# Patient Record
Sex: Male | Born: 1940 | Race: White | Hispanic: No | Marital: Married | Smoking: Former smoker
Health system: Southern US, Community
[De-identification: ages and names within clinical notes are randomized; demographics above are authoritative.]

## PROBLEM LIST (undated history)

## (undated) DIAGNOSIS — M545 Low back pain, unspecified: Secondary | ICD-10-CM

## (undated) DIAGNOSIS — M4150 Other secondary scoliosis, site unspecified: Secondary | ICD-10-CM

## (undated) DIAGNOSIS — F329 Major depressive disorder, single episode, unspecified: Secondary | ICD-10-CM

## (undated) DIAGNOSIS — T4145XA Adverse effect of unspecified anesthetic, initial encounter: Secondary | ICD-10-CM

## (undated) DIAGNOSIS — B370 Candidal stomatitis: Secondary | ICD-10-CM

## (undated) DIAGNOSIS — I10 Essential (primary) hypertension: Secondary | ICD-10-CM

## (undated) DIAGNOSIS — Z973 Presence of spectacles and contact lenses: Secondary | ICD-10-CM

## (undated) DIAGNOSIS — G8929 Other chronic pain: Secondary | ICD-10-CM

## (undated) DIAGNOSIS — Z889 Allergy status to unspecified drugs, medicaments and biological substances status: Secondary | ICD-10-CM

## (undated) DIAGNOSIS — T84030A 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, initial encounter: Secondary | ICD-10-CM

## (undated) DIAGNOSIS — R7303 Prediabetes: Secondary | ICD-10-CM

## (undated) DIAGNOSIS — C61 Malignant neoplasm of prostate: Secondary | ICD-10-CM

## (undated) DIAGNOSIS — M4180 Other forms of scoliosis, site unspecified: Secondary | ICD-10-CM

## (undated) DIAGNOSIS — M0520 Rheumatoid vasculitis with rheumatoid arthritis of unspecified site: Secondary | ICD-10-CM

## (undated) DIAGNOSIS — K219 Gastro-esophageal reflux disease without esophagitis: Secondary | ICD-10-CM

## (undated) DIAGNOSIS — E7800 Pure hypercholesterolemia, unspecified: Secondary | ICD-10-CM

## (undated) DIAGNOSIS — F32A Depression, unspecified: Secondary | ICD-10-CM

## (undated) DIAGNOSIS — Z9289 Personal history of other medical treatment: Secondary | ICD-10-CM

## (undated) DIAGNOSIS — J189 Pneumonia, unspecified organism: Secondary | ICD-10-CM

## (undated) DIAGNOSIS — Z9989 Dependence on other enabling machines and devices: Secondary | ICD-10-CM

## (undated) DIAGNOSIS — R0600 Dyspnea, unspecified: Secondary | ICD-10-CM

## (undated) DIAGNOSIS — G4733 Obstructive sleep apnea (adult) (pediatric): Secondary | ICD-10-CM

## (undated) DIAGNOSIS — Z87442 Personal history of urinary calculi: Secondary | ICD-10-CM

## (undated) DIAGNOSIS — J449 Chronic obstructive pulmonary disease, unspecified: Secondary | ICD-10-CM

## (undated) HISTORY — PX: PROSTATE BIOPSY: SHX241

## (undated) HISTORY — PX: COLONOSCOPY W/ POLYPECTOMY: SHX1380

## (undated) HISTORY — PX: BACK SURGERY: SHX140

## (undated) HISTORY — DX: Essential (primary) hypertension: I10

## (undated) HISTORY — DX: Allergy status to unspecified drugs, medicaments and biological substances: Z88.9

## (undated) HISTORY — PX: TONSILLECTOMY: SUR1361

## (undated) HISTORY — DX: Pure hypercholesterolemia, unspecified: E78.00

## (undated) HISTORY — PX: FRACTURE SURGERY: SHX138

## (undated) HISTORY — DX: Candidal stomatitis: B37.0

## (undated) HISTORY — PX: JOINT REPLACEMENT: SHX530

## (undated) HISTORY — PX: HERNIA REPAIR: SHX51

## (undated) HISTORY — DX: 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, initial encounter: T84.030A

---
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
## 1984-09-27 HISTORY — PX: INGUINAL HERNIA REPAIR: SUR1180

## 1998-06-17 ENCOUNTER — Encounter: Payer: Self-pay | Admitting: *Deleted

## 1998-06-17 ENCOUNTER — Ambulatory Visit (HOSPITAL_COMMUNITY): Admission: RE | Admit: 1998-06-17 | Discharge: 1998-06-17 | Payer: Self-pay | Admitting: *Deleted

## 1999-10-01 ENCOUNTER — Encounter (HOSPITAL_COMMUNITY): Admission: RE | Admit: 1999-10-01 | Discharge: 1999-12-30 | Payer: Self-pay

## 2000-01-11 ENCOUNTER — Other Ambulatory Visit: Admission: RE | Admit: 2000-01-11 | Discharge: 2000-01-11 | Payer: Self-pay | Admitting: *Deleted

## 2000-03-08 ENCOUNTER — Ambulatory Visit (HOSPITAL_COMMUNITY): Admission: RE | Admit: 2000-03-08 | Discharge: 2000-03-08 | Payer: Self-pay | Admitting: *Deleted

## 2000-03-08 ENCOUNTER — Encounter (INDEPENDENT_AMBULATORY_CARE_PROVIDER_SITE_OTHER): Payer: Self-pay | Admitting: *Deleted

## 2000-03-23 ENCOUNTER — Encounter: Admission: RE | Admit: 2000-03-23 | Discharge: 2000-03-23 | Payer: Self-pay | Admitting: *Deleted

## 2000-03-23 ENCOUNTER — Encounter: Payer: Self-pay | Admitting: *Deleted

## 2000-04-25 ENCOUNTER — Encounter: Payer: Self-pay | Admitting: Internal Medicine

## 2000-04-25 ENCOUNTER — Ambulatory Visit (HOSPITAL_BASED_OUTPATIENT_CLINIC_OR_DEPARTMENT_OTHER): Admission: RE | Admit: 2000-04-25 | Discharge: 2000-04-25 | Payer: Self-pay

## 2001-09-29 ENCOUNTER — Encounter: Admission: RE | Admit: 2001-09-29 | Discharge: 2001-09-29 | Payer: Self-pay | Admitting: Internal Medicine

## 2001-09-29 ENCOUNTER — Encounter: Payer: Self-pay | Admitting: Internal Medicine

## 2002-09-05 ENCOUNTER — Ambulatory Visit (HOSPITAL_COMMUNITY): Admission: RE | Admit: 2002-09-05 | Discharge: 2002-09-05 | Payer: Self-pay | Admitting: Internal Medicine

## 2002-09-05 ENCOUNTER — Encounter: Payer: Self-pay | Admitting: Internal Medicine

## 2002-09-07 ENCOUNTER — Encounter: Admission: RE | Admit: 2002-09-07 | Discharge: 2002-09-07 | Payer: Self-pay | Admitting: Internal Medicine

## 2002-09-07 ENCOUNTER — Encounter: Payer: Self-pay | Admitting: Internal Medicine

## 2003-01-25 ENCOUNTER — Encounter: Admission: RE | Admit: 2003-01-25 | Discharge: 2003-01-25 | Payer: Self-pay | Admitting: Internal Medicine

## 2003-01-25 ENCOUNTER — Encounter: Payer: Self-pay | Admitting: Internal Medicine

## 2003-04-03 ENCOUNTER — Encounter (INDEPENDENT_AMBULATORY_CARE_PROVIDER_SITE_OTHER): Payer: Self-pay | Admitting: Specialist

## 2003-04-03 ENCOUNTER — Ambulatory Visit (HOSPITAL_COMMUNITY): Admission: RE | Admit: 2003-04-03 | Discharge: 2003-04-03 | Payer: Self-pay | Admitting: Gastroenterology

## 2003-09-30 ENCOUNTER — Encounter: Admission: RE | Admit: 2003-09-30 | Discharge: 2003-09-30 | Payer: Self-pay | Admitting: Internal Medicine

## 2004-08-10 ENCOUNTER — Encounter: Admission: RE | Admit: 2004-08-10 | Discharge: 2004-08-10 | Payer: Self-pay | Admitting: Internal Medicine

## 2004-10-16 ENCOUNTER — Encounter: Admission: RE | Admit: 2004-10-16 | Discharge: 2004-10-16 | Payer: Self-pay | Admitting: Internal Medicine

## 2005-01-04 ENCOUNTER — Encounter: Admission: RE | Admit: 2005-01-04 | Discharge: 2005-01-04 | Payer: Self-pay | Admitting: Internal Medicine

## 2005-07-16 ENCOUNTER — Encounter: Admission: RE | Admit: 2005-07-16 | Discharge: 2005-07-16 | Payer: Self-pay | Admitting: Internal Medicine

## 2005-11-21 ENCOUNTER — Encounter: Admission: RE | Admit: 2005-11-21 | Discharge: 2005-11-21 | Payer: Self-pay | Admitting: Internal Medicine

## 2006-01-28 ENCOUNTER — Encounter: Admission: RE | Admit: 2006-01-28 | Discharge: 2006-01-28 | Payer: Self-pay | Admitting: Neurosurgery

## 2006-02-08 ENCOUNTER — Ambulatory Visit (HOSPITAL_COMMUNITY): Admission: RE | Admit: 2006-02-08 | Discharge: 2006-02-09 | Payer: Self-pay | Admitting: Neurosurgery

## 2008-03-14 DIAGNOSIS — J189 Pneumonia, unspecified organism: Secondary | ICD-10-CM | POA: Insufficient documentation

## 2008-03-14 DIAGNOSIS — G4733 Obstructive sleep apnea (adult) (pediatric): Secondary | ICD-10-CM | POA: Insufficient documentation

## 2008-03-14 DIAGNOSIS — G2589 Other specified extrapyramidal and movement disorders: Secondary | ICD-10-CM

## 2008-03-14 DIAGNOSIS — M069 Rheumatoid arthritis, unspecified: Secondary | ICD-10-CM | POA: Insufficient documentation

## 2008-03-14 DIAGNOSIS — Z9989 Dependence on other enabling machines and devices: Secondary | ICD-10-CM

## 2008-03-14 DIAGNOSIS — J449 Chronic obstructive pulmonary disease, unspecified: Secondary | ICD-10-CM

## 2008-03-15 ENCOUNTER — Ambulatory Visit: Payer: Self-pay | Admitting: Internal Medicine

## 2008-03-15 DIAGNOSIS — E785 Hyperlipidemia, unspecified: Secondary | ICD-10-CM | POA: Insufficient documentation

## 2008-03-23 DIAGNOSIS — J309 Allergic rhinitis, unspecified: Secondary | ICD-10-CM

## 2008-04-08 ENCOUNTER — Encounter: Payer: Self-pay | Admitting: Internal Medicine

## 2008-07-17 ENCOUNTER — Encounter: Admission: RE | Admit: 2008-07-17 | Discharge: 2008-07-17 | Payer: Self-pay | Admitting: Internal Medicine

## 2008-09-27 HISTORY — PX: POSTERIOR LAMINECTOMY / DECOMPRESSION LUMBAR SPINE: SUR740

## 2009-07-31 ENCOUNTER — Encounter: Admission: RE | Admit: 2009-07-31 | Discharge: 2009-07-31 | Payer: Self-pay | Admitting: Internal Medicine

## 2011-02-12 NOTE — Op Note (Signed)
   [name], [name]                        ACCOUNT NO.:  [id number]   MEDICAL RECORD NO.:  [id number]                   PATIENT TYPE:  AMB   LOCATION:  ENDO                                 FACILITY:  [facility]   PHYSICIAN:  Earle Gell, M.D.                DATE OF BIRTH:  25-May-1941   DATE OF PROCEDURE:  04/03/2003  DATE OF DISCHARGE:                                 OPERATIVE REPORT   PROCEDURE:  Colonoscopy.   PROCEDURE INDICATION:  [name] is a 70 year old male, born  [birth year].  [name] is undergoing a diagnostic colonoscopy to  evaluate guaiac positive stool.   ENDOSCOPIST:  Garlan Fair, M.D.   PREMEDICATION:  1. Versed 5 mg.  2. Demerol 50 mg.   DESCRIPTION OF PROCEDURE:  After obtaining informed consent, [name] was  placed in the left lateral decubitus position.  I administered intravenous  Demerol and intravenous Versed to achieve conscious sedation for the  procedure.  The patient's blood pressure, oxygen saturation, and cardiac  rhythm were monitored throughout the procedure and documented in the medical  record.   Anal inspection was normal.  Digital rectal examination revealed a  nonnodular prostate.  The Olympus adult colonoscope was introduced into the  rectum and advanced to the cecum.  A normal-appearing ileocecal valve was  intubated and the distal ileum inspected.  Colonic preparation for the exam  today was excellent.   RECTUM:  Normal.  SIGMOID COLON AND DESCENDING COLON:  Left colonic diverticulosis without  diverticulitis or diverticular stricture formation.  SPLENIC FLEXURE:  Normal.  TRANSVERSE COLON:  Normal.  HEPATIC FLEXURE:  A 0.5 mm sessile polyp was removed with the cold biopsy  forceps.  ASCENDING COLON:  Normal.  CECUM AND ILEOCECAL VALVE:  Normal.  DISTAL ILEUM:  Normal.   ASSESSMENT:  1. Left colonic diverticulosis.  2. A 0.5 mm sessile polyp was removed from the hepatic flexure.   RECOMMENDATIONS:  Repeat colonoscopy in five years if hepatic flexure polyp  returns neoplastic pathologically.                                               Earle Gell, M.D.    MJ/MEDQ  D:  04/03/2003  T:  04/03/2003  Job:  [id number]   cc:   Doree Albee, M.D.  [place] [address], [address]  [place]  [place] [address]  Fax: [phone]   Michael Litter, M.D.  [address] [place]  [place] [address]  Fax: Drummond Terance Hart, M.D.  [place]. [address] [place]  [place] [address]  Fax: [phone]

## 2011-02-12 NOTE — Procedures (Signed)
[facility]  Patient:    [name], [name]                     MRN: [id number] Proc. Date: 03/08/00 Adm. Date:  [id number] Disc. Date: [id number] Attending:  Ardeth Perfect CC:         Dennie Bible. Drue Flirt, M.D.                           Procedure Report  PROCEDURE PERFORMED:  Video colonoscopy.  ENDOSCOPIST:  Knox Saliva, M.D.  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  PREPARATION:  He is n.p.o. since midnight having taken Phospho-Soda prep and a clear liquid diet yesterday.  The mucosa is clean.  DEPTH OF INSERTION:  Only midtransverse.  PREPROCEDURE SEDATION:   The patient received 125 mg Demerol and 10 mg of Versed intravenously.  In addition he was on 2L of nasal cannula O2.  DESCRIPTION OF PROCEDURE:  The Olympus video colonoscope was inserted via the rectum and advanced fairly easily to the midtransverse colon.  Beyond this point the patient could not tolerate further passage despite the administration of the final 25 mg of Demerol listed above.  He has had a prior splenectomy and has a midline abdominal scar and there probably are adhesions limiting the insertion at this point.  Extra-abdominal pressure and rotating the patient on his back were attempted to no avail.  The insertion was terminated and the scope was withdrawn.  On withdrawal, the mucosa was carefully evaluated and from mid to transverse to sigmoid appeared completely normal with the exception of mild diverticulosis.  In the rectum was a 0.6 cm polyp that was snared and recovered for histologic evaluation.  No other abnormalities were seen.  The patient tolerated the procedure well.  Pulse, bp and oximetry testing were stable throughout.  He was observed in recovery for 45 minutes and discharged home alert with a benign abdomen.ks were identified  IMPRESSION: 1. Rectal polyp. 2. Mild sigmoid  diverticulosis. 3. Incomplete colonoscopy probably secondary to adhesions from prior surgery.  PLAN:  Patient will require a full air contrast barium enema which I will schedule through my office.  RECOMMENDATIONS: DD:  03/08/00 TD:  03/10/00 Job: [id number]

## 2011-02-12 NOTE — Op Note (Signed)
[name], [name]              ACCOUNT NO.:  [id number]   MEDICAL RECORD NO.:  [id number]          PATIENT TYPE:  AMB   LOCATION:  SDS                          FACILITY:  [facility]   PHYSICIAN:  Faythe Ghee, M.D. 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   PROCEDURES:  1.  Right L2-3 and left L4-5 interlaminar laminotomy for excision of      herniated disk with operating microscope.  2.  Microdissection, L2-3 disk on the right with L3 nerve root on the right      and L4-5 disk on the left with L5 root on the left.   SURGEON:  Faythe Ghee, M.D.   ASSISTANT:  Cooper Render. Pool, M.D.   PROCEDURE IN DETAIL:  After being placed in the prone position, the  patient's back was prepped and draped in the usual sterile fashion.  A  localizing x-ray was taken prior to incision to identify the appropriate  level.  A midline incision was made below the spinous processes of L2-3 and  L4-5.  Using Bovie cutting current, the incision was carried out into the  spinous processes.  Subperiosteal dissection was then carried out on the  right side at L2-3 until the spinous processes and laminae had been exposed.  Subperiosteal dissection was then carried out at L4-5 on the left side to  expose the lamina.  Self-retaining retractor was then placed at both levels  and an x-ray was taken to confirm approach to the appropriate level.  Starting at L4-5 on the left, a high-speed drill was used to remove the  inferior one-third of the L4 lamina, the medial one-third of the facet joint  and the superior one-third of the L5 lamina.  Residual bone and ligamentum  flavum were removed in a piecemeal fashion.  A similar laminotomy was then  performed at L2-3 on the right by removing  the inferior one-third of the L2  lamina, the medial one-third of the L2 lamina, the medial one-third of the  facet joint and the superior one-half of the L3 lamina.  Once again residual  bone and ligamentum flavum were removed in a piecemeal fashion.  The  microscope was then draped, brought into the field and used for the  remainder of the case.  Starting at L2-3, microdissection technique was used  to identify the L3 nerve root and the L2-3 disk.  Inferior to the disk along  the pedicle, there was a large amount of inferior herniated disk material,  which was removed in a piecemeal fashion.  This gave excellent decompression  to the L3 nerve root.  The disk at L2-3 was then incised and thoroughly  cleaned out with pituitary rongeurs and curettes.  At this time inspection  was carried out in all directions for any evidence of residual compression  and none could be identified.  Large  amounts of irrigation were carried out.  Attention was then turned to L4-5 on the left.  Once again microdissection  technique was used to identify the L5 nerve root and L4-5 disk, which was  found to be herniated beneath the nerve root.  Once again the disk space was  incised and thoroughly cleaned out with pituitary rongeurs and curettes.  A  small amount of residual disk material was found inferior to the disk space,  and this was removed and gave excellent decompression to the L5 nerve root.  At this time inspection was carried out at this level for any evidence of  residual compression, and none could be identified.  Both levels were  irrigated one more.  Any bleeding  was controlled with bipolar coagulation and Gelfoam.  The wounds were then  closed with multiple layers of Vicryl on the muscle, fascia, subcutaneous  and subcuticular tissues, and staples were placed on the skin.  Sterile  dressings were then applied and the patient was extubated and taken to the  recovery room in stable  condition.           ______________________________  Faythe Ghee, M.D.     ROK/MEDQ  D:  02/08/2006  T:  02/08/2006  Job:  [id number]

## 2015-10-29 HISTORY — PX: TOTAL HIP ARTHROPLASTY: SHX124

## 2016-04-27 HISTORY — PX: HIP ARTHROPLASTY: SHX981

## 2016-05-28 HISTORY — PX: UMBILICAL HERNIA REPAIR: SHX196

## 2016-10-29 ENCOUNTER — Other Ambulatory Visit: Payer: Self-pay | Admitting: Neurosurgery

## 2016-10-29 DIAGNOSIS — M48062 Spinal stenosis, lumbar region with neurogenic claudication: Secondary | ICD-10-CM

## 2016-11-04 ENCOUNTER — Ambulatory Visit
Admission: RE | Admit: 2016-11-04 | Discharge: 2016-11-04 | Disposition: A | Discharge status: Home / Self Care | Payer: Medicare Other | Source: Ambulatory Visit | Attending: Neurosurgery | Admitting: Neurosurgery

## 2016-11-04 DIAGNOSIS — M48062 Spinal stenosis, lumbar region with neurogenic claudication: Secondary | ICD-10-CM

## 2016-11-04 MED ORDER — ONDANSETRON HCL 4 MG/2ML IJ SOLN: 4.0000 mg | Freq: Four times a day (QID) | INTRAMUSCULAR | Status: DC | PRN

## 2016-11-04 MED ORDER — IOPAMIDOL (ISOVUE-M 200) INJECTION 41%
15.0000 mL | Freq: Once | INTRAMUSCULAR | Status: AC
  Administered 2016-11-04: 15 mL via INTRATHECAL

## 2016-11-04 MED ORDER — DIAZEPAM 5 MG PO TABS
5.0000 mg | ORAL_TABLET | Freq: Once | ORAL | Status: AC
  Administered 2016-11-04: 5 mg via ORAL

## 2016-11-04 NOTE — Progress Notes (Signed)
Patient states he has been off Duloxetine, Tramadol and Trazodone for at least the past two days.  jkl

## 2016-11-04 NOTE — Discharge Instructions (Signed)
Myelogram Discharge Instructions  1. Go home and rest quietly for the next 24 hours.  It is important to lie flat for the next 24 hours.  Get up only to go to the restroom.  You may lie in the bed or on a couch on your back, your stomach, your left side or your right side.  You may have one pillow under your head.  You may have pillows between your knees while you are on your side or under your knees while you are on your back.  2. DO NOT drive today.  Recline the seat as far back as it will go, while still wearing your seat belt, on the way home.  3. You may get up to go to the bathroom as needed.  You may sit up for 10 minutes to eat.  You may resume your normal diet and medications unless otherwise indicated.  Drink plenty of extra fluids today and tomorrow.  4. The incidence of a spinal headache with nausea and/or vomiting is about 5% (one in 20 patients).  If you develop a headache, lie flat and drink plenty of fluids until the headache goes away.  Caffeinated beverages may be helpful.  If you develop severe nausea and vomiting or a headache that does not go away with flat bed rest, call [phone].  5. You may resume normal activities after your 24 hours of bed rest is over; however, do not exert yourself strongly or do any heavy lifting tomorrow.  6. Call your physician for a follow-up appointment.    You may resume Duloxetine, Tramadol and Trazodone on Friday, February, 9, 2018 after 9:30a.m.

## 2016-11-17 ENCOUNTER — Encounter: Payer: Self-pay | Admitting: Pulmonary Disease

## 2016-11-17 ENCOUNTER — Ambulatory Visit (INDEPENDENT_AMBULATORY_CARE_PROVIDER_SITE_OTHER): Payer: Medicare Other | Admitting: Pulmonary Disease

## 2016-11-17 DIAGNOSIS — Z9081 Acquired absence of spleen: Secondary | ICD-10-CM | POA: Insufficient documentation

## 2016-11-17 DIAGNOSIS — F17210 Nicotine dependence, cigarettes, uncomplicated: Secondary | ICD-10-CM | POA: Diagnosis not present

## 2016-11-17 DIAGNOSIS — J42 Unspecified chronic bronchitis: Secondary | ICD-10-CM | POA: Diagnosis not present

## 2016-11-17 NOTE — Assessment & Plan Note (Signed)
He has moderate airflow obstruction as seen on October 2015 or function testing performed in [place].  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  He is at increased risk for recurrent bacterial infections considering the fact that he had a splenectomy.   Plan: Continue Breo Continue Spiriva Immunizations are up-to-date Albuterol as needed

## 2016-11-17 NOTE — Assessment & Plan Note (Signed)
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 He tells me that he has had both pneumonia vaccines.

## 2016-11-17 NOTE — Patient Instructions (Signed)
Keep taking your Breo and Spiriva as you are doing  Keep using CPAP every night We will plan on seeing you back in 6 months or sooner if needed We will refer you to the lung cancer screening program

## 2016-11-17 NOTE — Addendum Note (Signed)
Addended by: Len Blalock on: 11/17/2016 04:32 PM   Modules accepted: Orders

## 2016-11-17 NOTE — Progress Notes (Signed)
Subjective:    [name], male    DOB: [birth year], 76 y.o.   MRN: [id number]  Synopsis: Referred for COPD in 2018. He has a history of tobacco repeat abuse and smoked 2 packs of cigarettes daily for 52 years, quit in October 2016. 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 He also has rheumatoid arthritis.  HPI Chief Complaint  Patient presents with  . Pulmonary Consult    dyspnea, from [place], [place] and needs to establish care, dx'd with COPD from previous pulmonologist, dx'd with sleep apnea, uses CPAP, doesnt have a DME here,     [name] is here to establish care with a pulmonary doctors.  He feels like overall his care has been OK.  He tells me that he is currently taking antibiotics for pneumonia.  He was diagnosed with this in [facility] after he saw his PCP who ordered a CXR.  He is on day 6 of 10 of antibiotics.  He is also taking prednisone as well.  He feels tired, but much better than he was 3 days ago.   He quit smoking in 2016 after smoking 2 ppd for 52 years.    No hospitalizations for COPD in the past, though he has had pneumonia several times.  He has had both forms of the pneumonia vaccine, he believes.    [place] live in an assisted living facility.  He has had a sinus infection since moving here and was treated with an antibiotic. He has persistent sinus congestion.  He uses CPAP nightly, it helps him rest well and he doesn't feel fatigued.  He has used it for 10-12 years, never needed the pressure adjusted.   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  He takes spiriva and breo.     Past Medical History:  Diagnosis Date  . Cancer of prostate ([place])   . H/O seasonal allergies   . High cholesterol   . Hypertension   . Sleep apnea      Family History  Problem Relation Age of Onset  . Cancer Mother   . Heart disease Father      Social History   Social History  . Marital status: Married    Spouse  name: N/A  . Number of children: N/A  . Years of education: N/A   Occupational History  . Not on file.   Social History Main Topics  . Smoking status: Former Smoker    Types: Cigarettes    Quit date: 09/27/2014  . Smokeless tobacco: Never Used  . Alcohol use Not on file  . Drug use: Unknown  . Sexual activity: Not on file   Other Topics Concern  . Not on file   Social History Narrative  . No narrative on file     Allergies  Allergen Reactions  . Sulfa Antibiotics      Outpatient Medications Prior to Visit  Medication Sig Dispense Refill  . DULoxetine (CYMBALTA) 60 MG capsule Take 60 mg by mouth daily.    . fluticasone furoate-vilanterol (BREO ELLIPTA) 100-25 MCG/INH AEPB Inhale 1 puff into the lungs daily.    . folic acid (FOLVITE) 1 MG tablet Take 1 mg by mouth daily.    Marland Kitchen gabapentin (NEURONTIN) 300 MG capsule Take 600 mg by mouth 3 (three) times daily.    . hydroxychloroquine (PLAQUENIL) 200 MG tablet Take 200 mg by mouth daily.    Marland Kitchen  ibuprofen (ADVIL,MOTRIN) 400 MG tablet Take 400 mg by mouth 3 (three) times daily.    . InFLIXimab (REMICADE IV) Inject into the vein.    Marland Kitchen lisinopril (PRINIVIL,ZESTRIL) 10 MG tablet Take 10 mg by mouth daily.    . methotrexate 2.5 MG tablet Take 20 mg by mouth once a week.    . metoprolol tartrate (LOPRESSOR) 25 MG tablet Take 25 mg by mouth 2 (two) times daily.    . prednisoLONE 5 MG TABS tablet Take by mouth daily.    . simvastatin (ZOCOR) 40 MG tablet Take 40 mg by mouth daily at 6 PM.    . tamsulosin (FLOMAX) 0.4 MG CAPS capsule Take 0.4 mg by mouth daily.    . Tiotropium Bromide Monohydrate (SPIRIVA RESPIMAT) 2.5 MCG/ACT AERS Inhale 2 puffs into the lungs daily.    . traMADol (ULTRAM) 50 MG tablet Take 50 mg by mouth every 6 (six) hours as needed.    . traZODone (DESYREL) 50 MG tablet Take 50 mg by mouth at bedtime.     No facility-administered medications prior to visit.       Review of Systems  Constitutional: Positive for  fatigue. Negative for chills and fever.  HENT: Negative for nosebleeds, postnasal drip, rhinorrhea, sinus pain and sinus pressure.   Respiratory: Positive for cough and shortness of breath. Negative for wheezing.   Cardiovascular: Positive for leg swelling. Negative for chest pain and palpitations.  Gastrointestinal: Negative for abdominal pain, constipation and diarrhea.  Endocrine: Negative for polydipsia and polyphagia.  Genitourinary: Negative for frequency and urgency.  Musculoskeletal: Positive for arthralgias and back pain. Negative for joint swelling and myalgias.  Neurological: Negative for seizures, speech difficulty, light-headedness and headaches.       Objective:   Physical Exam Vitals:   11/17/16 1548  BP: 122/72  Pulse: 87  SpO2: 92%  Weight: 241 lb (109.3 kg)  Height: _0  (1.727 m)   RA  Gen: obese, chronically ill appearing, no acute distress HENT: NCAT, OP clear, neck supple without masses Eyes: PERRL, EOMi Lymph: no cervical lymphadenopathy PULM: Crackles R base, normal effort CV: RRR, no mgr, no JVD GI: BS+, soft, nontender, no hsm Derm: no rash or skin breakdown MSK: normal bulk and tone Neuro: A&Ox4, CN II-XII intact, strength 5/5 in all 4 extremities Psyche: normal mood and affect   [facility] [place] reviewed: He was listed as having rheumatoid arthritis and COPD and sleep apnea. Apparently he quit smoking in 2017 and then used electronic cigarettes afterwards  Pulmonary function testing: October 2015 from [facility] ratio 58% FEV1 1.54 L 57% predicted, FVC 2.65 L 70% predicted, total lung capacity 157% predicted residual volume 263% predicted DLCO 61% predicted     Assessment & Plan:  Cigarette smoker He smoked 2 packs of cigarettes daily for 52 years and quit in 2016. He is at increased risk for lung cancer. I encouraged him to stay way from cigarettes. Avoid vaping. Will refer to lung  cancer screening program.  COPD (chronic obstructive pulmonary disease) ([place]) He has moderate airflow obstruction as seen on October 2015 or function testing performed in [place].  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  He is at increased risk for recurrent bacterial infections considering the fact that he had a splenectomy.   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 He tells me that he has had both pneumonia vaccines.    Current Outpatient Prescriptions:  .  DULoxetine (CYMBALTA) 60 MG capsule, Take 60 mg by mouth daily., Disp: , Rfl:  .  fluticasone furoate-vilanterol (BREO ELLIPTA) 100-25 MCG/INH AEPB, Inhale 1 puff into the lungs daily., Disp: , Rfl:  .  folic acid (FOLVITE) 1 MG tablet, Take 1 mg by mouth daily., Disp: , Rfl:  .  gabapentin (NEURONTIN) 300 MG capsule, Take 600 mg by mouth 3 (three) times daily., Disp: , Rfl:  .  hydroxychloroquine (PLAQUENIL) 200 MG tablet, Take 200 mg by mouth daily., Disp: , Rfl:  .  ibuprofen (ADVIL,MOTRIN) 400 MG tablet, Take 400 mg by mouth 3 (three) times daily., Disp: , Rfl:  .  InFLIXimab (REMICADE IV), Inject into the vein., Disp: , Rfl:  .  lisinopril (PRINIVIL,ZESTRIL) 10 MG tablet, Take 10 mg by mouth daily., Disp: , Rfl:  .  methotrexate 2.5 MG tablet, Take 20 mg by mouth once a week., Disp: , Rfl:  .  metoprolol tartrate (LOPRESSOR) 25 MG tablet, Take 25 mg by mouth 2 (two) times daily., Disp: , Rfl:  .  prednisoLONE 5 MG TABS tablet, Take by mouth daily., Disp: , Rfl:  .  simvastatin (ZOCOR) 40 MG tablet, Take 40 mg by mouth daily at 6 PM., Disp: , Rfl:  .  tamsulosin (FLOMAX) 0.4 MG CAPS capsule, Take 0.4 mg by mouth daily., Disp: , Rfl:  .  Tiotropium Bromide Monohydrate (SPIRIVA RESPIMAT) 2.5 MCG/ACT AERS, Inhale 2 puffs into the lungs daily., Disp: , Rfl:  .   traMADol (ULTRAM) 50 MG tablet, Take 50 mg by mouth every 6 (six) hours as needed., Disp: , Rfl:  .  traZODone (DESYREL) 50 MG tablet, Take 50 mg by mouth at bedtime., Disp: , Rfl:

## 2016-11-17 NOTE — Assessment & Plan Note (Signed)
He smoked 2 packs of cigarettes daily for 52 years and quit in 2016. He is at increased risk for lung cancer. I encouraged him to stay way from cigarettes. Avoid vaping. Will refer to lung cancer screening program.

## 2016-11-19 ENCOUNTER — Telehealth: Payer: Self-pay | Admitting: Acute Care

## 2016-11-23 NOTE — Telephone Encounter (Signed)
Spoke with pt and scheduled for [facility] 12/13/16 at 3:00 CT ordered Nothing further needed

## 2016-12-13 ENCOUNTER — Ambulatory Visit (INDEPENDENT_AMBULATORY_CARE_PROVIDER_SITE_OTHER): Payer: Medicare Other | Admitting: Acute Care

## 2016-12-13 ENCOUNTER — Encounter: Payer: Self-pay | Admitting: Acute Care

## 2016-12-13 ENCOUNTER — Other Ambulatory Visit: Payer: Self-pay | Admitting: Acute Care

## 2016-12-13 DIAGNOSIS — Z87891 Personal history of nicotine dependence: Secondary | ICD-10-CM | POA: Diagnosis not present

## 2016-12-13 NOTE — Progress Notes (Signed)
Shared Decision Making Visit Lung Cancer Screening Program [phone])   Eligibility:  Age 76 y.o.  Pack Years Smoking History Calculation 81 pack year smoking history (# packs/per year x # years smoked)  Recent History of coughing up blood  no  Unexplained weight loss? no ( >Than 15 pounds within the last 6 months )  Prior History Lung / other cancer no (Diagnosis within the last 5 years already requiring surveillance chest CT Scans).  Smoking Status Former Smoker  Former Smokers: Years since quit: 2 years  Quit Date: 2016  Visit Components:  Discussion included one or more decision making aids. yes  Discussion included risk/benefits of screening. yes  Discussion included potential follow up diagnostic testing for abnormal scans. yes  Discussion included meaning and risk of over diagnosis. yes  Discussion included meaning and risk of False Positives. yes  Discussion included meaning of total radiation exposure. yes  Counseling Included:  Importance of adherence to annual lung cancer LDCT screening. yes  Impact of comorbidities on ability to participate in the program. yes  Ability and willingness to under diagnostic treatment. yes  Smoking Cessation Counseling:  Current Smokers:   Discussed importance of smoking cessation. no  Information about tobacco cessation classes and interventions provided to patient. yes  Patient provided with "ticket" for LDCT Scan. yes  Symptomatic Patient. no  Counseling  Diagnosis Code: Tobacco Use Z72.0  Asymptomatic Patient yes  Counseling (Intermediate counseling: > three minutes counseling) G9562  Former Smokers:   Discussed the importance of maintaining cigarette abstinence. yes  Diagnosis Code: Personal History of Nicotine Dependence. Z30.865  Information about tobacco cessation classes and interventions provided to patient. Yes  Patient provided with "ticket" for LDCT Scan. yes  Written Order for Lung Cancer  Screening with LDCT placed in Epic. Yes (CT Chest Lung Cancer Screening Low Dose W/O CM) [id number] Z12.2-Screening of respiratory organs Z87.891-Personal history of nicotine dependence    I spent 25 minutes of face to face time with [name] discussing the risks and benefits of lung cancer screening. We viewed a power point together that explained in detail the above noted topics. We took the time to pause the power point at intervals to allow for questions to be asked and answered to ensure understanding. We discussed that he had taken the single most powerful action possible to decrease his risk of developing lung cancer when he quit smoking. I counseled him to remain smoke free, and to contact me if he ever had the desire to smoke again so that I can provide resources and tools to help support the effort to remain smoke free. We discussed the time and location of the scan, and that either Doroteo Glassman RN or I will call with the results within  24-48 hours of receiving them. He was offered   my card and contact information in the event he needs to speak with me, in addition to a copy of the power point we reviewed as a resource. He verbalized understanding of all of the above and had no further questions upon leaving the office.   We discussed that there has been a high incidence of CAD noted on these scans. I explained that in this non-gated exam degree or severity cannot be determined.He is currently on statin therapy per his PCP , and states that this is not a new diagnosis for him. He verbalized understanding of th above.  We discussed continued smoking cessation for 2-3 minutes of this appointment  [name]  Michael Confer, NP 12/13/2016

## 2016-12-16 ENCOUNTER — Ambulatory Visit (INDEPENDENT_AMBULATORY_CARE_PROVIDER_SITE_OTHER): Payer: Medicare Other

## 2016-12-16 DIAGNOSIS — Z122 Encounter for screening for malignant neoplasm of respiratory organs: Secondary | ICD-10-CM | POA: Diagnosis not present

## 2016-12-16 DIAGNOSIS — I700 Atherosclerosis of aorta: Secondary | ICD-10-CM

## 2016-12-16 DIAGNOSIS — Z87891 Personal history of nicotine dependence: Secondary | ICD-10-CM | POA: Diagnosis not present

## 2016-12-22 ENCOUNTER — Other Ambulatory Visit: Payer: Self-pay | Admitting: Acute Care

## 2016-12-22 ENCOUNTER — Other Ambulatory Visit: Payer: Self-pay | Admitting: Neurosurgery

## 2016-12-22 DIAGNOSIS — Z87891 Personal history of nicotine dependence: Secondary | ICD-10-CM

## 2017-01-17 ENCOUNTER — Telehealth: Payer: Self-pay | Admitting: Pulmonary Disease

## 2017-01-17 NOTE — Telephone Encounter (Signed)
lmom tcb x1

## 2017-01-18 MED ORDER — TIOTROPIUM BROMIDE MONOHYDRATE 2.5 MCG/ACT IN AERS: 2.0000 | INHALATION_SPRAY | Freq: Every day | RESPIRATORY_TRACT | 3 refills | Status: DC

## 2017-01-18 MED ORDER — FLUTICASONE FUROATE-VILANTEROL 100-25 MCG/INH IN AEPB: 1.0000 | INHALATION_SPRAY | Freq: Every day | RESPIRATORY_TRACT | 3 refills | Status: DC

## 2017-01-18 NOTE — Telephone Encounter (Signed)
Rx sent to preferred pharmacy for 90 day supply. Pt aware and voiced his understanding. Nothing further needed.

## 2017-01-18 NOTE — Telephone Encounter (Signed)
Patient returned call [phone].  States the message left he could not understand as it was "broken up". He will leave at 9:20 and be back later on. May try leaving message again if cannot reach him.

## 2017-01-25 DIAGNOSIS — T8859XA Other complications of anesthesia, initial encounter: Secondary | ICD-10-CM

## 2017-01-25 HISTORY — DX: Other complications of anesthesia, initial encounter: T88.59XA

## 2017-02-08 NOTE — Pre-Procedure Instructions (Signed)
[name]  02/08/2017      [facility] - [place], [place] - [address] [place] [address] Phone: [phone] Fax: [phone]    Your procedure is scheduled on Thurs. May 24  Report to [facility] Admitting at 5:30 A.M.  Call this number if you have problems the morning of surgery:  [phone]   Remember:  Do not eat food or drink liquids after midnight on Wed. May 23   Take these medicines the morning of surgery with A SIP OF WATER : albuterol inhaler if needed-bring to hospital,cymbalta, finasteride (proscar), flonase nasal spray, breo-bring to hospital, gabapentin (neurontin), hydroxychloroquine (plaquenil), omeprazole (prilosec), prednisolone, sodium chloride nasal spray, tamsulosin (flomax), spiriva-bring to hospital, tramadol if needed            1 week prior to surgery stop:aspirin, advil, aleve, ibuprofen, motrin, BC Powders, Goody's, vitamins/herbal medicines.   Do not wear jewelry.  Do not wear lotions, powders, or perfumes, or deoderant.  Do not shave 48 hours prior to surgery.  Men may shave face and neck.  Do not bring valuables to the hospital.  [facility] is not responsible for any belongings or valuables.  Contacts, dentures or bridgework may not be worn into surgery.  Leave your suitcase in the car.  After surgery it may be brought to your room.  For patients admitted to the hospital, discharge time will be determined by your treatment team.  Patients discharged the day of surgery will not be allowed to drive home.    Special instructions:   - Preparing For Surgery  Before surgery, you can play an important role. Because skin is not sterile, your skin needs to be as free of germs as possible. You can reduce the number of germs on your skin by washing with CHG (chlorahexidine gluconate) Soap before surgery.  CHG is an antiseptic cleaner which kills germs and bonds with the skin to continue  killing germs even after washing.  Please do not use if you have an allergy to CHG or antibacterial soaps. If your skin becomes reddened/irritated stop using the CHG.  Do not shave (including legs and underarms) for at least 48 hours prior to first CHG shower. It is OK to shave your face.  Please follow these instructions carefully.   1. Shower the NIGHT BEFORE SURGERY and the MORNING OF SURGERY with CHG.   2. If you chose to wash your hair, wash your hair first as usual with your normal shampoo.  3. After you shampoo, rinse your hair and body thoroughly to remove the shampoo.  4. Use CHG as you would any other liquid soap. You can apply CHG directly to the skin and wash gently with a scrungie or a clean washcloth.   5. Apply the CHG Soap to your body ONLY FROM THE NECK DOWN.  Do not use on open wounds or open sores. Avoid contact with your eyes, ears, mouth and genitals (private parts). Wash genitals (private parts) with your normal soap.  6. Wash thoroughly, paying special attention to the area where your surgery will be performed.  7. Thoroughly rinse your body with warm water from the neck down.  8. DO NOT shower/wash with your normal soap after using and rinsing off the CHG Soap.  9. Pat yourself dry with a CLEAN TOWEL.   10. Wear CLEAN PAJAMAS   11. Place CLEAN SHEETS on your bed the night of your  first shower and DO NOT SLEEP WITH PETS.    Day of Surgery: Do not apply any deodorants/lotions. Please wear clean clothes to the hospital/surgery center.      Please read over the following fact sheets that you were given. Coughing and Deep Breathing, MRSA Information and Surgical Site Infection Prevention

## 2017-02-09 ENCOUNTER — Encounter (HOSPITAL_COMMUNITY): Payer: Self-pay

## 2017-02-09 ENCOUNTER — Encounter (HOSPITAL_COMMUNITY)
Admission: RE | Admit: 2017-02-09 | Discharge: 2017-02-09 | Disposition: A | Discharge status: Home / Self Care | Payer: Medicare Other | Source: Ambulatory Visit | Attending: Neurosurgery | Admitting: Neurosurgery

## 2017-02-09 DIAGNOSIS — Z01818 Encounter for other preprocedural examination: Secondary | ICD-10-CM | POA: Insufficient documentation

## 2017-02-09 DIAGNOSIS — Z01812 Encounter for preprocedural laboratory examination: Secondary | ICD-10-CM | POA: Diagnosis not present

## 2017-02-09 DIAGNOSIS — I444 Left anterior fascicular block: Secondary | ICD-10-CM | POA: Diagnosis not present

## 2017-02-09 DIAGNOSIS — Z0183 Encounter for blood typing: Secondary | ICD-10-CM | POA: Diagnosis not present

## 2017-02-09 DIAGNOSIS — M419 Scoliosis, unspecified: Secondary | ICD-10-CM | POA: Insufficient documentation

## 2017-02-09 HISTORY — DX: Gastro-esophageal reflux disease without esophagitis: K21.9

## 2017-02-09 HISTORY — DX: Chronic obstructive pulmonary disease, unspecified: J44.9

## 2017-02-09 HISTORY — DX: Major depressive disorder, single episode, unspecified: F32.9

## 2017-02-09 HISTORY — DX: Personal history of urinary calculi: Z87.442

## 2017-02-09 HISTORY — DX: Rheumatoid vasculitis with rheumatoid arthritis of unspecified site: M05.20

## 2017-02-09 HISTORY — DX: Dyspnea, unspecified: R06.00

## 2017-02-09 HISTORY — DX: Depression, unspecified: F32.A

## 2017-02-09 LAB — TYPE AND SCREEN
ABO/RH(D): A POS
ANTIBODY SCREEN: NEGATIVE

## 2017-02-09 LAB — BASIC METABOLIC PANEL
Anion gap: 7 (ref 5–15)
BUN: 10 mg/dL (ref 6–20)
CALCIUM: 9.1 mg/dL (ref 8.9–10.3)
CO2: 29 mmol/L (ref 22–32)
CREATININE: 0.96 mg/dL (ref 0.61–1.24)
Chloride: 100 mmol/L — ABNORMAL LOW (ref 101–111)
GFR calc Af Amer: >60 mL/min (ref >60)
Glucose, Bld: 108 mg/dL — ABNORMAL HIGH (ref 65–99)
Potassium: 4.4 mmol/L (ref 3.5–5.1)
SODIUM: 136 mmol/L (ref 135–145)

## 2017-02-09 LAB — SURGICAL PCR SCREEN
MRSA, PCR: NEGATIVE
STAPHYLOCOCCUS AUREUS: NEGATIVE

## 2017-02-09 LAB — CBC
HCT: 40.6 % (ref 39.0–52.0)
Hemoglobin: 13.6 g/dL (ref 13.0–17.0)
MCH: 37 pg — AB (ref 26.0–34.0)
MCHC: 33.5 g/dL (ref 30.0–36.0)
MCV: 110.3 fL — ABNORMAL HIGH (ref 78.0–100.0)
PLATELETS: 275 10*3/uL (ref 150–400)
RBC: 3.68 MIL/uL — AB (ref 4.22–5.81)
RDW: 14.3 % (ref 11.5–15.5)
WBC: 9.7 10*3/uL (ref 4.0–10.5)

## 2017-02-09 LAB — ABO/RH: ABO/RH(D): A POS

## 2017-02-09 NOTE — Progress Notes (Signed)
PCP: Dr. Antony Salmon @ [address] in [place] (pt. Relocated Jan. 2018 from [place], [place] Pulm: Dr. Simonne Maffucci  Last sleep study>10 yrs.  Will request ekg/notes from new [facility]--Dr. Richarda Blade in [place], [place]

## 2017-02-16 NOTE — Anesthesia Preprocedure Evaluation (Addendum)
Anesthesia Evaluation  Patient identified by MRN, date of birth, ID band Patient awake    Reviewed: Allergy & Precautions, H&P , Patient's Chart, lab work & pertinent test results, reviewed documented beta blocker date and time   Airway Mallampati: II  TM Distance: >3 FB Neck ROM: full    Dental no notable dental hx. (+) Teeth Intact, Dental Advisory Given   Pulmonary shortness of breath and with exertion, sleep apnea and Continuous Positive Airway Pressure Ventilation , COPD,  COPD inhaler, former smoker,    Pulmonary exam normal breath sounds clear to auscultation       Cardiovascular hypertension, Pt. on medications and Pt. on home beta blockers  Rhythm:regular Rate:Normal     Neuro/Psych PSYCHIATRIC DISORDERS Depression    GI/Hepatic   Endo/Other  Morbid obesity  Renal/GU      Musculoskeletal  (+) Arthritis , Rheumatoid disorders,    Abdominal   Peds  Hematology   Anesthesia Other Findings   Reproductive/Obstetrics                           Anesthesia Physical Anesthesia Plan  ASA: II  Anesthesia Plan: General   Post-op Pain Management:    Induction: Intravenous  Airway Management Planned: Oral ETT  Additional Equipment:   Intra-op Plan:   Post-operative Plan: Extubation in OR  Informed Consent: I have reviewed the patients History and Physical, chart, labs and discussed the procedure including the risks, benefits and alternatives for the proposed anesthesia with the patient or authorized representative who has indicated his/her understanding and acceptance.   Dental Advisory Given  Plan Discussed with: CRNA and Surgeon  Anesthesia Plan Comments: ( IV x 2; consider a-line for blood draws  Renew T&S if expired )        Anesthesia Quick Evaluation

## 2017-02-17 ENCOUNTER — Inpatient Hospital Stay (HOSPITAL_COMMUNITY): Payer: Medicare Other

## 2017-02-17 ENCOUNTER — Encounter (HOSPITAL_COMMUNITY)
Admission: RE | Disposition: A | Discharge status: Skilled Nursing Facility | Payer: Self-pay | Source: Ambulatory Visit | Attending: Neurosurgery

## 2017-02-17 ENCOUNTER — Inpatient Hospital Stay (HOSPITAL_COMMUNITY)
Admission: RE | Admit: 2017-02-17 | Discharge: 2017-02-22 | DRG: 453 | Disposition: A | Discharge status: Skilled Nursing Facility | Payer: Medicare Other | Source: Ambulatory Visit | Attending: Neurosurgery | Admitting: Neurosurgery

## 2017-02-17 ENCOUNTER — Encounter (HOSPITAL_COMMUNITY): Payer: Self-pay | Admitting: *Deleted

## 2017-02-17 ENCOUNTER — Inpatient Hospital Stay (HOSPITAL_COMMUNITY): Payer: Medicare Other | Admitting: Emergency Medicine

## 2017-02-17 DIAGNOSIS — G4733 Obstructive sleep apnea (adult) (pediatric): Secondary | ICD-10-CM | POA: Diagnosis present

## 2017-02-17 DIAGNOSIS — M48062 Spinal stenosis, lumbar region with neurogenic claudication: Secondary | ICD-10-CM | POA: Diagnosis present

## 2017-02-17 DIAGNOSIS — F17290 Nicotine dependence, other tobacco product, uncomplicated: Secondary | ICD-10-CM | POA: Diagnosis present

## 2017-02-17 DIAGNOSIS — Z9081 Acquired absence of spleen: Secondary | ICD-10-CM

## 2017-02-17 DIAGNOSIS — G960 Cerebrospinal fluid leak: Secondary | ICD-10-CM | POA: Diagnosis not present

## 2017-02-17 DIAGNOSIS — R0902 Hypoxemia: Secondary | ICD-10-CM | POA: Diagnosis present

## 2017-02-17 DIAGNOSIS — Z7952 Long term (current) use of systemic steroids: Secondary | ICD-10-CM

## 2017-02-17 DIAGNOSIS — G4730 Sleep apnea, unspecified: Secondary | ICD-10-CM | POA: Diagnosis present

## 2017-02-17 DIAGNOSIS — M4316 Spondylolisthesis, lumbar region: Secondary | ICD-10-CM | POA: Diagnosis present

## 2017-02-17 DIAGNOSIS — Z882 Allergy status to sulfonamides status: Secondary | ICD-10-CM | POA: Diagnosis not present

## 2017-02-17 DIAGNOSIS — Z79899 Other long term (current) drug therapy: Secondary | ICD-10-CM | POA: Diagnosis not present

## 2017-02-17 DIAGNOSIS — M069 Rheumatoid arthritis, unspecified: Secondary | ICD-10-CM | POA: Diagnosis present

## 2017-02-17 DIAGNOSIS — Z419 Encounter for procedure for purposes other than remedying health state, unspecified: Secondary | ICD-10-CM

## 2017-02-17 DIAGNOSIS — M5116 Intervertebral disc disorders with radiculopathy, lumbar region: Secondary | ICD-10-CM | POA: Diagnosis present

## 2017-02-17 DIAGNOSIS — J9811 Atelectasis: Secondary | ICD-10-CM | POA: Diagnosis not present

## 2017-02-17 DIAGNOSIS — Z96642 Presence of left artificial hip joint: Secondary | ICD-10-CM | POA: Diagnosis present

## 2017-02-17 DIAGNOSIS — Z888 Allergy status to other drugs, medicaments and biological substances status: Secondary | ICD-10-CM

## 2017-02-17 DIAGNOSIS — J449 Chronic obstructive pulmonary disease, unspecified: Secondary | ICD-10-CM | POA: Diagnosis present

## 2017-02-17 DIAGNOSIS — Z8546 Personal history of malignant neoplasm of prostate: Secondary | ICD-10-CM

## 2017-02-17 DIAGNOSIS — J9621 Acute and chronic respiratory failure with hypoxia: Secondary | ICD-10-CM | POA: Diagnosis not present

## 2017-02-17 DIAGNOSIS — Z6837 Body mass index (BMI) 37.0-37.9, adult: Secondary | ICD-10-CM

## 2017-02-17 DIAGNOSIS — M51369 Other intervertebral disc degeneration, lumbar region without mention of lumbar back pain or lower extremity pain: Secondary | ICD-10-CM | POA: Diagnosis present

## 2017-02-17 DIAGNOSIS — I10 Essential (primary) hypertension: Secondary | ICD-10-CM | POA: Diagnosis present

## 2017-02-17 DIAGNOSIS — M5136 Other intervertebral disc degeneration, lumbar region: Secondary | ICD-10-CM | POA: Diagnosis present

## 2017-02-17 DIAGNOSIS — M4690 Unspecified inflammatory spondylopathy, site unspecified: Secondary | ICD-10-CM | POA: Diagnosis present

## 2017-02-17 DIAGNOSIS — K219 Gastro-esophageal reflux disease without esophagitis: Secondary | ICD-10-CM | POA: Diagnosis present

## 2017-02-17 DIAGNOSIS — F329 Major depressive disorder, single episode, unspecified: Secondary | ICD-10-CM | POA: Diagnosis present

## 2017-02-17 DIAGNOSIS — G9741 Accidental puncture or laceration of dura during a procedure: Secondary | ICD-10-CM | POA: Diagnosis not present

## 2017-02-17 DIAGNOSIS — M419 Scoliosis, unspecified: Secondary | ICD-10-CM | POA: Diagnosis present

## 2017-02-17 HISTORY — PX: MAXIMUM ACCESS (MAS)POSTERIOR LUMBAR INTERBODY FUSION (PLIF) 3 LEVEL: SHX6370

## 2017-02-17 SURGERY — POSTERIOR LUMBAR FUSION 3 LEVEL
Anesthesia: General

## 2017-02-17 MED ORDER — PHENYLEPHRINE HCL 10 MG/ML IJ SOLN
INTRAVENOUS | Status: DC | PRN
  Administered 2017-02-17: 20 ug/min via INTRAVENOUS

## 2017-02-17 MED ORDER — SURGIFOAM 100 EX MISC
CUTANEOUS | Status: DC | PRN
  Administered 2017-02-17: 07:00:00 via TOPICAL

## 2017-02-17 MED ORDER — VANCOMYCIN HCL 1000 MG IV SOLR
INTRAVENOUS | Status: DC | PRN
  Administered 2017-02-17: 1000 mg

## 2017-02-17 MED ORDER — ONDANSETRON HCL 4 MG/2ML IJ SOLN
INTRAMUSCULAR | Status: AC
  Filled 2017-02-17: qty 2

## 2017-02-17 MED ORDER — MORPHINE SULFATE (PF) 4 MG/ML IV SOLN
4.0000 mg | INTRAVENOUS | Status: DC | PRN
  Administered 2017-02-17 – 2017-02-20 (×5): 4 mg via INTRAVENOUS
  Filled 2017-02-17 (×6): qty 1

## 2017-02-17 MED ORDER — ONDANSETRON HCL 4 MG/2ML IJ SOLN: 4.0000 mg | Freq: Four times a day (QID) | INTRAMUSCULAR | Status: DC | PRN

## 2017-02-17 MED ORDER — PROPOFOL 10 MG/ML IV BOLUS
INTRAVENOUS | Status: DC | PRN
  Administered 2017-02-17: 180 mg via INTRAVENOUS

## 2017-02-17 MED ORDER — SIMVASTATIN 40 MG PO TABS
40.0000 mg | ORAL_TABLET | Freq: Every day | ORAL | Status: DC
  Administered 2017-02-18 – 2017-02-21 (×4): 40 mg via ORAL
  Filled 2017-02-17 (×4): qty 1

## 2017-02-17 MED ORDER — CEFAZOLIN SODIUM-DEXTROSE 2-4 GM/100ML-% IV SOLN
2.0000 g | INTRAVENOUS | Status: AC
  Administered 2017-02-17 (×2): 2 g via INTRAVENOUS
  Filled 2017-02-17: qty 100

## 2017-02-17 MED ORDER — PHENYLEPHRINE 40 MCG/ML (10ML) SYRINGE FOR IV PUSH (FOR BLOOD PRESSURE SUPPORT)
PREFILLED_SYRINGE | INTRAVENOUS | Status: AC
  Filled 2017-02-17: qty 10

## 2017-02-17 MED ORDER — FLUTICASONE FUROATE-VILANTEROL 100-25 MCG/INH IN AEPB
1.0000 | INHALATION_SPRAY | Freq: Every day | RESPIRATORY_TRACT | Status: DC
  Administered 2017-02-18 – 2017-02-22 (×5): 1 via RESPIRATORY_TRACT
  Filled 2017-02-17: qty 28

## 2017-02-17 MED ORDER — HYDROMORPHONE HCL 1 MG/ML IJ SOLN
INTRAMUSCULAR | Status: AC
  Administered 2017-02-17: 0.5 mg via INTRAVENOUS
  Filled 2017-02-17: qty 0.5

## 2017-02-17 MED ORDER — BISACODYL 10 MG RE SUPP
10.0000 mg | Freq: Every day | RECTAL | Status: DC | PRN
  Administered 2017-02-21: 10 mg via RECTAL
  Filled 2017-02-17: qty 1

## 2017-02-17 MED ORDER — FENTANYL CITRATE (PF) 250 MCG/5ML IJ SOLN
INTRAMUSCULAR | Status: AC
Start: 2017-02-17 — End: 2017-02-17
  Filled 2017-02-17: qty 5

## 2017-02-17 MED ORDER — SODIUM CHLORIDE 0.9% FLUSH: 3.0000 mL | INTRAVENOUS | Status: DC | PRN

## 2017-02-17 MED ORDER — VANCOMYCIN HCL 1000 MG IV SOLR
INTRAVENOUS | Status: AC
  Filled 2017-02-17: qty 1000

## 2017-02-17 MED ORDER — LIDOCAINE-EPINEPHRINE 1 %-1:100000 IJ SOLN
INTRAMUSCULAR | Status: DC | PRN
  Administered 2017-02-17: 10 mL

## 2017-02-17 MED ORDER — DULOXETINE HCL 30 MG PO CPEP
30.0000 mg | ORAL_CAPSULE | Freq: Every day | ORAL | Status: DC
  Administered 2017-02-17 – 2017-02-21 (×5): 30 mg via ORAL
  Filled 2017-02-17 (×5): qty 1

## 2017-02-17 MED ORDER — ROCURONIUM BROMIDE 10 MG/ML (PF) SYRINGE
PREFILLED_SYRINGE | INTRAVENOUS | Status: AC
  Filled 2017-02-17: qty 5

## 2017-02-17 MED ORDER — THROMBIN 5000 UNITS EX SOLR: CUTANEOUS | Status: DC | PRN

## 2017-02-17 MED ORDER — HYDROMORPHONE HCL 1 MG/ML IJ SOLN
0.2500 mg | INTRAMUSCULAR | Status: DC | PRN
  Administered 2017-02-17 (×3): 0.5 mg via INTRAVENOUS

## 2017-02-17 MED ORDER — OXYCODONE HCL 5 MG PO TABS
ORAL_TABLET | ORAL | Status: AC
  Administered 2017-02-17: 10 mg via ORAL
  Filled 2017-02-17: qty 2

## 2017-02-17 MED ORDER — HYDROCORTISONE NA SUCCINATE PF 100 MG IJ SOLR
100.0000 mg | Freq: Four times a day (QID) | INTRAMUSCULAR | Status: AC
Start: 2017-02-17 — End: 2017-02-19
  Administered 2017-02-17 – 2017-02-19 (×5): 100 mg via INTRAVENOUS
  Filled 2017-02-17 (×5): qty 2

## 2017-02-17 MED ORDER — HYDROMORPHONE HCL 1 MG/ML IJ SOLN
INTRAMUSCULAR | Status: AC
  Filled 2017-02-17: qty 0.5

## 2017-02-17 MED ORDER — GELATIN ABSORBABLE MT POWD
OROMUCOSAL | Status: DC | PRN
  Administered 2017-02-17: 10:00:00 via TOPICAL

## 2017-02-17 MED ORDER — LIDOCAINE-EPINEPHRINE 1 %-1:100000 IJ SOLN
INTRAMUSCULAR | Status: AC
  Filled 2017-02-17: qty 1

## 2017-02-17 MED ORDER — FLUTICASONE PROPIONATE 50 MCG/ACT NA SUSP
2.0000 | Freq: Every day | NASAL | Status: DC
  Administered 2017-02-18 – 2017-02-22 (×5): 2 via NASAL
  Filled 2017-02-17: qty 16

## 2017-02-17 MED ORDER — ACETAMINOPHEN 650 MG RE SUPP: 650.0000 mg | RECTAL | Status: DC | PRN

## 2017-02-17 MED ORDER — ALBUMIN HUMAN 5 % IV SOLN
INTRAVENOUS | Status: DC | PRN
  Administered 2017-02-17: 09:00:00 via INTRAVENOUS

## 2017-02-17 MED ORDER — BUPIVACAINE HCL (PF) 0.5 % IJ SOLN
INTRAMUSCULAR | Status: AC
  Filled 2017-02-17: qty 30

## 2017-02-17 MED ORDER — MIDAZOLAM HCL 2 MG/2ML IJ SOLN
INTRAMUSCULAR | Status: AC
  Filled 2017-02-17: qty 2

## 2017-02-17 MED ORDER — SODIUM CHLORIDE 0.9 % IV SOLN
250.0000 mL | INTRAVENOUS | Status: DC
  Administered 2017-02-17: 250 mL via INTRAVENOUS

## 2017-02-17 MED ORDER — MENTHOL 3 MG MT LOZG: 1.0000 | LOZENGE | OROMUCOSAL | Status: DC | PRN

## 2017-02-17 MED ORDER — FENTANYL CITRATE (PF) 250 MCG/5ML IJ SOLN
INTRAMUSCULAR | Status: AC
  Filled 2017-02-17: qty 5

## 2017-02-17 MED ORDER — PROPOFOL 10 MG/ML IV BOLUS
INTRAVENOUS | Status: AC
  Filled 2017-02-17: qty 20

## 2017-02-17 MED ORDER — SODIUM CHLORIDE 0.9% FLUSH
3.0000 mL | Freq: Two times a day (BID) | INTRAVENOUS | Status: DC
  Administered 2017-02-18 – 2017-02-22 (×9): 3 mL via INTRAVENOUS

## 2017-02-17 MED ORDER — LIDOCAINE 2% (20 MG/ML) 5 ML SYRINGE
INTRAMUSCULAR | Status: AC
  Filled 2017-02-17: qty 5

## 2017-02-17 MED ORDER — LACTATED RINGERS IV SOLN
INTRAVENOUS | Status: DC | PRN
  Administered 2017-02-17 (×3): via INTRAVENOUS

## 2017-02-17 MED ORDER — SUGAMMADEX SODIUM 200 MG/2ML IV SOLN
INTRAVENOUS | Status: DC | PRN
  Administered 2017-02-17: 200 mg via INTRAVENOUS

## 2017-02-17 MED ORDER — FENTANYL CITRATE (PF) 100 MCG/2ML IJ SOLN
INTRAMUSCULAR | Status: DC | PRN
  Administered 2017-02-17 (×2): 25 ug via INTRAVENOUS
  Administered 2017-02-17 (×3): 50 ug via INTRAVENOUS
  Administered 2017-02-17: 25 ug via INTRAVENOUS
  Administered 2017-02-17: 150 ug via INTRAVENOUS
  Administered 2017-02-17 (×5): 50 ug via INTRAVENOUS

## 2017-02-17 MED ORDER — THROMBIN 5000 UNITS EX SOLR
CUTANEOUS | Status: AC
  Filled 2017-02-17: qty 5000

## 2017-02-17 MED ORDER — ONDANSETRON HCL 4 MG PO TABS: 4.0000 mg | ORAL_TABLET | Freq: Four times a day (QID) | ORAL | Status: DC | PRN

## 2017-02-17 MED ORDER — GABAPENTIN 300 MG PO CAPS
600.0000 mg | ORAL_CAPSULE | Freq: Three times a day (TID) | ORAL | Status: DC
  Administered 2017-02-17 – 2017-02-22 (×14): 600 mg via ORAL
  Filled 2017-02-17 (×14): qty 2

## 2017-02-17 MED ORDER — ARTIFICIAL TEARS OPHTHALMIC OINT
TOPICAL_OINTMENT | OPHTHALMIC | Status: DC | PRN
  Administered 2017-02-17: 1 via OPHTHALMIC

## 2017-02-17 MED ORDER — SODIUM CHLORIDE 0.9 % IR SOLN
Status: DC | PRN
  Administered 2017-02-17 (×2)

## 2017-02-17 MED ORDER — CHLORHEXIDINE GLUCONATE CLOTH 2 % EX PADS: 6.0000 | MEDICATED_PAD | Freq: Once | CUTANEOUS | Status: DC

## 2017-02-17 MED ORDER — ALBUTEROL SULFATE (2.5 MG/3ML) 0.083% IN NEBU: 2.5000 mg | INHALATION_SOLUTION | Freq: Four times a day (QID) | RESPIRATORY_TRACT | Status: DC | PRN

## 2017-02-17 MED ORDER — TAMSULOSIN HCL 0.4 MG PO CAPS
0.4000 mg | ORAL_CAPSULE | Freq: Every day | ORAL | Status: DC
  Administered 2017-02-18 – 2017-02-22 (×5): 0.4 mg via ORAL
  Filled 2017-02-17 (×5): qty 1

## 2017-02-17 MED ORDER — CYCLOBENZAPRINE HCL 10 MG PO TABS
10.0000 mg | ORAL_TABLET | Freq: Three times a day (TID) | ORAL | Status: DC | PRN
  Administered 2017-02-17 – 2017-02-21 (×7): 10 mg via ORAL
  Filled 2017-02-17 (×6): qty 1

## 2017-02-17 MED ORDER — SUGAMMADEX SODIUM 200 MG/2ML IV SOLN
INTRAVENOUS | Status: AC
  Filled 2017-02-17: qty 2

## 2017-02-17 MED ORDER — FINASTERIDE 5 MG PO TABS
5.0000 mg | ORAL_TABLET | Freq: Every day | ORAL | Status: DC
  Administered 2017-02-18 – 2017-02-22 (×5): 5 mg via ORAL
  Filled 2017-02-17 (×5): qty 1

## 2017-02-17 MED ORDER — 0.9 % SODIUM CHLORIDE (POUR BTL) OPTIME
TOPICAL | Status: DC | PRN
  Administered 2017-02-17: 1000 mL

## 2017-02-17 MED ORDER — TRAZODONE HCL 50 MG PO TABS
50.0000 mg | ORAL_TABLET | Freq: Every day | ORAL | Status: DC
  Administered 2017-02-17 – 2017-02-21 (×5): 50 mg via ORAL
  Filled 2017-02-17 (×5): qty 1

## 2017-02-17 MED ORDER — HEMOSTATIC AGENTS (NO CHARGE) OPTIME
TOPICAL | Status: DC | PRN
Start: 2017-02-17 — End: 2017-02-17
  Administered 2017-02-17: 1 via TOPICAL

## 2017-02-17 MED ORDER — TIOTROPIUM BROMIDE MONOHYDRATE 18 MCG IN CAPS
18.0000 ug | ORAL_CAPSULE | Freq: Every day | RESPIRATORY_TRACT | Status: DC
  Administered 2017-02-20 – 2017-02-22 (×3): 18 ug via RESPIRATORY_TRACT
  Filled 2017-02-17 (×2): qty 5

## 2017-02-17 MED ORDER — METOPROLOL SUCCINATE ER 50 MG PO TB24
50.0000 mg | ORAL_TABLET | Freq: Every evening | ORAL | Status: DC
  Administered 2017-02-18 – 2017-02-21 (×4): 50 mg via ORAL
  Filled 2017-02-17 (×4): qty 1

## 2017-02-17 MED ORDER — DULOXETINE HCL 60 MG PO CPEP: 60.0000 mg | ORAL_CAPSULE | Freq: Every day | ORAL | Status: DC

## 2017-02-17 MED ORDER — ONDANSETRON HCL 4 MG/2ML IJ SOLN
INTRAMUSCULAR | Status: DC | PRN
  Administered 2017-02-17: 4 mg via INTRAVENOUS

## 2017-02-17 MED ORDER — ROCURONIUM BROMIDE 10 MG/ML (PF) SYRINGE
PREFILLED_SYRINGE | INTRAVENOUS | Status: DC | PRN
  Administered 2017-02-17: 60 mg via INTRAVENOUS

## 2017-02-17 MED ORDER — DULOXETINE HCL 60 MG PO CPEP
60.0000 mg | ORAL_CAPSULE | Freq: Every day | ORAL | Status: DC
  Administered 2017-02-18 – 2017-02-22 (×5): 60 mg via ORAL
  Filled 2017-02-17 (×5): qty 1

## 2017-02-17 MED ORDER — SALINE SPRAY 0.65 % NA SOLN: 1.0000 | NASAL | Status: DC | PRN

## 2017-02-17 MED ORDER — PREDNISOLONE 5 MG PO TABS
5.0000 mg | ORAL_TABLET | Freq: Every day | ORAL | Status: DC
  Administered 2017-02-18 – 2017-02-22 (×5): 5 mg via ORAL
  Filled 2017-02-17 (×7): qty 1

## 2017-02-17 MED ORDER — CYCLOBENZAPRINE HCL 10 MG PO TABS
ORAL_TABLET | ORAL | Status: AC
  Administered 2017-02-17: 10 mg via ORAL
  Filled 2017-02-17: qty 1

## 2017-02-17 MED ORDER — DOCUSATE SODIUM 100 MG PO CAPS
100.0000 mg | ORAL_CAPSULE | Freq: Two times a day (BID) | ORAL | Status: DC
  Administered 2017-02-17 – 2017-02-22 (×10): 100 mg via ORAL
  Filled 2017-02-17 (×10): qty 1

## 2017-02-17 MED ORDER — DULOXETINE HCL 30 MG PO CPEP: 30.0000 mg | ORAL_CAPSULE | Freq: Every evening | ORAL | Status: DC

## 2017-02-17 MED ORDER — FOLIC ACID 1 MG PO TABS
1.0000 mg | ORAL_TABLET | Freq: Every day | ORAL | Status: DC
  Administered 2017-02-18 – 2017-02-22 (×5): 1 mg via ORAL
  Filled 2017-02-17 (×5): qty 1

## 2017-02-17 MED ORDER — CEFAZOLIN SODIUM-DEXTROSE 2-4 GM/100ML-% IV SOLN
2.0000 g | Freq: Three times a day (TID) | INTRAVENOUS | Status: AC
  Administered 2017-02-17 – 2017-02-18 (×2): 2 g via INTRAVENOUS
  Filled 2017-02-17 (×2): qty 100

## 2017-02-17 MED ORDER — BUPIVACAINE LIPOSOME 1.3 % IJ SUSP
20.0000 mL | INTRAMUSCULAR | Status: DC
  Filled 2017-02-17: qty 20

## 2017-02-17 MED ORDER — PHENYLEPHRINE 40 MCG/ML (10ML) SYRINGE FOR IV PUSH (FOR BLOOD PRESSURE SUPPORT)
PREFILLED_SYRINGE | INTRAVENOUS | Status: DC | PRN
  Administered 2017-02-17: 80 ug via INTRAVENOUS
  Administered 2017-02-17: 40 ug via INTRAVENOUS
  Administered 2017-02-17 (×2): 80 ug via INTRAVENOUS
  Administered 2017-02-17: 40 ug via INTRAVENOUS
  Administered 2017-02-17: 80 ug via INTRAVENOUS

## 2017-02-17 MED ORDER — PHENOL 1.4 % MT LIQD: 1.0000 | OROMUCOSAL | Status: DC | PRN

## 2017-02-17 MED ORDER — PANTOPRAZOLE SODIUM 40 MG PO TBEC
80.0000 mg | DELAYED_RELEASE_TABLET | Freq: Every day | ORAL | Status: DC
  Administered 2017-02-18: 80 mg via ORAL
  Filled 2017-02-17: qty 2

## 2017-02-17 MED ORDER — CEFAZOLIN SODIUM 1 G IJ SOLR
INTRAMUSCULAR | Status: AC
  Filled 2017-02-17: qty 20

## 2017-02-17 MED ORDER — THROMBIN 20000 UNITS EX SOLR
CUTANEOUS | Status: AC
  Filled 2017-02-17: qty 20000

## 2017-02-17 MED ORDER — LISINOPRIL 20 MG PO TABS
20.0000 mg | ORAL_TABLET | Freq: Every day | ORAL | Status: DC
  Administered 2017-02-18 – 2017-02-22 (×5): 20 mg via ORAL
  Filled 2017-02-17 (×5): qty 1

## 2017-02-17 MED ORDER — OXYCODONE HCL 5 MG PO TABS
5.0000 mg | ORAL_TABLET | ORAL | Status: DC | PRN
  Administered 2017-02-17 – 2017-02-18 (×3): 10 mg via ORAL
  Administered 2017-02-19: 5 mg via ORAL
  Administered 2017-02-19 – 2017-02-22 (×11): 10 mg via ORAL
  Filled 2017-02-17 (×3): qty 2
  Filled 2017-02-17: qty 1
  Filled 2017-02-17 (×10): qty 2

## 2017-02-17 MED ORDER — LIDOCAINE 2% (20 MG/ML) 5 ML SYRINGE
INTRAMUSCULAR | Status: DC | PRN
  Administered 2017-02-17: 100 mg via INTRAVENOUS

## 2017-02-17 MED ORDER — TRAMADOL HCL 50 MG PO TABS
100.0000 mg | ORAL_TABLET | Freq: Every day | ORAL | Status: DC | PRN
  Administered 2017-02-18 – 2017-02-21 (×3): 100 mg via ORAL
  Filled 2017-02-17 (×3): qty 2

## 2017-02-17 MED ORDER — ACETAMINOPHEN 325 MG PO TABS
650.0000 mg | ORAL_TABLET | ORAL | Status: DC | PRN
  Administered 2017-02-18 – 2017-02-22 (×7): 650 mg via ORAL
  Filled 2017-02-17 (×7): qty 2

## 2017-02-17 MED ORDER — BACITRACIN ZINC 500 UNIT/GM EX OINT
TOPICAL_OINTMENT | CUTANEOUS | Status: DC | PRN
  Administered 2017-02-17: 1 via TOPICAL

## 2017-02-17 MED ORDER — MIDAZOLAM HCL 5 MG/5ML IJ SOLN
INTRAMUSCULAR | Status: DC | PRN
  Administered 2017-02-17: 1 mg via INTRAVENOUS

## 2017-02-17 MED ORDER — LACTATED RINGERS IV SOLN
INTRAVENOUS | Status: DC | PRN
  Administered 2017-02-17: 08:00:00 via INTRAVENOUS

## 2017-02-17 MED ORDER — BACITRACIN ZINC 500 UNIT/GM EX OINT
TOPICAL_OINTMENT | CUTANEOUS | Status: AC
  Filled 2017-02-17: qty 28.35

## 2017-02-17 MED ORDER — PREDNISONE 10 MG PO TABS
5.0000 mg | ORAL_TABLET | Freq: Every day | ORAL | Status: DC
  Administered 2017-02-18 – 2017-02-22 (×5): 5 mg via ORAL
  Filled 2017-02-17 (×5): qty 1

## 2017-02-17 MED ORDER — BUPIVACAINE LIPOSOME 1.3 % IJ SUSP
INTRAMUSCULAR | Status: DC | PRN
  Administered 2017-02-17: 20 mL

## 2017-02-17 MED FILL — Sodium Chloride IV Soln 0.9%: INTRAVENOUS | Qty: 1000 | Status: AC

## 2017-02-17 MED FILL — Heparin Sodium (Porcine) Inj 1000 Unit/ML: INTRAMUSCULAR | Qty: 30 | Status: AC

## 2017-02-17 SURGICAL SUPPLY — 73 items
APL SKNCLS STERI-STRIP NONHPOA (GAUZE/BANDAGES/DRESSINGS) ×1
APL SRG 60D 8 XTD TIP BNDBL (TIP) ×1
BAG DECANTER FOR FLEXI CONT (MISCELLANEOUS) ×2 IMPLANT
BENZOIN TINCTURE PRP APPL 2/3 (GAUZE/BANDAGES/DRESSINGS) ×2 IMPLANT
BLADE CLIPPER SURG (BLADE) IMPLANT
BUR MATCHSTICK NEURO 3.0 LAGG (BURR) ×2 IMPLANT
BUR PRECISION FLUTE 6.0 (BURR) ×2 IMPLANT
CANISTER SUCT 3000ML PPV (MISCELLANEOUS) ×2 IMPLANT
CAP REVERE LOCKING (Cap) ×8 IMPLANT
CARTRIDGE OIL MAESTRO DRILL (MISCELLANEOUS) ×1 IMPLANT
CONN CROSSLINK REV 6.35 48-60 (Connector) ×2 IMPLANT
CONNECTOR CRSLNK REV6.35 48-60 (Connector) IMPLANT
CONT SPEC 4OZ CLIKSEAL STRL BL (MISCELLANEOUS) ×2 IMPLANT
CORDS BIPOLAR (ELECTRODE) ×1 IMPLANT
COVER BACK TABLE 60X90IN (DRAPES) ×2 IMPLANT
DEVICE RISE INTERBODY CREO (Neuro Prosthesis/Implant) IMPLANT
DIFFUSER DRILL AIR PNEUMATIC (MISCELLANEOUS) ×2 IMPLANT
DRAPE C-ARM 42X72 X-RAY (DRAPES) ×4 IMPLANT
DRAPE HALF SHEET 40X57 (DRAPES) ×2 IMPLANT
DRAPE LAPAROTOMY 100X72X124 (DRAPES) ×2 IMPLANT
DRAPE POUCH INSTRU U-SHP 10X18 (DRAPES) ×2 IMPLANT
DRAPE SURG 17X23 STRL (DRAPES) ×8 IMPLANT
DURASEAL APPLICATOR TIP (TIP) ×1 IMPLANT
DURASEAL SPINE SEALANT 3ML (MISCELLANEOUS) ×1 IMPLANT
ELECT BLADE 4.0 EZ CLEAN MEGAD (MISCELLANEOUS) ×2
ELECT REM PT RETURN 9FT ADLT (ELECTROSURGICAL) ×2
ELECTRODE BLDE 4.0 EZ CLN MEGD (MISCELLANEOUS) ×1 IMPLANT
ELECTRODE REM PT RTRN 9FT ADLT (ELECTROSURGICAL) ×1 IMPLANT
GAUZE SPONGE 4X4 12PLY STRL (GAUZE/BANDAGES/DRESSINGS) ×2 IMPLANT
GAUZE SPONGE 4X4 16PLY XRAY LF (GAUZE/BANDAGES/DRESSINGS) ×2 IMPLANT
GLOVE BIO SURGEON STRL SZ8 (GLOVE) ×5 IMPLANT
GLOVE BIO SURGEON STRL SZ8.5 (GLOVE) ×4 IMPLANT
GLOVE EXAM NITRILE LRG STRL (GLOVE) IMPLANT
GLOVE EXAM NITRILE XL STR (GLOVE) IMPLANT
GLOVE EXAM NITRILE XS STR PU (GLOVE) IMPLANT
GLOVE INDICATOR 7.5 STRL GRN (GLOVE) ×2 IMPLANT
GLOVE INDICATOR 8.5 STRL (GLOVE) ×1 IMPLANT
GOWN STRL REUS W/ TWL LRG LVL3 (GOWN DISPOSABLE) IMPLANT
GOWN STRL REUS W/ TWL XL LVL3 (GOWN DISPOSABLE) ×2 IMPLANT
GOWN STRL REUS W/TWL 2XL LVL3 (GOWN DISPOSABLE) IMPLANT
GOWN STRL REUS W/TWL LRG LVL3 (GOWN DISPOSABLE)
GOWN STRL REUS W/TWL XL LVL3 (GOWN DISPOSABLE) ×4
HEMOSTAT POWDER KIT SURGIFOAM (HEMOSTASIS) ×1 IMPLANT
KIT BASIN OR (CUSTOM PROCEDURE TRAY) ×2 IMPLANT
KIT ROOM TURNOVER OR (KITS) ×2 IMPLANT
MARKER SKIN DUAL TIP RULER LAB (MISCELLANEOUS) ×1 IMPLANT
NDL HYPO 21X1.5 SAFETY (NEEDLE) IMPLANT
NEEDLE HYPO 21X1.5 SAFETY (NEEDLE) IMPLANT
NEEDLE HYPO 22GX1.5 SAFETY (NEEDLE) ×2 IMPLANT
NS IRRIG 1000ML POUR BTL (IV SOLUTION) ×2 IMPLANT
OIL CARTRIDGE MAESTRO DRILL (MISCELLANEOUS) ×2
PACK LAMINECTOMY NEURO (CUSTOM PROCEDURE TRAY) ×2 IMPLANT
PAD ARMBOARD 7.5X6 YLW CONV (MISCELLANEOUS) ×6 IMPLANT
PATTIES SURGICAL .5 X1 (DISPOSABLE) IMPLANT
PATTIES SURGICAL 1X1 (DISPOSABLE) ×1 IMPLANT
RISE INTERBODY CREO (Neuro Prosthesis/Implant) IMPLANT
ROD REVERE 6.35 CURVED 150MM (Rod) ×2 IMPLANT
SCREW REVERE 6.5X50MM (Screw) ×8 IMPLANT
SPACER RISE 7-13MM 10X22 (Spacer) ×5 IMPLANT
SPONGE LAP 4X18 X RAY DECT (DISPOSABLE) IMPLANT
SPONGE NEURO XRAY DETECT 1X3 (DISPOSABLE) ×1 IMPLANT
SPONGE SURGIFOAM ABS GEL 100 (HEMOSTASIS) ×2 IMPLANT
STRIP BIOACTIVE 20CC 25X100X8 (Miscellaneous) ×1 IMPLANT
STRIP CLOSURE SKIN 1/2X4 (GAUZE/BANDAGES/DRESSINGS) ×2 IMPLANT
SUT PROLENE 6 0 BV (SUTURE) ×1 IMPLANT
SUT VIC AB 1 CT1 18XBRD ANBCTR (SUTURE) ×2 IMPLANT
SUT VIC AB 1 CT1 8-18 (SUTURE) ×4
SUT VIC AB 2-0 CP2 18 (SUTURE) ×4 IMPLANT
TAPE CLOTH SURG 6X10 WHT LF (GAUZE/BANDAGES/DRESSINGS) ×1 IMPLANT
TOWEL GREEN STERILE (TOWEL DISPOSABLE) ×2 IMPLANT
TOWEL GREEN STERILE FF (TOWEL DISPOSABLE) ×2 IMPLANT
TRAY FOLEY W/METER SILVER 16FR (SET/KITS/TRAYS/PACK) ×2 IMPLANT
WATER STERILE IRR 1000ML POUR (IV SOLUTION) ×2 IMPLANT

## 2017-02-17 NOTE — Progress Notes (Signed)
02/17/2017- Respiratory care note- Pt placed on CPAP of 8 for the night.  Pt brought nasal pillows to use from home.  Pt tolerating well. 2 LPM attached to CPAP as pt was on 2lpm cannula at time of set-up.

## 2017-02-17 NOTE — Anesthesia Procedure Notes (Signed)
Procedure Name: Intubation Date/Time: 02/17/2017 7:44 AM Performed by: Garrison Columbus T Pre-anesthesia Checklist: Patient identified, Emergency Drugs available, Suction available and Patient being monitored Patient Re-evaluated:Patient Re-evaluated prior to inductionOxygen Delivery Method: Circle System Utilized Preoxygenation: Pre-oxygenation with 100% oxygen Intubation Type: IV induction Ventilation: Mask ventilation without difficulty and Oral airway inserted - appropriate to patient size Laryngoscope Size: Sabra Heck and 2 Grade View: Grade II Tube type: Oral Tube size: 7.5 mm Number of attempts: 1 Airway Equipment and Method: Stylet and Oral airway Placement Confirmation: ETT inserted through vocal cords under direct vision,  positive ETCO2 and breath sounds checked- equal and bilateral Secured at: 23 cm Tube secured with: Tape Dental Injury: Teeth and Oropharynx as per pre-operative assessment

## 2017-02-17 NOTE — Transfer of Care (Signed)
Immediate Anesthesia Transfer of Care Note  Patient: [name]  Procedure(s) Performed: Procedure(s): POSTERIOR LUMBAR INTERBODY FUSION, INTERBODY PROSTHESIS, POSTERIOR LATERAL ARTHRODESIS, POSTERIOR SEGMENTAL INSTRUMENTATION LUMBAR TWO- LUMBAR THREE, LUMBAR THREE- LUMBAR FOUR, LUMBAR FOUR- LUMBAR FIVE (N/A)  Patient Location: PACU  Anesthesia Type:General  Level of Consciousness: awake and alert   Airway & Oxygen Therapy: Patient Spontanous Breathing and Patient connected to face mask oxygen  Post-op Assessment: Report given to RN, Post -op Vital signs reviewed and stable and Patient moving all extremities X 4  Post vital signs: Reviewed and stable  Last Vitals:  Vitals:   02/17/17 0556 02/17/17 1445  BP: 137/61 (!) (P) 157/79  Pulse: 72 (!) (P) 103  Resp: 20 (P) 17  Temp: 36.2 C (P) 36.6 C    Last Pain:  Vitals:   02/17/17 0624  TempSrc:   PainSc: 2       Patients Stated Pain Goal: 4 (67/59/16 3846)  Complications: No apparent anesthesia complications

## 2017-02-17 NOTE — Op Note (Signed)
Brief history: The patient is a 76 year old white male who has had prior back surgeries by other physicians. He has had chronic back and leg pain. He has failed medical management. He was worked up with a lumbar MRI and lumbar myelo CT which demonstrated the patient had multilevel degenerative changes and stenosis most prominent at L2-3, L3-4 and L4-5. I discussed the various treatment options with the patient including surgery. He has weighed the risks, benefits, and alternatives to surgery and decided to proceed with a L2-3, L3-4 and L4-5 decompression, instrumentation, and fusion.  Preoperative diagnosis: L2-3, L3-4 and L4-5 Degenerative disc disease, spinal stenosis , lumbar scoliosis; lumbago; lumbar radiculopathy  Postoperative diagnosis: The same  Procedure: Bilateral L2-3, L3-4 and L4-5 Laminotomy/foraminotomies to decompress the bilateral L2, L3, L4 and L5 nerve roots(the work required to do this was in addition to the work required to do the posterior lumbar interbody fusion because of the patient's spinal stenosis, facet arthropathy. Etc. requiring a wide decompression of the nerve roots.); L2-3, L3-4 and L4-5  lumbar interbody fusion with local morselized autograft bone and Kinnex graft extender; insertion of interbody prosthesis at L2-3, L3-4 and L4-5 segmental (globus peek expandable interbody prosthesis); posterior instrumentation from L2 to L5 with globus titanium pedicle screws and rods; posterior lateral arthrodesis at L2-3, L3-4 and L4-5 with local morselized autograft bone and Kinnex bone graft extender.  Surgeon: Dr. Earle Gell  Asst.: Dr. Vertell Limber  Anesthesia: Gen. endotracheal  Estimated blood loss: 400 mL  Drains: None  Complications: None  Description of procedure: The patient was brought to the operating room by the anesthesia team. General endotracheal anesthesia was induced. The patient was turned to the prone position on the Wilson frame. The patient's lumbosacral  region was then prepared with Betadine scrub and Betadine solution. Sterile drapes were applied.  I then injected the area to be incised with Marcaine with epinephrine solution. I then used the scalpel to make a linear midline incision over the L2-3, L3-4 and L4-5 interspace, incising through the old surgical scar. I then used electrocautery to perform a bilateral subperiosteal dissection exposing the spinous process and lamina of L2, L3, L4 and L5. We then obtained intraoperative radiograph to confirm our location. We then inserted the Verstrac retractor to provide exposure.  I began the decompression by using the high speed drill to perform laminotomies at L2-3, L3-4 and L4-5 bilaterally. We then used the Kerrison punches to widen the laminotomy and removed the ligamentum flavum at L2-3,  L3-4 and L4-5 bilaterally. We encountered epidural scar tissue at L2-3 on the right and L5-S1 on the left from the previous operations. We used the Kerrison punches to remove the medial facets at L2-3, L3-4 and L4-5 bilaterally. We performed wide foraminotomies about the bilateral L2, L3, L4 and L5 nerve roots completing the decompression.  We now turned our attention to the posterior lumbar interbody fusion. I used a scalpel to incise the intervertebral disc at L2-3, L3-4 and L4-5 bilaterally. I then performed a partial intervertebral discectomy at L2-3, L3-4 and L4-5 bilaterally using the pituitary forceps. We prepared the vertebral endplates at W4-3, X5-4 and L4-5 bilaterally for the fusion by removing the soft tissues with the curettes. We encountered a durotomy at L4-5 from the ventral thecal sac. I placed a single 6-0 Prolene suture. We then used the trial spacers to pick the appropriate sized interbody prosthesis. We prefilled his prosthesis with a combination of local morselized autograft bone that we obtained during the decompression as  well as Kinnex bone graft extender. We inserted the prefilled prosthesis into  the interspace at L2-3 from the right, L4-5 from the left, and L3-4 bilaterally. There was a good snug fit of the prosthesis in the interspace. We then filled and the remainder of the intervertebral disc space with local morselized autograft bone and Kinnex. This completed the posterior lumbar interbody arthrodesis.  We now turned attention to the instrumentation. Under fluoroscopic guidance we cannulated the bilateral L2, L3, L4 and L5 pedicles with the bone probe. We then removed the bone probe. We then tapped the pedicle with a 5.5 millimeter tap. We then removed the tap. We probed inside the tapped pedicle with a ball probe to rule out cortical breaches. We then inserted a 6.5 x 50 millimeter pedicle screw into the L2, L3, L4 and L5 pedicles bilaterally under fluoroscopic guidance. We then palpated along the medial aspect of the pedicles to rule out cortical breaches. There were none. The nerve roots were not injured. We then connected the unilateral pedicle screws with a lordotic rod. We compressed the construct and secured the rod in place with the caps. We then tightened the caps appropriately. We place a cross connector between the rods. This completed the instrumentation from L3-L5.  We now turned our attention to the posterior lateral arthrodesis at L2-3, L3-4 and L4-5 bilaterally. We used the high-speed drill to decorticate the remainder of the facets, pars, transverse process at L2-3, L3-4 and L4-5 bilaterally. We then applied a combination of local morselized autograft bone and Kinnex bone graft extender over these decorticated posterior lateral structures. This completed the posterior lateral arthrodesis.  We then obtained hemostasis using bipolar electrocautery. We irrigated the wound out with bacitracin solution. We inspected the thecal sac and nerve roots and noted they were well decompressed. We placed DuraSeal over the durotomy site. We then removed the retractor. We placed vancomycin powder  in the wound. We reapproximated patient's thoracolumbar fascia with interrupted #1 Vicryl suture. We reapproximated patient's subcutaneous tissue with interrupted 2-0 Vicryl suture. The reapproximated patient's skin with Steri-Strips and benzoin. The wound was then coated with bacitracin ointment. A sterile dressing was applied. The drapes were removed. The patient was subsequently returned to the supine position where they were extubated by the anesthesia team. He was then transported to the post anesthesia care unit in stable condition. All sponge instrument and needle counts were reportedly correct at the end of this case.

## 2017-02-17 NOTE — Progress Notes (Signed)
Patient ID: ALONTAE CHALOUX, male   DOB: [birth year], 76 y.o.   MRN: [id number] Subjective:  The patient is somnolent but arousable. He is in no apparent distress. He denies headache.  Objective: Vital signs in last 24 hours: Temp:  [97.1 F (36.2 C)-97.9 F (36.6 C)] 97.6 F (36.4 C) (05/24 1650) Pulse Rate:  [72-103] 91 (05/24 1650) Resp:  [13-21] 20 (05/24 1650) BP: (116-157)/(61-88) 150/77 (05/24 1650) SpO2:  [91 %-100 %] 91 % (05/24 1650) Weight:  [108.9 kg (240 lb)] 108.9 kg (240 lb) (05/24 0624)  Intake/Output from previous day: No intake/output data recorded. Intake/Output this shift: No intake/output data recorded.  Physical exam the patient is somnolent but arousable. He is moving his lower extremities well.  Lab Results: No results for input(s): WBC, HGB, HCT, PLT in the last 72 hours. BMET No results for input(s): NA, K, CL, CO2, GLUCOSE, BUN, CREATININE, CALCIUM in the last 72 hours.  Studies/Results: Dg Lumbar Spine 2-3 Views  Result Date: 02/17/2017 CLINICAL DATA:  Intraoperative fluoroscopy.  L2-L5 PLIF. EXAM: LUMBAR SPINE - 2-3 VIEW; DG C-ARM 61-120 MIN COMPARISON:  Lumbar spine myelogram 11/04/2016 FINDINGS: Numbering is based on osteophytes. Status post L2-3, L3-4, L4-5 PLIF. Intervertebral cages and bilateral pedicle screws in place. No hardware displacement or osseous fracture noted. IMPRESSION: Fluoroscopy for L2-L5 PLIF.  No acute finding. Electronically Signed   By: Monte Fantasia M.D.   On: 02/17/2017 14:59   Dg Lumbar Spine 1 View  Result Date: 02/17/2017 CLINICAL DATA:  Lumbar fusion EXAM: LUMBAR SPINE - 1 VIEW COMPARISON:  11/04/2016 FINDINGS: Lateral view of the lumbar spine was obtained and reveals surgical retractors in the posterior soft tissues and a surgical instrument at the L3-4 level. The numbering nomenclature is similar to that used on the prior CT examination. Multilevel disc space narrowing is noted. IMPRESSION: Intraoperative localization at  L3-4 Electronically Signed   By: Inez Catalina M.D.   On: 02/17/2017 10:11   Dg C-arm 1-60 Min  Result Date: 02/17/2017 CLINICAL DATA:  Intraoperative fluoroscopy.  L2-L5 PLIF. EXAM: LUMBAR SPINE - 2-3 VIEW; DG C-ARM 61-120 MIN COMPARISON:  Lumbar spine myelogram 11/04/2016 FINDINGS: Numbering is based on osteophytes. Status post L2-3, L3-4, L4-5 PLIF. Intervertebral cages and bilateral pedicle screws in place. No hardware displacement or osseous fracture noted. IMPRESSION: Fluoroscopy for L2-L5 PLIF.  No acute finding. Electronically Signed   By: Monte Fantasia M.D.   On: 02/17/2017 14:59    Assessment/Plan: The patient is doing well. We will keep his head of bed less than 15 tonight and likely mobilize him tomorrow.  LOS: 0 days     Zeena Starkel D 02/17/2017, 7:22 PM

## 2017-02-17 NOTE — Progress Notes (Signed)
Patient is admitted to 21C10. Admission vital sign is stable and family is at the bedside. Patient is AOX4

## 2017-02-17 NOTE — H&P (Signed)
Subjective: The patient is a 76 year old white male who has complained of back and leg pain consistent with neurogenic claudication. He has failed medical management and was worked up with a lumbar x-rays and a lumbar MRI. These demonstrated L2-3, L3-4 and L4-5 spinal stenosis, spinal stenosis, etc. I discussed the various treatment options with the patient. He has decided to proceed with surgery.   Past Medical History:  Diagnosis Date  . Cancer of prostate ([place])   . COPD (chronic obstructive pulmonary disease) ([place])   . Depression   . Dyspnea    on exertion  . GERD (gastroesophageal reflux disease)   . H/O seasonal allergies   . High cholesterol   . History of kidney stones   . Hypertension   . Rheumatoid arteritis   . Sleep apnea     Past Surgical History:  Procedure Laterality Date  . BACK SURGERY  2010  . HERNIA REPAIR     hernia and inguinal 1986  . HIP ARTHROPLASTY Left 04/2016  . PROSTATE BIOPSY    . SPLENECTOMY  1955  . TOTAL HIP ARTHROPLASTY      Allergies  Allergen Reactions  . Sulfa Antibiotics Shortness Of Breath  . Varenicline Other (See Comments)    Strange thoughts    Social History  Substance Use Topics  . Smoking status: Former Smoker    Packs/day: 1.50    Years: 54.00    Types: Cigarettes    Quit date: 09/27/2014  . Smokeless tobacco: Never Used     Comment: vapes  . Alcohol use 1.8 - 2.4 oz/week    1 - 2 Cans of beer, 2 Shots of liquor per week    Family History  Problem Relation Age of Onset  . Cancer Mother   . Heart disease Father    Prior to Admission medications   Medication Sig Start Date End Date Taking? Authorizing Provider  albuterol (PROVENTIL HFA;VENTOLIN HFA) 108 (90 Base) MCG/ACT inhaler Inhale 2 puffs into the lungs every 6 (six) hours as needed for wheezing or shortness of breath.   Yes [provider]  DULoxetine (CYMBALTA) 30 MG capsule Take 30 mg by mouth every evening. Takes 60 mg in the morning and 30 mg in the  evening   Yes [provider]  DULoxetine (CYMBALTA) 60 MG capsule Take 60 mg by mouth daily. Takes 60 mg in the morning and 30 mg in the evening   Yes [provider]  finasteride (PROSCAR) 5 MG tablet Take 5 mg by mouth daily.   Yes [provider]  fluticasone (FLONASE) 50 MCG/ACT nasal spray Place 2 sprays into both nostrils daily.   Yes [provider]  fluticasone furoate-vilanterol (BREO ELLIPTA) 100-25 MCG/INH AEPB Inhale 1 puff into the lungs daily. 01/18/17  Yes Juanito Doom, MD  folic acid (FOLVITE) 1 MG tablet Take 1 mg by mouth daily.   Yes [provider]  gabapentin (NEURONTIN) 300 MG capsule Take 600 mg by mouth 3 (three) times daily.   Yes [provider]  hydroxychloroquine (PLAQUENIL) 200 MG tablet Take 200 mg by mouth 2 (two) times daily.    Yes [provider]  ibuprofen (ADVIL,MOTRIN) 400 MG tablet Take 400-600 mg by mouth every 6 (six) hours as needed for mild pain (depends on pain if takes 400-600 mg).    Yes [provider]  InFLIXimab (REMICADE IV) Inject into the vein every 8 (eight) weeks.    Yes [provider]  lisinopril (PRINIVIL,ZESTRIL)  20 MG tablet Take 20 mg by mouth daily.   Yes [provider]  methotrexate 2.5 MG tablet Take 25 mg by mouth once a week. Saturday   Yes [provider]  metoprolol succinate (TOPROL-XL) 50 MG 24 hr tablet Take 50 mg by mouth every evening. Take with or immediately following a meal.   Yes [provider]  omeprazole (PRILOSEC) 40 MG capsule Take 40 mg by mouth 2 (two) times daily as needed (takes once daily and a 2nd dose if needed for acid reflux).   Yes [provider]  prednisoLONE 5 MG TABS tablet Take 5 mg by mouth daily.    Yes [provider]  predniSONE (DELTASONE) 5 MG tablet Take 5 mg by mouth daily with breakfast.    Yes [provider]  simvastatin (ZOCOR) 40 MG tablet Take 40 mg by  mouth daily at 6 PM.   Yes [provider]  sodium chloride (OCEAN) 0.65 % SOLN nasal spray Place 1 spray into both nostrils as needed for congestion.   Yes [provider]  tamsulosin (FLOMAX) 0.4 MG CAPS capsule Take 0.4 mg by mouth daily.   Yes [provider]  Tiotropium Bromide Monohydrate (SPIRIVA RESPIMAT) 2.5 MCG/ACT AERS Inhale 2 puffs into the lungs daily. 01/18/17  Yes Juanito Doom, MD  traMADol (ULTRAM) 50 MG tablet Take 100 mg by mouth daily as needed for severe pain.    Yes [provider]  traZODone (DESYREL) 50 MG tablet Take 50 mg by mouth at bedtime.   Yes [provider]     Review of Systems  Positive ROS: As above  All other systems have been reviewed and were otherwise negative with the exception of those mentioned in the HPI and as above.  Objective: Vital signs in last 24 hours: Temp:  [97.1 F (36.2 C)] 97.1 F (36.2 C) (05/24 0556) Pulse Rate:  [72] 72 (05/24 0556) Resp:  [20] 20 (05/24 0556) BP: (137)/(61) 137/61 (05/24 0556) SpO2:  [98 %] 98 % (05/24 0556) Weight:  [108.9 kg (240 lb)] 108.9 kg (240 lb) (05/24 0624)  General Appearance: Alert Head: Normocephalic, without obvious abnormality, atraumatic Eyes: PERRL, conjunctiva/corneas clear, EOM's intact,    Ears: Normal  Throat: Normal  Neck: Supple, Back: unremarkable Lungs: Clear to auscultation bilaterally, respirations unlabored Heart: Regular rate and rhythm, no murmur, rub or gallop Abdomen: Soft, non-tender Extremities: Extremities normal, atraumatic, no cyanosis or edema Skin: unremarkable  NEUROLOGIC:   Mental status: alert and oriented,Motor Exam - grossly normal Sensory Exam - grossly normal Reflexes:  Coordination - grossly normal Gait - grossly normal Balance - grossly normal Cranial Nerves: I: smell Not tested  II: visual acuity  OS: Normal  OD: Normal   II: visual fields Full to confrontation  II: pupils Equal, round,  reactive to light  III,VII: ptosis None  III,IV,VI: extraocular muscles  Full ROM  V: mastication Normal  V: facial light touch sensation  Normal  V,VII: corneal reflex  Present  VII: facial muscle function - upper  Normal  VII: facial muscle function - lower Normal  VIII: hearing Not tested  IX: soft palate elevation  Normal  IX,X: gag reflex Present  XI: trapezius strength  5/5  XI: sternocleidomastoid strength 5/5  XI: neck flexion strength  5/5  XII: tongue strength  Normal    Data Review Lab Results  Component Value Date   WBC 9.7 02/09/2017   HGB 13.6 02/09/2017   HCT 40.6  02/09/2017   MCV 110.3 (H) 02/09/2017   PLT 275 02/09/2017   Lab Results  Component Value Date   NA 136 02/09/2017   K 4.4 02/09/2017   CL 100 (L) 02/09/2017   CO2 29 02/09/2017   BUN 10 02/09/2017   CREATININE 0.96 02/09/2017   GLUCOSE 108 (H) 02/09/2017   No results found for: INR, PROTIME  Assessment/Plan: L2-3, 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 We have discussed the various treatment options including surgery. I have described the surgical treatment option of an L2-3, L3-4 and L4-5 decompression, instrumentation, and fusion. I have shown them surgical models. We have discussed the risks, benefits, alternatives, expected postoperative course, and likelihood of achieving our goals with surgery. I have answered all patient's questions. He has decided to proceed with surgery.   Marrell Dicaprio D 02/17/2017 7:24 AM

## 2017-02-18 ENCOUNTER — Inpatient Hospital Stay (HOSPITAL_COMMUNITY): Payer: Medicare Other

## 2017-02-18 DIAGNOSIS — Z419 Encounter for procedure for purposes other than remedying health state, unspecified: Secondary | ICD-10-CM

## 2017-02-18 DIAGNOSIS — R0902 Hypoxemia: Secondary | ICD-10-CM

## 2017-02-18 LAB — BASIC METABOLIC PANEL
ANION GAP: 7 (ref 5–15)
BUN: 9 mg/dL (ref 6–20)
CO2: 29 mmol/L (ref 22–32)
Calcium: 8.6 mg/dL — ABNORMAL LOW (ref 8.9–10.3)
Chloride: 100 mmol/L — ABNORMAL LOW (ref 101–111)
Creatinine, Ser: 0.93 mg/dL (ref 0.61–1.24)
GFR calc Af Amer: >60 mL/min (ref >60)
Glucose, Bld: 175 mg/dL — ABNORMAL HIGH (ref 65–99)
POTASSIUM: 4.1 mmol/L (ref 3.5–5.1)
SODIUM: 136 mmol/L (ref 135–145)

## 2017-02-18 LAB — CBC
HCT: 39 % (ref 39.0–52.0)
Hemoglobin: 12.5 g/dL — ABNORMAL LOW (ref 13.0–17.0)
MCH: 36.3 pg — AB (ref 26.0–34.0)
MCHC: 32.1 g/dL (ref 30.0–36.0)
MCV: 113.4 fL — ABNORMAL HIGH (ref 78.0–100.0)
Platelets: 249 10*3/uL (ref 150–400)
RBC: 3.44 MIL/uL — AB (ref 4.22–5.81)
RDW: 14.9 % (ref 11.5–15.5)
WBC: 13 10*3/uL — AB (ref 4.0–10.5)

## 2017-02-18 MED ORDER — PANTOPRAZOLE SODIUM 40 MG IV SOLR
40.0000 mg | Freq: Two times a day (BID) | INTRAVENOUS | Status: DC
  Administered 2017-02-18 – 2017-02-22 (×9): 40 mg via INTRAVENOUS
  Filled 2017-02-18 (×9): qty 40

## 2017-02-18 MED ORDER — ORAL CARE MOUTH RINSE
15.0000 mL | Freq: Two times a day (BID) | OROMUCOSAL | Status: DC
Start: 2017-02-18 — End: 2017-02-22
  Administered 2017-02-18 – 2017-02-22 (×8): 15 mL via OROMUCOSAL

## 2017-02-18 NOTE — Progress Notes (Addendum)
Notified by NT that O2 sats dropped and stayed between 80 to 82%. Currently on CPAP. Notified respiratory and rapid respond and paged Dr. Vertell Limber. Patient now maintaining O2 sats between 91 & 92% on 10cm water pressure and O2 at 12L. Dr. Vertell Limber called back and new orders placed to transfer to stepdown ICU and BiPAP per RT

## 2017-02-18 NOTE — Consult Note (Signed)
Name: [name] MRN: [id number] DOB: January 24, 1941    ADMISSION DATE:  02/17/2017 CONSULTATION DATE:  02/18/2017  REFERRING MD :  Dr. [name]  CHIEF COMPLAINT:  Hypoxia  HISTORY OF PRESENT ILLNESS:  76 year old male with PMH as below admitted and underwent surgery on 5/24 for bilateral L2-3, L3-4, and L4-5 laminotomy/ foraminotomies after failed outpatient medical management for chronic back and leg pain with DDD, spinal stenosis, lumbar scoliosis, lumbago, and lumbar radiculopathy.    He recently known to our practice, followed by Dr. Lake Bells, as of 10/2016 after moving from [place] in January 2018.  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  He reports he is compliant with his CPAP of 8 with no O2 requirements at night for the last 10-12 years, but has not needed any titrations.  Last sleep study greater than 10 years ago.   Overnight, he developed some hypoxia, documented around 80-82%, and possible increased work of breathing.  He was placed on BiPAP and transferred to SDU   Since, respiratory status has improved and patient now on nasal cannula.  PCCM consulted for hypoxia and to optimize pulmonary status.     PAST MEDICAL HISTORY :   has a past medical history of Cancer of prostate ([place]); COPD (chronic obstructive pulmonary disease) ([place]); Depression; Dyspnea; GERD (gastroesophageal reflux disease); H/O seasonal allergies; High cholesterol; History of kidney stones; Hypertension; Rheumatoid arteritis; and Sleep apnea.  has a past surgical history that includes Splenectomy (1955); Back surgery (2010); Total hip arthroplasty; Hip Arthroplasty (Left, 04/2016); Hernia repair; and Prostate biopsy. Prior to Admission medications   Medication Sig Start Date End Date Taking? Authorizing Provider  albuterol  (PROVENTIL HFA;VENTOLIN HFA) 108 (90 Base) MCG/ACT inhaler Inhale 2 puffs into the lungs every 6 (six) hours as needed for wheezing or shortness of breath.   Yes [provider]  DULoxetine (CYMBALTA) 30 MG capsule Take 30 mg by mouth every evening. Takes 60 mg in the morning and 30 mg in the evening   Yes [provider]  DULoxetine (CYMBALTA) 60 MG capsule Take 60 mg by mouth daily. Takes 60 mg in the morning and 30 mg in the evening   Yes [provider]  finasteride (PROSCAR) 5 MG tablet Take 5 mg by mouth daily.   Yes [provider]  fluticasone (FLONASE) 50 MCG/ACT nasal spray Place 2 sprays into both nostrils daily.   Yes [provider]  fluticasone furoate-vilanterol (BREO ELLIPTA) 100-25 MCG/INH AEPB Inhale 1 puff into the lungs daily. 01/18/17  Yes Juanito Doom, MD  folic acid (FOLVITE) 1 MG tablet Take 1 mg by mouth daily.   Yes [provider]  gabapentin (NEURONTIN) 300 MG capsule Take 600 mg by mouth 3 (three) times daily.   Yes [provider]  hydroxychloroquine (PLAQUENIL) 200 MG tablet Take 200 mg by mouth 2 (two) times daily.    Yes [provider]  ibuprofen (ADVIL,MOTRIN) 400 MG tablet Take 400-600 mg by mouth every 6 (six) hours as needed for mild pain (depends on pain if takes 400-600 mg).    Yes [provider]  InFLIXimab (REMICADE IV) Inject into the vein every 8 (eight) weeks.    Yes [provider]  lisinopril (PRINIVIL,ZESTRIL) 20 MG tablet Take 20 mg by mouth daily.  Yes [provider]  methotrexate 2.5 MG tablet Take 25 mg by mouth once a week. Saturday   Yes [provider]  metoprolol succinate (TOPROL-XL) 50 MG 24 hr tablet Take 50 mg by mouth every evening. Take with or immediately following a meal.   Yes [provider]  omeprazole (PRILOSEC) 40 MG capsule Take 40 mg by mouth 2 (two) times daily as needed (takes once daily and a 2nd dose if  needed for acid reflux).   Yes [provider]  prednisoLONE 5 MG TABS tablet Take 5 mg by mouth daily.    Yes [provider]  predniSONE (DELTASONE) 5 MG tablet Take 5 mg by mouth daily with breakfast.    Yes [provider]  simvastatin (ZOCOR) 40 MG tablet Take 40 mg by mouth daily at 6 PM.   Yes [provider]  sodium chloride (OCEAN) 0.65 % SOLN nasal spray Place 1 spray into both nostrils as needed for congestion.   Yes [provider]  tamsulosin (FLOMAX) 0.4 MG CAPS capsule Take 0.4 mg by mouth daily.   Yes [provider]  Tiotropium Bromide Monohydrate (SPIRIVA RESPIMAT) 2.5 MCG/ACT AERS Inhale 2 puffs into the lungs daily. 01/18/17  Yes Juanito Doom, MD  traMADol (ULTRAM) 50 MG tablet Take 100 mg by mouth daily as needed for severe pain.    Yes [provider]  traZODone (DESYREL) 50 MG tablet Take 50 mg by mouth at bedtime.   Yes [provider]   Allergies  Allergen Reactions  . Sulfa Antibiotics Shortness Of Breath  . Varenicline Other (See Comments)    Strange thoughts    FAMILY HISTORY:  family history includes Cancer in his mother; Heart disease in his father. SOCIAL HISTORY:  reports that he quit smoking about 2 years ago. His smoking use included Cigarettes. He has a 81.00 pack-year smoking history. He has never used smokeless tobacco. He reports that he drinks about 1.8 - 2.4 oz of alcohol per week . He reports that he does not use drugs.  REVIEW OF SYSTEMS:  POSITIVES IN BOLD Constitutional: Negative for fever, chills, weight loss, malaise/fatigue and diaphoresis.  HENT: Negative for hearing loss, ear pain, nosebleeds, congestion, sore throat, neck pain, tinnitus and ear discharge.   Eyes: Negative for blurred vision, double vision, photophobia, pain, discharge and redness.  Respiratory: Negative for cough, hemoptysis, sputum production, shortness of breath, wheezing and stridor.     Cardiovascular: Negative for chest pain, palpitations, orthopnea, claudication, leg swelling and PND.  Gastrointestinal: Negative for heartburn, nausea, vomiting, abdominal pain, diarrhea, constipation, blood in stool and melena.  Genitourinary: Negative for dysuria, urgency, frequency, hematuria and flank pain.  Musculoskeletal: Negative for myalgias, back pain, joint pain and falls.  Skin: Negative for itching and rash.  Neurological: Negative for dizziness, tingling, tremors, sensory change, speech change, focal weakness, seizures, loss of consciousness, weakness and headaches.  Endo/Heme/Allergies: Negative for environmental allergies and polydipsia. Does not bruise/bleed easily.  SUBJECTIVE:  Denies any present dyspnea - above his baseline Denies any chest pain Complains of 7/10 back pain   VITAL SIGNS: Temp:  [97.6 F (36.4 C)-98.8 F (37.1 C)] 98.1 F (36.7 C) (05/25 1201) Pulse Rate:  [82-108] 108 (05/25 1201) Resp:  [13-25] 25 (05/25 1201) BP: (116-157)/(66-88) 138/75 (05/25 1201) SpO2:  [91 %-100 %] 96 % (05/25 1201)  PHYSICAL EXAMINATION: General:  Pleasant, adult male lying mostly supine in bed in NAD HEENT: MM pink/moist, thick neck  PSY:  Calm and appropriate  Neuro: AAOx4, MAE  CV: s1s2 RR IR- ST 105 with PACs, no m/r/g PULM: shallow/non-labored, mildly tachypneic, lungs bilaterally clear, slightly diminished in bases, speaking full sentences, no wheezing, 98% on 3LNC GI: obese, soft, non-tender, bsx4 active  Extremities: warm/dry, no edema  Skin: no rashes or lesions, back not visualized   Recent Labs Lab 02/18/17 0422  NA 136  K 4.1  CL 100*  CO2 29  BUN 9  CREATININE 0.93  GLUCOSE 175*    Recent Labs Lab 02/18/17 0422  HGB 12.5*  HCT 39.0  WBC 13.0*  PLT 249   Dg Lumbar Spine 2-3 Views  Result Date: 02/17/2017 CLINICAL DATA:  Intraoperative fluoroscopy.  L2-L5 PLIF. EXAM: LUMBAR SPINE - 2-3 VIEW; DG C-ARM 61-120 MIN COMPARISON:  Lumbar  spine myelogram 11/04/2016 FINDINGS: Numbering is based on osteophytes. Status post L2-3, L3-4, L4-5 PLIF. Intervertebral cages and bilateral pedicle screws in place. No hardware displacement or osseous fracture noted. IMPRESSION: Fluoroscopy for L2-L5 PLIF.  No acute finding. Electronically Signed   By: Monte Fantasia M.D.   On: 02/17/2017 14:59   Dg Lumbar Spine 1 View  Result Date: 02/17/2017 CLINICAL DATA:  Lumbar fusion EXAM: LUMBAR SPINE - 1 VIEW COMPARISON:  11/04/2016 FINDINGS: Lateral view of the lumbar spine was obtained and reveals surgical retractors in the posterior soft tissues and a surgical instrument at the L3-4 level. The numbering nomenclature is similar to that used on the prior CT examination. Multilevel disc space narrowing is noted. IMPRESSION: Intraoperative localization at L3-4 Electronically Signed   By: Inez Catalina M.D.   On: 02/17/2017 10:11   Dg C-arm 1-60 Min  Result Date: 02/17/2017 CLINICAL DATA:  Intraoperative fluoroscopy.  L2-L5 PLIF. EXAM: LUMBAR SPINE - 2-3 VIEW; DG C-ARM 61-120 MIN COMPARISON:  Lumbar spine myelogram 11/04/2016 FINDINGS: Numbering is based on osteophytes. Status post L2-3, L3-4, L4-5 PLIF. Intervertebral cages and bilateral pedicle screws in place. No hardware displacement or osseous fracture noted. IMPRESSION: Fluoroscopy for L2-L5 PLIF.  No acute finding. Electronically Signed   By: Monte Fantasia M.D.   On: 02/17/2017 14:59   STUDIES:  PFTs- 06/2014 from [facility].  ratio 58% FEV1 1.54 L 57% predicted, FVC 2.65 L 70% predicted, total lung capacity 157% predicted residual volume 263% predicted DLCO 61% predicted.  ASSESSMENT / PLAN:  Acute on chronic hypoxic respiratory failure - in the setting of post-op atelectasis, OSA, acute pain, +/- possible aspiration with narcotic pain use, COPD Probable post-op atelectasis COPD - grade C, - baseline O2 sats in office 92% on room air, does not appear to be in acute exacerbation  Tachycardia- in  the setting of acute pain, ?urinary retention GERD-  Acute Pain Post-op spinal surgery Tobacco abuse  P: - wean O2 for goal SpO2 of 90-92% - CXR now - Aggressive pulmonary hygiene with IS and mobilize as able with PT per NSGY - Minimize pain/sedating meds as able - Use home CPAP while napping and at HS - Continue BDs, Breo, Spirvia  - continue protonix 31m q 12 - caution with betablockers and COPD - Ongoing electronic smoking cessation - Anticipate needing outpatient sleep study at some point, can discuss in office.  Patient, his wife, daughter and bedside RN updated on plan of care.     BKennieth Rad AGACNP-BC [facility] Pgr: [phone] if no answer [phone]/25/2018, 3:29 PM   STAFF NOTE: I, DMerrie Roof MD FACP have personally reviewed patient's available data, including  medical history, events of note, physical examination and test results as part of my evaluation. I have discussed with resident/NP and other care providers such as pharmacist, RN and RRT. In addition, I personally evaluated patient and elicited key findings of: awake, flat in bed, no distress, speaking full sentences, lungs are clear anterior, reduced bases, abdo soft distended,d obese, no sig edema, history obtained by me from pt and wife who was at bedside last night, pt had hypoxia associated with morphine administration/ narcotics, he was on his abdomen proned for procedure same day as well, he suffes wit hosa cpap 8 stable settings although no sleep study in about 10 years, no fevers, no ches tpain, he dies describe a lump in his throat as he has NOT taken his ppi pre op, I think this is likely Atalectasis associated with position and effort reduced by narcs in setting gerd exacerbation [plan as d/w family and pt is , stat pcxr, O2 support, No changes in cpap settings unless his WOB increases, ppi IV to bid, IS aggressive, low threshold lasix in future, and avoidance as able with  narcs, have d/w pt and RN use first of tram, tylenal and then flexaril, then narcsa if fails, will follow up in am    Lavon Paganini. Titus Mould, MD, [place] Pgr: [facility] 02/18/2017 7:04 PM

## 2017-02-18 NOTE — Progress Notes (Signed)
02/18/2017- Respiratory care note- RT called to room due to pt desat in the 80's. CPAP pressure increased to 10 and oxygen increased to 12 LPM.  Sats increased to 91-93%.  RN made aware and MD called for transfer orders to more closely monitor pt and possibly place pt on BIPAP.

## 2017-02-18 NOTE — Progress Notes (Signed)
[name], male   DOB: [birth year], 76 y.o.   MRN: [id number] Subjective:  The patient is alert and pleasant. He is in no apparent distress. His back is appropriately sore. He denies headaches.  Objective: Vital signs in last 24 hours: Temp:  [97.6 F (36.4 C)-98.8 F (37.1 C)] 97.9 F (36.6 C) (05/25 0257) Pulse Rate:  [82-108] 99 (05/25 0337) Resp:  [13-24] 18 (05/25 0337) BP: (116-157)/(66-88) 148/82 (05/25 0257) SpO2:  [91 %-100 %] 100 % (05/25 0337)  Intake/Output from previous day: 05/24 0701 - 05/25 0700 In: 3650 [P.O.:200; I.V.:3200; IV Piggyback:250] Out: 1324 [Urine:3120; Blood:400] Intake/Output this shift: No intake/output data recorded.  Physical exam the patient is alert and oriented. He is moving his lower extremities well. His dressing is clean and dry.  Lab Results:  Recent Labs  02/18/17 0422  WBC 13.0*  HGB 12.5*  HCT 39.0  PLT 249   BMET  Recent Labs  02/18/17 0422  NA 136  K 4.1  CL 100*  CO2 29  GLUCOSE 175*  BUN 9  CREATININE 0.93  CALCIUM 8.6*    Studies/Results: Dg Lumbar Spine 2-3 Views  Result Date: 02/17/2017 CLINICAL DATA:  Intraoperative fluoroscopy.  L2-L5 PLIF. EXAM: LUMBAR SPINE - 2-3 VIEW; DG C-ARM 61-120 MIN COMPARISON:  Lumbar spine myelogram 11/04/2016 FINDINGS: Numbering is based on osteophytes. Status post L2-3, L3-4, L4-5 PLIF. Intervertebral cages and bilateral pedicle screws in place. No hardware displacement or osseous fracture noted. IMPRESSION: Fluoroscopy for L2-L5 PLIF.  No acute finding. Electronically Signed   By: Monte Fantasia M.D.   On: 02/17/2017 14:59   Dg Lumbar Spine 1 View  Result Date: 02/17/2017 CLINICAL DATA:  Lumbar fusion EXAM: LUMBAR SPINE - 1 VIEW COMPARISON:  11/04/2016 FINDINGS: Lateral view of the lumbar spine was obtained and reveals surgical retractors in the posterior soft tissues and a surgical instrument at the L3-4 level. The numbering nomenclature is similar to that used  on the prior CT examination. Multilevel disc space narrowing is noted. IMPRESSION: Intraoperative localization at L3-4 Electronically Signed   By: Inez Catalina M.D.   On: 02/17/2017 10:11   Dg C-arm 1-60 Min  Result Date: 02/17/2017 CLINICAL DATA:  Intraoperative fluoroscopy.  L2-L5 PLIF. EXAM: LUMBAR SPINE - 2-3 VIEW; DG C-ARM 61-120 MIN COMPARISON:  Lumbar spine myelogram 11/04/2016 FINDINGS: Numbering is based on osteophytes. Status post L2-3, L3-4, L4-5 PLIF. Intervertebral cages and bilateral pedicle screws in place. No hardware displacement or osseous fracture noted. IMPRESSION: Fluoroscopy for L2-L5 PLIF.  No acute finding. Electronically Signed   By: Monte Fantasia M.D.   On: 02/17/2017 14:59    Assessment/Plan: Postop day #1: The patient is doing well neurologically. We will elevate his head of bed. Will begin mobilizing him tomorrow because of the CSF leak.  Hypoxia, sleep apnea: I will ask critical care medicine to see the patient to help [place] optimize his lungs.  The patient lives at [facility] and would like to go into the rehabilitation unit there. I will ask care management to arrange this.  The patient will likely be able to go home over the weekend. I gave the patient and his wife discharge instructions and answered all their questions.  LOS: 1 day     Stephanye Finnicum D 02/18/2017, 7:06 AM

## 2017-02-18 NOTE — Significant Event (Signed)
Rapid Response Event Note RN called for O2 sats 80-82% on cpap  Overview: Time Called: 0120 Arrival Time: 0123 Event Type: Respiratory  Initial Focused Assessment: On arrival pt lying supine in bed, breathing even and unlabored, alert and oriented x4, O2 sats 91-92% on10 cm water pressure, 12L O2. Dr. Vertell Limber paged PTA. New orders to place pt on bipap and transfer to SDU  Interventions: Place on Bipap and transfer to SDU    Event Summary: Name of Physician Notified: Dr. Vertell Limber  at [place]    at    Outcome: Transferred ([name])     Gevena Mart, Sela Hua

## 2017-02-18 NOTE — Progress Notes (Signed)
Orthopedic Tech Progress Note Patient Details:  [name] 1941/02/03 [id number]  [name], male   DOB: 1941-08-25, 76 y.o.   MRN: [id number]   [name] 02/18/2017, 9:27 [facility] [employer] for Lumbar corset brace.

## 2017-02-18 NOTE — Progress Notes (Signed)
PT Cancellation Note  Patient Details Name: [name] MRN: [id number] DOB: December 08, 1940   Cancelled Treatment:    Reason Eval/Treat Not Completed: Other (comment).  Orders received.  Pt is on bedrest today due to CSF leak.  Physician wants to wait to mobilize pt until tomorrow 02/19/17.  PT will check back tomorrow.  Thanks,    Barbarann Ehlers. [place], [place], DPT [phone]   02/18/2017, 10:34 AM

## 2017-02-18 NOTE — Progress Notes (Signed)
Gave report to [facility], ICU RN, d/t patient being transferred to 4 East bed 21. Asked the SWAT RN, Jenny Reichmann, to help transfer the patient. Sherry, RT, has not left the patient since his O2 sats dropped. She stated that she was placing the patient on nonrebreather during the transfer to stepdown ICU.

## 2017-02-19 NOTE — Progress Notes (Signed)
Subjective: Patient resting comfortably in bed. Has been on strict bedrest per Dr. [name]. Therefore no physical therapy has been performed. Dr. [name]' note from yesterday indicates patient is to begin to mobilize today. Orders written.  Objective: Vital signs in last 24 hours: Vitals:   02/19/17 0000 02/19/17 0412 02/19/17 0800 02/19/17 0838  BP: (!) 156/71 135/77 127/65   Pulse:  92 70   Resp: (!) 26 (!) 25 (!) 25   Temp:  98.3 F (36.8 C) 97.7 F (36.5 C)   TempSrc:  Oral Oral   SpO2: 90% 96% 91% 90%  Weight:      Height:        Intake/Output from previous day: 05/25 0701 - 05/26 0700 In: -  Out: 2425 [Urine:2425] Intake/Output this shift: No intake/output data recorded.  Physical Exam:  Awake alert, following commands. Moving all extremities well. Dressing clean and dry.  CBC  Recent Labs  02/18/17 0422  WBC 13.0*  HGB 12.5*  HCT 39.0  PLT 249   BMET  Recent Labs  02/18/17 0422  NA 136  K 4.1  CL 100*  CO2 29  GLUCOSE 175*  BUN 9  CREATININE 0.93  CALCIUM 8.6*    Assessment/Plan: Doing well following surgery. PT to begin today. Request for OT ordered today. Bed rest ordered discontinued.   Hosie Spangle, MD 02/19/2017, 9:39 AM

## 2017-02-19 NOTE — Progress Notes (Signed)
PT Cancellation Note  Patient Details Name: [name] MRN: [id number] DOB: 1941/04/28   Cancelled Treatment:    Reason Eval/Treat Not Completed: Medical issues which prohibited therapy (Pt continues to have bedrest orders.)Will check back to see if MD removes bedrest orders later in am.  Thanks.    Denice Paradise 02/19/2017, 8:35 AM [facility] Acute Rehabilitation [phone] (pager)

## 2017-02-19 NOTE — Plan of Care (Signed)
Problem: Activity: Goal: Ability to avoid complications of mobility impairment will improve Outcome: Progressing iscussed the importance of turning and mobility in the bed with some teach back displayed  Comments: Topic of turning of his back and bottom to relieve the pressure when back brace is off in the bed

## 2017-02-19 NOTE — Evaluation (Addendum)
Physical Therapy Evaluation Patient Details Name: [name] MRN: [id number] DOB: 05-Jul-1941 Today's Date: 02/19/2017   History of Present Illness  76 year old male with PMH as below admitted and underwent surgery on 5/24 for bilateral L2-3, L3-4, and L4-5 laminotomy/ foraminotomies after failed outpatient medical management for chronic back and leg pain with DDD, spinal stenosis, lumbar scoliosis, lumbago, and lumbar radiculopathy. He did have some hypoxia on 5/24 in the late evening, documented around 80-82%, and possible increased work of breathing.  He was placed on BiPAP and transferred to SDU   Since, respiratory status has improved and patient now on nasal cannula.  Pt with suspected CSF leak and was on bedrest post op on 5/25. 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  He reports he is compliant with his CPAP of 8 with no O2 requirements at night for the last 10-12 years, but has not needed any titrations.  Last sleep study greater than 10 years ago. He did have some hypoxia on 5/24, documented around 80-82%, and possible increased work of breathing.  He was placed on BiPAP and transferred to SDU   Since, respiratory status has improved and patient now on nasal cannula.    Clinical Impression  Pt admitted with above diagnosis. Pt currently with functional limitations due to the deficits listed below (see PT Problem List). Pt was able to ambulate with RW with min guard assist.  Needs min assist to come to standing.  Pt would benefit from rehab at facility and wife states it is bring arranged. Will follow acutely.  Pt will benefit from skilled PT to increase their independence and safety with mobility to allow discharge to the venue listed below.      Follow Up Recommendations SNF;Supervision/Assistance - 24  hour (wife states rehab at facility)    Equipment Recommendations  None recommended by PT    Recommendations for Other Services       Precautions / Restrictions Precautions Precautions: Fall;Back Precaution Booklet Issued: Yes (comment) Precaution Comments: discussed back precautions at length with pt and wife.  Required Braces or Orthoses: Spinal Brace Spinal Brace: Lumbar corset;Applied in standing position Restrictions Weight Bearing Restrictions: No      Mobility  Bed Mobility Overal bed mobility: Needs Assistance Bed Mobility: Rolling;Sidelying to Sit Rolling: Supervision Sidelying to sit: Min assist       General bed mobility comments: Pt and wife taught log roll with pt doing it correctly. slow but did well.   Transfers Overall transfer level: Needs assistance Equipment used: Rolling walker (2 wheeled) Transfers: Sit to/from Stand Sit to Stand: Min guard         General transfer comment: Pt needed cues for hand placement and to power up. STeadying assist once up.   Ambulation/Gait Ambulation/Gait assistance: Min guard Ambulation Distance (Feet): 85 Feet Assistive device: Rolling walker (2 wheeled) Gait Pattern/deviations: Step-through pattern;Decreased stride length;Antalgic;Trunk flexed   Gait velocity interpretation: Below normal speed for age/gender General Gait Details: Pt ambulated well with RW overall.  Occasional cues to stay close to RW and sequence steps and RW.   Stairs            Wheelchair Mobility    Modified Rankin (Stroke Patients Only)       Balance Overall balance assessment: Needs assistance Sitting-balance support: No  upper extremity supported;Feet supported Sitting balance-Leahy Scale: Good     Standing balance support: Bilateral upper extremity supported;During functional activity Standing balance-Leahy Scale: Fair Standing balance comment: Pt can remove hands from RW standing for brief episodes.                               Pertinent Vitals/Pain Pain Assessment: 0-10 Pain Score: 7  Pain Location: back Pain Descriptors / Indicators: Aching;Grimacing;Guarding;Operative site guarding Pain Intervention(s): Limited activity within patient's tolerance;Monitored during session;Premedicated before session;Repositioned  VSS  Home Living Family/patient expects to be discharged to:: Private residence Living Arrangements: Spouse/significant other Available Help at Discharge: Family;Available 24 hours/day Type of Home: Independent living facility Home Access: Level entry     Home Layout: One level Home Equipment: Cane - single point;Walker - 2 wheels;Grab bars - tub/shower;Shower seat;Adaptive equipment      Prior Function Level of Independence: Independent               Hand Dominance        Extremity/Trunk Assessment   Upper Extremity Assessment Upper Extremity Assessment: Defer to OT evaluation    Lower Extremity Assessment Lower Extremity Assessment: Generalized weakness    Cervical / Trunk Assessment Cervical / Trunk Assessment: Normal  Communication   Communication: No difficulties  Cognition Arousal/Alertness: Awake/alert Behavior During Therapy: WFL for tasks assessed/performed Overall Cognitive Status: Within Functional Limits for tasks assessed                                        General Comments General comments (skin integrity, edema, etc.): Pt and wife educated how to don and doff brace.  Pt MD states to place in standing.     Exercises     Assessment/Plan    PT Assessment Patient needs continued PT services  PT Problem List Decreased activity tolerance;Decreased balance;Decreased mobility;Decreased knowledge of use of DME;Decreased safety awareness;Decreased knowledge of precautions;Pain       PT Treatment Interventions DME instruction;Gait training;Functional mobility training;Therapeutic activities;Therapeutic exercise;Balance  training;Patient/family education    PT Goals (Current goals can be found in the Care Plan section)  Acute Rehab PT Goals Patient Stated Goal: to go home PT Goal Formulation: With patient Time For Goal Achievement: 02/26/17 Potential to Achieve Goals: Good    Frequency Min 6X/week   Barriers to discharge        Co-evaluation               AM-PAC PT "6 Clicks" Daily Activity  Outcome Measure Difficulty turning over in bed (including adjusting bedclothes, sheets and blankets)?: Total Difficulty moving from lying on back to sitting on the side of the bed? : Total Difficulty sitting down on and standing up from a chair with arms (e.g., wheelchair, bedside commode, etc,.)?: A Lot Help needed moving to and from a bed to chair (including a wheelchair)?: A Little Help needed walking in hospital room?: A Little Help needed climbing 3-5 steps with a railing? : A Lot 6 Click Score: 12    End of Session Equipment Utilized During Treatment: Gait belt;Back brace Activity Tolerance: Patient tolerated treatment well Patient left: in chair;with call bell/phone within reach;with family/visitor present Nurse Communication: Mobility status PT Visit Diagnosis: Unsteadiness on feet (R26.81);Muscle weakness (generalized) (M62.81);Pain Pain - part of body:  (back)    Time: 1105-1140 PT Time  Calculation (min) (ACUTE ONLY): 35 min   Charges:   PT Evaluation $PT Eval Moderate Complexity: 1 Procedure PT Treatments $Gait Training: 8-22 mins   PT G Codes:        Ajeenah Heiny,PT Acute Rehabilitation [phone] (pager)   Denice Paradise 02/19/2017, 3:49 PM

## 2017-02-20 NOTE — Progress Notes (Signed)
No issues overnight. Has appropriate back pain. Not noticed any drainage from wound. Sitting in bedside chair this am.  EXAM:  BP 139/76   Pulse 88   Temp 98 F (36.7 C) (Oral)   Resp 18   Ht 5' 7" (1.702 m)   Wt 108.9 kg (240 lb)   SpO2 95%   BMI 37.59 kg/m   Awake, alert, oriented  Speech fluent, appropriate  CN grossly intact  5/5 BUE/BLE  Wound c/d/i, no leak  IMPRESSION:  76 y.o. male s/p L2-5 PLIF, recovering as expected  PLAN: - Will plan on transfer to [facility] unit when bed available. - Cont to mobilize as tolerated

## 2017-02-20 NOTE — Progress Notes (Signed)
Physical Therapy Treatment Patient Details Name: [name] MRN: [id number] DOB: Nov 02, 1940 Today's Date: 02/20/2017    History of Present Illness 76 year old male with PMH as below admitted and underwent surgery on 5/24 for bilateral L2-3, L3-4, and L4-5 laminotomy/ foraminotomies after failed outpatient medical management for chronic back and leg pain with DDD, spinal stenosis, lumbar scoliosis, lumbago, and lumbar radiculopathy.  He recently known to our practice, followed by Dr. Lake Bells, as of 10/2016 after moving from [place] in January 2018.  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  He reports he is compliant with his CPAP of 8 with no O2 requirements at night for the last 10-12 years, but has not needed any titrations.  Last sleep study greater than 10 years ago. Overnight, he developed some hypoxia, documented around 80-82%, and possible increased work of breathing.  He was placed on BiPAP and transferred to SDU   Since, respiratory status has improved and patient now on nasal cannula.      PT Comments    Pt presents with a slight increase in pain since last therapy session. Performed bed mobility and gait training with same amount of assistance, but increased gait distance. Pt continues to require minimal cues for maintaining back precautions with mobility. Pt continues to benefit from SNF at discharge.     Follow Up Recommendations  SNF;Supervision/Assistance - 24 hour     Equipment Recommendations  None recommended by PT    Recommendations for Other Services       Precautions / Restrictions Precautions Precautions: Fall;Back Precaution Booklet Issued: Yes (comment) Precaution Comments: discussed back precautions at length with pt and wife.  Required Braces or Orthoses: Spinal  Brace Spinal Brace: Lumbar corset;Applied in standing position Restrictions Weight Bearing Restrictions: No    Mobility  Bed Mobility Overal bed mobility: Needs Assistance Bed Mobility: Rolling;Sidelying to Sit Rolling: Supervision Sidelying to sit: Min assist       General bed mobility comments: MIn A to bring trunk upright at EOB  Transfers Overall transfer level: Needs assistance Equipment used: Rolling walker (2 wheeled) Transfers: Sit to/from Stand Sit to Stand: Min guard         General transfer comment: Pt requires cues for hand placement to stand and to steady once upright  Ambulation/Gait Ambulation/Gait assistance: Min guard Ambulation Distance (Feet): 75 Feet Assistive device: Rolling walker (2 wheeled) Gait Pattern/deviations: Step-through pattern;Decreased stride length;Antalgic;Trunk flexed     General Gait Details: MIld antalgic gait this session with increased c/o bilateral hip pain. good positioning within RW   Stairs            Wheelchair Mobility    Modified Rankin (Stroke Patients Only)       Balance Overall balance assessment: Needs assistance Sitting-balance support: No upper extremity supported;Feet supported Sitting balance-Leahy Scale: Good     Standing balance support: Bilateral upper extremity supported;During functional activity Standing balance-Leahy Scale: Fair Standing balance comment: Pt can remove hands from RW standing for brief episodes.                             Cognition Arousal/Alertness: Awake/alert Behavior During Therapy: WFL for tasks assessed/performed Overall Cognitive Status: Within Functional Limits for tasks assessed  Exercises      General Comments        Pertinent Vitals/Pain Pain Assessment: 0-10 Pain Score: 5  Pain Location: back Pain Descriptors / Indicators: Aching;Grimacing;Guarding;Operative site guarding Pain  Intervention(s): Monitored during session;Premedicated before session;Repositioned    Home Living                      Prior Function            PT Goals (current goals can now be found in the care plan section) Acute Rehab PT Goals Patient Stated Goal: to go home Progress towards PT goals: Progressing toward goals    Frequency    Min 6X/week      PT Plan Current plan remains appropriate    Co-evaluation              AM-PAC PT "6 Clicks" Daily Activity  Outcome Measure  Difficulty turning over in bed (including adjusting bedclothes, sheets and blankets)?: Total Difficulty moving from lying on back to sitting on the side of the bed? : Total Difficulty sitting down on and standing up from a chair with arms (e.g., wheelchair, bedside commode, etc,.)?: A Lot Help needed moving to and from a bed to chair (including a wheelchair)?: A Little Help needed walking in hospital room?: A Little Help needed climbing 3-5 steps with a railing? : A Lot 6 Click Score: 12    End of Session Equipment Utilized During Treatment: Gait belt;Back brace Activity Tolerance: Patient tolerated treatment well Patient left: in bed;with call bell/phone within reach;with family/visitor present;with SCD's reapplied Nurse Communication: Mobility status PT Visit Diagnosis: Unsteadiness on feet (R26.81);Muscle weakness (generalized) (M62.81);Pain Pain - part of body:  (back)     Time: 6950-7225 PT Time Calculation (min) (ACUTE ONLY): 23 min  Charges:  $Gait Training: 8-22 mins $Therapeutic Activity: 8-22 mins                    G Codes:       Scheryl Marten PT, DPT  [phone]    Jacqulyn Liner Sloan Leiter 02/20/2017, 1:46 PM

## 2017-02-20 NOTE — Progress Notes (Signed)
Paged MD to request Miralax for pt. Will continue to monitor.

## 2017-02-20 NOTE — Progress Notes (Signed)
Name: [name] MRN: [id number] DOB: [birth year]    ADMISSION DATE:  02/17/2017 CONSULTATION DATE:  02/18/2017  REFERRING MD :  Dr. [name]  CHIEF COMPLAINT:  Hypoxia  HISTORY OF PRESENT ILLNESS:  76 year old male with PMH as below admitted and underwent surgery on 5/24 for bilateral L2-3, L3-4, and L4-5 laminotomy/ foraminotomies after failed outpatient medical management for chronic back and leg pain with DDD, spinal stenosis, lumbar scoliosis, lumbago, and lumbar radiculopathy.    He recently known to our practice, followed by Dr. Lake Bells, as of 10/2016 after moving from [place] in January 2018.  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  He reports he is compliant with his CPAP of 8 with no O2 requirements at night for the last 10-12 years, but has not needed any titrations.  Last sleep study greater than 10 years ago.       SUBJECTIVE:  nad  But still feels lump in throat on dpi's and acei and gerd rx    VITAL SIGNS: Temp:  [97.8 F (36.6 C)-98.3 F (36.8 C)] 97.8 F (36.6 C) (05/27 0748) Pulse Rate:  [64-101] 101 (05/27 0748) Resp:  [12-29] 12 (05/27 0748) BP: (118-143)/(61-84) 143/84 (05/27 0748) SpO2:  [88 %-98 %] 93 % (05/27 0854)  PHYSICAL EXAMINATION: General:  Pleasant, obese wm nad at 30 degrees HOB RA  HEENT: MM pink/moist, thick neck  PSY:  Calm and appropriate  Neuro: AAOx4, MAE  CV: s1s2 RR IR- ST 105 with PACs, no m/r/g PULM:  Distant bs/ no wheeze GI: abd obese, soft, non-tender, bsx4 active  Extremities: warm/dry, no edema  Skin: no rashes or lesions, back not visualized   Recent Labs Lab 02/18/17 0422  NA 136  K 4.1  CL 100*  CO2 29  BUN 9  CREATININE 0.93  GLUCOSE 175*    Recent Labs Lab 02/18/17 0422  HGB 12.5*  HCT 39.0  WBC 13.0*  PLT 249     I personally reviewed images and agree with radiology impression as follows:   :    Dg Chest Port 1 View  Result Date: 02/18/2017 CLINICAL DATA:  Hypoxia, shortness of Breath EXAM: PORTABLE CHEST 1 VIEW COMPARISON:  07/31/2009 FINDINGS: Cardiomegaly. Tortuosity of the thoracic aorta. There are bibasilar opacities, likely atelectasis. Mild vascular congestion. No effusions or acute bony abnormality. IMPRESSION: Cardiomegaly, vascular congestion. Bibasilar atelectasis. Electronically Signed   By: Rolm Baptise M.D.   On: 02/18/2017 15:19   STUDIES:  PFTs- 06/2014 from [place].  ratio 58% FEV1 1.54 L 57% predicted, FVC 2.65 L 70% predicted, total lung capacity 157% predicted residual volume 263% predicted DLCO 61% predicted.  ASSESSMENT / PLAN:  Acute on chronic hypoxic respiratory failure - in the setting of post-op atelectasis, OSA, acute pain, +/- possible aspiration with narcotic pain use, COPD Probable post-op atelectasis COPD - GOLD II, - baseline O2 sats in office 92% on room air, does not appear to be in acute exacerbation  Tachycardia- in the setting of acute pain, ?urinary retention GERD-  Acute Pain Post-op spinal surgery Upper airway cough syndrome    - Upper airway cough syndrome (previously labeled PNDS) , is  so named because it's frequently impossible to sort out how much is  CR/sinusitis with freq throat clearing (which can be related to primary GERD)  vs  causing  secondary (" extra esophageal")  GERD from wide swings in gastric pressure that occur with throat clearing, often  promoting self use of mint and menthol lozenges that reduce the lower esophageal sphincter tone and exacerbate the problem further in a cyclical fashion.   These are the same pts (now being labeled as having "irritable larynx syndrome" by some cough centers) who not infrequently have a history of having failed to tolerate ace inhibitors,  dry powder inhalers or biphosphonates or report having  atypical/extraesophageal reflux symptoms that don't respond to standard doses of PPI and are easily confused as having aecopd or asthma flares by even experienced allergists/ pulmonologists (myself included).    P:   -  Continue ggressive pulmonary hygiene with IS and mobilize as able with PT per NSGY - Minimize pain/sedating meds as able - Use home CPAP while napping and at HS - Continue BDs, Breo, Spirvia > would strongly consider changes to [facility] if sensation of throat congestion does not improve off ACDEi  - continue protonix 21m q 12 - caution with betablockers and COPD - Ongoing electronic smoking cessation - Anticipate needing outpatient sleep study at some point, can discuss in office - Trial off ACEi and on ARB >  Fm advised > Follow up per Primary Care planned     [facility]   MChristinia Gully MD Pulmonary and [phone] 5:30 PM or weekends, use [facility]per [phone]

## 2017-02-21 NOTE — Clinical Social Work Note (Signed)
Clinical Social Work Assessment  Patient Details  Name: [name] MRN: [id number] Date of Birth: 1941/06/17  Date of referral:  02/21/17               Reason for consult:  Facility Placement, Discharge Planning                Permission sought to share information with:  Family Supports, Customer service manager Permission granted to share information::  Yes, Verbal Permission Granted  Name::     [place]::  [place]  Relationship::  Spouse   Contact Information:  [phone]   Housing/Transportation Living arrangements for the past 2 months:  [place] of Information:  Patient, Spouse Patient Interpreter Needed:  None Criminal Activity/Legal Involvement Pertinent to Current Situation/Hospitalization:  No - Comment as needed Significant Relationships:  Adult Children, Spouse Lives with:  Spouse Do you feel safe going back to the place where you live?  Yes Need for family participation in patient care:  No (Coment)  Care giving concerns: Patient lives in [place], [place], with spouse. Patient had surgery and is dependent with mobility. PT recommends SNF.   Social Worker assessment / plan: CSW spoke to patient and patient's spouse at bedside. Patient and spouse prefer to return to [facility], though would want to transition to skilled nursing portion of the facility. CSW will follow up with [facility] and confirm skilled nursing availability. CSW will continue to follow and support in discharge planning.  Employment status:  Retired Forensic scientist:  Programmer, applications ([employer]) PT Recommendations:  [place] / Referral to community resources:  [place]  Patient/Family's Response to care: Patient and spouse are amenable to discharge to SNF and prefer [facility]. Patient understanding of CSW role and discharge process.  Patient/Family's Understanding of and  Emotional Response to Diagnosis, Current Treatment, and Prognosis: Patient and spouse with good understanding of patient's medical condition and prognosis for recovery. Patient and spouse hopeful for patient's continued recovery in rehab.   Emotional Assessment Appearance:  Appears stated age Attitude/Demeanor/Rapport:  Other (appropriate) Affect (typically observed):  Pleasant, Hopeful Orientation:  Oriented to Self, Oriented to Place, Oriented to  Time, Oriented to Situation Alcohol / Substance use:  Not Applicable Psych involvement (Current and /or in the community):  No (Comment)  Discharge Needs  Concerns to be addressed:  Discharge Planning Concerns, Care Coordination Readmission within the last 30 days:  No Current discharge risk:  Dependent with Mobility Barriers to Discharge:  Continued Medical Work up   Estanislado Emms, LCSW 02/21/2017, 11:11 AM

## 2017-02-21 NOTE — Progress Notes (Signed)
Physical Therapy Treatment Patient Details Name: [name] MRN: [id number] DOB: Feb 01, 1941 Today's Date: 02/21/2017    History of Present Illness 76 year old male with PMH as below admitted and underwent surgery on 5/24 for bilateral L2-3, L3-4, and L4-5 laminotomy/ foraminotomies after failed outpatient medical management for chronic back and leg pain with DDD, spinal stenosis, lumbar scoliosis, lumbago, and lumbar radiculopathy.  He recently known to our practice, followed by Dr. Lake Bells, as of 10/2016 after moving from [place] in January 2018.  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  He reports he is compliant with his CPAP of 8 with no O2 requirements at night for the last 10-12 years, but has not needed any titrations.  Last sleep study greater than 10 years ago. Overnight, he developed some hypoxia, documented around 80-82%, and possible increased work of breathing.  He was placed on BiPAP and transferred to SDU   Since, respiratory status has improved and patient now on nasal cannula.      PT Comments    Patient required less assist for transfers but limited by pain with ambulation. Pt's HR up to 132 with mobility. Pt able to recall 3/3 precautions beginning of session. Continue to progress as tolerated with anticipated d/c to SNF for further skilled PT services.     Follow Up Recommendations  SNF;Supervision/Assistance - 24 hour     Equipment Recommendations  None recommended by PT    Recommendations for Other Services       Precautions / Restrictions Precautions Precautions: Fall;Back Precaution Comments: pt able to recall 3/3 precautions Required Braces or Orthoses: Spinal Brace Spinal Brace: Lumbar corset;Applied in standing position Restrictions Weight Bearing Restrictions: No     Mobility  Bed Mobility Overal bed mobility: Needs Assistance Bed Mobility: Rolling;Sit to Sidelying Rolling: Supervision       Sit to sidelying: Mod assist General bed mobility comments: cues for sequencing and assist to bring bilat LE into bed  Transfers Overall transfer level: Needs assistance Equipment used: Rolling walker (2 wheeled) Transfers: Sit to/from Stand Sit to Stand: Min guard         General transfer comment: pt with carry over of safe hand placement and good adherence to back precautions  Ambulation/Gait Ambulation/Gait assistance: Min guard Ambulation Distance (Feet): 18 Feet Assistive device: Rolling walker (2 wheeled) Gait Pattern/deviations: Step-through pattern;Decreased stride length;Antalgic Gait velocity: decreased   General Gait Details: pt with antalgic gait due to c/o L LE pain that increased with ambulation; cues for posture   Stairs            Wheelchair Mobility    Modified Rankin (Stroke Patients Only)       Balance Overall balance assessment: Needs assistance Sitting-balance support: No upper extremity supported;Feet supported Sitting balance-Leahy Scale: Good     Standing balance support: Bilateral upper extremity supported;During functional activity Standing balance-Leahy Scale: Fair Standing balance comment: pt is able to static stand without UE support                            Cognition Arousal/Alertness: Awake/alert Behavior During Therapy: WFL for tasks assessed/performed Overall Cognitive Status: Within Functional Limits for tasks assessed  Exercises      General Comments General comments (skin integrity, edema, etc.): HR up to 132 with mobility      Pertinent Vitals/Pain Pain Assessment: 0-10 Pain Score: 8  Pain Location: L anterior thigh and back of ankle Pain Descriptors / Indicators: Aching;Grimacing;Guarding;Sharp Pain  Intervention(s): Limited activity within patient's tolerance;Monitored during session;Premedicated before session;Repositioned    Home Living                      Prior Function            PT Goals (current goals can now be found in the care plan section) Acute Rehab PT Goals Patient Stated Goal: to go home Progress towards PT goals: Progressing toward goals    Frequency    Min 6X/week      PT Plan Current plan remains appropriate    Co-evaluation              AM-PAC PT "6 Clicks" Daily Activity  Outcome Measure  Difficulty turning over in bed (including adjusting bedclothes, sheets and blankets)?: Total Difficulty moving from lying on back to sitting on the side of the bed? : Total Difficulty sitting down on and standing up from a chair with arms (e.g., wheelchair, bedside commode, etc,.)?: A Lot Help needed moving to and from a bed to chair (including a wheelchair)?: A Little Help needed walking in hospital room?: A Little Help needed climbing 3-5 steps with a railing? : A Lot 6 Click Score: 12    End of Session Equipment Utilized During Treatment: Gait belt;Back brace Activity Tolerance: Patient limited by pain Patient left: in bed;with call bell/phone within reach;with family/visitor present;with SCD's reapplied Nurse Communication: Mobility status PT Visit Diagnosis: Unsteadiness on feet (R26.81);Muscle weakness (generalized) (M62.81);Pain Pain - part of body:  (back)     Time: 4462-8638 PT Time Calculation (min) (ACUTE ONLY): 36 min  Charges:  $Gait Training: 8-22 mins $Therapeutic Activity: 8-22 mins                    G Codes:       Earney Navy, PTA Pager: [phone]     Darliss Cheney 02/21/2017, 4:27 PM

## 2017-02-21 NOTE — Progress Notes (Signed)
No issues overnight. C/O back pain limiting his rest during night. Able to get up to use bathroom.  EXAM:  BP (!) 122/92 (BP Location: Left Arm)   Pulse (!) 107   Temp 98.3 F (36.8 C) (Oral)   Resp 20   Ht 5' 7" (1.702 m)   Wt 108.9 kg (240 lb)   SpO2 95%   BMI 37.59 kg/m   Awake, alert, oriented  Speech fluent, appropriate  CN grossly intact  5/5 BUE/BLE   IMPRESSION:  76 y.o. male s/p L2-5 PLIF, progressing well  PLAN: - Likely transfer to rehab unit at [facility] - Dulcolax suppository today - Cont to mobilize

## 2017-02-21 NOTE — Progress Notes (Signed)
Pt placed on CPAP 8 with nasal pillows.  Pt tolerating well at this time.  RT will continue to monitor.

## 2017-02-21 NOTE — Progress Notes (Signed)
CSW completed workup on patient. Patient is from [facility] independent living facility (ILF) with spouse. PT recommends SNF for patient. Patient and spouse indicated they prefer SNF care at [facility]. CSW confirmed with [place] that SNF bed is available and they will accept SNF referral for patient. Per MD note, patient will discharge tomorrow, 02/22/17. CSW will continue to follow and support with discharge plan.  Estanislado Emms, [place]

## 2017-02-21 NOTE — NC FL2 (Signed)
[place] LEVEL OF CARE SCREENING TOOL     IDENTIFICATION  Patient Name: [name] Birthdate: 04/17/1941 Sex: male Admission Date (Current Location): 02/17/2017  [facility] and [place] Number:  Herbalist and Address:  [facility] [place]. [facility], [place] [address], [place], [place] [address]      Provider Number: [id number]  Attending Physician Name and Address:  Newman Pies, MD  Relative Name and Phone Number:  [name], spouse, [phone]    Current Level of Care: Hospital Recommended Level of Care: [place] Prior Approval Number:    Date Approved/Denied:   PASRR Number: [id number]  Discharge Plan: SNF    Current Diagnoses: Patient Active Problem List   Diagnosis Date Noted  . Surgery, elective   . Hypoxia   . Lumbar degenerative disc disease 02/17/2017  . Cigarette smoker 11/17/2016  . 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 ALLERGIC RHINITIS 03/23/2008  . HYPERLIPIDEMIA 03/15/2008  . 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 PNEUMONIA 03/14/2008  . COPD (chronic obstructive pulmonary disease) ([place]) 03/14/2008  . ARTHRITIS, RHEUMATOID 03/14/2008  . OSA on CPAP 03/14/2008    Orientation RESPIRATION BLADDER Height & Weight     Self, Time, Situation, Place  Normal Continent Weight: 240 lb (108.9 kg) Height:  _0  (170.2 cm)  BEHAVIORAL SYMPTOMS/MOOD NEUROLOGICAL BOWEL NUTRITION STATUS      Continent Diet (heart healthy/carb modified; see DC summary)  AMBULATORY STATUS COMMUNICATION OF NEEDS Skin   Limited Assist Verbally Surgical wounds (back)                       Personal Care Assistance Level of Assistance  Bathing, Feeding Bathing Assistance: Limited assistance Feeding assistance: Independent Dressing Assistance: Limited assistance     Functional Limitations Info  Sight, Hearing, Speech Sight Info: Impaired Hearing Info: Adequate Speech Info: Adequate    SPECIAL CARE FACTORS  FREQUENCY  PT (By licensed PT)     PT Frequency: 6x/week              Contractures Contractures Info: Not present    Additional Factors Info  Code Status, Allergies, Psychotropic Code Status Info: Full Allergies Info: sulfa antibiotics, varenicline Psychotropic Info: cymbalta, trazadone         Current Medications (02/21/2017):  This is the current hospital active medication list Current Facility-Administered Medications  Medication Dose Route Frequency Provider Last Rate Last Dose  . 0.9 %  sodium chloride infusion  250 mL Intravenous Continuous Newman Pies, MD 1 mL/hr at 02/17/17 1858 250 mL at 02/17/17 1858  . acetaminophen (TYLENOL) tablet 650 mg  650 mg Oral Q4H PRN Newman Pies, MD   650 mg at 02/21/17 8366   Or  . acetaminophen (TYLENOL) suppository 650 mg  650 mg Rectal Q4H PRN Newman Pies, MD      . albuterol (PROVENTIL) (2.5 MG/3ML) 0.083% nebulizer solution 2.5 mg  2.5 mg Inhalation Q6H PRN Newman Pies, MD      . bisacodyl (DULCOLAX) suppository 10 mg  10 mg Rectal Daily PRN Newman Pies, MD   10 mg at 02/21/17 1046  . cyclobenzaprine (FLEXERIL) tablet 10 mg  10 mg Oral TID PRN Newman Pies, MD   10 mg at 02/20/17 1720  . docusate sodium (COLACE) capsule 100 mg  100 mg Oral BID Newman Pies, MD   100 mg at 02/21/17 0946  . DULoxetine (CYMBALTA) DR capsule 60 mg  60 mg Oral Daily Newman Pies, MD  60 mg at 02/21/17 0946   And  . DULoxetine (CYMBALTA) DR capsule 30 mg  30 mg Oral QHS Newman Pies, MD   30 mg at 02/20/17 2244  . finasteride (PROSCAR) tablet 5 mg  5 mg Oral Daily Newman Pies, MD   5 mg at 02/21/17 0946  . fluticasone (FLONASE) 50 MCG/ACT nasal spray 2 spray  2 spray Each Nare Daily Newman Pies, MD   2 spray at 02/21/17 [phone]  . fluticasone furoate-vilanterol (BREO ELLIPTA) 100-25 MCG/INH 1 puff  1 puff Inhalation Daily Newman Pies, MD   1 puff at 02/21/17 [phone]  . folic acid (FOLVITE) tablet 1 mg  1 mg  Oral Daily Newman Pies, MD   1 mg at 02/21/17 0946  . gabapentin (NEURONTIN) capsule 600 mg  600 mg Oral TID Newman Pies, MD   600 mg at 02/21/17 0946  . lisinopril (PRINIVIL,ZESTRIL) tablet 20 mg  20 mg Oral Daily Newman Pies, MD   20 mg at 02/21/17 0946  . MEDLINE mouth rinse  15 mL Mouth Rinse BID Newman Pies, MD   15 mL at 02/21/17 0948  . menthol-cetylpyridinium (CEPACOL) lozenge 3 mg  1 lozenge Oral PRN Newman Pies, MD       Or  . phenol [facility]) mouth spray 1 spray  1 spray Mouth/Throat PRN Newman Pies, MD      . metoprolol succinate (TOPROL-XL) 24 hr tablet 50 mg  50 mg Oral QPM Newman Pies, MD   50 mg at 02/20/17 1720  . morphine 4 MG/ML injection 4 mg  4 mg Intravenous Q2H PRN Newman Pies, MD   4 mg at 02/20/17 2683  . ondansetron (ZOFRAN) tablet 4 mg  4 mg Oral Q6H PRN Newman Pies, MD       Or  . ondansetron [facility]) injection 4 mg  4 mg Intravenous Q6H PRN Newman Pies, MD      . oxyCODONE (Oxy IR/ROXICODONE) immediate release tablet 5-10 mg  5-10 mg Oral Q3H PRN Newman Pies, MD   10 mg at 02/21/17 4196  . pantoprazole (PROTONIX) injection 40 mg  40 mg Intravenous Q12H Raylene Miyamoto, MD   40 mg at 02/21/17 0945  . prednisoLONE tablet 5 mg  5 mg Oral Daily Newman Pies, MD   5 mg at 02/21/17 0946  . predniSONE (DELTASONE) tablet 5 mg  5 mg Oral Q breakfast Newman Pies, MD   5 mg at 02/21/17 0743  . simvastatin (ZOCOR) tablet 40 mg  40 mg Oral q1800 Newman Pies, MD   40 mg at 02/20/17 1720  . sodium chloride (OCEAN) 0.65 % nasal spray 1 spray  1 spray Each Nare PRN Newman Pies, MD      . sodium chloride flush (NS) 0.9 % injection 3 mL  3 mL Intravenous Q12H Newman Pies, MD   3 mL at 02/21/17 0947  . sodium chloride flush (NS) 0.9 % injection 3 mL  3 mL Intravenous PRN Newman Pies, MD      . tamsulosin [facility]) capsule 0.4 mg  0.4 mg Oral Daily Newman Pies, MD   0.4 mg at 02/21/17 0946  .  tiotropium (SPIRIVA) inhalation capsule 18 mcg  18 mcg Inhalation Daily Newman Pies, MD   18 mcg at 02/21/17 [phone]  . traMADol (ULTRAM) tablet 100 mg  100 mg Oral Daily PRN Newman Pies, MD   100 mg at 02/19/17 0543  . traZODone (DESYREL) tablet 50 mg  50 mg Oral QHS Newman Pies, MD  50 mg at 02/20/17 2244     Discharge Medications: Please see discharge summary for a list of discharge medications.  Relevant Imaging Results:  Relevant Lab Results:   Additional Information SSN: [id number]  Estanislado Emms, LCSW

## 2017-02-21 NOTE — Evaluation (Signed)
Occupational Therapy Evaluation Patient Details Name: [name] MRN: [id number] DOB: [birth year] Today's Date: 02/21/2017    History of Present Illness 76 year old male with PMH as below admitted and underwent surgery on 5/24 for bilateral L2-3, L3-4, and L4-5 laminotomy/ foraminotomies after failed outpatient medical management for chronic back and leg pain with DDD, spinal stenosis, lumbar scoliosis, lumbago, and lumbar radiculopathy.  He recently known to our practice, followed by Dr. Lake Bells, as of 10/2016 after moving from [place] in January 2018.  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  He reports he is compliant with his CPAP of 8 with no O2 requirements at night for the last 10-12 years, but has not needed any titrations.  Last sleep study greater than 10 years ago. Overnight, he developed some hypoxia, documented around 80-82%, and possible increased work of breathing.  He was placed on BiPAP and transferred to SDU   Since, respiratory status has improved and patient now on nasal cannula.     Clinical Impression   Pt admitted with above. He demonstrates the below listed deficits and will benefit from continued OT to maximize safety and independence with BADLs.  Pt requires min a for UB ADLs and max A, overall, for LB ADLs and min guard assist for functional mobility.  He will benefit from a short SNF stay to allow him to maximize safety and independence with ADLs to allow him to transition home at mod I to supervision level.  Will follow.      Follow Up Recommendations  SNF    Equipment Recommendations  None recommended by OT    Recommendations for Other Services       Precautions / Restrictions Precautions Precautions: Fall;Back Precaution Booklet Issued: Yes (comment) Precaution  Comments: Pt able to recall 2/3 back precautions  Required Braces or Orthoses: Spinal Brace Spinal Brace: Lumbar corset;Applied in standing position      Mobility Bed Mobility Overal bed mobility: Needs Assistance Bed Mobility: Rolling;Sidelying to Sit Rolling: Supervision            Transfers Overall transfer level: Needs assistance Equipment used: Rolling walker (2 wheeled) Transfers: Sit to/from [facility] Sit to Stand: Min guard Stand pivot transfers: Min guard       General transfer comment: requires cues for hand placement     Balance Overall balance assessment: Needs assistance Sitting-balance support: No upper extremity supported;Feet supported Sitting balance-Leahy Scale: Good     Standing balance support: No upper extremity supported Standing balance-Leahy Scale: Fair Standing balance comment: static standing with min guard assist                            ADL either performed or assessed with clinical judgement   ADL Overall ADL's : Needs assistance/impaired Eating/Feeding: Independent   Grooming: Wash/dry hands;Wash/dry face;Oral care;Brushing hair;Min guard;Standing   Upper Body Bathing: Minimal assistance;Sitting   Lower Body Bathing: Maximal assistance;Sit to/from stand   Upper Body Dressing : Minimal assistance;Sitting   Lower Body Dressing: Total assistance;Sit to/from stand Lower Body Dressing Details (indicate cue type and reason): pt unable to cross ankles over knees Toilet Transfer: Min guard;Stand-pivot;Ambulation;RW;BSC   Toileting- Clothing Manipulation and Hygiene: Moderate assistance;Sit to/from stand       Functional mobility during  ADLs: Surveyor, minerals     Praxis      Pertinent Vitals/Pain Pain Assessment: 0-10 Pain Score: 5  Pain Location: lt hip and back Pain Descriptors / Indicators: Aching;Grimacing;Guarding;Operative site guarding Pain  Intervention(s): Monitored during session;Repositioned     Hand Dominance     Extremity/Trunk Assessment Upper Extremity Assessment Upper Extremity Assessment: Overall WFL for tasks assessed   Lower Extremity Assessment Lower Extremity Assessment: Defer to PT evaluation   Cervical / Trunk Assessment Cervical / Trunk Assessment: Normal   Communication Communication Communication: No difficulties   Cognition Arousal/Alertness: Awake/alert Behavior During Therapy: WFL for tasks assessed/performed Overall Cognitive Status: Within Functional Limits for tasks assessed                                     General Comments  HR sustained low 140s with activity - RN notified     Exercises     Shoulder Instructions      Home Living Family/patient expects to be discharged to:: Skilled nursing facility Living Arrangements: Spouse/significant other Available Help at Discharge: Family;Available 24 hours/day Type of Home: Independent living facility Home Access: Level entry     Home Layout: One level     Bathroom Shower/Tub: Occupational psychologist: Handicapped height Bathroom Accessibility: Yes   Home Equipment: [place] - single point;Walker - 2 wheels;Grab bars - tub/shower;Shower seat;Adaptive equipment Adaptive Equipment: Reacher;Long-handled shoe horn;Sock aid Additional Comments: Pt lives at [facility]       Prior Functioning/Environment Level of Independence: Independent;Needs assistance    ADL's / Homemaking Assistance Needed: wife occasionally assisted with socks             OT Problem List: Decreased activity tolerance;Impaired balance (sitting and/or standing);Decreased safety awareness;Decreased knowledge of use of DME or AE;Decreased knowledge of precautions;Cardiopulmonary status limiting activity;Obesity;Pain      OT Treatment/Interventions: Self-care/ADL training;DME and/or AE instruction;Therapeutic activities;Patient/family  education;Balance training    OT Goals(Current goals can be found in the care plan section) Acute Rehab OT Goals Patient Stated Goal: to go to rehab and regain independence  OT Goal Formulation: With patient Time For Goal Achievement: 02/28/17 Potential to Achieve Goals: Good ADL Goals Pt Will Perform Grooming: with supervision;standing Pt Will Perform Upper Body Bathing: with set-up;with min guard assist;sitting Pt Will Perform Lower Body Bathing: with supervision;sit to/from stand;with adaptive equipment Pt Will Perform Upper Body Dressing: with supervision;sitting Pt Will Perform Lower Body Dressing: with supervision;with adaptive equipment;sit to/from stand Pt Will Transfer to Toilet: with supervision;ambulating;regular height toilet;bedside commode;grab bars Pt Will Perform Toileting - Clothing Manipulation and hygiene: with supervision;sit to/from stand  OT Frequency: Min 2X/week   Barriers to D/C:            Co-evaluation              AM-PAC PT "6 Clicks" Daily Activity     Outcome Measure Help from another person eating meals?: None Help from another person taking care of personal grooming?: A Little Help from another person toileting, which includes using toliet, bedpan, or urinal?: A Lot Help from another person bathing (including washing, rinsing, drying)?: A Lot Help from another person to put on and taking off regular upper body clothing?: A Little Help from another person to put on and taking off regular lower body clothing?:  A Lot 6 Click Score: 16   End of Session Equipment Utilized During Treatment: Back brace Nurse Communication: Mobility status;Other (comment) (HR)  Activity Tolerance: Patient tolerated treatment well Patient left: in chair;with call bell/phone within reach;with family/visitor present  OT Visit Diagnosis: Pain;Muscle weakness (generalized) (M62.81) Pain - Right/Left: Left Pain - part of body: Leg (back)                Time:  6063-0160 OT Time Calculation (min): 24 min Charges:  OT General Charges $OT Visit: 1 Procedure OT Evaluation $OT Eval Moderate Complexity: 1 Procedure OT Treatments $Self Care/Home Management : 8-22 mins G-Codes:     [facility], OTR/L [phone]   [place], Hannan Tetzlaff M 02/21/2017, 12:02 PM

## 2017-02-22 MED ORDER — DOCUSATE SODIUM 100 MG PO CAPS: 100.0000 mg | ORAL_CAPSULE | Freq: Two times a day (BID) | ORAL | 0 refills | Status: DC

## 2017-02-22 MED ORDER — OXYCODONE HCL 5 MG PO TABS: 5.0000 mg | ORAL_TABLET | ORAL | 0 refills | Status: DC | PRN

## 2017-02-22 MED ORDER — CYCLOBENZAPRINE HCL 10 MG PO TABS: 10.0000 mg | ORAL_TABLET | Freq: Three times a day (TID) | ORAL | 1 refills | Status: DC | PRN

## 2017-02-22 NOTE — Discharge Summary (Signed)
Physician Discharge Summary  [name] MRN: [id number] DOB/AGE: 75-24-1942 75 y.o.  Admit date: 02/17/2017 Discharge date: 02/22/2017  Admission Diagnoses:L2-3, L3-4 and L4-5 degenerative disc disease, scoliosis, stenosis, lumbago, lumbar radiculopathy, neurogenic claudication  Discharge Diagnoses: The same Active Problems:   Lumbar degenerative disc disease   Surgery, elective   Hypoxia   Discharged Condition: good  Hospital Course: I performed an L2-3, L3-4 and L4-5 decompression, instrumentation, and fusion on the patient 02/17/2017.  The patient's postoperative course was remarkable for some hypoxia. He has pre-existing lung disease. The pulmonologist saw him. His hypoxia resolved.  Arrangements were made for him to go to [facility].  On 02/22/2017 the patient requested transfer to rehabilitation. The patient, and his wife, were given written and oral discharge instructions all their questions were answered.  Consults: Physical therapy, occupational therapy, pulmonology Significant Diagnostic Studies: None Treatments: L2-3, L3-4 and L4-5 decompression, instrumentation, and fusion. Discharge Exam: Blood pressure 122/77, pulse 80, temperature 97.5 F (36.4 C), temperature source Oral, resp. rate 16, height _0  (1.702 m), weight 108.9 kg (240 lb), SpO2 94 %. The patient is alert and pleasant. He looks well. His wound is healing well without drainage. His strength is grossly normal in his lower extremities.  Disposition: [facility]  Discharge Instructions    Call MD for:  difficulty breathing, headache or visual disturbances    Complete by:  As directed    Call MD for:  extreme fatigue    Complete by:  As directed    Call MD for:  hives    Complete by:  As directed    Call MD for:  persistant dizziness or light-headedness    Complete by:  As directed    Call MD for:  persistant nausea and vomiting    Complete by:  As  directed    Call MD for:  redness, tenderness, or signs of infection (pain, swelling, redness, odor or green/yellow discharge around incision site)    Complete by:  As directed    Call MD for:  severe uncontrolled pain    Complete by:  As directed    Call MD for:  temperature >100.4    Complete by:  As directed    Diet - low sodium heart healthy    Complete by:  As directed    Discharge instructions    Complete by:  As directed    Call [phone] for a followup appointment. Take a stool softener while you are using pain medications.   Driving Restrictions    Complete by:  As directed    Do not drive for 2 weeks.   Increase activity slowly    Complete by:  As directed    Lifting restrictions    Complete by:  As directed    Do not lift more than 5 pounds. No excessive bending or twisting.   May shower / Bathe    Complete by:  As directed    He may shower after the pain she is removed 3 days after surgery. Leave the incision alone.   No dressing needed    Complete by:  As directed      Allergies as of 02/22/2017      Reactions   Sulfa Antibiotics Shortness Of Breath   Varenicline Other (See Comments)   Strange thoughts      Medication List    STOP taking these medications   ibuprofen 400 MG tablet Commonly known as:  ADVIL,MOTRIN  predniSONE 5 MG tablet Commonly known as:  DELTASONE     TAKE these medications   albuterol 108 (90 Base) MCG/ACT inhaler Commonly known as:  PROVENTIL HFA;VENTOLIN HFA Inhale 2 puffs into the lungs every 6 (six) hours as needed for wheezing or shortness of breath.   cyclobenzaprine 10 MG tablet Commonly known as:  FLEXERIL Take 1 tablet (10 mg total) by mouth 3 (three) times daily as needed for muscle spasms.   docusate sodium 100 MG capsule Commonly known as:  COLACE Take 1 capsule (100 mg total) by mouth 2 (two) times daily.   DULoxetine 30 MG capsule Commonly known as:  CYMBALTA Take 30 mg by mouth every evening. Takes 60 mg in  the morning and 30 mg in the evening   DULoxetine 60 MG capsule Commonly known as:  CYMBALTA Take 60 mg by mouth daily. Takes 60 mg in the morning and 30 mg in the evening   finasteride 5 MG tablet Commonly known as:  PROSCAR Take 5 mg by mouth daily.   fluticasone 50 MCG/ACT nasal spray Commonly known as:  FLONASE Place 2 sprays into both nostrils daily.   fluticasone furoate-vilanterol 100-25 MCG/INH Aepb Commonly known as:  BREO ELLIPTA Inhale 1 puff into the lungs daily.   folic acid 1 MG tablet Commonly known as:  FOLVITE Take 1 mg by mouth daily.   gabapentin 300 MG capsule Commonly known as:  NEURONTIN Take 600 mg by mouth 3 (three) times daily.   hydroxychloroquine 200 MG tablet Commonly known as:  PLAQUENIL Take 200 mg by mouth 2 (two) times daily.   lisinopril 20 MG tablet Commonly known as:  PRINIVIL,ZESTRIL Take 20 mg by mouth daily.   methotrexate 2.5 MG tablet Take 25 mg by mouth once a week. Saturday   metoprolol succinate 50 MG 24 hr tablet Commonly known as:  TOPROL-XL Take 50 mg by mouth every evening. Take with or immediately following a meal.   omeprazole 40 MG capsule Commonly known as:  PRILOSEC Take 40 mg by mouth 2 (two) times daily as needed (takes once daily and a 2nd dose if needed for acid reflux).   oxyCODONE 5 MG immediate release tablet Commonly known as:  Oxy IR/ROXICODONE Take 1-2 tablets (5-10 mg total) by mouth every 4 (four) hours as needed for breakthrough pain.   prednisoLONE 5 MG Tabs tablet Take 5 mg by mouth daily.   REMICADE IV Inject into the vein every 8 (eight) weeks.   simvastatin 40 MG tablet Commonly known as:  ZOCOR Take 40 mg by mouth daily at 6 PM.   sodium chloride 0.65 % Soln nasal spray Commonly known as:  OCEAN Place 1 spray into both nostrils as needed for congestion.   tamsulosin 0.4 MG Caps capsule Commonly known as:  FLOMAX Take 0.4 mg by mouth daily.   Tiotropium Bromide Monohydrate 2.5  MCG/ACT Aers Commonly known as:  SPIRIVA RESPIMAT Inhale 2 puffs into the lungs daily.   traMADol 50 MG tablet Commonly known as:  ULTRAM Take 100 mg by mouth daily as needed for severe pain.   traZODone 50 MG tablet Commonly known as:  DESYREL Take 50 mg by mouth at bedtime.        SignedOphelia Charter 02/22/2017, 7:40 AM

## 2017-02-22 NOTE — Progress Notes (Signed)
Patient will discharge to [facility] Anticipated discharge date: 5/29 Family notified: wife at bedside Transportation by wife  CSW signing off.  Jorge Ny, LCSW Clinical Social Worker [phone]

## 2017-02-22 NOTE — Care Management Important Message (Signed)
Important Message  Patient Details  Name: [name] MRN: [id number] Date of Birth: [birth year]   Medicare Important Message Given:  Yes    Nathen May 02/22/2017, 1:19 PM

## 2017-02-23 NOTE — Care Management Note (Signed)
Case Management Note  Patient Details  Name: [name] MRN: [id number] Date of Birth: 02-19-1941  Subjective/Objective:    DC to [facility], CSW facilitated.               Action/Plan:   Expected Discharge Date:  02/22/17               Expected Discharge Plan:  Skilled Nursing Facility  In-House Referral:  Clinical Social Work  Discharge planning Services  CM Consult  Post Acute Care Choice:    Choice offered to:     DME Arranged:    DME Agency:     HH Arranged:    [place] Agency:     Status of Service:  Completed, signed off  If discussed at [employer] [facility] [employer], dates discussed:    Additional Comments:  Zenon Mayo, RN 02/23/2017, 10:38 PM

## 2017-03-07 NOTE — Anesthesia Postprocedure Evaluation (Signed)
Anesthesia Post Note  Patient: [name]  Procedure(s) Performed: Procedure(s) (LRB): POSTERIOR LUMBAR INTERBODY FUSION, INTERBODY PROSTHESIS, POSTERIOR LATERAL ARTHRODESIS, POSTERIOR SEGMENTAL INSTRUMENTATION LUMBAR TWO- LUMBAR THREE, LUMBAR THREE- LUMBAR FOUR, LUMBAR FOUR- LUMBAR FIVE (N/A)     Patient location during evaluation: PACU Anesthesia Type: General Level of consciousness: awake and alert Pain management: pain level controlled Vital Signs Assessment: post-procedure vital signs reviewed and stable Respiratory status: spontaneous breathing, nonlabored ventilation, respiratory function stable and patient connected to nasal cannula oxygen Cardiovascular status: blood pressure returned to baseline and stable Postop Assessment: no signs of nausea or vomiting Anesthetic complications: no    Last Vitals:  Vitals:   02/22/17 0753 02/22/17 0800  BP: 138/81 132/81  Pulse: 88 87  Resp: (!) 27 20  Temp:      Last Pain:  Vitals:   02/22/17 1052  TempSrc:   PainSc: 4    Pain Goal: Patients Stated Pain Goal: 7 (02/22/17 0515)               [name]

## 2017-04-15 ENCOUNTER — Other Ambulatory Visit: Payer: Self-pay | Admitting: Neurosurgery

## 2017-04-15 ENCOUNTER — Encounter (HOSPITAL_COMMUNITY): Payer: Self-pay | Admitting: *Deleted

## 2017-04-15 NOTE — Progress Notes (Signed)
.  "  [place] denies chest pain or shortness of breath at rest.  Patient has a history of COPD and sleep Apnea.  Patient reports that post anesthesia in May that he had a respiratory problem, but he is not sure what the problem was.  "My wife said, 'you don't want to know,' all I know is that I was in ICU."  I see in notes that oxygen saturations dropped to low 80's at times. Patient will bring CPAP mask.  Patient is an add on case, patient was told that surgery will; be around 6 pm.  "Dr [name] said I can eat that morning, I don't eat in the mornings anyway, I may just drink something."  Patient was not sure what time he said to stop eating or drinking, he is going to ask his wife and if she didn't hear patient will call the office.

## 2017-04-15 NOTE — Progress Notes (Signed)
Anesthesia Chart Review:  Pt is a same day work up.   Pt is a 76 year old male scheduled for repair of lumbar pseudomeningocele on 04/18/2017 with Newman Pies, MD  - PCP is Merilynn Finland, MD - Pulmonologist is Simonne Maffucci, MD  PMH includes:  HTN, hyperlipidemia, OSA, COPD, RA, prostate cancer, GERD. Former smoker (quit 09/27/14 but uses e-cigarettes). S/p PLIF 02/17/17. S/p splenectomy 1955.   Anesthesia history: Post-op on the evening of PLIF 02/17/17, pt was on CPAP and developed hypoxia with O2 stats in the low 80's.  Placed on BiPAP, stay in ICU stepdown. Thought to be due to post-op atelectasis, OSA, acute pain, COPD, possible aspiration with narcotic pain use  Medications include: Albuterol, breo ellipta, Plaquenil, Remicade, lisinopril, methotrexate, metoprolol, Prilosec, prednisone, simvastatin, Spiriva - Pt instructed to bring albuterol DOS by PAT RN  Labs will be obtained DOS  1 view CXR 02/18/17: Cardiomegaly, vascular congestion. Bibasilar atelectasis.  EKG 02/09/17: NSR. LAFB. Nonspecific T wave abnormality  Reviewed case with Dr. Smith Robert.  Pt to bring albuterol DOS.  May need breathing tx prior to surgery.  Dr. Smith Robert recommends "opioid-sparing anesthesia with multimodal therapy"   Pt will need further assessment DOS by assigned anesthesiologist DOS.  If no acute respiratory sx and labs acceptable DOS, I anticipate pt can proceed as scheduled.   Willeen Cass, FNP-BC [facility]/Anesthesiology Phone: [phone] 04/15/2017 4:19 PM

## 2017-04-18 ENCOUNTER — Inpatient Hospital Stay (HOSPITAL_COMMUNITY)
Admission: RE | Admit: 2017-04-18 | Discharge: 2017-04-19 | DRG: 027 | Disposition: A | Discharge status: Home Health | Payer: Medicare Other | Source: Ambulatory Visit | Attending: Neurosurgery | Admitting: Neurosurgery

## 2017-04-18 ENCOUNTER — Inpatient Hospital Stay (HOSPITAL_COMMUNITY): Payer: Medicare Other | Admitting: Emergency Medicine

## 2017-04-18 ENCOUNTER — Encounter (HOSPITAL_COMMUNITY)
Admission: RE | Disposition: A | Discharge status: Home Health | Payer: Self-pay | Source: Ambulatory Visit | Attending: Neurosurgery

## 2017-04-18 ENCOUNTER — Encounter (HOSPITAL_COMMUNITY): Payer: Self-pay | Admitting: Certified Registered Nurse Anesthetist

## 2017-04-18 DIAGNOSIS — Z9081 Acquired absence of spleen: Secondary | ICD-10-CM

## 2017-04-18 DIAGNOSIS — I10 Essential (primary) hypertension: Secondary | ICD-10-CM | POA: Diagnosis present

## 2017-04-18 DIAGNOSIS — G9619 Other disorders of meninges, not elsewhere classified: Secondary | ICD-10-CM | POA: Diagnosis present

## 2017-04-18 DIAGNOSIS — Z8249 Family history of ischemic heart disease and other diseases of the circulatory system: Secondary | ICD-10-CM | POA: Diagnosis not present

## 2017-04-18 DIAGNOSIS — G9782 Other postprocedural complications and disorders of nervous system: Principal | ICD-10-CM | POA: Diagnosis present

## 2017-04-18 DIAGNOSIS — Z96643 Presence of artificial hip joint, bilateral: Secondary | ICD-10-CM | POA: Diagnosis present

## 2017-04-18 DIAGNOSIS — J449 Chronic obstructive pulmonary disease, unspecified: Secondary | ICD-10-CM | POA: Diagnosis present

## 2017-04-18 DIAGNOSIS — G4730 Sleep apnea, unspecified: Secondary | ICD-10-CM | POA: Diagnosis present

## 2017-04-18 DIAGNOSIS — K219 Gastro-esophageal reflux disease without esophagitis: Secondary | ICD-10-CM | POA: Diagnosis present

## 2017-04-18 DIAGNOSIS — Z7951 Long term (current) use of inhaled steroids: Secondary | ICD-10-CM

## 2017-04-18 DIAGNOSIS — Z887 Allergy status to serum and vaccine status: Secondary | ICD-10-CM | POA: Diagnosis not present

## 2017-04-18 DIAGNOSIS — Z882 Allergy status to sulfonamides status: Secondary | ICD-10-CM | POA: Diagnosis not present

## 2017-04-18 DIAGNOSIS — Z8546 Personal history of malignant neoplasm of prostate: Secondary | ICD-10-CM

## 2017-04-18 DIAGNOSIS — Z809 Family history of malignant neoplasm, unspecified: Secondary | ICD-10-CM | POA: Diagnosis not present

## 2017-04-18 DIAGNOSIS — Z87891 Personal history of nicotine dependence: Secondary | ICD-10-CM

## 2017-04-18 DIAGNOSIS — E7800 Pure hypercholesterolemia, unspecified: Secondary | ICD-10-CM | POA: Diagnosis present

## 2017-04-18 DIAGNOSIS — Z7952 Long term (current) use of systemic steroids: Secondary | ICD-10-CM

## 2017-04-18 DIAGNOSIS — Z79899 Other long term (current) drug therapy: Secondary | ICD-10-CM

## 2017-04-18 HISTORY — DX: Pneumonia, unspecified organism: J18.9

## 2017-04-18 HISTORY — DX: Adverse effect of unspecified anesthetic, initial encounter: T41.45XA

## 2017-04-18 HISTORY — DX: Personal history of other medical treatment: Z92.89

## 2017-04-18 HISTORY — PX: LUMBAR WOUND DEBRIDEMENT: SHX1988

## 2017-04-18 LAB — BASIC METABOLIC PANEL
Anion gap: 9 (ref 5–15)
BUN: 14 mg/dL (ref 6–20)
CO2: 25 mmol/L (ref 22–32)
CREATININE: 0.88 mg/dL (ref 0.61–1.24)
Calcium: 9.2 mg/dL (ref 8.9–10.3)
Chloride: 100 mmol/L — ABNORMAL LOW (ref 101–111)
GFR calc Af Amer: >60 mL/min (ref >60)
GLUCOSE: 93 mg/dL (ref 65–99)
Potassium: 4.6 mmol/L (ref 3.5–5.1)
SODIUM: 134 mmol/L — AB (ref 135–145)

## 2017-04-18 LAB — CBC
HCT: 38.8 % — ABNORMAL LOW (ref 39.0–52.0)
Hemoglobin: 12.6 g/dL — ABNORMAL LOW (ref 13.0–17.0)
MCH: 34.2 pg — ABNORMAL HIGH (ref 26.0–34.0)
MCHC: 32.5 g/dL (ref 30.0–36.0)
MCV: 105.4 fL — ABNORMAL HIGH (ref 78.0–100.0)
PLATELETS: 391 10*3/uL (ref 150–400)
RBC: 3.68 MIL/uL — AB (ref 4.22–5.81)
RDW: 14.3 % (ref 11.5–15.5)
WBC: 12.9 10*3/uL — AB (ref 4.0–10.5)

## 2017-04-18 SURGERY — LUMBAR WOUND DEBRIDEMENT
Anesthesia: General | Site: Spine Lumbar

## 2017-04-18 MED ORDER — PREDNISONE 5 MG PO TABS
5.0000 mg | ORAL_TABLET | Freq: Every day | ORAL | Status: DC
  Administered 2017-04-19: 5 mg via ORAL
  Filled 2017-04-18: qty 1

## 2017-04-18 MED ORDER — BACITRACIN 50000 UNITS IM SOLR
INTRAMUSCULAR | Status: DC | PRN
  Administered 2017-04-18: 17:00:00

## 2017-04-18 MED ORDER — ONDANSETRON HCL 4 MG PO TABS: 4.0000 mg | ORAL_TABLET | Freq: Four times a day (QID) | ORAL | Status: DC | PRN

## 2017-04-18 MED ORDER — THROMBIN 5000 UNITS EX SOLR
CUTANEOUS | Status: AC
  Filled 2017-04-18: qty 10000

## 2017-04-18 MED ORDER — ACETAMINOPHEN 325 MG PO TABS: 650.0000 mg | ORAL_TABLET | ORAL | Status: DC | PRN

## 2017-04-18 MED ORDER — PROPOFOL 10 MG/ML IV BOLUS
INTRAVENOUS | Status: AC
  Filled 2017-04-18: qty 20

## 2017-04-18 MED ORDER — MORPHINE SULFATE (PF) 4 MG/ML IV SOLN: 4.0000 mg | INTRAVENOUS | Status: DC | PRN

## 2017-04-18 MED ORDER — HYDROMORPHONE HCL 1 MG/ML IJ SOLN
0.2500 mg | INTRAMUSCULAR | Status: DC | PRN
  Administered 2017-04-18 (×4): 0.5 mg via INTRAVENOUS

## 2017-04-18 MED ORDER — LISINOPRIL 20 MG PO TABS
20.0000 mg | ORAL_TABLET | Freq: Every day | ORAL | Status: DC
  Administered 2017-04-19: 20 mg via ORAL
  Filled 2017-04-18: qty 1

## 2017-04-18 MED ORDER — DEXAMETHASONE SODIUM PHOSPHATE 4 MG/ML IJ SOLN
INTRAMUSCULAR | Status: DC | PRN
  Administered 2017-04-18: 10 mg via INTRAVENOUS

## 2017-04-18 MED ORDER — SODIUM CHLORIDE 0.9% FLUSH
3.0000 mL | Freq: Two times a day (BID) | INTRAVENOUS | Status: DC
  Administered 2017-04-18: 10 mL via INTRAVENOUS
  Administered 2017-04-19: 3 mL via INTRAVENOUS

## 2017-04-18 MED ORDER — DOCUSATE SODIUM 100 MG PO CAPS
100.0000 mg | ORAL_CAPSULE | Freq: Two times a day (BID) | ORAL | Status: DC
  Administered 2017-04-18 – 2017-04-19 (×2): 100 mg via ORAL
  Filled 2017-04-18 (×2): qty 1

## 2017-04-18 MED ORDER — SCOPOLAMINE 1 MG/3DAYS TD PT72: 1.0000 | MEDICATED_PATCH | TRANSDERMAL | Status: DC

## 2017-04-18 MED ORDER — SODIUM CHLORIDE 0.9% FLUSH: 3.0000 mL | INTRAVENOUS | Status: DC | PRN

## 2017-04-18 MED ORDER — LIDOCAINE-EPINEPHRINE 1 %-1:100000 IJ SOLN
INTRAMUSCULAR | Status: AC
  Filled 2017-04-18: qty 1

## 2017-04-18 MED ORDER — TIOTROPIUM BROMIDE MONOHYDRATE 18 MCG IN CAPS
1.0000 | ORAL_CAPSULE | Freq: Every day | RESPIRATORY_TRACT | Status: DC
  Administered 2017-04-19: 18 ug via RESPIRATORY_TRACT
  Filled 2017-04-18: qty 5

## 2017-04-18 MED ORDER — TRAZODONE HCL 50 MG PO TABS
50.0000 mg | ORAL_TABLET | Freq: Every day | ORAL | Status: DC
  Administered 2017-04-18: 50 mg via ORAL
  Filled 2017-04-18: qty 1

## 2017-04-18 MED ORDER — BISACODYL 10 MG RE SUPP: 10.0000 mg | Freq: Every day | RECTAL | Status: DC | PRN

## 2017-04-18 MED ORDER — SALINE SPRAY 0.65 % NA SOLN
1.0000 | NASAL | Status: DC | PRN
  Filled 2017-04-18: qty 44

## 2017-04-18 MED ORDER — DULOXETINE HCL 60 MG PO CPEP: 60.0000 mg | ORAL_CAPSULE | Freq: Every evening | ORAL | Status: DC

## 2017-04-18 MED ORDER — CEFAZOLIN SODIUM-DEXTROSE 2-4 GM/100ML-% IV SOLN
INTRAVENOUS | Status: AC
  Filled 2017-04-18: qty 100

## 2017-04-18 MED ORDER — SODIUM CHLORIDE 0.9 % IV SOLN: 250.0000 mL | INTRAVENOUS | Status: DC

## 2017-04-18 MED ORDER — PHENOL 1.4 % MT LIQD: 1.0000 | OROMUCOSAL | Status: DC | PRN

## 2017-04-18 MED ORDER — GABAPENTIN 300 MG PO CAPS
600.0000 mg | ORAL_CAPSULE | Freq: Three times a day (TID) | ORAL | Status: DC
  Administered 2017-04-18 – 2017-04-19 (×2): 600 mg via ORAL
  Filled 2017-04-18 (×2): qty 2

## 2017-04-18 MED ORDER — FINASTERIDE 5 MG PO TABS: 5.0000 mg | ORAL_TABLET | Freq: Every evening | ORAL | Status: DC

## 2017-04-18 MED ORDER — METOPROLOL SUCCINATE ER 50 MG PO TB24
50.0000 mg | ORAL_TABLET | Freq: Every evening | ORAL | Status: DC
  Filled 2017-04-18 (×2): qty 1

## 2017-04-18 MED ORDER — HYDROMORPHONE HCL 1 MG/ML IJ SOLN
INTRAMUSCULAR | Status: AC
Start: 2017-04-18 — End: 2017-04-18
  Administered 2017-04-18: 0.5 mg via INTRAVENOUS
  Filled 2017-04-18: qty 1

## 2017-04-18 MED ORDER — ONDANSETRON HCL 4 MG/2ML IJ SOLN
INTRAMUSCULAR | Status: DC | PRN
  Administered 2017-04-18: 4 mg via INTRAVENOUS

## 2017-04-18 MED ORDER — SIMVASTATIN 40 MG PO TABS: 40.0000 mg | ORAL_TABLET | Freq: Every day | ORAL | Status: DC

## 2017-04-18 MED ORDER — THROMBIN 5000 UNITS EX SOLR
CUTANEOUS | Status: DC | PRN
  Administered 2017-04-18 (×2): 5000 [IU] via TOPICAL

## 2017-04-18 MED ORDER — FLUTICASONE PROPIONATE 50 MCG/ACT NA SUSP
2.0000 | Freq: Every day | NASAL | Status: DC | PRN
Start: 2017-04-18 — End: 2017-04-19
  Filled 2017-04-18: qty 16

## 2017-04-18 MED ORDER — CYCLOBENZAPRINE HCL 10 MG PO TABS
10.0000 mg | ORAL_TABLET | Freq: Three times a day (TID) | ORAL | Status: DC | PRN
  Administered 2017-04-18 – 2017-04-19 (×2): 10 mg via ORAL
  Filled 2017-04-18 (×2): qty 1

## 2017-04-18 MED ORDER — BACITRACIN ZINC 500 UNIT/GM EX OINT
TOPICAL_OINTMENT | CUTANEOUS | Status: AC
  Filled 2017-04-18: qty 28.35

## 2017-04-18 MED ORDER — ROCURONIUM BROMIDE 100 MG/10ML IV SOLN
INTRAVENOUS | Status: DC | PRN
  Administered 2017-04-18: 60 mg via INTRAVENOUS

## 2017-04-18 MED ORDER — ALBUTEROL SULFATE (2.5 MG/3ML) 0.083% IN NEBU: 2.5000 mg | INHALATION_SOLUTION | Freq: Four times a day (QID) | RESPIRATORY_TRACT | Status: DC | PRN

## 2017-04-18 MED ORDER — FENTANYL CITRATE (PF) 250 MCG/5ML IJ SOLN
INTRAMUSCULAR | Status: AC
  Filled 2017-04-18: qty 5

## 2017-04-18 MED ORDER — BACITRACIN ZINC 500 UNIT/GM EX OINT
TOPICAL_OINTMENT | CUTANEOUS | Status: DC | PRN
  Administered 2017-04-18: 1 via TOPICAL

## 2017-04-18 MED ORDER — CHLORHEXIDINE GLUCONATE CLOTH 2 % EX PADS: 6.0000 | MEDICATED_PAD | Freq: Once | CUTANEOUS | Status: DC

## 2017-04-18 MED ORDER — PROPOFOL 10 MG/ML IV BOLUS
INTRAVENOUS | Status: DC | PRN
  Administered 2017-04-18: 150 mg via INTRAVENOUS

## 2017-04-18 MED ORDER — SUGAMMADEX SODIUM 200 MG/2ML IV SOLN
INTRAVENOUS | Status: DC | PRN
  Administered 2017-04-18: 225 mg via INTRAVENOUS

## 2017-04-18 MED ORDER — POVIDONE-IODINE 10 % OINT PACKET
TOPICAL_OINTMENT | Freq: Once | CUTANEOUS | Status: DC
  Filled 2017-04-18: qty 1

## 2017-04-18 MED ORDER — METOPROLOL TARTRATE 5 MG/5ML IV SOLN
INTRAVENOUS | Status: DC | PRN
  Administered 2017-04-18: 2.5 mg via INTRAVENOUS

## 2017-04-18 MED ORDER — MENTHOL 3 MG MT LOZG: 1.0000 | LOZENGE | OROMUCOSAL | Status: DC | PRN

## 2017-04-18 MED ORDER — CEFAZOLIN SODIUM-DEXTROSE 2-4 GM/100ML-% IV SOLN
2.0000 g | INTRAVENOUS | Status: AC
  Administered 2017-04-18: 2 g via INTRAVENOUS

## 2017-04-18 MED ORDER — TAMSULOSIN HCL 0.4 MG PO CAPS
0.4000 mg | ORAL_CAPSULE | Freq: Every day | ORAL | Status: DC
  Administered 2017-04-19: 0.4 mg via ORAL
  Filled 2017-04-18: qty 1

## 2017-04-18 MED ORDER — OXYCODONE HCL 5 MG PO TABS
5.0000 mg | ORAL_TABLET | ORAL | Status: DC | PRN
  Administered 2017-04-18 – 2017-04-19 (×4): 10 mg via ORAL
  Filled 2017-04-18 (×4): qty 2

## 2017-04-18 MED ORDER — LIDOCAINE HCL (CARDIAC) 20 MG/ML IV SOLN
INTRAVENOUS | Status: DC | PRN
  Administered 2017-04-18: 60 mg via INTRAVENOUS

## 2017-04-18 MED ORDER — FLUTICASONE FUROATE-VILANTEROL 100-25 MCG/INH IN AEPB
1.0000 | INHALATION_SPRAY | Freq: Every day | RESPIRATORY_TRACT | Status: DC
  Administered 2017-04-19: 09:00:00 1 via RESPIRATORY_TRACT
  Filled 2017-04-18: qty 28

## 2017-04-18 MED ORDER — 0.9 % SODIUM CHLORIDE (POUR BTL) OPTIME
TOPICAL | Status: DC | PRN
  Administered 2017-04-18: 1000 mL

## 2017-04-18 MED ORDER — LACTATED RINGERS IV SOLN
INTRAVENOUS | Status: DC
  Administered 2017-04-18 (×2): via INTRAVENOUS

## 2017-04-18 MED ORDER — SENNOSIDES-DOCUSATE SODIUM 8.6-50 MG PO TABS
1.0000 | ORAL_TABLET | Freq: Every evening | ORAL | Status: DC
  Administered 2017-04-18: 1 via ORAL

## 2017-04-18 MED ORDER — HEMOSTATIC AGENTS (NO CHARGE) OPTIME
TOPICAL | Status: DC | PRN
  Administered 2017-04-18 (×2): 1 via TOPICAL

## 2017-04-18 MED ORDER — FENTANYL CITRATE (PF) 100 MCG/2ML IJ SOLN
INTRAMUSCULAR | Status: DC | PRN
  Administered 2017-04-18: 150 ug via INTRAVENOUS
  Administered 2017-04-18: 50 ug via INTRAVENOUS

## 2017-04-18 MED ORDER — VANCOMYCIN HCL 1000 MG IV SOLR
INTRAVENOUS | Status: DC | PRN
  Administered 2017-04-18: 1000 mg via TOPICAL

## 2017-04-18 MED ORDER — DULOXETINE HCL 20 MG PO CPEP
20.0000 mg | ORAL_CAPSULE | Freq: Every day | ORAL | Status: DC | PRN
  Filled 2017-04-18: qty 1

## 2017-04-18 MED ORDER — MUPIROCIN 2 % EX OINT
1.0000 "application " | TOPICAL_OINTMENT | Freq: Once | CUTANEOUS | Status: DC
  Filled 2017-04-18: qty 22

## 2017-04-18 MED ORDER — HYDROMORPHONE HCL 1 MG/ML IJ SOLN
INTRAMUSCULAR | Status: AC
  Administered 2017-04-18: 0.5 mg via INTRAVENOUS
  Filled 2017-04-18: qty 1

## 2017-04-18 MED ORDER — PANTOPRAZOLE SODIUM 40 MG PO TBEC
80.0000 mg | DELAYED_RELEASE_TABLET | Freq: Every day | ORAL | Status: DC
  Administered 2017-04-19: 80 mg via ORAL
  Filled 2017-04-18: qty 2

## 2017-04-18 MED ORDER — ONDANSETRON HCL 4 MG/2ML IJ SOLN: 4.0000 mg | Freq: Four times a day (QID) | INTRAMUSCULAR | Status: DC | PRN

## 2017-04-18 MED ORDER — BUPIVACAINE HCL (PF) 0.5 % IJ SOLN
INTRAMUSCULAR | Status: AC
Start: 2017-04-18 — End: 2017-04-18
  Filled 2017-04-18: qty 30

## 2017-04-18 MED ORDER — TRAMADOL HCL 50 MG PO TABS: 50.0000 mg | ORAL_TABLET | Freq: Two times a day (BID) | ORAL | Status: DC | PRN

## 2017-04-18 MED ORDER — PROMETHAZINE HCL 25 MG/ML IJ SOLN: 6.2500 mg | INTRAMUSCULAR | Status: DC | PRN

## 2017-04-18 MED ORDER — VANCOMYCIN HCL 1000 MG IV SOLR
INTRAVENOUS | Status: AC
  Filled 2017-04-18: qty 1000

## 2017-04-18 MED ORDER — ALBUTEROL SULFATE HFA 108 (90 BASE) MCG/ACT IN AERS
INHALATION_SPRAY | RESPIRATORY_TRACT | Status: DC | PRN
  Administered 2017-04-18 (×2): 2 via RESPIRATORY_TRACT

## 2017-04-18 MED ORDER — HYDROCORTISONE NA SUCCINATE PF 100 MG IJ SOLR
50.0000 mg | Freq: Three times a day (TID) | INTRAMUSCULAR | Status: DC
Start: 2017-04-18 — End: 2017-04-19
  Administered 2017-04-18 – 2017-04-19 (×3): 50 mg via INTRAVENOUS
  Filled 2017-04-18 (×3): qty 2

## 2017-04-18 MED ORDER — FOLIC ACID 1 MG PO TABS
1.0000 mg | ORAL_TABLET | Freq: Every day | ORAL | Status: DC
  Administered 2017-04-19: 1 mg via ORAL
  Filled 2017-04-18: qty 1

## 2017-04-18 MED ORDER — ACETAMINOPHEN 650 MG RE SUPP: 650.0000 mg | RECTAL | Status: DC | PRN

## 2017-04-18 MED ORDER — CEFAZOLIN SODIUM-DEXTROSE 2-4 GM/100ML-% IV SOLN
2.0000 g | Freq: Three times a day (TID) | INTRAVENOUS | Status: AC
  Administered 2017-04-18 – 2017-04-19 (×2): 2 g via INTRAVENOUS
  Filled 2017-04-18 (×2): qty 100

## 2017-04-18 SURGICAL SUPPLY — 46 items
APL SKNCLS STERI-STRIP NONHPOA (GAUZE/BANDAGES/DRESSINGS) ×1
BAG DECANTER FOR FLEXI CONT (MISCELLANEOUS) ×2 IMPLANT
BENZOIN TINCTURE PRP APPL 2/3 (GAUZE/BANDAGES/DRESSINGS) ×2 IMPLANT
BLADE CLIPPER SURG (BLADE) IMPLANT
CANISTER SUCT 3000ML PPV (MISCELLANEOUS) ×2 IMPLANT
CARTRIDGE OIL MAESTRO DRILL (MISCELLANEOUS) ×1 IMPLANT
DIFFUSER DRILL AIR PNEUMATIC (MISCELLANEOUS) ×2 IMPLANT
DRAPE LAPAROTOMY 100X72X124 (DRAPES) ×2 IMPLANT
DRAPE MICROSCOPE LEICA (MISCELLANEOUS) ×1 IMPLANT
DRAPE POUCH INSTRU U-SHP 10X18 (DRAPES) ×2 IMPLANT
DRAPE SURG 17X23 STRL (DRAPES) ×8 IMPLANT
ELECT REM PT RETURN 9FT ADLT (ELECTROSURGICAL) ×2
ELECTRODE REM PT RTRN 9FT ADLT (ELECTROSURGICAL) ×1 IMPLANT
GAUZE SPONGE 4X4 12PLY STRL (GAUZE/BANDAGES/DRESSINGS) ×2 IMPLANT
GAUZE SPONGE 4X4 12PLY STRL LF (GAUZE/BANDAGES/DRESSINGS) ×1 IMPLANT
GAUZE SPONGE 4X4 16PLY XRAY LF (GAUZE/BANDAGES/DRESSINGS) IMPLANT
GLOVE BIO SURGEON STRL SZ8 (GLOVE) ×2 IMPLANT
GLOVE BIO SURGEON STRL SZ8.5 (GLOVE) ×2 IMPLANT
GLOVE EXAM NITRILE LRG STRL (GLOVE) IMPLANT
GLOVE EXAM NITRILE XL STR (GLOVE) IMPLANT
GLOVE EXAM NITRILE XS STR PU (GLOVE) IMPLANT
GOWN STRL REUS W/ TWL LRG LVL3 (GOWN DISPOSABLE) IMPLANT
GOWN STRL REUS W/ TWL XL LVL3 (GOWN DISPOSABLE) IMPLANT
GOWN STRL REUS W/TWL LRG LVL3 (GOWN DISPOSABLE)
GOWN STRL REUS W/TWL XL LVL3 (GOWN DISPOSABLE)
KIT BASIN OR (CUSTOM PROCEDURE TRAY) ×2 IMPLANT
KIT ROOM TURNOVER OR (KITS) ×2 IMPLANT
NEEDLE HYPO 22GX1.5 SAFETY (NEEDLE) IMPLANT
NS IRRIG 1000ML POUR BTL (IV SOLUTION) ×2 IMPLANT
OIL CARTRIDGE MAESTRO DRILL (MISCELLANEOUS) ×2
PACK LAMINECTOMY NEURO (CUSTOM PROCEDURE TRAY) ×2 IMPLANT
PAD ARMBOARD 7.5X6 YLW CONV (MISCELLANEOUS) ×6 IMPLANT
RUBBERBAND STERILE (MISCELLANEOUS) ×2 IMPLANT
SEALANT ADHERUS EXTEND TIP (MISCELLANEOUS) ×1 IMPLANT
STRIP CLOSURE SKIN 1/2X4 (GAUZE/BANDAGES/DRESSINGS) ×2 IMPLANT
SUT ETHILON 2 0 PSLX (SUTURE) ×1 IMPLANT
SUT PROLENE 6 0 CC 1 (SUTURE) ×1 IMPLANT
SUT VIC AB 1 CT1 18XBRD ANBCTR (SUTURE) ×1 IMPLANT
SUT VIC AB 1 CT1 8-18 (SUTURE) ×2
SUT VIC AB 2-0 CP2 18 (SUTURE) ×2 IMPLANT
SWAB COLLECTION DEVICE MRSA (MISCELLANEOUS) IMPLANT
SWAB CULTURE ESWAB REG 1ML (MISCELLANEOUS) IMPLANT
TAPE CLOTH SURG 4X10 WHT LF (GAUZE/BANDAGES/DRESSINGS) ×1 IMPLANT
TOWEL GREEN STERILE (TOWEL DISPOSABLE) ×2 IMPLANT
TOWEL GREEN STERILE FF (TOWEL DISPOSABLE) ×2 IMPLANT
WATER STERILE IRR 1000ML POUR (IV SOLUTION) ×2 IMPLANT

## 2017-04-18 NOTE — Progress Notes (Signed)
Patient transported on BIPAP from PACU to 5C04. Once patient was in his room he was placed on BIPAP Nabria Nevin box with home nasal pillows and settings. Patient tolerating well.

## 2017-04-18 NOTE — Progress Notes (Signed)
Subjective:  The patient is somnolent but easily arousable. He is in no apparent distress.  Objective: Vital signs in last 24 hours: Temp:  [97.5 F (36.4 C)-97.9 F (36.6 C)] 97.5 F (36.4 C) (07/23 1813) Pulse Rate:  [97-101] 97 (07/23 1813) Resp:  [17-20] 17 (07/23 1813) BP: (142-156)/(78-87) 142/87 (07/23 1822) SpO2:  [89 %-97 %] 89 % (07/23 1813) Weight:  [108.9 kg (240 lb)] 108.9 kg (240 lb) (07/23 1544)  Intake/Output from previous day: No intake/output data recorded. Intake/Output this shift: Total I/O In: 800 [I.V.:800] Out: 45 [Blood:45]  Physical exam the patient is, but arousable. He is moving his lower extremities well.  Lab Results:  Recent Labs  04/18/17 1602  WBC 12.9*  HGB 12.6*  HCT 38.8*  PLT 391   BMET  Recent Labs  04/18/17 1602  NA 134*  K 4.6  CL 100*  CO2 25  GLUCOSE 93  BUN 14  CREATININE 0.88  CALCIUM 9.2    Studies/Results: No results found.  Assessment/Plan: The patient is doing well.  LOS: 0 days     Nayan Proch D 04/18/2017, 6:27 PM

## 2017-04-18 NOTE — Anesthesia Procedure Notes (Signed)
Procedure Name: Intubation Date/Time: 04/18/2017 4:37 PM Performed by: Oletta Lamas Pre-anesthesia Checklist: Patient identified, Emergency Drugs available, Suction available and Patient being monitored Patient Re-evaluated:Patient Re-evaluated prior to induction Oxygen Delivery Method: Circle System Utilized Preoxygenation: Pre-oxygenation with 100% oxygen Induction Type: IV induction Ventilation: Mask ventilation without difficulty Laryngoscope Size: Mac and 4 Grade View: Grade II Tube type: Oral Number of attempts: 1 Airway Equipment and Method: Stylet Placement Confirmation: ETT inserted through vocal cords under direct vision,  positive ETCO2 and breath sounds checked- equal and bilateral Secured at: 22 cm Tube secured with: Tape Dental Injury: Teeth and Oropharynx as per pre-operative assessment

## 2017-04-18 NOTE — Op Note (Signed)
Brief history: The patient is a 76 year old white male on whom I performed an L2-3, L3-4 and L4-5 decompression, instrumentation, and fusion about 2 months ago. The patient has had a persistent subcutaneous fluid collection worrisome for a lumbar pseudomeningocele. I discussed the various treatment options with the patient and his wife. He has decided to proceed with an exploration of his lumbar wound and possible repair of pseudomeningocele.  Preop diagnosis: Lumbar pseudomeningocele  Postop diagnosis: The same  Procedure: Exploration of lumbar wound with evacuation of pseudomeningocele  Surgeon: Dr. Earle Gell  Assistant: Dr. Granville Lewis  Anesthesia: Gen. endotracheal  Assessment blood loss: Minimal  Specimens: Wound cultures  Drains: None  Complications: None  Description of procedure: The patient was brought to the operating room by the anesthesia team. General endotracheal anesthesia was induced. The patient was turned to the prone position on the Wilson frame. His lumbosacral region was then prepared with Betadine scrub and Betadine solution. Sterile drapes were applied. I then used a scalpel to incise to the patient's relatively fresh surgical scar. This released straw-colored subcutaneous fluid. I evacuated this with suction. I inserted the cerebellar retractors. I then ligated the sutures in the fascia and expose the old hardware and the thecal sac. There was still tissue glue in the epidural space. I removed this tissue glue was suctioning irrigation. I dissected down to the left L4-5 interspace and inspected the 6-0 Prolene suture repair. I didn't see any CSF leakage. I inspected the rest of the thecal sac and again did not note any active CSF leakage despite the patient being in the prone position, with positive pressure ventilation and Valsalva maneuver. I placed Adheris over the exposed dura. I obtained hemostasis using bipolar cautery. I removed the retractors. I reapproximated  the patient's thoracic lumbar fascia with interrupted #1 Vicryl suture. I placed vancomycin powder in the subcutaneous space. I then reapproximated the subcutaneous tissue with interrupted 2-0 Vicryl suture. I reapproximated the skin was a running 2-0 nylon suture. The wound was then coated with bacitracin ointment. A sterile dressing was applied. The patient was then subsequently returned to the supine position. By report all sponge, instrument, and needle counts were correct at the end this case.

## 2017-04-18 NOTE — Anesthesia Postprocedure Evaluation (Signed)
Anesthesia Post Note  Patient: [name]  Procedure(s) Performed: Procedure(s) (LRB): REPAIR OF LUMBAR PSEUDOMENINGOCELE (N/A)     Patient location during evaluation: PACU Anesthesia Type: General Level of consciousness: sedated Pain management: pain level controlled Vital Signs Assessment: post-procedure vital signs reviewed and stable Respiratory status: spontaneous breathing and respiratory function stable Cardiovascular status: stable Anesthetic complications: no    Last Vitals:  Vitals:   04/18/17 1858 04/18/17 1915  BP: 125/76 127/73  Pulse: 75 83  Resp: 13 17  Temp:      Last Pain:  Vitals:   04/18/17 1915  TempSrc:   PainSc: [place]

## 2017-04-18 NOTE — Progress Notes (Signed)
RT called to set up BiPAP in the PACU. When I arrived, patient was waking up, stated he wears CPAP at home. I placed on 14/7 and 30% an he is tolerating well. He has his nasal pillows at bedside, and he will take these to room with him and we will deliver CPAP unit to room. RT to monitor as needed

## 2017-04-18 NOTE — Progress Notes (Signed)
Dr Linna Caprice at bedside to treat patients ongoing pain of 7/10 despite 53m IV Dilaudid. 849m IV Precedex given to patient by Dr JoLinna CapriceWill continue to monitor.

## 2017-04-18 NOTE — H&P (Signed)
Subjective: The patient is a 76 year old white male on whom I performed a 3 level lumbar fusion about 2 months ago. The patient did well except he has had a persistent subcutaneous fluid collection consistent with a pseudomeningocele. I discussed the various treatment with the patient. He has decided to proceed with surgery for repair of lumbar pseudomeningocele.   Past Medical History:  Diagnosis Date  . Cancer of prostate ([place])   . Complication of anesthesia 01/2017   was in ICU due to breathing complications- patient is not sure  . COPD (chronic obstructive pulmonary disease) ([place])   . Depression   . Dyspnea    on exertion  . GERD (gastroesophageal reflux disease)   . H/O seasonal allergies   . High cholesterol   . History of blood transfusion   . History of kidney stones   . Hypertension   . Pneumonia    2018  . Rheumatoid arteritis   . Sleep apnea     Past Surgical History:  Procedure Laterality Date  . BACK SURGERY  2010  . COLONOSCOPY W/ POLYPECTOMY    . HERNIA REPAIR     hernia and inguinal 1986  . HIP ARTHROPLASTY Left 04/2016  . PROSTATE BIOPSY    . SPLENECTOMY  1955  . TOTAL HIP ARTHROPLASTY Right     Allergies  Allergen Reactions  . Sulfa Antibiotics Shortness Of Breath  . Varenicline Other (See Comments)    Strange thoughts    Social History  Substance Use Topics  . Smoking status: Former Smoker    Packs/day: 1.50    Years: 54.00    Types: Cigarettes    Quit date: 09/27/2014  . Smokeless tobacco: Never Used     Comment: vapes  . Alcohol use 1.8 - 2.4 oz/week    1 - 2 Cans of beer, 2 Shots of liquor per week    Family History  Problem Relation Age of Onset  . Cancer Mother   . Heart disease Father    Prior to Admission medications   Medication Sig Start Date End Date Taking? Authorizing Provider  acetaminophen (TYLENOL) 500 MG tablet Take 1,000 mg by mouth every 6 (six) hours as needed.   Yes [provider]  albuterol (PROVENTIL  HFA;VENTOLIN HFA) 108 (90 Base) MCG/ACT inhaler Inhale 2 puffs into the lungs every 6 (six) hours as needed for wheezing or shortness of breath.   Yes [provider]  DULoxetine (CYMBALTA) 20 MG capsule Take 20 mg by mouth daily as needed. Will take in the mornings if needed   Yes [provider]  DULoxetine (CYMBALTA) 60 MG capsule Take 60 mg by mouth every evening.    Yes [provider]  finasteride (PROSCAR) 5 MG tablet Take 5 mg by mouth every evening.    Yes [provider]  fluticasone (FLONASE) 50 MCG/ACT nasal spray Place 2 sprays into both nostrils daily as needed for allergies.    Yes [provider]  fluticasone furoate-vilanterol (BREO ELLIPTA) 100-25 MCG/INH AEPB Inhale 1 puff into the lungs daily. 01/18/17  Yes Juanito Doom, MD  folic acid (FOLVITE) 1 MG tablet Take 1 mg by mouth daily.   Yes [provider]  gabapentin (NEURONTIN) 300 MG capsule Take 600 mg by mouth 3 (three) times daily.   Yes [provider]  hydroxychloroquine (PLAQUENIL) 200 MG tablet Take 200 mg by mouth 2 (two) times daily.    Yes [provider]  lisinopril (PRINIVIL,ZESTRIL) 20 MG tablet  Take 20 mg by mouth daily.   Yes [provider]  metoprolol succinate (TOPROL-XL) 50 MG 24 hr tablet Take 50 mg by mouth every evening. Take with or immediately following a meal.   Yes [provider]  omeprazole (PRILOSEC) 40 MG capsule Take 40 mg by mouth every evening.    Yes [provider]  predniSONE (DELTASONE) 5 MG tablet Take 5 mg by mouth daily with breakfast.   Yes [provider]  sennosides-docusate sodium (SENOKOT-S) 8.6-50 MG tablet Take 2 tablets by mouth every evening.   Yes [provider]  simvastatin (ZOCOR) 40 MG tablet Take 40 mg by mouth daily at 6 PM.   Yes [provider]  sodium chloride (OCEAN) 0.65 % SOLN nasal spray Place 1 spray into both nostrils as needed for  congestion.   Yes [provider]  tamsulosin (FLOMAX) 0.4 MG CAPS capsule Take 0.4 mg by mouth daily.   Yes [provider]  Tiotropium Bromide Monohydrate (SPIRIVA RESPIMAT) 2.5 MCG/ACT AERS Inhale 2 puffs into the lungs daily. 01/18/17  Yes Juanito Doom, MD  traMADol (ULTRAM) 50 MG tablet Take 50-100 mg by mouth every 12 (twelve) hours as needed for severe pain.    Yes [provider]  traZODone (DESYREL) 50 MG tablet Take 50 mg by mouth at bedtime.   Yes [provider]  InFLIXimab (REMICADE IV) Inject into the vein every 8 (eight) weeks.     [provider]  methotrexate 2.5 MG tablet Take 25 mg by mouth once a week. Saturday    [provider]     Review of Systems  Positive ROS: As above  All other systems have been reviewed and were otherwise negative with the exception of those mentioned in the HPI and as above.  Objective: Vital signs in last 24 hours: Temp:  [97.9 F (36.6 C)] 97.9 F (36.6 C) (07/23 1544) Pulse Rate:  [101] 101 (07/23 1544) Resp:  [20] 20 (07/23 1544) BP: (156)/(78) 156/78 (07/23 1544) SpO2:  [97 %] 97 % (07/23 1544) Weight:  [108.9 kg (240 lb)] 108.9 kg (240 lb) (07/23 1544)  General Appearance: Alert Head: Normocephalic, without obvious abnormality, atraumatic Eyes: PERRL, conjunctiva/corneas clear, EOM's intact,    Ears: Normal  Throat: Normal  Neck: Supple, Back: The patient's lumbar incision is well healed. There is a tense subcutaneous fluid collection Lungs: Clear to auscultation bilaterally, respirations unlabored Heart: Regular rate and rhythm, no murmur, rub or gallop Abdomen: Soft, non-tender Extremities: Extremities normal, atraumatic, no cyanosis or edema Skin: unremarkable  NEUROLOGIC:   Mental status: alert and oriented,Motor Exam - grossly normal Sensory Exam - grossly normal Reflexes:  Coordination - grossly normal Gait - grossly normal Balance - grossly  normal Cranial Nerves: I: smell Not tested  II: visual acuity  OS: Normal  OD: Normal   II: visual fields Full to confrontation  II: pupils Equal, round, reactive to light  III,VII: ptosis None  III,IV,VI: extraocular muscles  Full ROM  V: mastication Normal  V: facial light touch sensation  Normal  V,VII: corneal reflex  Present  VII: facial muscle function - upper  Normal  VII: facial muscle function - lower Normal  VIII: hearing Not tested  IX: soft palate elevation  Normal  IX,X: gag reflex Present  XI: trapezius strength  5/5  XI: sternocleidomastoid strength 5/5  XI: neck flexion strength  5/5  XII: tongue strength  Normal    Data Review Lab Results  Component Value Date   WBC 12.9 (H) 04/18/2017   HGB 12.6 (L) 04/18/2017   HCT 38.8 (L) 04/18/2017   MCV 105.4 (H) 04/18/2017   PLT 391 04/18/2017   Lab Results  Component Value Date   NA 136 02/18/2017   K 4.1 02/18/2017   CL 100 (L) 02/18/2017   CO2 29 02/18/2017   BUN 9 02/18/2017   CREATININE 0.93 02/18/2017   GLUCOSE 175 (H) 02/18/2017   No results found for: INR, PROTIME  Assessment/Plan: Lumbar pseudomeningocele: I have discussed the situation with the patient and his wife. We have discussed the various treatment options including surgery. I have described the surgical treatment option of a exploration of his lumbar wound and repair of lumbar pseudomeningocele with possible placement of lumbar drain. I had described the surgery to them. We have discussed the risks, benefits, alternatives, expected postoperative course, and likelihood of achieving our goals with surgery. I have answered all their questions. The patient has decided to proceed with surgery.   Karissa Meenan D 04/18/2017 4:17 PM

## 2017-04-18 NOTE — Anesthesia Preprocedure Evaluation (Addendum)
Anesthesia Evaluation  Patient identified by MRN, date of birth, ID band Patient awake    Reviewed: Allergy & Precautions, H&P , Patient's Chart, lab work & pertinent test results, reviewed documented beta blocker date and time   Airway Mallampati: II  TM Distance: >3 FB Neck ROM: full    Dental no notable dental hx. (+) Teeth Intact, Dental Advisory Given   Pulmonary shortness of breath and with exertion, sleep apnea and Continuous Positive Airway Pressure Ventilation , COPD,  COPD inhaler, former smoker,    Pulmonary exam normal breath sounds clear to auscultation       Cardiovascular hypertension, Pt. on medications and Pt. on home beta blockers  Rhythm:regular Rate:Normal     Neuro/Psych PSYCHIATRIC DISORDERS Depression    GI/Hepatic Neg liver ROS, GERD  ,  Endo/Other  Morbid obesity  Renal/GU negative Renal ROS     Musculoskeletal  (+) Arthritis , Rheumatoid disorders,    Abdominal   Peds  Hematology negative hematology ROS (+)   Anesthesia Other Findings   Reproductive/Obstetrics                             Anesthesia Physical  Anesthesia Plan  ASA: III  Anesthesia Plan: General   Post-op Pain Management:    Induction: Intravenous  PONV Risk Score and Plan: 3 and Ondansetron, Dexamethasone and Scopolamine patch - Pre-op  Airway Management Planned: Oral ETT  Additional Equipment:   Intra-op Plan:   Post-operative Plan: Extubation in OR  Informed Consent: I have reviewed the patients History and Physical, chart, labs and discussed the procedure including the risks, benefits and alternatives for the proposed anesthesia with the patient or authorized representative who has indicated his/her understanding and acceptance.   Dental Advisory Given  Plan Discussed with: CRNA and Surgeon  Anesthesia Plan Comments: ( )        Anesthesia Quick Evaluation

## 2017-04-18 NOTE — Transfer of Care (Signed)
Immediate Anesthesia Transfer of Care Note  Patient: [name]  Procedure(s) Performed: Procedure(s) with comments: REPAIR OF LUMBAR PSEUDOMENINGOCELE (N/A) - REAIR OF LUMBAR PSEUDOMENINGOCELE  Patient Location: PACU  Anesthesia Type:General   Level of Consciousness: awake, oriented, drowsy and patient cooperative  Airway & Oxygen Therapy: Patient Spontanous Breathing and Patient connected to nasal cannula oxygen  Post-op Assessment: Report given to RN and Post -op Vital signs reviewed and stable  Post vital signs: Reviewed and stable  Last Vitals:  Vitals:   04/18/17 1544 04/18/17 1813  BP: (!) 156/78 (!) 142/87  Pulse: (!) 101 97  Resp: 20 17  Temp: 36.6 C     Last Pain:  Vitals:   04/18/17 1556  TempSrc:   PainSc: 2       Patients Stated Pain Goal: 4 (00/93/81 8299)  Complications: No apparent anesthesia complications   Placed on bi-pap by RT when arrived to PACU per plan discussed pre-operatively with Dr. Tobias Alexander.

## 2017-04-19 ENCOUNTER — Encounter (HOSPITAL_COMMUNITY): Payer: Self-pay | Admitting: Neurosurgery

## 2017-04-19 MED ORDER — CYCLOBENZAPRINE HCL 10 MG PO TABS: 10.0000 mg | ORAL_TABLET | Freq: Three times a day (TID) | ORAL | 0 refills | Status: DC | PRN

## 2017-04-19 MED ORDER — OXYCODONE HCL 5 MG PO TABS: 5.0000 mg | ORAL_TABLET | ORAL | 0 refills | Status: DC | PRN

## 2017-04-19 MED ORDER — CYCLOBENZAPRINE HCL 10 MG PO TABS: 10.0000 mg | ORAL_TABLET | Freq: Three times a day (TID) | ORAL | Status: DC | PRN

## 2017-04-19 MED ORDER — DOCUSATE SODIUM 100 MG PO CAPS: 100.0000 mg | ORAL_CAPSULE | Freq: Two times a day (BID) | ORAL | 0 refills | Status: DC

## 2017-04-19 NOTE — Discharge Summary (Signed)
Physician Discharge Summary  [name] MRN: [id number] DOB/AGE: September 23, 1941 76 y.o.  Admit date: 04/18/2017 Discharge date: 04/19/2017  Admission Diagnoses:Lumbar pseudomeningocele  Discharge Diagnoses: The same Active Problems:   Postprocedural pseudomeningocele   Discharged Condition: good  Hospital Course: I performed an exploration of the patient's lumbar wound with evacuation of a pseudomeningocele on 04/18/2017. At surgery I did not see a CSF leak.  The patient's postoperative course is unremarkable. He is going to be mobilized with physical therapy and may go home later on today. I gave the patient and his wife written and oral discharge instructions. I have answered all their questions. See him back in about 10 days to take his sutures out  Consults: Physical therapy, respiratory therapy Significant Diagnostic Studies: None Treatments: Evacuation of lumbar pseudomeningocele Discharge Exam: Blood pressure 140/66, pulse (!) 106, temperature 97.7 F (36.5 C), temperature source Oral, resp. rate 20, height _0  (1.727 m), weight 110.8 kg (244 lb 4.8 oz), SpO2 92 %. The patient is alert and pleasant. His strength is grossly normal his lower extremities. His dressing is clean and dry. He is not having headaches.  Disposition: Home   Allergies as of 04/19/2017      Reactions   Sulfa Antibiotics Shortness Of Breath   Varenicline Other (See Comments)   Strange thoughts      Medication List    STOP taking these medications   acetaminophen 500 MG tablet Commonly known as:  TYLENOL     TAKE these medications   albuterol 108 (90 Base) MCG/ACT inhaler Commonly known as:  PROVENTIL HFA;VENTOLIN HFA Inhale 2 puffs into the lungs every 6 (six) hours as needed for wheezing or shortness of breath.   cyclobenzaprine 10 MG tablet Commonly known as:  FLEXERIL Take 1 tablet (10 mg total) by mouth 3 (three) times daily as needed for muscle spasms.   docusate  sodium 100 MG capsule Commonly known as:  COLACE Take 1 capsule (100 mg total) by mouth 2 (two) times daily.   DULoxetine 20 MG capsule Commonly known as:  CYMBALTA Take 20 mg by mouth daily as needed. Will take in the mornings if needed   DULoxetine 60 MG capsule Commonly known as:  CYMBALTA Take 60 mg by mouth every evening.   finasteride 5 MG tablet Commonly known as:  PROSCAR Take 5 mg by mouth every evening.   fluticasone 50 MCG/ACT nasal spray Commonly known as:  FLONASE Place 2 sprays into both nostrils daily as needed for allergies.   fluticasone furoate-vilanterol 100-25 MCG/INH Aepb Commonly known as:  BREO ELLIPTA Inhale 1 puff into the lungs daily.   folic acid 1 MG tablet Commonly known as:  FOLVITE Take 1 mg by mouth daily.   gabapentin 300 MG capsule Commonly known as:  NEURONTIN Take 600 mg by mouth 3 (three) times daily.   hydroxychloroquine 200 MG tablet Commonly known as:  PLAQUENIL Take 200 mg by mouth 2 (two) times daily.   lisinopril 20 MG tablet Commonly known as:  PRINIVIL,ZESTRIL Take 20 mg by mouth daily.   methotrexate 2.5 MG tablet Take 25 mg by mouth once a week. Saturday   metoprolol succinate 50 MG 24 hr tablet Commonly known as:  TOPROL-XL Take 50 mg by mouth every evening. Take with or immediately following a meal.   omeprazole 40 MG capsule Commonly known as:  PRILOSEC Take 40 mg by mouth every evening.   oxyCODONE 5 MG immediate release tablet Commonly known as:  Oxy IR/ROXICODONE Take 1-2 tablets (5-10 mg total) by mouth every 3 (three) hours as needed for breakthrough pain.   predniSONE 5 MG tablet Commonly known as:  DELTASONE Take 5 mg by mouth daily with breakfast.   REMICADE IV Inject into the vein every 8 (eight) weeks.   sennosides-docusate sodium 8.6-50 MG tablet Commonly known as:  SENOKOT-S Take 2 tablets by mouth every evening.   simvastatin 40 MG tablet Commonly known as:  ZOCOR Take 40 mg by mouth  daily at 6 PM.   sodium chloride 0.65 % Soln nasal spray Commonly known as:  OCEAN Place 1 spray into both nostrils as needed for congestion.   tamsulosin 0.4 MG Caps capsule Commonly known as:  FLOMAX Take 0.4 mg by mouth daily.   Tiotropium Bromide Monohydrate 2.5 MCG/ACT Aers Commonly known as:  SPIRIVA RESPIMAT Inhale 2 puffs into the lungs daily.   traMADol 50 MG tablet Commonly known as:  ULTRAM Take 50-100 mg by mouth every 12 (twelve) hours as needed for severe pain.   traZODone 50 MG tablet Commonly known as:  DESYREL Take 50 mg by mouth at bedtime.        SignedOphelia Charter 04/19/2017, 9:23 AM

## 2017-04-19 NOTE — Care Management Note (Signed)
Case Management Note  Patient Details  Name: GRAIDEN HENES MRN: [id number] Date of Birth: [birth year]  Subjective/Objective:                    Action/Plan: Pt discharging home with orders for [facility] services. CM met with the patient and he resides at [facility]. He would like to use the therapy services through [facility]. CM called and spoke to [facility] in the therapy department and faxed her the information and orders per request [phone]). Wife to provide transportation home.   Expected Discharge Date:                  Expected Discharge Plan:  [place]  In-House Referral:     Discharge planning Services  CM Consult  Post Acute Care Choice:  Home Health Choice offered to:  Patient  DME Arranged:    DME Agency:     HH Arranged:  PT HH Agency:   (PT through [facility])  Status of Service:  Completed, signed off  If discussed at [place] of Stay Meetings, dates discussed:    Additional Comments:  Pollie Friar, RN 04/19/2017, 12:43 PM

## 2017-04-19 NOTE — Progress Notes (Signed)
PT Cancellation Note  Patient Details Name: AKI ABALOS MRN: [id number] DOB: 06-18-1941   Cancelled Treatment:    Reason Eval/Treat Not Completed: Medical issues which prohibited therapy Pt currently with strict bed rest orders. Will follow up once bedrest orders removed.   Leighton Ruff, PT, DPT  Acute Rehabilitation Services  Pager: [phone]    Rudean Hitt 04/19/2017, 10:48 AM

## 2017-04-19 NOTE — Evaluation (Signed)
Physical Therapy Evaluation Patient Details Name: [name] MRN: [id number] DOB: [birth year] Today's Date: 04/19/2017   History of Present Illness  Pt is a 76 y/o male s/p repair of lumbar pseudomeningocele formed after lumbar fusion surgery 2 months ago. PMH includes HTN, RA, sleep apnea, depression, Prostate cancer, s/p 3 level lumbar fusion, and R THA.   Clinical Impression  Pt s/p surgery above with deficits below. PTA, pt was ambulatory with cane. Upon eval, pt limited by post op pain, weakness, and mildly decreased balance. Pt requiring min guard to supervision for mobility. Demonstrated good technique and maintained precautions throughout session. Reports wife will be available to assist as needed upon d/c, and has all DME. Recommending continuation of PT at independent living. Will continue to follow acutely to maximize functional mobility independence.     Follow Up Recommendations Supervision for mobility/OOB;Outpatient PT (continuation of PT at independent living )    Equipment Recommendations  None recommended by PT    Recommendations for Other Services       Precautions / Restrictions Precautions Precautions: Back Precaution Booklet Issued: No Precaution Comments: Pt able to recall back precautions.  Restrictions Weight Bearing Restrictions: No      Mobility  Bed Mobility Overal bed mobility: Needs Assistance Bed Mobility: Rolling;Sidelying to Sit Rolling: Supervision Sidelying to sit: Supervision       General bed mobility comments: Supervision for safety. Demonstrated good log roll technique without cues.   Transfers Overall transfer level: Needs assistance Equipment used: None Transfers: Sit to/from Stand Sit to Stand: Min guard         General transfer comment: Min guard for safety.   Ambulation/Gait Ambulation/Gait assistance: Min guard;Supervision Ambulation Distance (Feet): 200 Feet Assistive device: Straight cane;1 person hand held  assist Gait Pattern/deviations: Step-through pattern;Decreased stride length Gait velocity: Decreased Gait velocity interpretation: Below normal speed for age/gender General Gait Details: Slow, guarded gait. Attempted gait with HHA, however, required min guard for steadying. Used cane for remainder of gait training, and pt requiring min guard to supervision for safety. Educated about generalized walking program to perform at home.   Stairs            Wheelchair Mobility    Modified Rankin (Stroke Patients Only)       Balance Overall balance assessment: Needs assistance Sitting-balance support: No upper extremity supported;Feet supported Sitting balance-Leahy Scale: Good     Standing balance support: No upper extremity supported;During functional activity;Single extremity supported Standing balance-Leahy Scale: Fair Standing balance comment: Able to maintain static standing without UE support.                              Pertinent Vitals/Pain Pain Assessment: 0-10 Pain Score: 3  Pain Location: back pain  Pain Descriptors / Indicators: Sore;Operative site guarding Pain Intervention(s): Limited activity within patient's tolerance;Monitored during session;Repositioned    Home Living Family/patient expects to be discharged to:: Private residence Living Arrangements: Spouse/significant other Available Help at Discharge: Family;Available 24 hours/day Type of Home: Independent living facility Home Access: Level entry     Home Layout: One level Home Equipment: Cane - single point;Walker - 2 wheels;Grab bars - tub/shower;Shower seat;Adaptive equipment      Prior Function Level of Independence: Independent;Needs assistance   Gait / Transfers Assistance Needed: Uses cane with ambulation   ADL's / Homemaking Assistance Needed: Wife needs to help with compression socks         Hand  Dominance   Dominant Hand: Right    Extremity/Trunk Assessment   Upper  Extremity Assessment Upper Extremity Assessment: Overall WFL for tasks assessed    Lower Extremity Assessment Lower Extremity Assessment: Generalized weakness (seonsory in tact. )    Cervical / Trunk Assessment Cervical / Trunk Assessment: Other exceptions Cervical / Trunk Exceptions: s/p back surgery   Communication   Communication: No difficulties  Cognition Arousal/Alertness: Awake/alert Behavior During Therapy: WFL for tasks assessed/performed Overall Cognitive Status: Within Functional Limits for tasks assessed                                        General Comments General comments (skin integrity, edema, etc.): Pt's wife present throughout session. Answered all questions. Discussed using RW for longer distances, as pt reports he sometimes feels fatigued and dizzy. Asymptomatic throughout session.     Exercises     Assessment/Plan    PT Assessment Patient needs continued PT services  PT Problem List Decreased strength;Decreased balance;Decreased mobility;Pain       PT Treatment Interventions DME instruction;Gait training;Functional mobility training;Therapeutic activities;Therapeutic exercise;Balance training;Neuromuscular re-education;Patient/family education    PT Goals (Current goals can be found in the Care Plan section)  Acute Rehab PT Goals Patient Stated Goal: to go home  PT Goal Formulation: With patient Time For Goal Achievement: 04/26/17 Potential to Achieve Goals: Good    Frequency Min 5X/week   Barriers to discharge        Co-evaluation               AM-PAC PT "6 Clicks" Daily Activity  Outcome Measure Difficulty turning over in bed (including adjusting bedclothes, sheets and blankets)?: A Little Difficulty moving from lying on back to sitting on the side of the bed? : A Little Difficulty sitting down on and standing up from a chair with arms (e.g., wheelchair, bedside commode, etc,.)?: Total Help needed moving to and from  a bed to chair (including a wheelchair)?: A Little Help needed walking in hospital room?: A Little Help needed climbing 3-5 steps with a railing? : A Little 6 Click Score: 16    End of Session Equipment Utilized During Treatment: Gait belt Activity Tolerance: Patient tolerated treatment well Patient left: in chair;with call bell/phone within reach;with family/visitor present Nurse Communication: Mobility status PT Visit Diagnosis: Pain;Other abnormalities of gait and mobility (R26.89) Pain - part of body:  (back )    Time: 3329-5188 PT Time Calculation (min) (ACUTE ONLY): 22 min   Charges:   PT Evaluation $PT Eval Low Complexity: 1 Procedure PT Treatments $Gait Training: 8-22 mins   PT G Codes:        Leighton Ruff, PT, DPT  Acute Rehabilitation Services  Pager: [phone]   Rudean Hitt 04/19/2017, 12:24 PM

## 2017-04-19 NOTE — Progress Notes (Signed)
Patient is discharged from room 5C04 at this time. Alert and in stable condition. IV site d/c'd and instructions read to patient with understanding verbalized. Left unit via wheelchair with wife and all belongings at side.

## 2017-04-20 ENCOUNTER — Telehealth: Payer: Self-pay | Admitting: Pulmonary Disease

## 2017-04-20 DIAGNOSIS — Z9989 Dependence on other enabling machines and devices: Principal | ICD-10-CM

## 2017-04-20 DIAGNOSIS — G4733 Obstructive sleep apnea (adult) (pediatric): Secondary | ICD-10-CM

## 2017-04-20 NOTE — Telephone Encounter (Signed)
Spoke with the pt to verify the msg  Order sent to [facility] for CPAP supplies  Nothing further needed per pt

## 2017-04-23 LAB — AEROBIC/ANAEROBIC CULTURE W GRAM STAIN (SURGICAL/DEEP WOUND)

## 2017-04-23 LAB — AEROBIC/ANAEROBIC CULTURE (SURGICAL/DEEP WOUND)

## 2017-05-02 ENCOUNTER — Encounter (HOSPITAL_COMMUNITY): Payer: Self-pay | Admitting: General Practice

## 2017-05-02 ENCOUNTER — Inpatient Hospital Stay (HOSPITAL_COMMUNITY)
Admission: AD | Admit: 2017-05-02 | Discharge: 2017-05-08 | DRG: 858 | Disposition: A | Discharge status: Home Health | Payer: Medicare Other | Source: Ambulatory Visit | Attending: Neurosurgery | Admitting: Neurosurgery

## 2017-05-02 DIAGNOSIS — G8929 Other chronic pain: Secondary | ICD-10-CM | POA: Diagnosis present

## 2017-05-02 DIAGNOSIS — Z881 Allergy status to other antibiotic agents status: Secondary | ICD-10-CM

## 2017-05-02 DIAGNOSIS — J449 Chronic obstructive pulmonary disease, unspecified: Secondary | ICD-10-CM | POA: Diagnosis present

## 2017-05-02 DIAGNOSIS — Z8546 Personal history of malignant neoplasm of prostate: Secondary | ICD-10-CM | POA: Diagnosis not present

## 2017-05-02 DIAGNOSIS — K5900 Constipation, unspecified: Secondary | ICD-10-CM | POA: Diagnosis present

## 2017-05-02 DIAGNOSIS — Z981 Arthrodesis status: Secondary | ICD-10-CM

## 2017-05-02 DIAGNOSIS — E7800 Pure hypercholesterolemia, unspecified: Secondary | ICD-10-CM | POA: Diagnosis present

## 2017-05-02 DIAGNOSIS — M2578 Osteophyte, vertebrae: Secondary | ICD-10-CM

## 2017-05-02 DIAGNOSIS — J302 Other seasonal allergic rhinitis: Secondary | ICD-10-CM | POA: Diagnosis present

## 2017-05-02 DIAGNOSIS — Z87891 Personal history of nicotine dependence: Secondary | ICD-10-CM

## 2017-05-02 DIAGNOSIS — Z8249 Family history of ischemic heart disease and other diseases of the circulatory system: Secondary | ICD-10-CM | POA: Diagnosis not present

## 2017-05-02 DIAGNOSIS — Z6834 Body mass index (BMI) 34.0-34.9, adult: Secondary | ICD-10-CM

## 2017-05-02 DIAGNOSIS — T148XXA Other injury of unspecified body region, initial encounter: Secondary | ICD-10-CM | POA: Diagnosis not present

## 2017-05-02 DIAGNOSIS — K219 Gastro-esophageal reflux disease without esophagitis: Secondary | ICD-10-CM | POA: Diagnosis present

## 2017-05-02 DIAGNOSIS — G9782 Other postprocedural complications and disorders of nervous system: Secondary | ICD-10-CM

## 2017-05-02 DIAGNOSIS — Z79891 Long term (current) use of opiate analgesic: Secondary | ICD-10-CM

## 2017-05-02 DIAGNOSIS — I10 Essential (primary) hypertension: Secondary | ICD-10-CM | POA: Diagnosis present

## 2017-05-02 DIAGNOSIS — M0579 Rheumatoid arthritis with rheumatoid factor of multiple sites without organ or systems involvement: Secondary | ICD-10-CM | POA: Diagnosis present

## 2017-05-02 DIAGNOSIS — Z96641 Presence of right artificial hip joint: Secondary | ICD-10-CM | POA: Diagnosis present

## 2017-05-02 DIAGNOSIS — B965 Pseudomonas (aeruginosa) (mallei) (pseudomallei) as the cause of diseases classified elsewhere: Secondary | ICD-10-CM | POA: Diagnosis present

## 2017-05-02 DIAGNOSIS — T814XXA Infection following a procedure, initial encounter: Principal | ICD-10-CM | POA: Diagnosis present

## 2017-05-02 DIAGNOSIS — Z888 Allergy status to other drugs, medicaments and biological substances status: Secondary | ICD-10-CM

## 2017-05-02 DIAGNOSIS — G4733 Obstructive sleep apnea (adult) (pediatric): Secondary | ICD-10-CM | POA: Diagnosis present

## 2017-05-02 DIAGNOSIS — Z7983 Long term (current) use of bisphosphonates: Secondary | ICD-10-CM | POA: Diagnosis not present

## 2017-05-02 DIAGNOSIS — Z9081 Acquired absence of spleen: Secondary | ICD-10-CM

## 2017-05-02 DIAGNOSIS — Z7952 Long term (current) use of systemic steroids: Secondary | ICD-10-CM

## 2017-05-02 DIAGNOSIS — M4620 Osteomyelitis of vertebra, site unspecified: Secondary | ICD-10-CM | POA: Diagnosis not present

## 2017-05-02 DIAGNOSIS — Z7951 Long term (current) use of inhaled steroids: Secondary | ICD-10-CM

## 2017-05-02 DIAGNOSIS — G4730 Sleep apnea, unspecified: Secondary | ICD-10-CM | POA: Diagnosis present

## 2017-05-02 DIAGNOSIS — G972 Intracranial hypotension following ventricular shunting: Secondary | ICD-10-CM | POA: Diagnosis not present

## 2017-05-02 DIAGNOSIS — T847XXA Infection and inflammatory reaction due to other internal orthopedic prosthetic devices, implants and grafts, initial encounter: Secondary | ICD-10-CM | POA: Diagnosis not present

## 2017-05-02 DIAGNOSIS — A498 Other bacterial infections of unspecified site: Secondary | ICD-10-CM | POA: Diagnosis not present

## 2017-05-02 DIAGNOSIS — Y838 Other surgical procedures as the cause of abnormal reaction of the patient, or of later complication, without mention of misadventure at the time of the procedure: Secondary | ICD-10-CM | POA: Diagnosis present

## 2017-05-02 DIAGNOSIS — F329 Major depressive disorder, single episode, unspecified: Secondary | ICD-10-CM | POA: Diagnosis present

## 2017-05-02 DIAGNOSIS — Z79899 Other long term (current) drug therapy: Secondary | ICD-10-CM

## 2017-05-02 DIAGNOSIS — L24A9 Irritant contact dermatitis due friction or contact with other specified body fluids: Secondary | ICD-10-CM

## 2017-05-02 DIAGNOSIS — T847XXD Infection and inflammatory reaction due to other internal orthopedic prosthetic devices, implants and grafts, subsequent encounter: Secondary | ICD-10-CM | POA: Diagnosis not present

## 2017-05-02 HISTORY — DX: Malignant neoplasm of prostate: C61

## 2017-05-02 HISTORY — DX: Dependence on other enabling machines and devices: Z99.89

## 2017-05-02 HISTORY — DX: Low back pain: M54.5

## 2017-05-02 HISTORY — DX: Obstructive sleep apnea (adult) (pediatric): G47.33

## 2017-05-02 HISTORY — DX: Low back pain, unspecified: M54.50

## 2017-05-02 HISTORY — DX: Other chronic pain: G89.29

## 2017-05-02 LAB — BASIC METABOLIC PANEL
ANION GAP: 9 (ref 5–15)
BUN: 11 mg/dL (ref 6–20)
CO2: 29 mmol/L (ref 22–32)
Calcium: 9.3 mg/dL (ref 8.9–10.3)
Chloride: 96 mmol/L — ABNORMAL LOW (ref 101–111)
Creatinine, Ser: 0.91 mg/dL (ref 0.61–1.24)
GFR calc Af Amer: >60 mL/min (ref >60)
GLUCOSE: 88 mg/dL (ref 65–99)
POTASSIUM: 3.9 mmol/L (ref 3.5–5.1)
Sodium: 134 mmol/L — ABNORMAL LOW (ref 135–145)

## 2017-05-02 LAB — C-REACTIVE PROTEIN: CRP: 9.1 mg/dL — ABNORMAL HIGH (ref <1.0)

## 2017-05-02 LAB — CBC
HEMATOCRIT: 37.2 % — AB (ref 39.0–52.0)
HEMOGLOBIN: 12.1 g/dL — AB (ref 13.0–17.0)
MCH: 34.1 pg — ABNORMAL HIGH (ref 26.0–34.0)
MCHC: 32.5 g/dL (ref 30.0–36.0)
MCV: 104.8 fL — ABNORMAL HIGH (ref 78.0–100.0)
Platelets: 402 10*3/uL — ABNORMAL HIGH (ref 150–400)
RBC: 3.55 MIL/uL — AB (ref 4.22–5.81)
RDW: 14.6 % (ref 11.5–15.5)
WBC: 14.5 10*3/uL — AB (ref 4.0–10.5)

## 2017-05-02 LAB — SEDIMENTATION RATE: Sed Rate: 113 mm/hr — ABNORMAL HIGH (ref 0–16)

## 2017-05-02 MED ORDER — DULOXETINE HCL 60 MG PO CPEP
60.0000 mg | ORAL_CAPSULE | Freq: Every evening | ORAL | Status: DC
  Administered 2017-05-02: 60 mg via ORAL
  Filled 2017-05-02: qty 1

## 2017-05-02 MED ORDER — OXYCODONE HCL 5 MG PO TABS
5.0000 mg | ORAL_TABLET | ORAL | Status: DC | PRN
  Administered 2017-05-03: 5 mg via ORAL
  Administered 2017-05-03 – 2017-05-05 (×6): 10 mg via ORAL
  Administered 2017-05-05: 5 mg via ORAL
  Filled 2017-05-02 (×5): qty 2
  Filled 2017-05-02: qty 1
  Filled 2017-05-02 (×2): qty 2
  Filled 2017-05-02: qty 1

## 2017-05-02 MED ORDER — METOPROLOL SUCCINATE ER 25 MG PO TB24
50.0000 mg | ORAL_TABLET | Freq: Every evening | ORAL | Status: DC
  Administered 2017-05-02 – 2017-05-07 (×6): 50 mg via ORAL
  Filled 2017-05-02 (×6): qty 2

## 2017-05-02 MED ORDER — SIMVASTATIN 40 MG PO TABS
40.0000 mg | ORAL_TABLET | Freq: Every day | ORAL | Status: DC
  Administered 2017-05-03 – 2017-05-07 (×5): 40 mg via ORAL
  Filled 2017-05-02 (×6): qty 1

## 2017-05-02 MED ORDER — TRAZODONE HCL 50 MG PO TABS
50.0000 mg | ORAL_TABLET | Freq: Every day | ORAL | Status: DC
  Administered 2017-05-02: 50 mg via ORAL
  Filled 2017-05-02: qty 1

## 2017-05-02 MED ORDER — PANTOPRAZOLE SODIUM 40 MG PO TBEC
80.0000 mg | DELAYED_RELEASE_TABLET | Freq: Every day | ORAL | Status: DC
  Administered 2017-05-02 – 2017-05-07 (×6): 80 mg via ORAL
  Filled 2017-05-02 (×6): qty 2

## 2017-05-02 MED ORDER — FINASTERIDE 5 MG PO TABS
5.0000 mg | ORAL_TABLET | Freq: Every evening | ORAL | Status: DC
  Administered 2017-05-02 – 2017-05-07 (×6): 5 mg via ORAL
  Filled 2017-05-02 (×6): qty 1

## 2017-05-02 MED ORDER — PREDNISONE 5 MG PO TABS
5.0000 mg | ORAL_TABLET | Freq: Every day | ORAL | Status: DC
  Administered 2017-05-03 – 2017-05-08 (×5): 5 mg via ORAL
  Filled 2017-05-02 (×5): qty 1

## 2017-05-02 MED ORDER — CYCLOBENZAPRINE HCL 10 MG PO TABS
10.0000 mg | ORAL_TABLET | Freq: Three times a day (TID) | ORAL | Status: DC | PRN
  Administered 2017-05-02 – 2017-05-04 (×2): 10 mg via ORAL
  Filled 2017-05-02 (×2): qty 1

## 2017-05-02 MED ORDER — DOCUSATE SODIUM 100 MG PO CAPS
100.0000 mg | ORAL_CAPSULE | Freq: Two times a day (BID) | ORAL | Status: DC
  Administered 2017-05-02 – 2017-05-04 (×5): 100 mg via ORAL
  Filled 2017-05-02 (×5): qty 1

## 2017-05-02 MED ORDER — LISINOPRIL 20 MG PO TABS
20.0000 mg | ORAL_TABLET | Freq: Every day | ORAL | Status: DC
  Administered 2017-05-02 – 2017-05-08 (×7): 20 mg via ORAL
  Filled 2017-05-02 (×7): qty 1

## 2017-05-02 MED ORDER — CIPROFLOXACIN HCL 500 MG PO TABS
500.0000 mg | ORAL_TABLET | Freq: Two times a day (BID) | ORAL | Status: DC
  Administered 2017-05-02 – 2017-05-03 (×2): 500 mg via ORAL
  Filled 2017-05-02 (×2): qty 1

## 2017-05-02 MED ORDER — TIOTROPIUM BROMIDE MONOHYDRATE 18 MCG IN CAPS
1.0000 | ORAL_CAPSULE | Freq: Every day | RESPIRATORY_TRACT | Status: DC
  Administered 2017-05-03 – 2017-05-08 (×5): 18 ug via RESPIRATORY_TRACT
  Filled 2017-05-02 (×2): qty 5

## 2017-05-02 MED ORDER — ALBUTEROL SULFATE (2.5 MG/3ML) 0.083% IN NEBU
2.5000 mg | INHALATION_SOLUTION | Freq: Four times a day (QID) | RESPIRATORY_TRACT | Status: DC | PRN
  Administered 2017-05-04: 2.5 mg via RESPIRATORY_TRACT
  Filled 2017-05-02: qty 3

## 2017-05-02 MED ORDER — GABAPENTIN 300 MG PO CAPS
600.0000 mg | ORAL_CAPSULE | Freq: Three times a day (TID) | ORAL | Status: DC
  Administered 2017-05-02 – 2017-05-08 (×15): 600 mg via ORAL
  Filled 2017-05-02 (×16): qty 2

## 2017-05-02 MED ORDER — FOLIC ACID 1 MG PO TABS
1.0000 mg | ORAL_TABLET | Freq: Every day | ORAL | Status: DC
  Administered 2017-05-03 – 2017-05-08 (×6): 1 mg via ORAL
  Filled 2017-05-02 (×7): qty 1

## 2017-05-02 MED ORDER — FLUTICASONE PROPIONATE 50 MCG/ACT NA SUSP
2.0000 | Freq: Every day | NASAL | Status: DC | PRN
  Administered 2017-05-05 – 2017-05-08 (×4): 2 via NASAL
  Filled 2017-05-02: qty 16

## 2017-05-02 MED ORDER — SALINE SPRAY 0.65 % NA SOLN: 1.0000 | NASAL | Status: DC | PRN

## 2017-05-02 MED ORDER — FLUTICASONE FUROATE-VILANTEROL 100-25 MCG/INH IN AEPB
1.0000 | INHALATION_SPRAY | Freq: Every day | RESPIRATORY_TRACT | Status: DC
  Administered 2017-05-03 – 2017-05-08 (×5): 1 via RESPIRATORY_TRACT
  Filled 2017-05-02 (×2): qty 28

## 2017-05-02 MED ORDER — SENNOSIDES-DOCUSATE SODIUM 8.6-50 MG PO TABS
2.0000 | ORAL_TABLET | Freq: Every evening | ORAL | Status: DC
  Administered 2017-05-02 – 2017-05-07 (×6): 2 via ORAL
  Filled 2017-05-02 (×6): qty 2

## 2017-05-02 MED ORDER — TAMSULOSIN HCL 0.4 MG PO CAPS
0.4000 mg | ORAL_CAPSULE | Freq: Every day | ORAL | Status: DC
  Administered 2017-05-02 – 2017-05-08 (×7): 0.4 mg via ORAL
  Filled 2017-05-02 (×7): qty 1

## 2017-05-02 MED ORDER — HYDROXYCHLOROQUINE SULFATE 200 MG PO TABS
200.0000 mg | ORAL_TABLET | Freq: Two times a day (BID) | ORAL | Status: DC
  Administered 2017-05-02 – 2017-05-08 (×11): 200 mg via ORAL
  Filled 2017-05-02 (×11): qty 1

## 2017-05-02 MED ORDER — DULOXETINE HCL 20 MG PO CPEP
20.0000 mg | ORAL_CAPSULE | Freq: Every day | ORAL | Status: DC
  Filled 2017-05-02: qty 1

## 2017-05-02 NOTE — H&P (Signed)
Subjective: The patient is a 76 year old white male on whom I performed a lumbar fusion on 02/17/2017.  At surgery the patient had a CSF leak which was repaired with 6 0 Prolene.  The patient had a persistent pseudomeningocele.  I performed a drainage of the pseudomeningocele on 04/18/2017 but could not find any spinal fluid leak.  The patient was due to come in tomorrow to get his sutures out.  He called this morning said he has some drainage from his wound.  I saw him in the office this afternoon and noted that he had dark watery drainage from his wound.  I placed is stable and is stop.  I recommend the patient be admitted for  Observation and further workup of a presumed persistent pseudomeningocele.  I recommend he undergo a lumbar myelogram to see if we can find a leak.   The patient admits to a mild headache.  He has had no fevers.  His back is otherwise doing well.  Past Medical History:  Diagnosis Date  . Chronic lower back pain   . Complication of anesthesia 01/2017   was in ICU due to breathing complications-  . COPD (chronic obstructive pulmonary disease) ([place])   . Depression   . Dyspnea    on exertion  . GERD (gastroesophageal reflux disease)   . H/O seasonal allergies   . High cholesterol   . 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 History of kidney stones   . Hypertension   . OSA on CPAP   . Pneumonia    "I've had it several times; last time was end of Jan 2018" (05/02/2017)  . Prostate cancer ([place])   . Rheumatoid arteritis    "all over" (05/02/2017)    Past Surgical History:  Procedure Laterality Date  . BACK SURGERY    . COLONOSCOPY W/ POLYPECTOMY    . FRACTURE SURGERY    . HERNIA REPAIR    . HIP ARTHROPLASTY Left 04/2016  . INGUINAL HERNIA REPAIR Left 1986  . JOINT REPLACEMENT    . LUMBAR WOUND DEBRIDEMENT N/A 04/18/2017   Procedure: REPAIR OF LUMBAR PSEUDOMENINGOCELE;  Surgeon: Newman Pies, MD;  Location: [facility];  Service: Neurosurgery;   Laterality: N/A;  REAIR OF LUMBAR PSEUDOMENINGOCELE  . MAXIMUM ACCESS (MAS)POSTERIOR LUMBAR INTERBODY FUSION (PLIF) 3 LEVEL  02/17/2017   [name] 02/17/2017  . POSTERIOR LAMINECTOMY / DECOMPRESSION LUMBAR SPINE  2010  . PROSTATE BIOPSY    . PROSTATE BIOPSY  <2013 X 3  . SPLENECTOMY  1955  . TONSILLECTOMY    . TOTAL HIP ARTHROPLASTY Right 10/2015  . UMBILICAL HERNIA REPAIR  05/2016    Allergies  Allergen Reactions  . Sulfa Antibiotics Shortness Of Breath  . Varenicline Other (See Comments)    Strange thoughts    Social History  Substance Use Topics  . Smoking status: Former Smoker    Packs/day: 1.50    Years: 54.00    Types: Cigarettes    Quit date: 09/27/2014  . Smokeless tobacco: Never Used     Comment: 05/02/2017 "quit vaping 09/2016"  . Alcohol use 10.2 oz/week    7 Cans of beer, 10 Shots of liquor per week    Family History  Problem Relation Age of Onset  . Cancer Mother   . Heart disease Father    Prior to Admission medications   Medication Sig Start Date End Date Taking? Authorizing Provider  albuterol (PROVENTIL HFA;VENTOLIN HFA) 108 (90 Base) MCG/ACT inhaler Inhale 2  puffs into the lungs every 6 (six) hours as needed for wheezing or shortness of breath.   Yes [provider]  ciprofloxacin (CIPRO) 500 MG tablet Take 500 mg by mouth 2 (two) times daily. For ten days 04/27/17 05/07/17 Yes [provider]  cyclobenzaprine (FLEXERIL) 10 MG tablet Take 1 tablet (10 mg total) by mouth 3 (three) times daily as needed for muscle spasms. 04/19/17  Yes Newman Pies, MD  docusate sodium (COLACE) 100 MG capsule Take 1 capsule (100 mg total) by mouth 2 (two) times daily. 04/19/17  Yes Newman Pies, MD  DULoxetine (CYMBALTA) 20 MG capsule Take 20 mg by mouth daily as needed. Will take in the mornings if needed   Yes [provider]  DULoxetine (CYMBALTA) 60 MG capsule Take 60 mg by mouth every evening.    Yes [provider]  finasteride (PROSCAR) 5  MG tablet Take 5 mg by mouth every evening.    Yes [provider]  fluticasone (FLONASE) 50 MCG/ACT nasal spray Place 2 sprays into both nostrils daily as needed for allergies.    Yes [provider]  fluticasone furoate-vilanterol (BREO ELLIPTA) 100-25 MCG/INH AEPB Inhale 1 puff into the lungs daily. 01/18/17  Yes Juanito Doom, MD  folic acid (FOLVITE) 1 MG tablet Take 1 mg by mouth daily.   Yes [provider]  gabapentin (NEURONTIN) 300 MG capsule Take 600 mg by mouth 3 (three) times daily.   Yes [provider]  hydroxychloroquine (PLAQUENIL) 200 MG tablet Take 200 mg by mouth 2 (two) times daily.    Yes [provider]  InFLIXimab (REMICADE IV) Inject into the vein every 8 (eight) weeks.    Yes [provider]  lisinopril (PRINIVIL,ZESTRIL) 20 MG tablet Take 20 mg by mouth daily.   Yes [provider]  metoprolol succinate (TOPROL-XL) 50 MG 24 hr tablet Take 50 mg by mouth every evening. Take with or immediately following a meal.   Yes [provider]  omeprazole (PRILOSEC) 40 MG capsule Take 40 mg by mouth every evening.    Yes [provider]  oxyCODONE (OXY IR/ROXICODONE) 5 MG immediate release tablet Take 1-2 tablets (5-10 mg total) by mouth every 3 (three) hours as needed for breakthrough pain. 04/19/17  Yes Newman Pies, MD  predniSONE (DELTASONE) 5 MG tablet Take 5 mg by mouth daily with breakfast.   Yes [provider]  sennosides-docusate sodium (SENOKOT-S) 8.6-50 MG tablet Take 2 tablets by mouth every evening.   Yes [provider]  simvastatin (ZOCOR) 40 MG tablet Take 40 mg by mouth daily at 6 PM.   Yes [provider]  sodium chloride (OCEAN) 0.65 % SOLN nasal spray Place 1 spray into both nostrils as needed for congestion.   Yes [provider]  tamsulosin (FLOMAX) 0.4 MG CAPS capsule Take 0.4 mg by mouth daily.   Yes [provider]  Tiotropium  Bromide Monohydrate (SPIRIVA RESPIMAT) 2.5 MCG/ACT AERS Inhale 2 puffs into the lungs daily. 01/18/17  Yes Juanito Doom, MD  traMADol (ULTRAM) 50 MG tablet Take 50-100 mg by mouth every 12 (twelve) hours as needed for severe pain.    Yes [provider]  traZODone (DESYREL) 50 MG tablet Take 50 mg by mouth at bedtime.   Yes [provider]  methotrexate 2.5 MG tablet Take 25 mg by mouth once a week. Saturday    [provider]     Review of Systems  Positive ROS:  As above  All other systems have been reviewed and were otherwise negative with the exception of those mentioned in the HPI and as above.  Objective: Vital signs in last 24 hours: Temp:  [97.9 F (36.6 C)] 97.9 F (36.6 C) (08/06 1810) Pulse Rate:  [97] 97 (08/06 1810) Resp:  [20] 20 (08/06 1810) BP: (132)/(69) 132/69 (08/06 1810) SpO2:  [98 %] 98 % (08/06 1810) Weight:  [104.3 kg (230 lb)] 104.3 kg (230 lb) (08/06 1810)   physical exam:   General:  An alert and pleasant 76 year old white male in no apparent distress.  HEENT:  Unremarkable  Neck: Supple with a normal range of motion  Thorax:  Symmetric  Abdomen:Obese  Neurologic exam:  The patient is alert and oriented x3.  His strength is grossly normal in his lower extremities.  His gait is unremarkable.    Lumbar wound:  The patient's lumbar wound is sutured.  There is no  Erythema or purulent discharge.  The patient  haddark watery discharge from the middle of the wound  Which stopped after I stapled it.     Data Review Lab Results  Component Value Date   WBC 12.9 (H) 04/18/2017   HGB 12.6 (L) 04/18/2017   HCT 38.8 (L) 04/18/2017   MCV 105.4 (H) 04/18/2017   PLT 391 04/18/2017   Lab Results  Component Value Date   NA 134 (L) 04/18/2017   K 4.6 04/18/2017   CL 100 (L) 04/18/2017   CO2 25 04/18/2017   BUN 14 04/18/2017   CREATININE 0.88 04/18/2017   GLUCOSE 93 04/18/2017   No results found for: INR,  PROTIME  Assessment/Plan:   Persistent lumbar pseudomeningocele, wound drainage: I have discussed the situation with the patient, his wife, and son.  I have recommended that he be admitted for observation and further workup.  I recommended a lumbar myelogram to see if we can identify as CSF leak.  The pending with this shows we may want to place lumbar drain or re-explore his wound.  Another possibility is infection but this is not seem to be the case clinically.  We will check a CRP, sed rate, CBC, etc.   Flavio Lindroth D 05/02/2017 7:12 PM

## 2017-05-02 NOTE — Progress Notes (Signed)
Pt. Placed on  cpap per home use, pt. States his pressure setting is 8 cmh20. Pt. Has his own nasal pillows from home. No issues at this time.

## 2017-05-02 NOTE — Progress Notes (Signed)
Pt admitted to the unit as a direct admit from MD office. Pt A&O x4; MAE x4 with full sensation all over; skin intact with no pressure ulcer or opened wounds except for back incision which had clean, dry gauze dsg intact. Pt BUE has ecchymosis; +1 pitting edema BLE; pt oriented to the unit and room; fall/safety precaution and prevention education completed. Pt voices understanding and denies any questions. IV established to right hand; MD paged and Dr. Annette Stable notified of pt's arrival to the unit. Awaiting on orders for pt. Pt in bed with call light within reach. Will closely monitor. Delia Heady RN

## 2017-05-03 ENCOUNTER — Inpatient Hospital Stay (HOSPITAL_COMMUNITY): Payer: Medicare Other

## 2017-05-03 MED ORDER — VANCOMYCIN HCL IN DEXTROSE 1-5 GM/200ML-% IV SOLN
1000.0000 mg | Freq: Two times a day (BID) | INTRAVENOUS | Status: DC
  Administered 2017-05-03 – 2017-05-06 (×6): 1000 mg via INTRAVENOUS
  Filled 2017-05-03 (×7): qty 200

## 2017-05-03 MED ORDER — DEXTROSE 5 % IV SOLN
2.0000 g | INTRAVENOUS | Status: DC
  Administered 2017-05-03 – 2017-05-04 (×2): 2 g via INTRAVENOUS
  Filled 2017-05-03 (×2): qty 2

## 2017-05-03 MED ORDER — LIDOCAINE-EPINEPHRINE 1 %-1:100000 IJ SOLN
INTRAMUSCULAR | Status: AC
  Filled 2017-05-03: qty 1

## 2017-05-03 NOTE — Progress Notes (Signed)
[name], male   DOB: Feb 07, 1941, 76 y.o.   MRN: [id number] I spoke with the patient.  I have told him  That his sed rate and C reactive protein her quite elevated raising the concern of infection.  At this point I have recommended we start him on antibiotics empirically and await his culture results.  If his cultures were negative we may reconsider his myelogram later this week.  I have answered all his questions.  He is agreeable to this plan.

## 2017-05-03 NOTE — Progress Notes (Signed)
Patient with persistent lumbar pseudomeningocele with drainage.  IR consulted for aspiration of lumbar fluid collection at the request of Dr. [name].  Case reviewed with Dr. Pascal Lux.  Will plan for US guided aspiration without sedation.  Floor RN aware of possible procedure today.   Brynda Greathouse, MMS RDN PA-C 12:13 PM

## 2017-05-03 NOTE — Procedures (Signed)
Pre Procedure Dx: Paraspinal fluid collection Post Procedural Dx: Same  Technically successful US guided aspiration of paraspinal fluid collection yielding 50 cc of brown colored fluid.  EBL: None  No immediate complications.   Ronny Bacon, MD Pager #: [phone]

## 2017-05-03 NOTE — Progress Notes (Signed)
[name], male   DOB: November 06, 1940, 76 y.o.   MRN: [id number] Subjective:  The patient is alert and pleasant. He has no headaches. He is in no apparent distress.  Objective: Vital signs in last 24 hours: Temp:  [97.9 F (36.6 C)-99.7 F (37.6 C)] 98.4 F (36.9 C) (08/07 0508) Pulse Rate:  [64-97] 64 (08/07 0508) Resp:  [20] 20 (08/07 0508) BP: (132-145)/(69-74) 135/69 (08/07 0508) SpO2:  [94 %-98 %] 94 % (08/07 0508) Weight:  [104.3 kg (230 lb)] 104.3 kg (230 lb) (08/06 1810)  Intake/Output from previous day: 08/06 0701 - 08/07 0700 In: 240 [P.O.:240] Out: 1900 [Urine:1900] Intake/Output this shift: No intake/output data recorded.  Physical exam the patient is alert and oriented. His strength is normal in his lower extremities. The dressing I put on in the office yesterday around 5 PM has minimal drainage.  Lab Results:  Recent Labs  05/02/17 2031  WBC 14.5*  HGB 12.1*  HCT 37.2*  PLT 402*   BMET  Recent Labs  05/02/17 2031  NA 134*  K 3.9  CL 96*  CO2 29  GLUCOSE 88  BUN 11  CREATININE 0.91  CALCIUM 9.3    Studies/Results: No results found.  Assessment/Plan: Wound drainage: I suspect he has a spinal fluid leak. We are going to get a lumbar myelogram on him with CSF cultures. If there is a large CSF fistula he will need surgery. If there is a small fistula I'll plan to place a lumbar drain. I have answered all his questions.  LOS: 1 day     Dorine Duffey D 05/03/2017, 7:49 AM

## 2017-05-03 NOTE — Progress Notes (Signed)
Pharmacy Antibiotic Note  [name] is a 76 y.o. male admitted on 05/02/2017 with possible post spinal surgery wound infection, suspect he has a spinal fluid leak. Pharmacy has been consulted for vancomycin and rocephin  Dosing. ESR 113, CRP 9.1, Wbc 14.5, scr 0.9, est. crcl ~ 80 ml/min.   Plan: - Vancomycin 1g IV Q 12 hrs - Rocephin 2g IV Q 24 hrs - Monitor renal function and f/u cultures  Weight: 230 lb (104.3 kg)  Temp (24hrs), Avg:98.7 F (37.1 C), Min:97.9 F (36.6 C), Max:99.7 F (37.6 C)   Recent Labs Lab 05/02/17 2031  WBC 14.5*  CREATININE 0.91    Estimated Creatinine Clearance: 83.4 mL/min (by C-G formula based on SCr of 0.91 mg/dL).    Allergies  Allergen Reactions  . Sulfa Antibiotics Shortness Of Breath  . Varenicline Other (See Comments)    Strange thoughts    Antimicrobials this admission: Vancomycin 8/7 >>  Rocephin 8/7 >>   Dose adjustments this admission:   Microbiology results: 8/6 BCx:  8/7 paraspinal fluid: 8/7 CSF:   Thank you for allowing pharmacy to be a part of this patient's care.  Maryanna Shape, PharmD, BCPS  Clinical Pharmacist  Pager: [phone]   05/03/2017 5:24 PM

## 2017-05-04 LAB — MISC LABCORP TEST (SEND OUT)
LABCORP TEST CODE: 19588
Labcorp test code: 19497

## 2017-05-04 MED ORDER — DEXTROSE 5 % IV SOLN
2.0000 g | Freq: Three times a day (TID) | INTRAVENOUS | Status: DC
  Administered 2017-05-04 – 2017-05-06 (×5): 2 g via INTRAVENOUS
  Filled 2017-05-04 (×6): qty 2

## 2017-05-04 NOTE — Progress Notes (Signed)
MEDICATION RELATED CONSULT NOTE - INITIAL   There is a lumbar wound cx from 7/23 that is growing pan sensitive pseudomonas. Discussed with on call Neurosurgery, Dr. Sherwood Gambler, and will change ceftriaxone to cefepime for pseudomonal coverage but Rx will follow up tomorrow during the day with Dr. [name] as well.  Elenor Quinones, PharmD, BCPS Clinical Pharmacist Pager [phone] 05/04/2017 6:00 PM

## 2017-05-04 NOTE — Progress Notes (Signed)
[name], male   DOB: Feb 24, 1941, 76 y.o.   MRN: [id number] Subjective:  The patient is alert and pleasant. He is in no apparent distress.  Objective: Vital signs in last 24 hours: Temp:  [97.9 F (36.6 C)-99.5 F (37.5 C)] 97.9 F (36.6 C) (08/08 0843) Pulse Rate:  [83-94] 94 (08/08 0843) Resp:  [18-19] 18 (08/08 0843) BP: (126-152)/(70-83) 126/74 (08/08 0843) SpO2:  [91 %-95 %] 91 % (08/08 1157)  Intake/Output from previous day: 08/07 0701 - 08/08 0700 In: 250 [IV Piggyback:250] Out: 650 [Urine:650] Intake/Output this shift: Total I/O In: 360 [P.O.:360] Out: -   Physical exam the patient is alert and pleasant. He has minimal drainage on his dressing. He is movinghis lower extremities well.  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 Please from ultrasound-guided fluid aspiration for diagnostic purposes. EXAM: US ASPIRATION COMPARISON:  Lumbar spine MRI - 04/07/2017 MEDICATIONS: The patient is currently admitted to the hospital and receiving intravenous antibiotics. The antibiotics were administered within an appropriate time frame prior to the initiation of the procedure. ANESTHESIA/SEDATION: None CONTRAST:  None COMPLICATIONS: None immediate. PROCEDURE: Informed written consent was obtained from the patient after a discussion of the risks, benefits and alternatives to treatment. Preprocedural ultrasound scanning demonstrated a serpiginous mixed echogenic fluid collection about the subcutaneous tissues deep to the skin staples within the  midline of the low back with dominant serpiginous component measuring at least 5.2 x 3.5 x 3.4 cm (images 2 and 7). A timeout was performed prior to the initiation of the procedure. The skin overlying the midline of the low back was prepped and draped in the usual sterile fashion. The overlying soft tissues were anesthetized with 1% lidocaine with epinephrine. Under direct ultrasound guidance, a 18 gauge trocar needle was advanced into the paraspinal fluid collection. Title some image was saved for procedural documentation purposes. Next, approximately 50 cc of brown colored fluid was aspirated from the collection. All aspirated fluid was capped and sent to the laboratory for analysis. Superficial hemostasis was achieved with manual compression. A dressing was placed. The patient tolerated the procedure well without immediate postprocedural complication. IMPRESSION: Successful US guided of approximately 50 cc of brown colored fluid from the subcutaneous tissues adjacent to the midline low back skin staples. Samples were sent to the laboratory as requested by the ordering clinical team. Electronically Signed   By: Sandi Mariscal M.D.   On: 05/03/2017 14:51    Assessment/Plan: Wound drainage: I have discussed the situation with the patient and his wife. The patient has a leukocytosis and elevated C reactive protein and sedimentation rate concerning for infection. He is has rheumatoid arthritis which may be confounding the issue. The patient doesn't have clinical signs of low-pressure headaches, i.e. spinal fluid leak. I'm going to cancel the myelogram. I recommended an incision and drainage of his wound and placement of a wound drain. I have explained the procedure, the risks, benefits and alternatives. I answered all their questions. He wants to proceed as  recommended.  LOS: 2 days     Kohler Pellerito D 05/04/2017, 12:11 PM

## 2017-05-05 ENCOUNTER — Inpatient Hospital Stay (HOSPITAL_COMMUNITY): Payer: Medicare Other | Admitting: Certified Registered"

## 2017-05-05 ENCOUNTER — Encounter (HOSPITAL_COMMUNITY): Payer: Self-pay | Admitting: *Deleted

## 2017-05-05 ENCOUNTER — Encounter (HOSPITAL_COMMUNITY)
Admission: AD | Disposition: A | Discharge status: Home Health | Payer: Self-pay | Source: Ambulatory Visit | Attending: Neurosurgery

## 2017-05-05 HISTORY — PX: LUMBAR WOUND DEBRIDEMENT: SHX1988

## 2017-05-05 LAB — SURGICAL PCR SCREEN
MRSA, PCR: NEGATIVE
Staphylococcus aureus: NEGATIVE

## 2017-05-05 SURGERY — LUMBAR WOUND DEBRIDEMENT
Anesthesia: General

## 2017-05-05 MED ORDER — HYDROMORPHONE HCL 1 MG/ML IJ SOLN
INTRAMUSCULAR | Status: AC
  Administered 2017-05-05: 0.5 mg via INTRAVENOUS
  Filled 2017-05-05: qty 1

## 2017-05-05 MED ORDER — ONDANSETRON HCL 4 MG/2ML IJ SOLN
INTRAMUSCULAR | Status: AC
  Filled 2017-05-05: qty 2

## 2017-05-05 MED ORDER — ALBUTEROL SULFATE HFA 108 (90 BASE) MCG/ACT IN AERS
INHALATION_SPRAY | RESPIRATORY_TRACT | Status: DC | PRN
  Administered 2017-05-05: 4 via RESPIRATORY_TRACT

## 2017-05-05 MED ORDER — PHENOL 1.4 % MT LIQD: 1.0000 | OROMUCOSAL | Status: DC | PRN

## 2017-05-05 MED ORDER — PROMETHAZINE HCL 25 MG/ML IJ SOLN: 6.2500 mg | INTRAMUSCULAR | Status: DC | PRN

## 2017-05-05 MED ORDER — SODIUM CHLORIDE 0.9 % IV SOLN
250.0000 mL | INTRAVENOUS | Status: DC
  Administered 2017-05-05: 250 mL via INTRAVENOUS

## 2017-05-05 MED ORDER — DEXAMETHASONE SODIUM PHOSPHATE 10 MG/ML IJ SOLN
INTRAMUSCULAR | Status: DC | PRN
  Administered 2017-05-05: 10 mg via INTRAVENOUS

## 2017-05-05 MED ORDER — BACITRACIN ZINC 500 UNIT/GM EX OINT
TOPICAL_OINTMENT | CUTANEOUS | Status: DC | PRN
Start: 2017-05-05 — End: 2017-05-05
  Administered 2017-05-05: 1 via TOPICAL

## 2017-05-05 MED ORDER — SCOPOLAMINE 1 MG/3DAYS TD PT72
MEDICATED_PATCH | TRANSDERMAL | Status: AC
  Filled 2017-05-05: qty 1

## 2017-05-05 MED ORDER — VANCOMYCIN HCL 1000 MG IV SOLR
INTRAVENOUS | Status: AC
  Filled 2017-05-05: qty 1000

## 2017-05-05 MED ORDER — DEXAMETHASONE SODIUM PHOSPHATE 10 MG/ML IJ SOLN
INTRAMUSCULAR | Status: AC
  Filled 2017-05-05: qty 1

## 2017-05-05 MED ORDER — ACETAMINOPHEN 325 MG PO TABS: 650.0000 mg | ORAL_TABLET | ORAL | Status: DC | PRN

## 2017-05-05 MED ORDER — THROMBIN 5000 UNITS EX SOLR
CUTANEOUS | Status: DC | PRN
  Administered 2017-05-05: 10000 [IU] via TOPICAL

## 2017-05-05 MED ORDER — ONDANSETRON HCL 4 MG/2ML IJ SOLN
INTRAMUSCULAR | Status: DC | PRN
  Administered 2017-05-05: 4 mg via INTRAVENOUS

## 2017-05-05 MED ORDER — SUGAMMADEX SODIUM 200 MG/2ML IV SOLN
INTRAVENOUS | Status: AC
  Filled 2017-05-05: qty 2

## 2017-05-05 MED ORDER — BACITRACIN ZINC 500 UNIT/GM EX OINT
TOPICAL_OINTMENT | CUTANEOUS | Status: AC
  Filled 2017-05-05: qty 28.35

## 2017-05-05 MED ORDER — HEMOSTATIC AGENTS (NO CHARGE) OPTIME
TOPICAL | Status: DC | PRN
  Administered 2017-05-05: 1 via TOPICAL

## 2017-05-05 MED ORDER — PHENYLEPHRINE HCL 10 MG/ML IJ SOLN
INTRAVENOUS | Status: DC | PRN
  Administered 2017-05-05: 50 ug/min via INTRAVENOUS

## 2017-05-05 MED ORDER — CYCLOBENZAPRINE HCL 10 MG PO TABS
10.0000 mg | ORAL_TABLET | Freq: Three times a day (TID) | ORAL | Status: DC | PRN
  Administered 2017-05-05 – 2017-05-08 (×4): 10 mg via ORAL
  Filled 2017-05-05 (×4): qty 1

## 2017-05-05 MED ORDER — CALCIUM CHLORIDE 10 % IV SOLN
INTRAVENOUS | Status: DC | PRN
  Administered 2017-05-05: 200 mg via INTRAVENOUS
  Administered 2017-05-05: 100 mg via INTRAVENOUS
  Administered 2017-05-05: 200 mg via INTRAVENOUS
  Administered 2017-05-05: 100 mg via INTRAVENOUS

## 2017-05-05 MED ORDER — MENTHOL 3 MG MT LOZG: 1.0000 | LOZENGE | OROMUCOSAL | Status: DC | PRN

## 2017-05-05 MED ORDER — MEPERIDINE HCL 25 MG/ML IJ SOLN: 6.2500 mg | INTRAMUSCULAR | Status: DC | PRN

## 2017-05-05 MED ORDER — LIDOCAINE HCL (CARDIAC) 20 MG/ML IV SOLN
INTRAVENOUS | Status: DC | PRN
  Administered 2017-05-05: 100 mg via INTRAVENOUS

## 2017-05-05 MED ORDER — 0.9 % SODIUM CHLORIDE (POUR BTL) OPTIME
TOPICAL | Status: DC | PRN
  Administered 2017-05-05: 1000 mL

## 2017-05-05 MED ORDER — CALCIUM CHLORIDE 10 % IV SOLN
INTRAVENOUS | Status: AC
  Filled 2017-05-05: qty 10

## 2017-05-05 MED ORDER — SODIUM CHLORIDE 0.9% FLUSH
3.0000 mL | Freq: Two times a day (BID) | INTRAVENOUS | Status: DC
  Administered 2017-05-05 – 2017-05-07 (×4): 3 mL via INTRAVENOUS

## 2017-05-05 MED ORDER — SODIUM CHLORIDE 0.9% FLUSH: 3.0000 mL | INTRAVENOUS | Status: DC | PRN

## 2017-05-05 MED ORDER — FENTANYL CITRATE (PF) 100 MCG/2ML IJ SOLN
INTRAMUSCULAR | Status: DC | PRN
  Administered 2017-05-05: 100 ug via INTRAVENOUS
  Administered 2017-05-05: 50 ug via INTRAVENOUS

## 2017-05-05 MED ORDER — ROCURONIUM BROMIDE 100 MG/10ML IV SOLN
INTRAVENOUS | Status: DC | PRN
  Administered 2017-05-05: 40 mg via INTRAVENOUS

## 2017-05-05 MED ORDER — LIDOCAINE 2% (20 MG/ML) 5 ML SYRINGE
INTRAMUSCULAR | Status: AC
  Filled 2017-05-05: qty 5

## 2017-05-05 MED ORDER — OXYCODONE HCL 5 MG PO TABS
5.0000 mg | ORAL_TABLET | ORAL | Status: DC | PRN
  Administered 2017-05-05 – 2017-05-07 (×5): 10 mg via ORAL
  Administered 2017-05-07 – 2017-05-08 (×3): 5 mg via ORAL
  Filled 2017-05-05: qty 1
  Filled 2017-05-05 (×4): qty 2
  Filled 2017-05-05 (×2): qty 1
  Filled 2017-05-05: qty 2

## 2017-05-05 MED ORDER — ALBUMIN HUMAN 5 % IV SOLN
INTRAVENOUS | Status: DC | PRN
  Administered 2017-05-05: 14:00:00 via INTRAVENOUS

## 2017-05-05 MED ORDER — SUGAMMADEX SODIUM 200 MG/2ML IV SOLN
INTRAVENOUS | Status: DC | PRN
  Administered 2017-05-05: 200 mg via INTRAVENOUS

## 2017-05-05 MED ORDER — PHENYLEPHRINE HCL 10 MG/ML IJ SOLN
INTRAMUSCULAR | Status: DC | PRN
  Administered 2017-05-05 (×4): 200 ug via INTRAVENOUS

## 2017-05-05 MED ORDER — LACTATED RINGERS IV SOLN
INTRAVENOUS | Status: DC
  Administered 2017-05-05: 13:00:00 via INTRAVENOUS

## 2017-05-05 MED ORDER — FENTANYL CITRATE (PF) 250 MCG/5ML IJ SOLN
INTRAMUSCULAR | Status: AC
  Filled 2017-05-05: qty 5

## 2017-05-05 MED ORDER — LIDOCAINE-EPINEPHRINE 1 %-1:100000 IJ SOLN
INTRAMUSCULAR | Status: AC
  Filled 2017-05-05: qty 1

## 2017-05-05 MED ORDER — ROCURONIUM BROMIDE 10 MG/ML (PF) SYRINGE
PREFILLED_SYRINGE | INTRAVENOUS | Status: AC
  Filled 2017-05-05: qty 5

## 2017-05-05 MED ORDER — BISACODYL 10 MG RE SUPP
10.0000 mg | Freq: Every day | RECTAL | Status: DC | PRN
  Administered 2017-05-07: 10 mg via RECTAL
  Filled 2017-05-05: qty 1

## 2017-05-05 MED ORDER — HYDROMORPHONE HCL 1 MG/ML IJ SOLN
0.2500 mg | INTRAMUSCULAR | Status: DC | PRN
  Administered 2017-05-05 (×2): 0.5 mg via INTRAVENOUS

## 2017-05-05 MED ORDER — PROPOFOL 10 MG/ML IV BOLUS
INTRAVENOUS | Status: AC
  Filled 2017-05-05: qty 20

## 2017-05-05 MED ORDER — ONDANSETRON HCL 4 MG/2ML IJ SOLN: 4.0000 mg | Freq: Four times a day (QID) | INTRAMUSCULAR | Status: DC | PRN

## 2017-05-05 MED ORDER — PROPOFOL 10 MG/ML IV BOLUS
INTRAVENOUS | Status: DC | PRN
  Administered 2017-05-05: 150 mg via INTRAVENOUS

## 2017-05-05 MED ORDER — METOPROLOL TARTRATE 5 MG/5ML IV SOLN
INTRAVENOUS | Status: DC | PRN
  Administered 2017-05-05: 2 mg via INTRAVENOUS

## 2017-05-05 MED ORDER — THROMBIN 5000 UNITS EX SOLR
CUTANEOUS | Status: AC
  Filled 2017-05-05: qty 10000

## 2017-05-05 MED ORDER — ONDANSETRON HCL 4 MG PO TABS: 4.0000 mg | ORAL_TABLET | Freq: Four times a day (QID) | ORAL | Status: DC | PRN

## 2017-05-05 MED ORDER — ACETAMINOPHEN 650 MG RE SUPP: 650.0000 mg | RECTAL | Status: DC | PRN

## 2017-05-05 MED ORDER — DOCUSATE SODIUM 100 MG PO CAPS
100.0000 mg | ORAL_CAPSULE | Freq: Two times a day (BID) | ORAL | Status: DC
  Administered 2017-05-05 – 2017-05-08 (×6): 100 mg via ORAL
  Filled 2017-05-05 (×6): qty 1

## 2017-05-05 MED ORDER — MORPHINE SULFATE (PF) 4 MG/ML IV SOLN: 4.0000 mg | INTRAVENOUS | Status: DC | PRN

## 2017-05-05 MED ORDER — BUPIVACAINE-EPINEPHRINE (PF) 0.5% -1:200000 IJ SOLN
INTRAMUSCULAR | Status: AC
  Filled 2017-05-05: qty 30

## 2017-05-05 SURGICAL SUPPLY — 41 items
APL SKNCLS STERI-STRIP NONHPOA (GAUZE/BANDAGES/DRESSINGS)
BAG DECANTER FOR FLEXI CONT (MISCELLANEOUS) ×2 IMPLANT
BENZOIN TINCTURE PRP APPL 2/3 (GAUZE/BANDAGES/DRESSINGS) ×1 IMPLANT
BLADE CLIPPER SURG (BLADE) IMPLANT
CANISTER SUCT 3000ML PPV (MISCELLANEOUS) ×2 IMPLANT
CARTRIDGE OIL MAESTRO DRILL (MISCELLANEOUS) ×1 IMPLANT
DIFFUSER DRILL AIR PNEUMATIC (MISCELLANEOUS) ×1 IMPLANT
DRAPE LAPAROTOMY 100X72X124 (DRAPES) ×2 IMPLANT
DRAPE POUCH INSTRU U-SHP 10X18 (DRAPES) ×2 IMPLANT
DRAPE SURG 17X23 STRL (DRAPES) ×8 IMPLANT
ELECT REM PT RETURN 9FT ADLT (ELECTROSURGICAL) ×2
ELECTRODE REM PT RTRN 9FT ADLT (ELECTROSURGICAL) ×1 IMPLANT
EVACUATOR 1/8 PVC DRAIN (DRAIN) ×1 IMPLANT
GAUZE SPONGE 4X4 12PLY STRL (GAUZE/BANDAGES/DRESSINGS) ×1 IMPLANT
GAUZE SPONGE 4X4 16PLY XRAY LF (GAUZE/BANDAGES/DRESSINGS) IMPLANT
GLOVE BIO SURGEON STRL SZ8 (GLOVE) ×2 IMPLANT
GLOVE BIO SURGEON STRL SZ8.5 (GLOVE) ×2 IMPLANT
GLOVE EXAM NITRILE LRG STRL (GLOVE) IMPLANT
GLOVE EXAM NITRILE XL STR (GLOVE) IMPLANT
GLOVE EXAM NITRILE XS STR PU (GLOVE) IMPLANT
GOWN STRL REUS W/ TWL LRG LVL3 (GOWN DISPOSABLE) IMPLANT
GOWN STRL REUS W/ TWL XL LVL3 (GOWN DISPOSABLE) IMPLANT
GOWN STRL REUS W/TWL LRG LVL3 (GOWN DISPOSABLE)
GOWN STRL REUS W/TWL XL LVL3 (GOWN DISPOSABLE)
KIT BASIN OR (CUSTOM PROCEDURE TRAY) ×2 IMPLANT
KIT ROOM TURNOVER OR (KITS) ×2 IMPLANT
NEEDLE HYPO 22GX1.5 SAFETY (NEEDLE) IMPLANT
NS IRRIG 1000ML POUR BTL (IV SOLUTION) ×2 IMPLANT
OIL CARTRIDGE MAESTRO DRILL (MISCELLANEOUS)
PACK LAMINECTOMY NEURO (CUSTOM PROCEDURE TRAY) ×2 IMPLANT
PAD ARMBOARD 7.5X6 YLW CONV (MISCELLANEOUS) ×6 IMPLANT
STRIP CLOSURE SKIN 1/2X4 (GAUZE/BANDAGES/DRESSINGS) ×1 IMPLANT
SUT ETHILON 2 0 PSLX (SUTURE) ×1 IMPLANT
SUT VIC AB 1 CT1 18XBRD ANBCTR (SUTURE) ×1 IMPLANT
SUT VIC AB 1 CT1 8-18 (SUTURE) ×2
SUT VIC AB 2-0 CP2 18 (SUTURE) ×2 IMPLANT
SWAB COLLECTION DEVICE MRSA (MISCELLANEOUS) IMPLANT
SWAB CULTURE ESWAB REG 1ML (MISCELLANEOUS) IMPLANT
TOWEL GREEN STERILE (TOWEL DISPOSABLE) ×2 IMPLANT
TOWEL GREEN STERILE FF (TOWEL DISPOSABLE) ×2 IMPLANT
WATER STERILE IRR 1000ML POUR (IV SOLUTION) ×2 IMPLANT

## 2017-05-05 NOTE — Transfer of Care (Signed)
Immediate Anesthesia Transfer of Care Note  Patient: [name]  Procedure(s) Performed: Procedure(s): I&D Lumbar Wound (N/A)  Patient Location: PACU  Anesthesia Type:General  Level of Consciousness: awake, alert , oriented and patient cooperative  Airway & Oxygen Therapy: Patient Spontanous Breathing and Patient connected to nasal cannula oxygen  Post-op Assessment: Report given to RN, Post -op Vital signs reviewed and stable and Patient moving all extremities X 4  Post vital signs: Reviewed and stable  Last Vitals:  Vitals:   05/05/17 0732 05/05/17 1221  BP: (!) 145/75 (!) 141/69  Pulse: (!) 58 97  Resp: 18 18  Temp: 37.1 C 37.1 C  SpO2: 94% 93%    Last Pain:  Vitals:   05/05/17 1221  TempSrc: Oral  PainSc:       Patients Stated Pain Goal: 0 (57/49/35 5217)  Complications: No apparent anesthesia complications

## 2017-05-05 NOTE — Anesthesia Postprocedure Evaluation (Signed)
Anesthesia Post Note  Patient: [name]  Procedure(s) Performed: Procedure(s) (LRB): I&D Lumbar Wound (N/A)     Patient location during evaluation: PACU Anesthesia Type: General Level of consciousness: sedated and patient cooperative Pain management: pain level controlled Vital Signs Assessment: post-procedure vital signs reviewed and stable Respiratory status: spontaneous breathing Cardiovascular status: stable Anesthetic complications: no    Last Vitals:  Vitals:   05/05/17 1550 05/05/17 1603  BP: 125/83 134/70  Pulse: 95 96  Resp: 20 18  Temp:  36.7 C  SpO2: 95% 95%    Last Pain:  Vitals:   05/05/17 1603  TempSrc:   PainSc: [place]

## 2017-05-05 NOTE — Op Note (Signed)
Brief history: The patient is a 76 year old white male on whom I performed a lumbar instrumentation and fusion over 2 months ago. At surgery he had a spinal fluid leak. He has had some persistent subcutaneous fluid postoperatively. I performed an incision and drainage about 2 weeks ago. His fluid collection has recurred. He has elevated white count and C-reactive protein/sedimentation rate. I recommended an incision and drainage of his wound. The patient has weighed the risks, benefits, and alternatives and decided to proceed with the operation.  Preoperative diagnosis: Wound drainage, wound infection  Postop diagnosis: The same  Procedure: Exploration of lumbar wound, incision and drainage of lumbar wound  Surgeon: Dr. Earle Gell  Assistant: None  Anesthesia: Gen. tracheal  Estimated blood loss: Minimal  Specimens: Culture of the subcutaneous fluid  Complications: None  Drains: One medium Hemovac drain in the epidural space  Description of procedure: The patient was brought to the operating room by the anesthesia team. General endotracheal anesthesia was induced. The patient was turned to the prone position on the Wilson frame. His lumbosacral region was then prepared with Betadine scrub and Betadine solution. I then removed the patient's wound staple and sutures. Sterile drapes were applied. I then used a scalpel to make an incision through the middle of the patient's old scar. An approximately a centimeter deep I encountered thin cloudy purulent appearing fluid. We obtain cultures. I used a cerebellar retractor for exposure. I dissected into the cavity deeper and entered the epidural space. I inspected the dura. I didn't see any spinal fluid leakage. We had anesthesia Valsalva the patient there was still no leakage. I then irrigated the wound out with bacitracin solution. We obtained hemostasis using bipolar electrocautery. I placed vancomycin powder in the wound. I removed the retractor.  I placed a medium Hemovac in the epidural space and tunneled out through a separate stab wound. I then reapproximated the patient's fascia with interrupted #1 Vicryl suture. I reapproximated the subcutaneous tissue with interrupted 2-0 Vicryl suture. I reapproximated the skin with a running 2-0 nylon suture. The wound was then coated with bacitracin ointment. A sterile dressing was applied. The drapes were removed. The patient was returned to the supine position. By report all sponge, instrument, and needle counts were correct at the end of this case

## 2017-05-05 NOTE — Progress Notes (Addendum)
Pt returned from PACU at this time.  VSS. Alert and oriented.  Dressing to back CDI. Denies nausea and tingling. Spouse at bedside.    Pt returned from PACU with skin tear to left forearm covered with tegaderm.

## 2017-05-05 NOTE — Progress Notes (Signed)
Subjective:  Patient is alert and pleasant. His wife is at the bedside.  Objective: Vital signs in last 24 hours: Temp:  [98.2 F (36.8 C)-99.3 F (37.4 C)] 98.8 F (37.1 C) (08/09 1221) Pulse Rate:  [58-108] 97 (08/09 1221) Resp:  [18] 18 (08/09 1221) BP: (124-145)/(58-85) 141/69 (08/09 1221) SpO2:  [92 %-94 %] 93 % (08/09 1221) Weight:  [104.3 kg (230 lb)] 104.3 kg (230 lb) (08/09 1300)  Intake/Output from previous day: 08/08 0701 - 08/09 0700 In: 1100 [P.O.:600; IV Piggyback:500] Out: 4888 [Urine:1825] Intake/Output this shift: No intake/output data recorded.  Physical exam the patient is alert and pleasant. He is moving his lower extremities well.  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 Please from ultrasound-guided fluid aspiration for diagnostic purposes. EXAM: US ASPIRATION COMPARISON:  Lumbar spine MRI - 04/07/2017 MEDICATIONS: The patient is currently admitted to the hospital and receiving intravenous antibiotics. The antibiotics were administered within an appropriate time frame prior to the initiation of the procedure. ANESTHESIA/SEDATION: None CONTRAST:  None COMPLICATIONS: None immediate. PROCEDURE: Informed written consent was obtained from the patient after a discussion of the risks, benefits and alternatives to treatment. Preprocedural ultrasound scanning demonstrated a serpiginous mixed echogenic fluid collection about the subcutaneous tissues deep to the skin staples within the midline of the low back with dominant serpiginous component  measuring at least 5.2 x 3.5 x 3.4 cm (images 2 and 7). A timeout was performed prior to the initiation of the procedure. The skin overlying the midline of the low back was prepped and draped in the usual sterile fashion. The overlying soft tissues were anesthetized with 1% lidocaine with epinephrine. Under direct ultrasound guidance, a 18 gauge trocar needle was advanced into the paraspinal fluid collection. Title some image was saved for procedural documentation purposes. Next, approximately 50 cc of brown colored fluid was aspirated from the collection. All aspirated fluid was capped and sent to the laboratory for analysis. Superficial hemostasis was achieved with manual compression. A dressing was placed. The patient tolerated the procedure well without immediate postprocedural complication. IMPRESSION: Successful US guided of approximately 50 cc of brown colored fluid from the subcutaneous tissues adjacent to the midline low back skin staples. Samples were sent to the laboratory as requested by the ordering clinical team. Electronically Signed   By: Sandi Mariscal M.D.   On: 05/03/2017 14:51    Assessment/Plan: Wound drainage: I discussed the situation with the patient. I think this is likely secondary to an infection. I recommended incision and drainage of his wound with exploration looking for spinal fluid leak. I answered all his questions regarding surgery.  LOS: 3 days     Kamauri Kathol D 05/05/2017, 1:09 PM

## 2017-05-05 NOTE — Anesthesia Procedure Notes (Signed)
Procedure Name: Intubation Date/Time: 05/05/2017 1:41 PM Performed by: Lance Coon Pre-anesthesia Checklist: Patient identified, Suction available, Emergency Drugs available, Timeout performed and Patient being monitored Patient Re-evaluated:Patient Re-evaluated prior to induction Oxygen Delivery Method: Circle system utilized Preoxygenation: Pre-oxygenation with 100% oxygen Induction Type: IV induction Ventilation: Mask ventilation without difficulty Laryngoscope Size: Miller and 3 Grade View: Grade I Tube type: Oral Tube size: 7.5 mm Number of attempts: 1 Airway Equipment and Method: Stylet Placement Confirmation: ETT inserted through vocal cords under direct vision,  positive ETCO2 and breath sounds checked- equal and bilateral Secured at: 22 cm Tube secured with: Tape Dental Injury: Teeth and Oropharynx as per pre-operative assessment

## 2017-05-05 NOTE — Progress Notes (Signed)
Pt going down to OR for procedure; report called off to RN Stacy in short stay. Pt transported off unit via bed with IV intact and transfusing. Spouse at bedside. Delia Heady RN

## 2017-05-05 NOTE — Anesthesia Preprocedure Evaluation (Addendum)
Anesthesia Evaluation  Patient identified by MRN, date of birth, ID band Patient awake    Reviewed: Allergy & Precautions, H&P , NPO status , Patient's Chart, lab work & pertinent test results, reviewed documented beta blocker date and time   Airway Mallampati: II  TM Distance: >3 FB Neck ROM: full    Dental no notable dental hx. (+) Teeth Intact, Dental Advisory Given   Pulmonary shortness of breath and with exertion, sleep apnea and Continuous Positive Airway Pressure Ventilation , pneumonia, COPD,  COPD inhaler, former smoker,    Pulmonary exam normal breath sounds clear to auscultation       Cardiovascular hypertension, Pt. on medications and Pt. on home beta blockers  Rhythm:regular Rate:Normal     Neuro/Psych PSYCHIATRIC DISORDERS Depression    GI/Hepatic Neg liver ROS, GERD  ,  Endo/Other  Morbid obesity  Renal/GU negative Renal ROS     Musculoskeletal  (+) Arthritis , Rheumatoid disorders,    Abdominal   Peds  Hematology negative hematology ROS (+)   Anesthesia Other Findings   Reproductive/Obstetrics                            Anesthesia Physical  Anesthesia Plan  ASA: III  Anesthesia Plan: General   Post-op Pain Management:    Induction: Intravenous  PONV Risk Score and Plan: 3 and Ondansetron, Dexamethasone and Scopolamine patch - Pre-op  Airway Management Planned: Oral ETT  Additional Equipment:   Intra-op Plan:   Post-operative Plan: Extubation in OR  Informed Consent: I have reviewed the patients History and Physical, chart, labs and discussed the procedure including the risks, benefits and alternatives for the proposed anesthesia with the patient or authorized representative who has indicated his/her understanding and acceptance.   Dental Advisory Given  Plan Discussed with: CRNA and Surgeon  Anesthesia Plan Comments: ( )        Anesthesia Quick  Evaluation

## 2017-05-06 ENCOUNTER — Encounter (HOSPITAL_COMMUNITY): Payer: Self-pay | Admitting: Neurosurgery

## 2017-05-06 DIAGNOSIS — T847XXA Infection and inflammatory reaction due to other internal orthopedic prosthetic devices, implants and grafts, initial encounter: Secondary | ICD-10-CM

## 2017-05-06 DIAGNOSIS — M2578 Osteophyte, vertebrae: Secondary | ICD-10-CM

## 2017-05-06 DIAGNOSIS — M0579 Rheumatoid arthritis with rheumatoid factor of multiple sites without organ or systems involvement: Secondary | ICD-10-CM

## 2017-05-06 DIAGNOSIS — T148XXA Other injury of unspecified body region, initial encounter: Secondary | ICD-10-CM

## 2017-05-06 DIAGNOSIS — G972 Intracranial hypotension following ventricular shunting: Secondary | ICD-10-CM

## 2017-05-06 DIAGNOSIS — M4620 Osteomyelitis of vertebra, site unspecified: Secondary | ICD-10-CM

## 2017-05-06 DIAGNOSIS — A498 Other bacterial infections of unspecified site: Secondary | ICD-10-CM

## 2017-05-06 LAB — BASIC METABOLIC PANEL
Anion gap: 7 (ref 5–15)
BUN: 14 mg/dL (ref 6–20)
CHLORIDE: 98 mmol/L — AB (ref 101–111)
CO2: 29 mmol/L (ref 22–32)
CREATININE: 0.9 mg/dL (ref 0.61–1.24)
Calcium: 9.6 mg/dL (ref 8.9–10.3)
GFR calc non Af Amer: >60 mL/min (ref >60)
GLUCOSE: 157 mg/dL — AB (ref 65–99)
Potassium: 4.1 mmol/L (ref 3.5–5.1)
Sodium: 134 mmol/L — ABNORMAL LOW (ref 135–145)

## 2017-05-06 MED ORDER — CEFTAZIDIME 2 G IJ SOLR
2.0000 g | Freq: Three times a day (TID) | INTRAMUSCULAR | Status: DC
  Administered 2017-05-06 – 2017-05-07 (×3): 2 g via INTRAVENOUS
  Filled 2017-05-06 (×4): qty 2

## 2017-05-06 MED ORDER — DEXTROSE 5 % IV SOLN
2.0000 g | INTRAVENOUS | Status: AC
  Administered 2017-05-06: 2 g via INTRAVENOUS
  Filled 2017-05-06: qty 2

## 2017-05-06 NOTE — Care Management Note (Signed)
Case Management Note  Patient Details  Name: [name] MRN: [id number] Date of Birth: [birth year]  Subjective/Objective:                    Action/Plan: Plan is home with PO or IV antibiotics. CM met with the patient and his wife and inquired about [facility] RN services if he goes home with IV antibiotics. They asked to see if the nursing staff at [facility] would be able to provide these services. CM called [facility] and they are not able to provide RN services for IV antibiotics to the independent residents.  The patient was given a choice of [place] agencies and he selected [facility]. Pam with [facility] IV therapy notified.  CM following.  Expected Discharge Date:                  Expected Discharge Plan:  [place]  In-House Referral:     Discharge planning Services  CM Consult  Post Acute Care Choice:  Home Health Choice offered to:     DME Arranged:    DME Agency:     HH Arranged:  RN, IV Antibiotics HH Agency:  [facility]  Status of Service:  In process, will continue to follow  If discussed at Long Length of Stay Meetings, dates discussed:    Additional Comments:  Pollie Friar, RN 05/06/2017, 5:16 PM

## 2017-05-06 NOTE — Progress Notes (Signed)
Spoke with Dr Tommy Medal, hold on for PICC placement today until he is able to speak with surgeon.

## 2017-05-06 NOTE — Consult Note (Signed)
[facility] for Infectious Disease   Reason for Consult: Surgical wound infection  Referring Physician: Dr. [name]  Recommendations:   - Transition to ceftazidime from cefepime to narrow coverage for pseudomonas. There is high suspicion that this is same organism from 7/23 culture, but we will follow cultures and adjust as necessary. - Discontinue vancomycin - Patient should not receive Remicade injections while he is being treated for this infection. He can continue home prednisone, plaquenil and methotrexate. - Patient will need at least 6 weeks of antibiotics to treat this infection. Patient will likely need protracted course with 6-12 months given presence of instrumentation in close proximity to wound.  - Considering oral therapy with ciprofloxacin on discharge given high oral bioavailability and sufficient CNS penetration. Ceftazidime with PICC line placement would be another option.    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 Wound culture taken on 7/23 grew pseudomonas that was pan-sensitive. Patient has leukocytosis and elevated inflammatory markers, however rheumatoid arthritis and corticosteroids may be contributory. Wound culture from 8/7 growing rare pseudomonas aeruginosa, sensitives are pending.   This patient will need to be treated for this deep soft-tissue infection with antibiotics for at least 6 weeks. Patient can deescalate to ceftazidime given sensitives from 7/23 as this is likely the same organism. Choice of antibiotic will need good CNS penetration given the possibility of CSF leak and infection seeding to his CNS. Patient may be able to be discharged with oral ciprofloxacin given its high oral bioavailability and good CNS  penetration. Alternative would be having PICC line placed and continuing ceftazidime. He will likely need long-term therapy of at least 6-12 months given instrumentation in close proximity to the wound.   Given chronic prednisone administration, Remicaid and methotrexate therapy, and asplenia, patient has significant immunosuppression/deficiency. This raises concern for rapid deterioration should infection persist. Consider low threshold to broaden antibiotic coverage should patient's condition acutely worsen.  Thank you for this consult, we will offer further recommendations regarding antibiotic selection and duration of therapy. Please let us know if you have questions.  Antibiotics: Cefepime (8/8 - ) Vancomycin (8/7 - )  Ceftriaxone (8/7-8/8) Ciprofloxacin (8/7-8/7)  Active Problems:   Wound drainage   . docusate sodium  100 mg Oral BID  . finasteride  5 mg Oral QPM  . fluticasone furoate-vilanterol  1 puff Inhalation Daily  . folic acid  1 mg Oral Daily  . gabapentin  600 mg Oral TID  . hydroxychloroquine  200 mg Oral BID  . lisinopril  20 mg Oral Daily  . metoprolol succinate  50 mg Oral QPM  . pantoprazole  80 mg Oral Daily  . predniSONE  5 mg Oral Q breakfast  . senna-docusate  2 tablet Oral QPM  . simvastatin  40 mg Oral q1800  . sodium chloride flush  3 mL Intravenous Q12H  . tamsulosin  0.4 mg Oral Daily  . tiotropium  1 capsule Inhalation Daily     HPI: [name] is a 76 y.o. male with a history of rheumatoid arthritis and COPD, s/p bilateral hip arthroplasty (2017) and lumbar fusion surgery with instrumentation (02/17/17) who presented on with dark watery drainage from surgical wound.   The patient was found with a CSF leak after the initial operation which was repaired at that time.  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 Wound cultures were drawn at that time and grew pseudomonas aeruginosa that was  pan-sensitive, however the patient was discharged the following day without antibiotics. Patient did report being started on ciprofloxacin on 8/1 to treat symptomatic UTI found at urology appointment. He reports having continued fluid leak and edema surround wound site that is watery with perhaps a small amount of blood, for which he presented here on 8/6. Denies surrounding erythema or crusting, purulent discharge. There was concern of underlying infection given WBC of 14.5, CRP 9.1, and ESR 113. Had I&D yesterday and wound and blood cultures were obtained.   This 76 yo male with rheumatoid arthritis and COPD and lumbar fusion surgery presents with dark watery drainage from surgical wound concerning for deep soft-tissue infection and abscess vs psuedomeningocele. Wound culture taken on 7/23 grew pseudomonas that was pan-sensitive. Patient was discharged before cultures resulted and was not started on abx due to low index of suspicion for infection. However he had been started ciprofloxacin on 8/1 for pseudomonal UTI. There was concern for underlying infection given leukocytosis and elevated inflammatory markers.  Culture from drainage on 8/7 grew rare pseudomonas, blood cultures on intake show no growth at 3 days x2. Patient was started on vancomycin and cefepime. I&D yesterday performed, new blood cultures and wound cultures were obtained. Patient complains of mild headache but denies neck pain, chills, malaise,  dyspnea, chest pain, abdominal pain.    Review of Systems:  All other systems reviewed and are negative except as above   Past Medical History:  Diagnosis Date  . Chronic lower back pain   . Complication of anesthesia 01/2017   was in ICU due to breathing complications-  . COPD (chronic obstructive pulmonary disease) ([place])   . Depression   . Dyspnea    on exertion  . GERD (gastroesophageal reflux disease)   . H/O seasonal allergies   . High cholesterol   . 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 History of kidney stones   . Hypertension   . OSA on CPAP   . Pneumonia    "I've had it several times; last time was end of Jan 2018" (05/02/2017)  . Prostate cancer ([place])   . Rheumatoid arteritis    "all over" (05/02/2017)    Social History  Substance Use Topics  . Smoking status: Former Smoker    Packs/day: 1.50    Years: 54.00    Types: Cigarettes    Quit date: 09/27/2014  . Smokeless tobacco: Never Used     Comment: 05/02/2017 "quit vaping 09/2016"  . Alcohol use 10.2 oz/week    7 Cans of beer, 10 Shots of liquor per week    Family History  Problem Relation Age of Onset  . Cancer Mother   . Heart disease Father     Allergies  Allergen Reactions  . Sulfa Antibiotics Shortness Of Breath  . Varenicline Other (See Comments)    Strange thoughts    Physical Exam: Constitutional: lying in bed in no acute distress, conversant, pleasant Vitals:   05/06/17 0516 05/06/17 0941  BP: 109/73 121/62  Pulse: 82 86  Resp: 20 20  Temp: 97.8 F (36.6 C) 97.6 F (36.4 C)  SpO2: 93% 94%   EYES: anicteric, conjunctivae clear ENMT: no oral lesions or exudates, MMM, neck supple, normal ROM, no LAD Cardiovascular: RRR, normal S1 and S2, no murmurs, rubs,  or gallops Respiratory: lungs clear to auscultation bilaterally. Normal work of breathing GI: Soft, non-distended, non-tender to palpation Musculoskeletal: full ROM in all 4 extremities. Normal bulk and tone.  Skin: lumbar drain intact with serosanguinous drainage, incision looks clean, dry, and intact without surround erythema or crusting. No rash, edema, or erythema. No suspicious lesions. Neuro/Psych: Sensation grossly intact. Alert and oriented x3. Appropriate mood and affect.  Lab Results  Component Value Date   WBC 14.5 (H) 05/02/2017   HGB 12.1 (L) 05/02/2017   HCT 37.2 (L) 05/02/2017   MCV 104.8 (H) 05/02/2017   PLT 402 (H) 05/02/2017    Lab Results  Component Value Date   CREATININE 0.90  05/06/2017   BUN 14 05/06/2017   NA 134 (L) 05/06/2017   K 4.1 05/06/2017   CL 98 (L) 05/06/2017   CO2 29 05/06/2017   No results found for: ALT, AST, GGT, ALKPHOS   Microbiology: Recent Results (from the past 240 hour(s))  Culture, blood (Routine X 2) w Reflex to ID Panel     Status: None (Preliminary result)   Collection Time: 05/02/17  8:31 PM  Result Value Ref Range Status   Specimen Description BLOOD LEFT ANTECUBITAL  Final   Special Requests   Final    BOTTLES DRAWN AEROBIC AND ANAEROBIC Blood Culture adequate volume   Culture NO GROWTH 2 DAYS  Final   Report Status PENDING  Incomplete  Culture, blood (Routine X 2) w Reflex to ID Panel     Status: None (Preliminary result)   Collection Time: 05/02/17  8:34 PM  Result Value Ref Range Status   Specimen Description BLOOD RIGHT ANTECUBITAL  Final   Special Requests   Final    BOTTLES DRAWN AEROBIC AND ANAEROBIC Blood Culture adequate volume   Culture NO GROWTH 2 DAYS  Final   Report Status PENDING  Incomplete  Aerobic Culture (superficial specimen)     Status: None (Preliminary result)   Collection Time: 05/03/17  2:37 PM  Result Value Ref Range Status   Specimen Description WOUND  Final   Special Requests LUMBAR BACK DRAINAGE  Final   Gram Stain   Final    MODERATE WBC PRESENT, PREDOMINANTLY PMN NO SQUAMOUS EPITHELIAL CELLS SEEN NO ORGANISMS SEEN    Culture CULTURE REINCUBATED FOR BETTER GROWTH  Final   Report Status PENDING  Incomplete  Surgical pcr screen     Status: None   Collection Time: 05/05/17 12:39 PM  Result Value Ref Range Status   MRSA, PCR NEGATIVE NEGATIVE Final   Staphylococcus aureus NEGATIVE NEGATIVE Final    Comment:        The Xpert SA Assay (FDA approved for NASAL specimens in patients over 39 years of age), is one component of a comprehensive surveillance program.  Test performance has been validated by [facility] for patients greater than or equal to 62 year old. It is not intended to  diagnose infection nor to guide or monitor treatment.   Aerobic/Anaerobic Culture (surgical/deep wound)     Status: None (Preliminary result)   Collection Time: 05/05/17  2:04 PM  Result Value Ref Range Status   Specimen Description WOUND BACK  Final   Special Requests NONE  Final   Gram Stain   Final    RARE WBC PRESENT, PREDOMINANTLY PMN NO ORGANISMS SEEN    Culture PENDING  Incomplete   Report Status PENDING  Incomplete    Will Honi Name, MS4 05/06/2017, 12:07 PM

## 2017-05-06 NOTE — Progress Notes (Signed)
Debroh Sieloff, RN did offer patient a suppository to aid in patient having bowel movement. Patient stated, "not right now." RN did educate patient regarding the necessity of ensuring bowels move post-procedure and with administration of pain medicine.

## 2017-05-06 NOTE — Evaluation (Signed)
Physical Therapy Evaluation Patient Details Name: [name] MRN: [id number] DOB: 1941/06/04 Today's Date: 05/06/2017   History of Present Illness  76 y.o. male admitted on 05/02/17 for I and D of his lumbar wound (infection) with hemovac placement post op.  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    Clinical Impression  Pt did well with mobility today reporting moderate soreness in his low back.  He, at baseline, uses a SPC, but we used RW today for his first time up.  I would like to progress him back to a cane if able prior to d/c.   PT to follow acutely for deficits listed below.       Follow Up Recommendations Outpatient PT;Other (comment) (Resume PT at [facility] )    Equipment Recommendations  None recommended by PT    Recommendations for Other Services   NA    Precautions / Restrictions Precautions Precautions: Back Precaution Comments: Pt able to demonstrate correct log roll technique to get to EOB and has had multiple lumbar surgeries.       Mobility  Bed Mobility Overal bed mobility: Modified Independent Bed Mobility: Rolling;Sidelying to Sit Rolling: Modified independent (Device/Increase time) Sidelying to sit: Modified independent (Device/Increase time)          Transfers Overall transfer level: Needs assistance Equipment used: Rolling walker (2 wheeled) Transfers: Sit to/from Stand Sit to Stand: Supervision         General transfer comment: supervision for safety  Ambulation/Gait Ambulation/Gait assistance: Supervision Ambulation Distance (Feet): 200 Feet Assistive device: Rolling walker (2 wheeled) Gait Pattern/deviations: Step-through pattern;Antalgic Gait velocity: Decreased Gait velocity interpretation: Below normal speed for age/gender General Gait Details: Mildly antalgic gait pattern reporting bil arch pain.  Pt has not been up OOB much over  the past 4 days per pt report.  He was active at [facility] with their thearpists there.  Encouraged pt to walk TID with wife.          Balance Overall balance assessment: Needs assistance Sitting-balance support: Feet supported;No upper extremity supported Sitting balance-Leahy Scale: Good     Standing balance support: Bilateral upper extremity supported;No upper extremity supported;Single extremity supported Standing balance-Leahy Scale: Fair                               Pertinent Vitals/Pain Pain Assessment: 0-10 Pain Score: 4  Pain Location: surgical site pain Pain Descriptors / Indicators: Sore;Operative site guarding Pain Intervention(s): Limited activity within patient's tolerance;Monitored during session;Repositioned    Home Living Family/patient expects to be discharged to:: Private residence Living Arrangements: Spouse/significant other Available Help at Discharge: Family;Available 24 hours/day Type of Home: Independent living facility Home Access: Level entry     Home Layout: One level Home Equipment: Cane - single point;Walker - 2 wheels;Grab bars - tub/shower;Shower seat;Adaptive equipment Additional Comments: Pt lives at [facility]     Prior Function     Gait / Transfers Assistance Needed: Uses cane with ambulation   ADL's / Homemaking Assistance Needed: Wife needs to help with compression socks         Hand Dominance   Dominant Hand: Right    Extremity/Trunk Assessment   Upper Extremity Assessment Upper Extremity Assessment: Overall WFL for tasks assessed    Lower Extremity Assessment Lower Extremity Assessment: Generalized weakness    Cervical / Trunk  Assessment Cervical / Trunk Assessment: Other exceptions Cervical / Trunk Exceptions: s/p multiple back surgeries  Communication   Communication: No difficulties  Cognition Arousal/Alertness: Awake/alert Behavior During Therapy: WFL for tasks assessed/performed Overall  Cognitive Status: Within Functional Limits for tasks assessed                                               Assessment/Plan    PT Assessment Patient needs continued PT services  PT Problem List Decreased strength;Decreased activity tolerance;Decreased mobility;Decreased balance;Decreased knowledge of use of DME;Decreased knowledge of precautions;Pain       PT Treatment Interventions DME instruction;Gait training;Functional mobility training;Therapeutic activities;Therapeutic exercise;Balance training;Neuromuscular re-education;Patient/family education;Modalities    PT Goals (Current goals can be found in the Care Plan section)  Acute Rehab PT Goals Patient Stated Goal: to get back to traveling again PT Goal Formulation: With patient Time For Goal Achievement: 05/13/17 Potential to Achieve Goals: Good    Frequency Min 5X/week           AM-PAC PT "6 Clicks" Daily Activity  Outcome Measure Difficulty turning over in bed (including adjusting bedclothes, sheets and blankets)?: None Difficulty moving from lying on back to sitting on the side of the bed? : None Difficulty sitting down on and standing up from a chair with arms (e.g., wheelchair, bedside commode, etc,.)?: None Help needed moving to and from a bed to chair (including a wheelchair)?: None Help needed walking in hospital room?: None Help needed climbing 3-5 steps with a railing? : A Little 6 Click Score: 23    End of Session Equipment Utilized During Treatment: Gait belt Activity Tolerance: Patient limited by fatigue;Patient limited by pain Patient left: in chair;with call bell/phone within reach;with family/visitor present   PT Visit Diagnosis: Pain;Other abnormalities of gait and mobility (R26.89) Pain - Right/Left:  (lower) Pain - part of body:  (back)    Time: 3254-9826 PT Time Calculation (min) (ACUTE ONLY): 28 min   Charges:        Wells Guiles B. Cliford Sequeira, PT, DPT [phone]   PT  Evaluation $PT Eval Moderate Complexity: 1 Mod PT Treatments $Gait Training: 8-22 mins   05/06/2017, 3:41 PM

## 2017-05-06 NOTE — Progress Notes (Addendum)
Pharmacy Antibiotic Note  [name] is a 76 y.o. male admitted on 05/02/2017 with dark watery drainage from surgical wound. He is s/p bilateral hip arthroplasty (2017) and lumbar fusion surgery with instrumentation (02/17/17).  He was started on IV vancomycin and Rocephin, then Rocephin switched to Cefepime on 05/04/17.  ID consulted and discontinued Vanc and Cefepime today 05/06/17. Pharmacy has been consulted for Ceftazidime dosing for Osteomyelitis. 8 /7 lumbar back drainage Cx: rare pseudomonas  Plan: Ceftazidime 2g IV q8h start now.  Monitor daily clinical status, renal function, cultures.   Height: _0  (172.7 cm) Weight: 230 lb (104.3 kg) IBW/kg (Calculated) : 68.4  Temp (24hrs), Avg:97.9 F (36.6 C), Min:97.6 F (36.4 C), Max:98.4 F (36.9 C)   Recent Labs Lab 05/02/17 2031 05/06/17 0353  WBC 14.5*  --   CREATININE 0.91 0.90    Estimated Creatinine Clearance: 84.3 mL/min (by C-G formula based on SCr of 0.9 mg/dL).    Allergies  Allergen Reactions  . Sulfa Antibiotics Shortness Of Breath  . Varenicline Other (See Comments)    Strange thoughts    Antimicrobials this admission: Ceftazidime 8/10>> Cefepime 8/8 >>8//10 Vancomycin 8/7>>8/10 Rocephin 8/7>>8/8 Cipro PO 8/6>>8/7  Dose adjustments this admission:   Microbiology results: 8/9 wound cx:  8/9 BCx: ngtd <24hr 8/9 MRSA PCR : negative 8/6 Blood x ngtd  8/7 lumbar back drainage Cx: rare pseudomonas 8/7 CSF: not found  Prior admit: 7/23 lumbar wound - rare Pseudomonas, sens Cefepime, Fortaz, Cipro, Imi, Zosyn, Gent < 1   Thank you for allowing pharmacy to be a part of this patient's care. Nicole Cella, RPh Clinical Pharmacist Pager: [phone]-4P [phone]-[phone] [phone] [place] [phone] 05/06/2017 1:34 PM

## 2017-05-06 NOTE — Progress Notes (Signed)
[place] pt for [facility] this hospital admission.  [facility] will provide [facility] services for home IV ABX upon DC to home.  [facility] is prepared for weekend DC but will need script if IVABX are needed.  If patient discharges after hours, please call [phone].   Larry Sierras 05/06/2017, 5:17 PM

## 2017-05-06 NOTE — Progress Notes (Signed)
[name], male   DOB: June 25, 1941, 76 y.o.   MRN: [id number] Subjective:  the patient is alert and pleasant. he looks well. He has no complaints.  Objective: Vital signs in last 24 hours: Temp:  [97.6 F (36.4 C)-98.8 F (37.1 C)] 97.8 F (36.6 C) (08/10 0516) Pulse Rate:  [58-102] 82 (08/10 0516) Resp:  [14-20] 20 (08/10 0516) BP: (109-145)/(68-83) 109/73 (08/10 0516) SpO2:  [93 %-97 %] 93 % (08/10 0516) Weight:  [104.3 kg (230 lb)] 104.3 kg (230 lb) (08/09 1300)  Intake/Output from previous day: 08/09 0701 - 08/10 0700 In: 1483.2 [I.V.:483.2; IV Piggyback:1000] Out: 100 [Drains:75; Blood:25] Intake/Output this shift: No intake/output data recorded.  Physical exam he patient is alert and oriented. He is moving his lower extremities well.His drain has put out 75 mL last shift.  Lab Results: No results for input(s): WBC, HGB, HCT, PLT in the last 72 hours. BMET  Recent Labs  05/06/17 0353  NA 134*  K 4.1  CL 98*  CO2 29  GLUCOSE 157*  BUN 14  CREATININE 0.90  CALCIUM 9.6    Studies/Results: No results found.  Assessment/Plan: Postop day #1:Blood cultures and wound cultures are so far negative. Wound cultures from a previous admission demonstrated rare Pseudomonas. We will plan to place a PICC line once the patient's blood cultures are negative in anticipation of home IV antibiotics. I'll ask ID to see the patient to make recommendations on antibiotics and length of therapy. I have answered all the patient's questions.If we can get this arranged  go home this weekend. I'll see him back in about 10 days for suture removal.  LOS: 4 days     Caroleen Stoermer D 05/06/2017, 7:02 AM

## 2017-05-07 DIAGNOSIS — T847XXD Infection and inflammatory reaction due to other internal orthopedic prosthetic devices, implants and grafts, subsequent encounter: Secondary | ICD-10-CM

## 2017-05-07 LAB — AEROBIC CULTURE W GRAM STAIN (SUPERFICIAL SPECIMEN)

## 2017-05-07 LAB — AEROBIC CULTURE  (SUPERFICIAL SPECIMEN)

## 2017-05-07 MED ORDER — CIPROFLOXACIN HCL 500 MG PO TABS
750.0000 mg | ORAL_TABLET | Freq: Two times a day (BID) | ORAL | Status: DC
  Administered 2017-05-07 – 2017-05-08 (×2): 750 mg via ORAL
  Filled 2017-05-07 (×2): qty 1

## 2017-05-07 MED ORDER — FAMOTIDINE 20 MG PO TABS: 40.0000 mg | ORAL_TABLET | Freq: Every day | ORAL | Status: DC | PRN

## 2017-05-07 NOTE — Progress Notes (Signed)
Subjective:  Constipation though he was in bath room after laxative when I saw him   Antibiotics:  Anti-infectives    Start     Dose/Rate Route Frequency Ordered Stop   05/07/17 2000  ciprofloxacin (CIPRO) tablet 750 mg     750 mg Oral 2 times daily 05/07/17 1613     05/06/17 2200  cefTAZidime (FORTAZ) 2 g in dextrose 5 % 50 mL IVPB  Status:  Discontinued     2 g 100 mL/hr over 30 Minutes Intravenous Every 8 hours 05/06/17 1339 05/07/17 1613   05/06/17 1415  cefTAZidime (FORTAZ) 2 g in dextrose 5 % 50 mL IVPB     2 g 100 mL/hr over 30 Minutes Intravenous STAT 05/06/17 1339 05/06/17 1533   05/04/17 1830  ceFEPIme (MAXIPIME) 2 g in dextrose 5 % 50 mL IVPB  Status:  Discontinued     2 g 100 mL/hr over 30 Minutes Intravenous Every 8 hours 05/04/17 1759 05/06/17 1248   05/03/17 1800  vancomycin (VANCOCIN) IVPB 1000 mg/200 mL premix  Status:  Discontinued     1,000 mg 200 mL/hr over 60 Minutes Intravenous Every 12 hours 05/03/17 1734 05/06/17 1248   05/03/17 1800  cefTRIAXone (ROCEPHIN) 2 g in dextrose 5 % 50 mL IVPB  Status:  Discontinued     2 g 100 mL/hr over 30 Minutes Intravenous Every 24 hours 05/03/17 1734 05/04/17 1759   05/02/17 2200  ciprofloxacin (CIPRO) tablet 500 mg  Status:  Discontinued    Comments:  For ten days     500 mg Oral 2 times daily 05/02/17 1932 05/03/17 1707   05/02/17 2200  hydroxychloroquine (PLAQUENIL) tablet 200 mg     200 mg Oral 2 times daily 05/02/17 1932        Medications: Scheduled Meds: . ciprofloxacin  750 mg Oral BID  . docusate sodium  100 mg Oral BID  . finasteride  5 mg Oral QPM  . fluticasone furoate-vilanterol  1 puff Inhalation Daily  . folic acid  1 mg Oral Daily  . gabapentin  600 mg Oral TID  . hydroxychloroquine  200 mg Oral BID  . lisinopril  20 mg Oral Daily  . metoprolol succinate  50 mg Oral QPM  . predniSONE  5 mg Oral Q breakfast  . senna-docusate  2 tablet Oral QPM  . simvastatin  40 mg Oral q1800  .  sodium chloride flush  3 mL Intravenous Q12H  . tamsulosin  0.4 mg Oral Daily  . tiotropium  1 capsule Inhalation Daily   Continuous Infusions: . sodium chloride 250 mL (05/06/17 0300)  . lactated ringers Stopped (05/05/17 2215)   PRN Meds:.acetaminophen **OR** acetaminophen, albuterol, bisacodyl, cyclobenzaprine, famotidine, fluticasone, menthol-cetylpyridinium **OR** phenol, morphine injection, ondansetron **OR** ondansetron (ZOFRAN) IV, oxyCODONE, sodium chloride, sodium chloride flush    Objective: Weight change:   Intake/Output Summary (Last 24 hours) at 05/07/17 1615 Last data filed at 05/07/17 0800  Gross per 24 hour  Intake              920 ml  Output             1640 ml  Net             -720 ml   Blood pressure 140/82, pulse (!) 106, temperature 98.4 F (36.9 C), temperature source Oral, resp. rate 20, height _0  (1.727 m), weight 230 lb (104.3 kg), SpO2 97 %. Temp:  [97.8 F (  36.6 C)-98.7 F (37.1 C)] 98.4 F (36.9 C) (08/11 1450) Pulse Rate:  [73-106] 106 (08/11 1450) Resp:  [18-20] 20 (08/11 1450) BP: (120-140)/(61-82) 140/82 (08/11 1450) SpO2:  [91 %-97 %] 97 % (08/11 1450)  Physical Exam: General: Alert and awake, oriented x3, in the rest room  CBC: CBC Latest Ref Rng & Units 05/02/2017 04/18/2017 02/18/2017  WBC 4.0 - 10.5 K/uL 14.5(H) 12.9(H) 13.0(H)  Hemoglobin 13.0 - 17.0 g/dL 12.1(L) 12.6(L) 12.5(L)  Hematocrit 39.0 - 52.0 % 37.2(L) 38.8(L) 39.0  Platelets 150 - 400 K/uL 402(H) 391 249     BMET  Recent Labs  05/06/17 0353  NA 134*  K 4.1  CL 98*  CO2 29  GLUCOSE 157*  BUN 14  CREATININE 0.90  CALCIUM 9.6     Liver Panel  No results for input(s): PROT, ALBUMIN, AST, ALT, ALKPHOS, BILITOT, BILIDIR, IBILI in the last 72 hours.     Sedimentation Rate No results for input(s): ESRSEDRATE in the last 72 hours. C-Reactive Protein No results for input(s): CRP in the last 72 hours.  Micro Results: Recent Results (from the past 720  hour(s))  Aerobic/Anaerobic Culture (surgical/deep wound)     Status: None   Collection Time: 04/18/17  5:24 PM  Result Value Ref Range Status   Specimen Description WOUND  Final   Special Requests LUMBAR  Final   Gram Stain   Final    FEW WBC PRESENT,BOTH PMN AND MONONUCLEAR NO ORGANISMS SEEN    Culture   Final    RARE PSEUDOMONAS AERUGINOSA NO ANAEROBES ISOLATED    Report Status 04/23/2017 FINAL  Final   Organism ID, Bacteria PSEUDOMONAS AERUGINOSA  Final      Susceptibility   Pseudomonas aeruginosa - MIC*    CEFTAZIDIME 2 SENSITIVE Sensitive     CIPROFLOXACIN <=0.25 SENSITIVE Sensitive     GENTAMICIN <=1 SENSITIVE Sensitive     IMIPENEM 1 SENSITIVE Sensitive     PIP/TAZO <=4 SENSITIVE Sensitive     CEFEPIME 2 SENSITIVE Sensitive     * RARE PSEUDOMONAS AERUGINOSA  Culture, blood (Routine X 2) w Reflex to ID Panel     Status: None (Preliminary result)   Collection Time: 05/02/17  8:31 PM  Result Value Ref Range Status   Specimen Description BLOOD LEFT ANTECUBITAL  Final   Special Requests   Final    BOTTLES DRAWN AEROBIC AND ANAEROBIC Blood Culture adequate volume   Culture NO GROWTH 4 DAYS  Final   Report Status PENDING  Incomplete  Culture, blood (Routine X 2) w Reflex to ID Panel     Status: None (Preliminary result)   Collection Time: 05/02/17  8:34 PM  Result Value Ref Range Status   Specimen Description BLOOD RIGHT ANTECUBITAL  Final   Special Requests   Final    BOTTLES DRAWN AEROBIC AND ANAEROBIC Blood Culture adequate volume   Culture NO GROWTH 4 DAYS  Final   Report Status PENDING  Incomplete  Aerobic Culture (superficial specimen)     Status: None   Collection Time: 05/03/17  2:37 PM  Result Value Ref Range Status   Specimen Description WOUND  Final   Special Requests LUMBAR BACK DRAINAGE  Final   Gram Stain   Final    MODERATE WBC PRESENT, PREDOMINANTLY PMN NO SQUAMOUS EPITHELIAL CELLS SEEN NO ORGANISMS SEEN    Culture RARE PSEUDOMONAS AERUGINOSA   Final   Report Status 05/07/2017 FINAL  Final   Organism ID, Bacteria PSEUDOMONAS AERUGINOSA  Final  Susceptibility   Pseudomonas aeruginosa - MIC*    CEFTAZIDIME 2 SENSITIVE Sensitive     CIPROFLOXACIN <=0.25 SENSITIVE Sensitive     GENTAMICIN <=1 SENSITIVE Sensitive     IMIPENEM 1 SENSITIVE Sensitive     PIP/TAZO <=4 SENSITIVE Sensitive     CEFEPIME 2 SENSITIVE Sensitive     * RARE PSEUDOMONAS AERUGINOSA  Surgical pcr screen     Status: None   Collection Time: 05/05/17 12:39 PM  Result Value Ref Range Status   MRSA, PCR NEGATIVE NEGATIVE Final   Staphylococcus aureus NEGATIVE NEGATIVE Final    Comment:        The Xpert SA Assay (FDA approved for NASAL specimens in patients over 74 years of age), is one component of a comprehensive surveillance program.  Test performance has been validated by [facility] for patients greater than or equal to 69 year old. It is not intended to diagnose infection nor to guide or monitor treatment.   Aerobic/Anaerobic Culture (surgical/deep wound)     Status: None (Preliminary result)   Collection Time: 05/05/17  2:04 PM  Result Value Ref Range Status   Specimen Description WOUND BACK  Final   Special Requests NONE  Final   Gram Stain   Final    RARE WBC PRESENT, PREDOMINANTLY PMN NO ORGANISMS SEEN    Culture   Final    RARE PSEUDOMONAS AERUGINOSA SUSCEPTIBILITIES TO FOLLOW NO ANAEROBES ISOLATED; CULTURE IN PROGRESS FOR 5 DAYS    Report Status PENDING  Incomplete  Culture, blood (routine x 2)     Status: None (Preliminary result)   Collection Time: 05/05/17  4:47 PM  Result Value Ref Range Status   Specimen Description BLOOD LEFT ANTECUBITAL  Final   Special Requests IN PEDIATRIC BOTTLE Blood Culture adequate volume  Final   Culture NO GROWTH 2 DAYS  Final   Report Status PENDING  Incomplete  Culture, blood (routine x 2)     Status: None (Preliminary result)   Collection Time: 05/05/17  4:57 PM  Result Value Ref Range Status    Specimen Description BLOOD LEFT HAND  Final   Special Requests IN PEDIATRIC BOTTLE Blood Culture adequate volume  Final   Culture NO GROWTH 2 DAYS  Final   Report Status PENDING  Incomplete    Studies/Results: No results found.    Assessment/Plan:  INTERVAL HISTORY: Pseudomonas aeruginosa again isolated and again sensitive to ciprofloxacin.   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 male with recent lumbar surgery with hardware placement complicated by CSF leak and deep infection with pseudomonas.  DUE to the hardware being present he needs at LEAST 6 months of therapy. The ONLY drug we can use for oral part of this is cipro  SINCE CIPRO is EXTREMELY BIO-AVAILABLE we can also use it for the SYSTEMIC antibiotic phase of his treatment and avoid the risks of PICC line such as super infection or DVT.  Yesterday the patient and wife and I were weighing pros and cons of the initial systemic treatment with IV antibiotics such as ceftazidime versus a very bioavailable drug a ciprofloxacin  I think it is very rational to proceed with high-dose oral ciprofloxacin and I'm changing him to 750 mg twice daily.  The main  issue with the ciprofloxacin that I worry about his risk for C. difficile colitis but we will be doing with that risk over the next 6 months regardless  To help reduce risk I'm changing his PPI for an H2 blocker  Other things to monitor would be avoiding drugs that prolong the QT interval.  From my standpoint he will be safe to discharge tonight or tomorrow morning. When I made rounds the sensitivities were not yet back.  L arrange for him to be seen in our clinic in the next 4 weeks.  He certainly should not receive potent  biologic therapy such as Remicade while we are treating this infection.  I will sign off for now please call with further questions.   LOS: 5 days   Alcide Evener 05/07/2017, 4:15 PM

## 2017-05-07 NOTE — Progress Notes (Signed)
Physical Therapy Treatment Patient Details Name: DALON REICHART MRN: [id number] DOB: 1941/06/18 Today's Date: 05/07/2017    History of Present Illness 76 y.o. male admitted on 05/02/17 for I and D of his lumbar wound (infection) with hemovac placement post op.  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      PT Comments    Pt declined ambulation with single point cane this session. He reports feeling more secure with the RW at this time.   Follow Up Recommendations  Outpatient PT;Other (comment) (resume PT at [facility])     Equipment Recommendations  None recommended by PT    Recommendations for Other Services       Precautions / Restrictions Precautions Precautions: Back Precaution Comments: Pt independently recalled 3/3 precautions.    Mobility  Bed Mobility Overal bed mobility: Modified Independent                Transfers   Equipment used: Rolling walker (2 wheeled)   Sit to Stand: Supervision         General transfer comment: supervision for safety  Ambulation/Gait Ambulation/Gait assistance: Supervision Ambulation Distance (Feet): 300 Feet Assistive device: Rolling walker (2 wheeled) Gait Pattern/deviations: Step-through pattern;Decreased stride length Gait velocity: Decreased Gait velocity interpretation: Below normal speed for age/gender General Gait Details: Standing rest break x 2.   Stairs            Wheelchair Mobility    Modified Rankin (Stroke Patients Only)       Balance                                            Cognition Arousal/Alertness: Awake/alert Behavior During Therapy: WFL for tasks assessed/performed Overall Cognitive Status: Within Functional Limits for tasks assessed                                        Exercises      General Comments        Pertinent Vitals/Pain Pain  Assessment: 0-10 Pain Score: 3  Pain Location: surgical site pain Pain Descriptors / Indicators: Sore;Operative site guarding Pain Intervention(s): Monitored during session    Home Living                      Prior Function            PT Goals (current goals can now be found in the care plan section) Acute Rehab PT Goals Patient Stated Goal: to get back to traveling again PT Goal Formulation: With patient Time For Goal Achievement: 05/13/17 Potential to Achieve Goals: Good Progress towards PT goals: Progressing toward goals    Frequency    Min 5X/week      PT Plan Current plan remains appropriate    Co-evaluation              AM-PAC PT "6 Clicks" Daily Activity  Outcome Measure  Difficulty turning over in bed (including adjusting bedclothes, sheets and blankets)?: None Difficulty moving from lying on back to sitting on the side of the bed? : None Difficulty sitting down on and standing up from a chair with arms (e.g., wheelchair, bedside commode, etc,.)?: None Help needed  moving to and from a bed to chair (including a wheelchair)?: None Help needed walking in hospital room?: None Help needed climbing 3-5 steps with a railing? : A Little 6 Click Score: 23    End of Session Equipment Utilized During Treatment: Gait belt Activity Tolerance: Patient tolerated treatment well Patient left: in bed;with call bell/phone within reach;with family/visitor present Nurse Communication: Mobility status PT Visit Diagnosis: Pain;Other abnormalities of gait and mobility (R26.89)     Time: 8099-8338 PT Time Calculation (min) (ACUTE ONLY): 16 min  Charges:  $Gait Training: 8-22 mins                    G Codes:       Lorrin Goodell, PT  Office # [phone] Pager [phone]    Lorriane Shire 05/07/2017, 3:08 PM

## 2017-05-07 NOTE — Progress Notes (Signed)
Patient was already on CPAP when RT came for rounds. Patient tolerating well and RT will continue to monitor as needed.

## 2017-05-07 NOTE — Progress Notes (Signed)
Vitals:   05/07/17 0120 05/07/17 0446 05/07/17 0858 05/07/17 0900  BP: 123/80 120/61    Pulse: 81 73    Resp: 20 20    Temp: 98.1 F (36.7 C) 97.8 F (36.6 C)    TempSrc: Oral Oral    SpO2: 93% 95% 91% 92%  Weight:      Height:        BMET  Recent Labs  05/06/17 0353  NA 134*  K 4.1  CL 98*  CO2 29  GLUCOSE 157*  BUN 14  CREATININE 0.90  CALCIUM 9.6    Patient resting in bed, comfortable. JP drain still in place for days after surgery, only 40 mL of drainage through past 24 hours, only 25 mL through preceding 24 hours. Original dressing from OR still in place.  Wound dressing changed, Jackson-Pratt drain removed. Wound healing nicely. No erythema, swelling, or drainage.  PICC ordered yesterday by Dr. [name], not yet placed.  Seen by infectious disease in consultation yesterday. Final recommendations pending further culture results.  Plan: Patient encouraged to ambulate at least 5 times per day in the hallways, using the rolling walker. Continues on IV antibiotics per recommendations of infectious disease.  Spoke with the patient and his wife regarding our plans for treatment and care.  Hosie Spangle, MD 05/07/2017, 9:15 AM

## 2017-05-08 LAB — CULTURE, BLOOD (ROUTINE X 2)
CULTURE: NO GROWTH
Culture: NO GROWTH
SPECIAL REQUESTS: ADEQUATE
Special Requests: ADEQUATE

## 2017-05-08 MED ORDER — OXYCODONE HCL 5 MG PO TABS: 5.0000 mg | ORAL_TABLET | ORAL | 0 refills | Status: DC | PRN

## 2017-05-08 MED ORDER — FAMOTIDINE 40 MG PO TABS: 40.0000 mg | ORAL_TABLET | Freq: Every day | ORAL | 6 refills | Status: DC

## 2017-05-08 MED ORDER — CIPROFLOXACIN HCL 750 MG PO TABS: 750.0000 mg | ORAL_TABLET | Freq: Two times a day (BID) | ORAL | 6 refills | Status: DC

## 2017-05-08 NOTE — Progress Notes (Signed)
D/c reviewed with patient and spouse. Encouraged to adhere and complete antibiotic order. No further questions at this time

## 2017-05-08 NOTE — Discharge Summary (Signed)
Physician Discharge Summary  [name] MRN: [id number] DOB/AGE: Feb 19, 1941 76 y.o.  Admit date: 05/02/2017 Discharge date: 05/08/2017  Admission Diagnoses: surgical site wound infection  Discharge Diagnoses:surgical site wound infection rheumatoid  Arthritis pseudomonas aeruginosa infection  Active Problems:   Wound drainage   Hardware complicating wound infection ([place])   Vertebral osteophyte   Vertebral osteomyelitis ([place])   Pseudomonas aeruginosa infection   Rheumatoid arthritis involving multiple sites with positive rheumatoid factor ([place])   Discharged Condition: fair  Hospital Course: patient was admitted to undergdrainage and debridement of anonhealing wound area he tolerated surgery well. His found to  He started onCipro 750 mg by mouth twice a day.  Consults: ID  Significant Diagnostic Studies: none  Treatments: surgery: surgical debridet of lumbar wound infection  Discharge Exam: Blood pressure 133/71, pulse 91, temperature 98.7 F (37.1 C), temperature source Oral, resp. rate 20, height _0  (1.727 m), weight 104.3 kg (230 lb), SpO2 94 %. incision is clean and drying ambulatory.  Disposition: discharge home  Discharge Instructions    Call MD for:  redness, tenderness, or signs of infection (pain, swelling, redness, odor or green/yellow discharge around incision site)    Complete by:  As directed    Call MD for:  severe uncontrolled pain    Complete by:  As directed    Call MD for:  temperature >100.4    Complete by:  As directed    Diet - low sodium heart healthy    Complete by:  As directed    Discharge instructions    Complete by:  As directed    Change dressing daily with dry gauze. Patent incision with Betadine.Okay to shower between dressing changes.   Increase activity slowly    Complete by:  As directed      Allergies as of 05/08/2017      Reactions   Sulfa Antibiotics Shortness Of Breath   Varenicline Other (See Comments)    Strange thoughts      Medication List    STOP taking these medications   omeprazole 40 MG capsule Commonly known as:  PRILOSEC   REMICADE IV     TAKE these medications   albuterol 108 (90 Base) MCG/ACT inhaler Commonly known as:  PROVENTIL HFA;VENTOLIN HFA Inhale 2 puffs into the lungs every 6 (six) hours as needed for wheezing or shortness of breath.   ciprofloxacin 750 MG tablet Commonly known as:  CIPRO Take 1 tablet (750 mg total) by mouth 2 (two) times daily. What changed:  medication strength  how much to take  when to take this  additional instructions   cyclobenzaprine 10 MG tablet Commonly known as:  FLEXERIL Take 1 tablet (10 mg total) by mouth 3 (three) times daily as needed for muscle spasms.   docusate sodium 100 MG capsule Commonly known as:  COLACE Take 1 capsule (100 mg total) by mouth 2 (two) times daily.   DULoxetine 20 MG capsule Commonly known as:  CYMBALTA Take 20 mg by mouth daily as needed. Will take in the mornings if needed   DULoxetine 60 MG capsule Commonly known as:  CYMBALTA Take 60 mg by mouth every evening.   famotidine 40 MG tablet Commonly known as:  PEPCID Take 1 tablet (40 mg total) by mouth daily.   finasteride 5 MG tablet Commonly known as:  PROSCAR Take 5 mg by mouth every evening.   fluticasone 50 MCG/ACT nasal spray Commonly known as:  FLONASE Place 2 sprays into  both nostrils daily as needed for allergies.   fluticasone furoate-vilanterol 100-25 MCG/INH Aepb Commonly known as:  BREO ELLIPTA Inhale 1 puff into the lungs daily.   folic acid 1 MG tablet Commonly known as:  FOLVITE Take 1 mg by mouth daily.   gabapentin 300 MG capsule Commonly known as:  NEURONTIN Take 600 mg by mouth 3 (three) times daily.   hydroxychloroquine 200 MG tablet Commonly known as:  PLAQUENIL Take 200 mg by mouth 2 (two) times daily.   lisinopril 20 MG tablet Commonly known as:  PRINIVIL,ZESTRIL Take 20 mg by mouth daily.    methotrexate 2.5 MG tablet Take 25 mg by mouth once a week. Saturday   metoprolol succinate 50 MG 24 hr tablet Commonly known as:  TOPROL-XL Take 50 mg by mouth every evening. Take with or immediately following a meal.   oxyCODONE 5 MG immediate release tablet Commonly known as:  Oxy IR/ROXICODONE Take 1-2 tablets (5-10 mg total) by mouth every 3 (three) hours as needed for breakthrough pain. What changed:  Another medication with the same name was added. Make sure you understand how and when to take each.   oxyCODONE 5 MG immediate release tablet Commonly known as:  Oxy IR/ROXICODONE Take 1-2 tablets (5-10 mg total) by mouth every 3 (three) hours as needed for breakthrough pain. What changed:  You were already taking a medication with the same name, and this prescription was added. Make sure you understand how and when to take each.   predniSONE 5 MG tablet Commonly known as:  DELTASONE Take 5 mg by mouth daily with breakfast.   sennosides-docusate sodium 8.6-50 MG tablet Commonly known as:  SENOKOT-S Take 2 tablets by mouth every evening.   simvastatin 40 MG tablet Commonly known as:  ZOCOR Take 40 mg by mouth daily at 6 PM.   sodium chloride 0.65 % Soln nasal spray Commonly known as:  OCEAN Place 1 spray into both nostrils as needed for congestion.   tamsulosin 0.4 MG Caps capsule Commonly known as:  FLOMAX Take 0.4 mg by mouth daily.   Tiotropium Bromide Monohydrate 2.5 MCG/ACT Aers Commonly known as:  SPIRIVA RESPIMAT Inhale 2 puffs into the lungs daily.   traMADol 50 MG tablet Commonly known as:  ULTRAM Take 50-100 mg by mouth every 12 (twelve) hours as needed for severe pain.   traZODone 50 MG tablet Commonly known as:  DESYREL Take 50 mg by mouth at bedtime.        SignedEarleen Newport 05/08/2017, 9:25 AM

## 2017-05-10 LAB — AEROBIC/ANAEROBIC CULTURE W GRAM STAIN (SURGICAL/DEEP WOUND)

## 2017-05-10 LAB — CULTURE, BLOOD (ROUTINE X 2)
CULTURE: NO GROWTH
CULTURE: NO GROWTH
SPECIAL REQUESTS: ADEQUATE
Special Requests: ADEQUATE

## 2017-05-10 LAB — AEROBIC/ANAEROBIC CULTURE (SURGICAL/DEEP WOUND)

## 2017-05-12 ENCOUNTER — Telehealth: Payer: Self-pay | Admitting: Pulmonary Disease

## 2017-05-13 NOTE — Telephone Encounter (Signed)
Pt has been made aware that [employer] is the name of his supplier. I have provided pt with [employer]'s phone number as well.  Nothing further needed.

## 2017-05-20 ENCOUNTER — Telehealth: Payer: Self-pay | Admitting: Pulmonary Disease

## 2017-05-20 NOTE — Telephone Encounter (Signed)
Called and spoke with Ivin Booty and she is aware of the OV note from 2/21 that has been faxed to her at the fax number given. Nothing further is needed.

## 2017-05-23 ENCOUNTER — Ambulatory Visit: Payer: Medicare Other | Admitting: Pulmonary Disease

## 2017-06-02 ENCOUNTER — Telehealth: Payer: Self-pay | Admitting: *Deleted

## 2017-06-02 DIAGNOSIS — IMO0001 Reserved for inherently not codable concepts without codable children: Secondary | ICD-10-CM

## 2017-06-02 DIAGNOSIS — T814XXA Infection following a procedure, initial encounter: Principal | ICD-10-CM

## 2017-06-02 NOTE — Telephone Encounter (Signed)
Patient called to request lab appointment "just to give Dr Tommy Medal more information" for upcoming appointment Tuesday. OK per Dr Tommy Medal.  Verbal order obtained for ESR, CRP, CMP. Landis Gandy, RN

## 2017-06-03 ENCOUNTER — Other Ambulatory Visit: Payer: Medicare Other

## 2017-06-03 DIAGNOSIS — IMO0001 Reserved for inherently not codable concepts without codable children: Secondary | ICD-10-CM

## 2017-06-03 DIAGNOSIS — T814XXA Infection following a procedure, initial encounter: Principal | ICD-10-CM

## 2017-06-04 LAB — COMPLETE METABOLIC PANEL WITH GFR
AG Ratio: 1 (calc) (ref 1.0–2.5)
ALT: 7 U/L — AB (ref 9–46)
AST: 16 U/L (ref 10–35)
Albumin: 3.2 g/dL — ABNORMAL LOW (ref 3.6–5.1)
Alkaline phosphatase (APISO): 60 U/L (ref 40–115)
BILIRUBIN TOTAL: 0.4 mg/dL (ref 0.2–1.2)
BUN: 13 mg/dL (ref 7–25)
CALCIUM: 9.2 mg/dL (ref 8.6–10.3)
CHLORIDE: 102 mmol/L (ref 98–110)
CO2: 28 mmol/L (ref 20–32)
CREATININE: 0.95 mg/dL (ref 0.70–1.18)
GFR, EST AFRICAN AMERICAN: 91 mL/min/{1.73_m2} (ref >=60)
GFR, Est Non African American: 79 mL/min/{1.73_m2} (ref >=60)
GLUCOSE: 104 mg/dL — AB (ref 65–99)
Globulin: 3.1 g/dL (calc) (ref 1.9–3.7)
Potassium: 4.2 mmol/L (ref 3.5–5.3)
Sodium: 137 mmol/L (ref 135–146)
TOTAL PROTEIN: 6.3 g/dL (ref 6.1–8.1)

## 2017-06-04 LAB — C-REACTIVE PROTEIN: CRP: 5.6 mg/L (ref <8.0)

## 2017-06-04 LAB — SEDIMENTATION RATE: Sed Rate: 58 mm/h — ABNORMAL HIGH (ref 0–20)

## 2017-06-07 ENCOUNTER — Encounter: Payer: Self-pay | Admitting: Infectious Disease

## 2017-06-07 ENCOUNTER — Ambulatory Visit (INDEPENDENT_AMBULATORY_CARE_PROVIDER_SITE_OTHER): Payer: Medicare Other | Admitting: Infectious Disease

## 2017-06-07 VITALS — BP 132/76 | HR 99 | Temp 97.7°F | Wt 233.0 lb

## 2017-06-07 DIAGNOSIS — A498 Other bacterial infections of unspecified site: Secondary | ICD-10-CM | POA: Diagnosis not present

## 2017-06-07 DIAGNOSIS — M05751 Rheumatoid arthritis with rheumatoid factor of right hip without organ or systems involvement: Secondary | ICD-10-CM

## 2017-06-07 DIAGNOSIS — M4620 Osteomyelitis of vertebra, site unspecified: Secondary | ICD-10-CM

## 2017-06-07 DIAGNOSIS — T847XXD Infection and inflammatory reaction due to other internal orthopedic prosthetic devices, implants and grafts, subsequent encounter: Secondary | ICD-10-CM | POA: Diagnosis not present

## 2017-06-07 DIAGNOSIS — M25551 Pain in right hip: Secondary | ICD-10-CM

## 2017-06-07 DIAGNOSIS — M05752 Rheumatoid arthritis with rheumatoid factor of left hip without organ or systems involvement: Secondary | ICD-10-CM | POA: Diagnosis not present

## 2017-06-07 NOTE — Progress Notes (Signed)
Subjective:   Chief complaint: hip pain, back pain    [name], male    DOB: Feb 11, 1941, 76 y.o.   MRN: [id number]  HPI  76 y.o. male with RA previously on [employer] with recent lumbar surgery with hardware placement complicated by CSF leak and deep infection with pseudomonas.  The patient had ESR, CRP have come down.  He has had improvement in his lower back pain but conitnues to have hip pain and he is worried that hip is coming loose"  Past Medical History:  Diagnosis Date  . Chronic lower back pain   . Complication of anesthesia 01/2017   was in ICU due to breathing complications-  . COPD (chronic obstructive pulmonary disease) ([place])   . Depression   . Dyspnea    on exertion  . GERD (gastroesophageal reflux disease)   . H/O seasonal allergies   . High cholesterol   . 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 History of kidney stones   . Hypertension   . OSA on CPAP   . Pneumonia    "I've had it several times; last time was end of Jan 2018" (05/02/2017)  . Prostate cancer ([place])   . Rheumatoid arteritis    "all over" (05/02/2017)    Past Surgical History:  Procedure Laterality Date  . BACK SURGERY    . COLONOSCOPY W/ POLYPECTOMY    . FRACTURE SURGERY    . HERNIA REPAIR    . HIP ARTHROPLASTY Left 04/2016  . INGUINAL HERNIA REPAIR Left 1986  . JOINT REPLACEMENT    . LUMBAR WOUND DEBRIDEMENT N/A 04/18/2017   Procedure: REPAIR OF LUMBAR PSEUDOMENINGOCELE;  Surgeon: Newman Pies, MD;  Location: [facility];  Service: Neurosurgery;  Laterality: N/A;  REAIR OF LUMBAR PSEUDOMENINGOCELE  . LUMBAR WOUND DEBRIDEMENT N/A 05/05/2017   Procedure: I&D Lumbar Wound;  Surgeon: Newman Pies, MD;  Location: [facility];  Service: Neurosurgery;  Laterality: N/A;  . MAXIMUM ACCESS (MAS)POSTERIOR LUMBAR INTERBODY FUSION (PLIF) 3 LEVEL  02/17/2017   [name] 02/17/2017  . POSTERIOR LAMINECTOMY / DECOMPRESSION LUMBAR SPINE  2010  . PROSTATE BIOPSY    .  PROSTATE BIOPSY  <2013 X 3  . SPLENECTOMY  1955  . TONSILLECTOMY    . TOTAL HIP ARTHROPLASTY Right 10/2015  . UMBILICAL HERNIA REPAIR  05/2016    Family History  Problem Relation Age of Onset  . Cancer Mother   . Heart disease Father       Social History   Social History  . Marital status: Married    Spouse name: N/A  . Number of children: N/A  . Years of education: N/A   Social History Main Topics  . Smoking status: Former Smoker    Packs/day: 1.50    Years: 54.00    Types: Cigarettes    Quit date: 09/27/2014  . Smokeless tobacco: Never Used     Comment: 05/02/2017 "quit vaping 09/2016"  . Alcohol use 10.2 oz/week    7 Cans of beer, 10 Shots of liquor per week  . Drug use: No  . Sexual activity: Not Currently   Other Topics Concern  . None   Social History Narrative  . None    Allergies  Allergen Reactions  . Sulfa Antibiotics Shortness Of Breath  . Varenicline Other (See Comments)    Strange thoughts     Current Outpatient Prescriptions:  .  albuterol (PROVENTIL HFA;VENTOLIN HFA) 108 (90 Base) MCG/ACT inhaler, Inhale 2  puffs into the lungs every 6 (six) hours as needed for wheezing or shortness of breath., Disp: , Rfl:  .  ciprofloxacin (CIPRO) 750 MG tablet, Take 1 tablet (750 mg total) by mouth 2 (two) times daily., Disp: 60 tablet, Rfl: 6 .  cyclobenzaprine (FLEXERIL) 10 MG tablet, Take 1 tablet (10 mg total) by mouth 3 (three) times daily as needed for muscle spasms., Disp: 50 tablet, Rfl: 0 .  docusate sodium (COLACE) 100 MG capsule, Take 1 capsule (100 mg total) by mouth 2 (two) times daily., Disp: 60 capsule, Rfl: 0 .  DULoxetine (CYMBALTA) 20 MG capsule, Take 20 mg by mouth daily as needed. Will take in the mornings if needed, Disp: , Rfl:  .  DULoxetine (CYMBALTA) 60 MG capsule, Take 60 mg by mouth every evening. , Disp: , Rfl:  .  famotidine (PEPCID) 40 MG tablet, Take 1 tablet (40 mg total) by mouth daily., Disp: 30 tablet, Rfl: 6 .  finasteride  (PROSCAR) 5 MG tablet, Take 5 mg by mouth every evening. , Disp: , Rfl:  .  fluticasone (FLONASE) 50 MCG/ACT nasal spray, Place 2 sprays into both nostrils daily as needed for allergies. , Disp: , Rfl:  .  fluticasone furoate-vilanterol (BREO ELLIPTA) 100-25 MCG/INH AEPB, Inhale 1 puff into the lungs daily., Disp: 3 each, Rfl: 3 .  folic acid (FOLVITE) 1 MG tablet, Take 1 mg by mouth daily., Disp: , Rfl:  .  gabapentin (NEURONTIN) 300 MG capsule, Take 600 mg by mouth 3 (three) times daily., Disp: , Rfl:  .  hydroxychloroquine (PLAQUENIL) 200 MG tablet, Take 200 mg by mouth 2 (two) times daily. , Disp: , Rfl:  .  lisinopril (PRINIVIL,ZESTRIL) 20 MG tablet, Take 20 mg by mouth daily., Disp: , Rfl:  .  methotrexate 2.5 MG tablet, Take 25 mg by mouth once a week. Saturday, Disp: , Rfl:  .  metoprolol succinate (TOPROL-XL) 50 MG 24 hr tablet, Take 50 mg by mouth every evening. Take with or immediately following a meal., Disp: , Rfl:  .  oxyCODONE (OXY IR/ROXICODONE) 5 MG immediate release tablet, Take 1-2 tablets (5-10 mg total) by mouth every 3 (three) hours as needed for breakthrough pain., Disp: 50 tablet, Rfl: 0 .  oxyCODONE (OXY IR/ROXICODONE) 5 MG immediate release tablet, Take 1-2 tablets (5-10 mg total) by mouth every 3 (three) hours as needed for breakthrough pain., Disp: 30 tablet, Rfl: 0 .  predniSONE (DELTASONE) 5 MG tablet, Take 5 mg by mouth daily with breakfast., Disp: , Rfl:  .  sennosides-docusate sodium (SENOKOT-S) 8.6-50 MG tablet, Take 2 tablets by mouth every evening., Disp: , Rfl:  .  simvastatin (ZOCOR) 40 MG tablet, Take 40 mg by mouth daily at 6 PM., Disp: , Rfl:  .  sodium chloride (OCEAN) 0.65 % SOLN nasal spray, Place 1 spray into both nostrils as needed for congestion., Disp: , Rfl:  .  tamsulosin (FLOMAX) 0.4 MG CAPS capsule, Take 0.4 mg by mouth daily., Disp: , Rfl:  .  Tiotropium Bromide Monohydrate (SPIRIVA RESPIMAT) 2.5 MCG/ACT AERS, Inhale 2 puffs into the lungs daily.,  Disp: 3 Inhaler, Rfl: 3 .  traMADol (ULTRAM) 50 MG tablet, Take 50-100 mg by mouth every 12 (twelve) hours as needed for severe pain. , Disp: , Rfl:  .  traZODone (DESYREL) 50 MG tablet, Take 50 mg by mouth at bedtime., Disp: , Rfl:      Review of Systems  Constitutional: Negative for chills, diaphoresis and fever.  HENT:  Negative for congestion, hearing loss, sore throat and tinnitus.   Respiratory: Negative for cough, shortness of breath and wheezing.   Cardiovascular: Negative for chest pain, palpitations and leg swelling.  Gastrointestinal: Negative for abdominal pain, blood in stool, constipation, diarrhea, nausea and vomiting.  Genitourinary: Negative for dysuria, flank pain and hematuria.  Musculoskeletal: Positive for back pain and myalgias.  Skin: Negative for rash.  Neurological: Negative for dizziness, weakness and headaches.  Hematological: Does not bruise/bleed easily.  Psychiatric/Behavioral: Negative for suicidal ideas. The patient is not nervous/anxious.        Objective:   Physical Exam  Constitutional: He is oriented to person, place, and time. He appears well-developed and well-nourished. No distress.  HENT:  Head: Normocephalic and atraumatic.  Mouth/Throat: No oropharyngeal exudate.  Eyes: Conjunctivae and EOM are normal. No scleral icterus.  Neck: Normal range of motion. Neck supple.  Cardiovascular: Normal rate and regular rhythm.   Pulmonary/Chest: Effort normal. No respiratory distress. He has no wheezes.  Abdominal: He exhibits no distension.  Musculoskeletal: He exhibits no edema or tenderness.  Neurological: He is alert and oriented to person, place, and time. He exhibits normal muscle tone. Coordination normal.  Skin: Skin is warm and dry. No rash noted. He is not diaphoretic. No erythema. No pallor.  Psychiatric: He has a normal mood and affect. His behavior is normal. Judgment and thought content normal.          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 Trudie Reed  Hip pain with loosening of prosthetic hip (he believes) this could be manifestation of infection. He needs to see orthopedics   I spent greater than 40 minutes with the patient including greater than 50% of time in face to face counsel of the patient re his deep infection, his need for protracted abx, his RA  and in coordination of his care.

## 2017-06-09 ENCOUNTER — Encounter: Payer: Self-pay | Admitting: Pulmonary Disease

## 2017-06-09 ENCOUNTER — Ambulatory Visit (INDEPENDENT_AMBULATORY_CARE_PROVIDER_SITE_OTHER): Payer: Medicare Other | Admitting: Pulmonary Disease

## 2017-06-09 VITALS — BP 130/64 | HR 106 | Ht 68.0 in | Wt 233.0 lb

## 2017-06-09 DIAGNOSIS — J432 Centrilobular emphysema: Secondary | ICD-10-CM

## 2017-06-09 DIAGNOSIS — G4733 Obstructive sleep apnea (adult) (pediatric): Secondary | ICD-10-CM | POA: Diagnosis not present

## 2017-06-09 DIAGNOSIS — Z9989 Dependence on other enabling machines and devices: Secondary | ICD-10-CM | POA: Diagnosis not present

## 2017-06-09 NOTE — Patient Instructions (Signed)
For your obstructive sleep apnea: We will order a new CPAP machine Keep using CPAP regularly  For your COPD: Get a flu shot as soon as possible Continue taking Spiriva and Breo  We will see you back in 6 months or sooner if needed

## 2017-06-09 NOTE — Progress Notes (Signed)
Subjective:    [name], male    DOB: 1941-05-17, 76 y.o.   MRN: [id number]  Synopsis: Referred for COPD in 2018. He has a history of tobacco repeat abuse and smoked 2 packs of cigarettes daily for 52 years, quit in October 2016. 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 He also has rheumatoid arthritis.  He has sleep apnea: His N. PSG on 04/25/2000 gave an index of 78/hr.  Ever since then he has  been using CPAP at [facility]  HPI Chief Complaint  Patient presents with  . Follow-up    pt states breathing is unchanged   [name] says he is doing OK from a respiratory standpoint.  He is still taking Breo and Spiriva regulalry.  He will very seldom need to use a rescue inhaler.  He had back surgery and had a skin infection that has been a problem for him.  He is following with Dr. Tommy Buchanan for this and he has been on Cipro for this.  This means he can't take his rheumatoid arthritis medicines which hurts.  No increase in cough, wheeze or dyspnea.  He has some hoarseness in his throat and saw some GI physicians over in [place] recently and had an GI endoscopy.  ENT saw him and said that they think that stress is causing his hoarseness.     Past Medical History:  Diagnosis Date  . Chronic lower back pain   . Complication of anesthesia 01/2017   was in ICU due to breathing complications-  . COPD (chronic obstructive pulmonary disease) ([place])   . Depression   . Dyspnea    on exertion  . GERD (gastroesophageal reflux disease)   . H/O seasonal allergies   . High cholesterol   . 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 History of kidney stones   . Hypertension   . OSA on CPAP   . Pneumonia    "I've had it several times; last time was end of Jan 2018" (05/02/2017)  . Prostate cancer ([place])   . Rheumatoid arteritis    "all over" (05/02/2017)       Review of Systems  Constitutional: Positive for fatigue. Negative for chills and fever.  HENT:  Negative for nosebleeds, postnasal drip, rhinorrhea, sinus pain and sinus pressure.   Respiratory: Positive for cough and shortness of breath. Negative for wheezing.   Cardiovascular: Positive for leg swelling. Negative for chest pain and palpitations.  Gastrointestinal: Negative for abdominal pain, constipation and diarrhea.  Endocrine: Negative for polydipsia and polyphagia.  Genitourinary: Negative for frequency and urgency.  Musculoskeletal: Positive for arthralgias and back pain. Negative for joint swelling and myalgias.  Neurological: Negative for seizures, speech difficulty, light-headedness and headaches.       Objective:   Physical Exam Vitals:   06/09/17 1540  BP: 130/64  Pulse: (!) 106  SpO2: 95%  Weight: 233 lb (105.7 kg)  Height: _0  (1.727 m)   RA  Gen: well appearing HENT: OP clear, TM's clear, neck supple PULM: Crackles bases bilaterally B, normal percussion CV: RRR, no mgr, trace edema GI: BS+, soft, nontender Derm: no cyanosis or rash Psyche: normal mood and affect     Pulmonary function testing: October 2015 [facility] ratio 58% FEV1 1.54 L 57% predicted, FVC 2.65 L 70% predicted, total lung capacity 157% predicted residual volume 263% predicted DLCO 61% predicted  Chest imaging: March 2018 CT chest  lung cancer screening: [facility] 2, images independently reviewed showing nonspecific scarring more in the right lung base than the left, not consistent with an interstitial lung disease  Polysomnogram: His N. PSG on 04/25/2000 gave an index of 78/hr.  Ever since then he has  been using CPAP at [facility]     Assessment & Plan:   OSA on CPAP  Centrilobular emphysema ([place])  Discussion: [name] has been stable from a respiratory standpoint please had a lot of medical problems recently with the infected wound in his back. Fortunately this is not effective his respiratory status. He has moderate airflow obstruction and gold grade C COPD without recurrent  exacerbations. He is compliant with his medication regimen.   He is also compliant with his CPAP for obstructive sleep apnea. When he is able to use the machine it works well, however lately the machine has been malfunctioning. We will order a new machine.   Plan: For your obstructive sleep apnea: We will order a new CPAP machine Keep using CPAP regularly  For your COPD: Get a flu shot as soon as possible Continue taking Spiriva and Breo  We will see you back in 6 months or sooner if needed    Current Outpatient Prescriptions:  .  albuterol (PROVENTIL HFA;VENTOLIN HFA) 108 (90 Base) MCG/ACT inhaler, Inhale 2 puffs into the lungs every 6 (six) hours as needed for wheezing or shortness of breath., Disp: , Rfl:  .  ciprofloxacin (CIPRO) 750 MG tablet, Take 1 tablet (750 mg total) by mouth 2 (two) times daily., Disp: 60 tablet, Rfl: 6 .  DULoxetine (CYMBALTA) 20 MG capsule, Take 20 mg by mouth daily as needed. Will take in the mornings if needed, Disp: , Rfl:  .  DULoxetine (CYMBALTA) 60 MG capsule, Take 60 mg by mouth every evening. , Disp: , Rfl:  .  famotidine (PEPCID) 40 MG tablet, Take 1 tablet (40 mg total) by mouth daily., Disp: 30 tablet, Rfl: 6 .  finasteride (PROSCAR) 5 MG tablet, Take 5 mg by mouth every evening. , Disp: , Rfl:  .  fluticasone (FLONASE) 50 MCG/ACT nasal spray, Place 2 sprays into both nostrils daily as needed for allergies. , Disp: , Rfl:  .  fluticasone furoate-vilanterol (BREO ELLIPTA) 100-25 MCG/INH AEPB, Inhale 1 puff into the lungs daily., Disp: 3 each, Rfl: 3 .  folic acid (FOLVITE) 1 MG tablet, Take 1 mg by mouth daily., Disp: , Rfl:  .  gabapentin (NEURONTIN) 300 MG capsule, Take 600 mg by mouth 3 (three) times daily., Disp: , Rfl:  .  hydroxychloroquine (PLAQUENIL) 200 MG tablet, Take 200 mg by mouth 2 (two) times daily. , Disp: , Rfl:  .  lisinopril (PRINIVIL,ZESTRIL) 20 MG tablet, Take 20 mg by mouth daily., Disp: , Rfl:  .  metoprolol succinate  (TOPROL-XL) 50 MG 24 hr tablet, Take 50 mg by mouth every evening. Take with or immediately following a meal., Disp: , Rfl:  .  predniSONE (DELTASONE) 5 MG tablet, Take 5 mg by mouth daily with breakfast., Disp: , Rfl:  .  sennosides-docusate sodium (SENOKOT-S) 8.6-50 MG tablet, Take 2 tablets by mouth every evening., Disp: , Rfl:  .  simvastatin (ZOCOR) 40 MG tablet, Take 40 mg by mouth daily at 6 PM., Disp: , Rfl:  .  sodium chloride (OCEAN) 0.65 % SOLN nasal spray, Place 1 spray into both nostrils as needed for congestion., Disp: , Rfl:  .  tamsulosin (FLOMAX) 0.4 MG CAPS capsule, Take  0.4 mg by mouth daily., Disp: , Rfl:  .  Tiotropium Bromide Monohydrate (SPIRIVA RESPIMAT) 2.5 MCG/ACT AERS, Inhale 2 puffs into the lungs daily., Disp: 3 Inhaler, Rfl: 3 .  traMADol (ULTRAM) 50 MG tablet, Take 50-100 mg by mouth every 12 (twelve) hours as needed for severe pain. , Disp: , Rfl:  .  traZODone (DESYREL) 50 MG tablet, Take 50 mg by mouth at bedtime., Disp: , Rfl:  .  methotrexate 2.5 MG tablet, Take 25 mg by mouth once a week. Saturday, Disp: , Rfl:

## 2017-06-09 NOTE — Addendum Note (Signed)
Addended by: Len Blalock on: 06/09/2017 04:23 PM   Modules accepted: Orders

## 2017-06-22 ENCOUNTER — Telehealth: Payer: Self-pay | Admitting: Pulmonary Disease

## 2017-06-22 NOTE — Telephone Encounter (Signed)
Spoke with Ivin Booty. Advised her that the order had been sent to [facility]. She stated that the patient had stopped by their office a few months ago and expressed interest in getting a new CPAP machine. Was advised to contact the office for an appt to get an order.   Called patient to see which DME he wanted to use. He stated that he wanted the order to stay with [facility].   Called Ivin Booty back and advised that the patient wanted to stay with [facility]. She verbalized understanding and cancelled the order.   Nothing else needed at time of call.

## 2017-07-05 ENCOUNTER — Other Ambulatory Visit: Payer: Self-pay | Admitting: Pulmonary Disease

## 2017-07-05 MED ORDER — TIOTROPIUM BROMIDE MONOHYDRATE 2.5 MCG/ACT IN AERS: 2.0000 | INHALATION_SPRAY | Freq: Every day | RESPIRATORY_TRACT | 3 refills | Status: DC

## 2017-07-28 ENCOUNTER — Encounter: Payer: Self-pay | Admitting: Pulmonary Disease

## 2017-08-10 ENCOUNTER — Encounter: Payer: Self-pay | Admitting: Infectious Disease

## 2017-08-10 ENCOUNTER — Ambulatory Visit (INDEPENDENT_AMBULATORY_CARE_PROVIDER_SITE_OTHER): Payer: Medicare Other | Admitting: Infectious Disease

## 2017-08-10 VITALS — BP 124/78 | HR 91 | Temp 98.3°F | Ht 68.0 in | Wt 236.0 lb

## 2017-08-10 DIAGNOSIS — M05741 Rheumatoid arthritis with rheumatoid factor of right hand without organ or systems involvement: Secondary | ICD-10-CM | POA: Diagnosis not present

## 2017-08-10 DIAGNOSIS — T84030A 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, initial encounter: Secondary | ICD-10-CM

## 2017-08-10 DIAGNOSIS — T84030D 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, subsequent encounter: Secondary | ICD-10-CM | POA: Diagnosis not present

## 2017-08-10 DIAGNOSIS — M4620 Osteomyelitis of vertebra, site unspecified: Secondary | ICD-10-CM

## 2017-08-10 DIAGNOSIS — M05742 Rheumatoid arthritis with rheumatoid factor of left hand without organ or systems involvement: Secondary | ICD-10-CM

## 2017-08-10 DIAGNOSIS — T847XXD Infection and inflammatory reaction due to other internal orthopedic prosthetic devices, implants and grafts, subsequent encounter: Secondary | ICD-10-CM

## 2017-08-10 DIAGNOSIS — Z9081 Acquired absence of spleen: Secondary | ICD-10-CM | POA: Diagnosis not present

## 2017-08-10 DIAGNOSIS — A498 Other bacterial infections of unspecified site: Secondary | ICD-10-CM | POA: Diagnosis not present

## 2017-08-10 HISTORY — DX: 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, initial encounter: T84.030A

## 2017-08-10 NOTE — Progress Notes (Signed)
Subjective:   Chief complaint: hip pain >> back pain    [name], male    DOB: 1941/06/26, 76 y.o.   MRN: [id number]  HPI  76 y.o. male with RA previously on Remicaid with recent lumbar surgery with hardware placement complicated by CSF leak and deep infection with pseudomonas.  The patient had ESR, CRP have come down.  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 HIs inflammatory markers that I reviewed with him are improved in September as well.  He tells me that Dr. Trudie Reed would like to start him back on either MTX or remicaid again if his infection is under control.  He tells me that he DOES have back pain when he walks and that his hip pain on the right worsens and he also develops left hip pain when this happens.   He has an appt with Dr. Maureen Ralphs to evaluate his hip pain at the end of the month.  Past Medical History:  Diagnosis Date  . Chronic lower back pain   . Complication of anesthesia 01/2017   was in ICU due to breathing complications-  . COPD (chronic obstructive pulmonary disease) ([place])   . Depression   . Dyspnea    on exertion  . GERD (gastroesophageal reflux disease)   . H/O seasonal allergies   . High cholesterol   . 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 History of kidney stones   . Hypertension   . OSA on CPAP   . Pneumonia    "I've had it several times; last time was end of Jan 2018" (05/02/2017)  . Prostate cancer ([place])   . Rheumatoid arteritis    "all over" (05/02/2017)    Past Surgical History:  Procedure Laterality Date  . BACK SURGERY    . COLONOSCOPY W/ POLYPECTOMY      . FRACTURE SURGERY    . HERNIA REPAIR    . HIP ARTHROPLASTY Left 04/2016  . INGUINAL HERNIA REPAIR Left 1986  . JOINT REPLACEMENT    . MAXIMUM ACCESS (MAS)POSTERIOR LUMBAR INTERBODY FUSION (PLIF) 3 LEVEL  02/17/2017   [name] 02/17/2017  . POSTERIOR LAMINECTOMY / DECOMPRESSION LUMBAR SPINE  2010  . PROSTATE BIOPSY    . PROSTATE BIOPSY  <2013 X 3  . SPLENECTOMY  1955  . TONSILLECTOMY    . TOTAL HIP ARTHROPLASTY Right 10/2015  . UMBILICAL HERNIA REPAIR  05/2016    Family History  Problem Relation Age of Onset  . Cancer Mother   . Heart disease Father       Social History   Socioeconomic History  . Marital status: Married    Spouse name: None  . Number of children: None  . Years of education: None  . Highest education level: None  Social Needs  . Financial resource strain: None  . Food insecurity - worry: None  . Food insecurity - inability: None  . Transportation needs - medical: None  . Transportation needs - non-medical: None  Occupational History  . None  Tobacco Use  . Smoking status: Former Smoker  Packs/day: 1.50    Years: 54.00    Pack years: 81.00    Types: Cigarettes    Last attempt to quit: 09/27/2014    Years since quitting: 2.8  . Smokeless tobacco: Never Used  . Tobacco comment: 05/02/2017 "quit vaping 09/2016"  Substance and Sexual Activity  . Alcohol use: Yes    Alcohol/week: 10.2 oz    Types: 7 Cans of beer, 10 Shots of liquor per week  . Drug use: No  . Sexual activity: Not Currently  Other Topics Concern  . None  Social History Narrative  . None    Allergies  Allergen Reactions  . Sulfa Antibiotics Shortness Of Breath  . Varenicline Other (See Comments)    Strange thoughts     Current Outpatient Medications:  .  albuterol (PROVENTIL HFA;VENTOLIN HFA) 108 (90 Base) MCG/ACT inhaler, Inhale 2 puffs into the lungs every 6 (six) hours as needed for wheezing or shortness of breath., Disp: , Rfl:  .  ciprofloxacin (CIPRO) 750 MG tablet,  Take 1 tablet (750 mg total) by mouth 2 (two) times daily., Disp: 60 tablet, Rfl: 6 .  DULoxetine (CYMBALTA) 20 MG capsule, Take 20 mg by mouth daily as needed. Will take in the mornings if needed, Disp: , Rfl:  .  DULoxetine (CYMBALTA) 60 MG capsule, Take 60 mg by mouth every evening. , Disp: , Rfl:  .  famotidine (PEPCID) 40 MG tablet, Take 1 tablet (40 mg total) by mouth daily., Disp: 30 tablet, Rfl: 6 .  finasteride (PROSCAR) 5 MG tablet, Take 5 mg by mouth every evening. , Disp: , Rfl:  .  fluticasone (FLONASE) 50 MCG/ACT nasal spray, Place 2 sprays into both nostrils daily as needed for allergies. , Disp: , Rfl:  .  fluticasone furoate-vilanterol (BREO ELLIPTA) 100-25 MCG/INH AEPB, Inhale 1 puff into the lungs daily., Disp: 3 each, Rfl: 3 .  folic acid (FOLVITE) 1 MG tablet, Take 1 mg by mouth daily., Disp: , Rfl:  .  gabapentin (NEURONTIN) 300 MG capsule, Take 600 mg by mouth 3 (three) times daily., Disp: , Rfl:  .  hydroxychloroquine (PLAQUENIL) 200 MG tablet, Take 200 mg by mouth 2 (two) times daily. , Disp: , Rfl:  .  lisinopril (PRINIVIL,ZESTRIL) 20 MG tablet, Take 20 mg by mouth daily., Disp: , Rfl:  .  methotrexate 2.5 MG tablet, Take 25 mg by mouth once a week. Saturday, Disp: , Rfl:  .  metoprolol succinate (TOPROL-XL) 50 MG 24 hr tablet, Take 50 mg by mouth every evening. Take with or immediately following a meal., Disp: , Rfl:  .  predniSONE (DELTASONE) 5 MG tablet, Take 5 mg by mouth daily with breakfast., Disp: , Rfl:  .  sennosides-docusate sodium (SENOKOT-S) 8.6-50 MG tablet, Take 2 tablets by mouth every evening., Disp: , Rfl:  .  simvastatin (ZOCOR) 40 MG tablet, Take 40 mg by mouth daily at 6 PM., Disp: , Rfl:  .  sodium chloride (OCEAN) 0.65 % SOLN nasal spray, Place 1 spray into both nostrils as needed for congestion., Disp: , Rfl:  .  tamsulosin (FLOMAX) 0.4 MG CAPS capsule, Take 0.4 mg by mouth daily., Disp: , Rfl:  .  Tiotropium Bromide Monohydrate (SPIRIVA RESPIMAT)  2.5 MCG/ACT AERS, Inhale 2 puffs into the lungs daily., Disp: 3 Inhaler, Rfl: 3 .  traMADol (ULTRAM) 50 MG tablet, Take 50-100 mg by mouth every 12 (twelve) hours as needed for severe pain. , Disp: , Rfl:  .  traZODone (DESYREL) 50  MG tablet, Take 50 mg by mouth at bedtime., Disp: , Rfl:      Review of Systems  Constitutional: Negative for chills, diaphoresis and fever.  HENT: Negative for congestion, hearing loss, sore throat and tinnitus.   Respiratory: Negative for cough, shortness of breath and wheezing.   Cardiovascular: Negative for chest pain, palpitations and leg swelling.  Gastrointestinal: Negative for abdominal pain, blood in stool, constipation, diarrhea, nausea and vomiting.  Genitourinary: Negative for dysuria, flank pain and hematuria.  Musculoskeletal: Positive for arthralgias, back pain and myalgias.  Skin: Negative for rash.  Neurological: Negative for dizziness, weakness and headaches.  Hematological: Does not bruise/bleed easily.  Psychiatric/Behavioral: Negative for suicidal ideas. The patient is not nervous/anxious.        Objective:   Physical Exam  Constitutional: He is oriented to person, place, and time. He appears well-developed and well-nourished. No distress.  HENT:  Head: Normocephalic and atraumatic.  Mouth/Throat: No oropharyngeal exudate.  Eyes: Conjunctivae and EOM are normal. No scleral icterus.  Neck: Normal range of motion. Neck supple.  Cardiovascular: Normal rate and regular rhythm.  Pulmonary/Chest: Effort normal. No respiratory distress. He has no wheezes.  Abdominal: He exhibits no distension.  Musculoskeletal: He exhibits no edema or tenderness.  Neurological: He is alert and oriented to person, place, and time. He exhibits normal muscle tone. Coordination normal.  Skin: Skin is warm and dry. No rash noted. He is not diaphoretic. No erythema. No pallor.  Psychiatric: He has a normal mood and affect. His behavior is normal. Judgment and  thought content normal.          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  I am ordering ESR, CRP BMP and CBC today  RA: he is seeing Dr. Trudie Reed. I would be OK with MTX but not TNF alpha antagonist to help manage his RA but his hip also needs to be evaluated  Hip pain  Continues to worsen with loosening of prosthetic hip (he believes) this could be manifestation of infection. He is seeing Dr Maureen Ralphs at the end of the month. Certainly if this site has also been seeded he will need surgery here with deep cultures and protracted course of abx for this site.  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 He needs Meningitis vaccine (but we dont carry form FDA approved over 70) Asked him to see PCP for Vaccine now and in 8 weeks and then every 5 years as well  I spent greater than 25 minutes with the patient including greater than 50% of time in face to face counsel of the patient and in coordination of his are with specific counsel re the reason why he would be at high risk of failure with HARDWARE present and need for immune suppression.

## 2017-08-11 ENCOUNTER — Encounter: Payer: Self-pay | Admitting: Infectious Disease

## 2017-08-11 DIAGNOSIS — T847XXD Infection and inflammatory reaction due to other internal orthopedic prosthetic devices, implants and grafts, subsequent encounter: Secondary | ICD-10-CM

## 2017-08-11 LAB — CBC WITH DIFFERENTIAL/PLATELET
BASOS ABS: 68 {cells}/uL (ref 0–200)
BASOS PCT: 0.7 %
EOS PCT: 2.8 %
Eosinophils Absolute: 272 cells/uL (ref 15–500)
HEMATOCRIT: 40.3 % (ref 38.5–50.0)
HEMOGLOBIN: 13.6 g/dL (ref 13.2–17.1)
LYMPHS ABS: 3579 {cells}/uL (ref 850–3900)
MCH: 32.2 pg (ref 27.0–33.0)
MCHC: 33.7 g/dL (ref 32.0–36.0)
MCV: 95.5 fL (ref 80.0–100.0)
MPV: 9 fL (ref 7.5–12.5)
Monocytes Relative: 19.9 %
NEUTROS ABS: 3851 {cells}/uL (ref 1500–7800)
Neutrophils Relative %: 39.7 %
Platelets: 283 10*3/uL (ref 140–400)
RBC: 4.22 10*6/uL (ref 4.20–5.80)
RDW: 13.3 % (ref 11.0–15.0)
Total Lymphocyte: 36.9 %
WBC mixed population: 1930 cells/uL — ABNORMAL HIGH (ref 200–950)
WBC: 9.7 10*3/uL (ref 3.8–10.8)

## 2017-08-11 LAB — BASIC METABOLIC PANEL WITH GFR
BUN: 19 mg/dL (ref 7–25)
CALCIUM: 9 mg/dL (ref 8.6–10.3)
CHLORIDE: 102 mmol/L (ref 98–110)
CO2: 28 mmol/L (ref 20–32)
Creat: 0.96 mg/dL (ref 0.70–1.18)
GFR, EST AFRICAN AMERICAN: 89 mL/min/{1.73_m2} (ref >=60)
GFR, Est Non African American: 77 mL/min/{1.73_m2} (ref >=60)
Glucose, Bld: 80 mg/dL (ref 65–99)
Potassium: 4.6 mmol/L (ref 3.5–5.3)
Sodium: 139 mmol/L (ref 135–146)

## 2017-08-11 LAB — C-REACTIVE PROTEIN: CRP: 0.6 mg/L (ref <8.0)

## 2017-08-11 LAB — SEDIMENTATION RATE: SED RATE: 11 mm/h (ref 0–20)

## 2017-08-11 MED ORDER — CIPROFLOXACIN HCL 750 MG PO TABS: 750.0000 mg | ORAL_TABLET | Freq: Two times a day (BID) | ORAL | 1 refills | Status: DC

## 2017-08-17 ENCOUNTER — Other Ambulatory Visit: Payer: Self-pay | Admitting: Pharmacist Clinician (PhC)/ Clinical Pharmacy Specialist

## 2017-08-17 MED ORDER — HAEMOPHILUS B POLYSAC CONJ VAC IM SOLR: 0.5000 mL | Freq: Once | INTRAMUSCULAR | 0 refills | Status: AC

## 2017-08-17 MED ORDER — MENINGOCOCCAL VAC B (OMV) IM SUSY
0.5000 mL | PREFILLED_SYRINGE | INTRAMUSCULAR | 1 refills | Status: DC
Start: 2017-08-17 — End: 2018-05-30

## 2017-08-17 MED ORDER — MENINGOCOCCAL A C Y&W-135 OLIG IM SOLR: 0.5000 mL | INTRAMUSCULAR | 1 refills | Status: DC

## 2017-08-17 NOTE — Progress Notes (Signed)
Will send vaccines to [facility] pharmacy to be given.

## 2017-08-19 MED FILL — ActHIB SOLR: 1 days supply | Qty: 1 | Fill #0

## 2017-08-19 MED FILL — BEXSERO SUSY: 28 days supply | Qty: 1 | Fill #0

## 2017-08-19 MED FILL — MENVEO A-C-Y-W-135-DIP VIAL: 56 days supply | Qty: 1 | Fill #0

## 2017-08-24 ENCOUNTER — Encounter: Payer: Self-pay | Admitting: Pulmonary Disease

## 2017-08-24 ENCOUNTER — Ambulatory Visit: Payer: Medicare Other | Admitting: Pulmonary Disease

## 2017-08-24 VITALS — BP 132/70 | HR 81 | Ht 68.0 in | Wt 246.0 lb

## 2017-08-24 DIAGNOSIS — G4733 Obstructive sleep apnea (adult) (pediatric): Secondary | ICD-10-CM

## 2017-08-24 DIAGNOSIS — J432 Centrilobular emphysema: Secondary | ICD-10-CM | POA: Diagnosis not present

## 2017-08-24 DIAGNOSIS — Z9989 Dependence on other enabling machines and devices: Secondary | ICD-10-CM | POA: Diagnosis not present

## 2017-08-24 NOTE — Patient Instructions (Signed)
Obstructive sleep apnea: We will request a compliance download report Keep using CPAP nightly  COPD: Keep taking Breo and Spiriva I am glad you had a flu shot  Follow-up 6 months from today, you can cancel the previously arranged visit.

## 2017-08-24 NOTE — Progress Notes (Signed)
Subjective:    [name], male    DOB: 09-04-1941, 76 y.o.   MRN: [id number]  Synopsis: Referred for COPD in 2018. He has a history of tobacco repeat abuse and smoked 2 packs of cigarettes daily for 52 years, quit in October 2016. 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 He also has rheumatoid arthritis.  He has sleep apnea: His N. PSG on 04/25/2000 gave an index of 78/hr.  Ever since then he has  been using CPAP at 8 C. [facility]  HPI Chief Complaint  Patient presents with  . Follow-up    pt is on new cpap machine, states machine works well.    OSA: > new CPAP machine is working OK > he says that it is more noisy than the old machine > he is sleeping with it nightly  COPD: > no new problems > still on Breo and Spiriva > had a flu shot   Past Medical History:  Diagnosis Date  . Chronic lower back pain   . Complication of anesthesia 01/2017   was in ICU due to breathing complications-  . COPD (chronic obstructive pulmonary disease) ([place])   . Depression   . Dyspnea    on exertion  . GERD (gastroesophageal reflux disease)   . H/O seasonal allergies   . High cholesterol   . 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 History of kidney stones   . Hypertension   . 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 OSA on CPAP   . Pneumonia    "I've had it several times; last time was end of Jan 2018" (05/02/2017)  . Prostate cancer ([place])   . Rheumatoid arteritis    "all over" (05/02/2017)       Review of Systems  Constitutional: Positive for fatigue. Negative for chills and fever.  HENT: Negative for nosebleeds, postnasal drip, rhinorrhea, sinus pressure and sinus pain.   Respiratory: Negative for cough, shortness of breath and wheezing.   Cardiovascular: Positive for leg swelling. Negative for chest pain and palpitations.  Musculoskeletal: Positive for arthralgias and back pain. Negative for joint swelling  and myalgias.  Neurological: Negative for seizures, speech difficulty, light-headedness and headaches.       Objective:   Physical Exam Vitals:   08/24/17 1022  BP: 132/70  Pulse: 81  SpO2: 91%  Weight: 246 lb (111.6 kg)  Height: _0  (1.727 m)   RA  Gen: well appearing HENT: OP clear, TM's clear, neck supple PULM: Crackles bases B, normal percussion CV: RRR, no mgr, trace edema GI: BS+, soft, nontender Derm: no cyanosis or rash Psyche: normal mood and affect    Pulmonary function testing: October 2015 from [facility] ratio 58% FEV1 1.54 L 57% predicted, FVC 2.65 L 70% predicted, total lung capacity 157% predicted residual volume 263% predicted DLCO 61% predicted  Chest imaging: March 2018 CT chest lung cancer screening: [facility] 2, images independently reviewed showing nonspecific scarring more in the right lung base than the left, not consistent with an interstitial lung disease  Polysomnogram: His N. PSG on 04/25/2000 gave an index of 78/hr.  Ever since then he has  been using CPAP at 8 [facility]. [facility]     Assessment & Plan:   OSA on CPAP  Centrilobular emphysema ([place])  Discussion: This has been a stable interval for [place].  He is compliant with his new CPAP machine.  He has  not had an exacerbation of COPD since last visit.  He is compliant with his controller medicines.  He may need to have orthopedic surgery soon.  He is low to moderate risk for a pulmonary complication.  Given the fact that he has done well with 3 separate surgeries this year there is no reason to bring him back for additional pulmonary clearance.  He just needs to make sure that he takes his Spiriva and Breo on the day of surgery.  Plan: Obstructive sleep apnea: We will request a compliance download report Keep using CPAP nightly  COPD: Keep taking Breo and Spiriva I am glad you had a flu shot  Follow-up 6 months from today, you can cancel the previously arranged visit.    Current Outpatient  Medications:  .  albuterol (PROVENTIL HFA;VENTOLIN HFA) 108 (90 Base) MCG/ACT inhaler, Inhale 2 puffs into the lungs every 6 (six) hours as needed for wheezing or shortness of breath., Disp: , Rfl:  .  ciprofloxacin (CIPRO) 750 MG tablet, Take 1 tablet (750 mg total) 2 (two) times daily by mouth., Disp: 180 tablet, Rfl: 1 .  DULoxetine (CYMBALTA) 20 MG capsule, Take 20 mg by mouth daily as needed. Will take in the mornings if needed, Disp: , Rfl:  .  DULoxetine (CYMBALTA) 60 MG capsule, Take 60 mg by mouth every evening. , Disp: , Rfl:  .  famotidine (PEPCID) 40 MG tablet, Take 1 tablet (40 mg total) by mouth daily., Disp: 30 tablet, Rfl: 6 .  finasteride (PROSCAR) 5 MG tablet, Take 5 mg by mouth every evening. , Disp: , Rfl:  .  fluticasone (FLONASE) 50 MCG/ACT nasal spray, Place 2 sprays into both nostrils daily as needed for allergies. , Disp: , Rfl:  .  fluticasone furoate-vilanterol (BREO ELLIPTA) 100-25 MCG/INH AEPB, Inhale 1 puff into the lungs daily., Disp: 3 each, Rfl: 3 .  folic acid (FOLVITE) 1 MG tablet, Take 1 mg by mouth daily., Disp: , Rfl:  .  gabapentin (NEURONTIN) 300 MG capsule, Take 600 mg by mouth 3 (three) times daily., Disp: , Rfl:  .  hydroxychloroquine (PLAQUENIL) 200 MG tablet, Take 200 mg by mouth 2 (two) times daily. , Disp: , Rfl:  .  lisinopril (PRINIVIL,ZESTRIL) 20 MG tablet, Take 20 mg by mouth daily., Disp: , Rfl:  .  meningococcal B (BEXSERO) SUSY injection, Inject 0.5 mLs into the muscle every 28 (twenty-eight) days., Disp: 0.5 mL, Rfl: 1 .  meningococcal oligosaccharide (MENVEO) injection, Inject 0.5 mLs into the muscle every 8 (eight) weeks., Disp: 0.5 mL, Rfl: 1 .  methotrexate 2.5 MG tablet, Take 25 mg by mouth once a week. Saturday, Disp: , Rfl:  .  metoprolol succinate (TOPROL-XL) 50 MG 24 hr tablet, Take 50 mg by mouth every evening. Take with or immediately following a meal., Disp: , Rfl:  .  predniSONE (DELTASONE) 5 MG tablet, Take 5 mg by mouth daily with  breakfast., Disp: , Rfl:  .  sennosides-docusate sodium (SENOKOT-S) 8.6-50 MG tablet, Take 2 tablets by mouth every evening., Disp: , Rfl:  .  simvastatin (ZOCOR) 40 MG tablet, Take 40 mg by mouth daily at 6 PM., Disp: , Rfl:  .  sodium chloride (OCEAN) 0.65 % SOLN nasal spray, Place 1 spray into both nostrils as needed for congestion., Disp: , Rfl:  .  tamsulosin (FLOMAX) 0.4 MG CAPS capsule, Take 0.4 mg by mouth daily., Disp: , Rfl:  .  Tiotropium Bromide Monohydrate (SPIRIVA RESPIMAT) 2.5 MCG/ACT AERS, Inhale  2 puffs into the lungs daily., Disp: 3 Inhaler, Rfl: 3 .  traMADol (ULTRAM) 50 MG tablet, Take 50-100 mg by mouth every 12 (twelve) hours as needed for severe pain. , Disp: , Rfl:  .  traZODone (DESYREL) 50 MG tablet, Take 50 mg by mouth at bedtime., Disp: , Rfl:

## 2017-08-25 ENCOUNTER — Ambulatory Visit: Payer: Medicare Other | Admitting: Pulmonary Disease

## 2017-09-16 MED FILL — BEXSERO SUSY: 28 days supply | Qty: 1 | Fill #1

## 2017-10-08 ENCOUNTER — Encounter: Payer: Self-pay | Admitting: Infectious Disease

## 2017-10-11 ENCOUNTER — Ambulatory Visit: Payer: Self-pay | Admitting: Orthopedic Surgery

## 2017-10-18 MED FILL — MENVEO A-C-Y-W-135-DIP VIAL: 56 days supply | Qty: 1 | Fill #1

## 2017-10-24 NOTE — Patient Instructions (Addendum)
[name]  10/24/2017   Your procedure is scheduled on: 11-02-17   Report to [facility] Main  Entrance Report to admitting at 1:15 PM   Call this number if you have problems the morning of surgery [phone]   Remember: Do not eat food or drink liquids :After Midnight. You may have a Clear Liquid Diet from Midnight until 9:45 AM. After 9:45 AM, nothing until after surgery     CLEAR LIQUID DIET   Foods Allowed                                                                     Foods Excluded  Coffee and tea, regular and decaf                             liquids that you cannot  Plain Jell-O in any flavor                                             see through such as: Fruit ices (not with fruit pulp)                                     milk, soups, orange juice  Iced Popsicles                                    All solid food Carbonated beverages, regular and diet                                    Cranberry, grape and apple juices Sports drinks like Gatorade Lightly seasoned clear broth or consume(fat free) Sugar, honey syrup  Sample Menu Breakfast                                Lunch                                     Supper Cranberry juice                    Beef broth                            Chicken broth Jell-O                                     Grape juice                           Apple juice  Coffee or tea                        Jell-O                                      Popsicle                                                Coffee or tea                        Coffee or tea  _____________________________________________________________________     Take these medicines the morning of surgery with A SIP OF WATER: Gabapentin (Neurontin). You may also bring and use your inhaler and nasal spray as needed.                                 You may not have any metal on your body including hair pins and              piercings  Do not wear  jewelry, lotions, powders or deodorant             Men may shave face and neck.   Do not bring valuables to the hospital. [place].  Contacts, dentures or bridgework may not be worn into surgery.  Leave suitcase in the car. After surgery it may be brought to your room.   Please bring your mask and tubing for your CPAP machine                Please read over the following fact sheets you were given: _____________________________________________________________________           [facility] - Preparing for Surgery Before surgery, you can play an important role.  Because skin is not sterile, your skin needs to be as free of germs as possible.  You can reduce the number of germs on your skin by washing with CHG (chlorahexidine gluconate) soap before surgery.  CHG is an antiseptic cleaner which kills germs and bonds with the skin to continue killing germs even after washing. Please DO NOT use if you have an allergy to CHG or antibacterial soaps.  If your skin becomes reddened/irritated stop using the CHG and inform your nurse when you arrive at Short Stay. Do not shave (including legs and underarms) for at least 48 hours prior to the first CHG shower.  You may shave your face/neck. Please follow these instructions carefully:  1.  Shower with CHG Soap the night before surgery and the  morning of Surgery.  2.  If you choose to wash your hair, wash your hair first as usual with your  normal  shampoo.  3.  After you shampoo, rinse your hair and body thoroughly to remove the  shampoo.                           4.  Use CHG as you would any other liquid soap.  You can apply chg directly  to the skin and wash                       Gently with a scrungie or clean washcloth.  5.  Apply the CHG Soap to your body ONLY FROM THE NECK DOWN.   Do not use on face/ open                           Wound or open sores. Avoid contact with eyes, ears mouth and genitals  (private parts).                       Wash face,  Genitals (private parts) with your normal soap.             6.  Wash thoroughly, paying special attention to the area where your surgery  will be performed.  7.  Thoroughly rinse your body with warm water from the neck down.  8.  DO NOT shower/wash with your normal soap after using and rinsing off  the CHG Soap.                9.  Pat yourself dry with a clean towel.            10.  Wear clean pajamas.            11.  Place clean sheets on your bed the night of your first shower and do not  sleep with pets. Day of Surgery : Do not apply any lotions/deodorants the morning of surgery.  Please wear clean clothes to the hospital/surgery center.  FAILURE TO FOLLOW THESE INSTRUCTIONS MAY RESULT IN THE CANCELLATION OF YOUR SURGERY PATIENT SIGNATURE_________________________________  NURSE SIGNATURE__________________________________  ________________________________________________________________________   [name]  An incentive spirometer is a tool that can help keep your lungs clear and active. This tool measures how well you are filling your lungs with each breath. Taking long deep breaths may help reverse or decrease the chance of developing breathing (pulmonary) problems (especially infection) following:  A long period of time when you are unable to move or be active. BEFORE THE PROCEDURE   If the spirometer includes an indicator to show your best effort, your nurse or respiratory therapist will set it to a desired goal.  If possible, sit up straight or lean slightly forward. Try not to slouch.  Hold the incentive spirometer in an upright position. INSTRUCTIONS FOR USE  1. Sit on the edge of your bed if possible, or sit up as far as you can in bed or on a chair. 2. Hold the incentive spirometer in an upright position. 3. Breathe out normally. 4. Place the mouthpiece in your mouth and seal your lips tightly around  it. 5. Breathe in slowly and as deeply as possible, raising the piston or the ball toward the top of the column. 6. Hold your breath for 3-5 seconds or for as long as possible. Allow the piston or ball to fall to the bottom of the column. 7. Remove the mouthpiece from your mouth and breathe out normally. 8. Rest for a few seconds and repeat Steps 1 through 7 at least 10 times every 1-2 hours when you are awake. Take your time and take a few normal breaths between deep breaths. 9. The spirometer may include an indicator to show your best effort. Use the indicator as a goal to work toward during each repetition. 10. After  each set of 10 deep breaths, practice coughing to be sure your lungs are clear. If you have an incision (the cut made at the time of surgery), support your incision when coughing by placing a pillow or rolled up towels firmly against it. Once you are able to get out of bed, walk around indoors and cough well. You may stop using the incentive spirometer when instructed by your caregiver.  RISKS AND COMPLICATIONS  Take your time so you do not get dizzy or light-headed.  If you are in pain, you may need to take or ask for pain medication before doing incentive spirometry. It is harder to take a deep breath if you are having pain. AFTER USE  Rest and breathe slowly and easily.  It can be helpful to keep track of a log of your progress. Your caregiver can provide you with a simple table to help with this. If you are using the spirometer at home, follow these instructions: [place] IF:   You are having difficultly using the spirometer.  You have trouble using the spirometer as often as instructed.  Your pain medication is not giving enough relief while using the spirometer.  You develop fever of 100.5 F (38.1 C) or higher. SEEK IMMEDIATE MEDICAL CARE IF:   You cough up bloody sputum that had not been present before.  You develop fever of 102 F (38.9 C) or  greater.  You develop worsening pain at or near the incision site. MAKE SURE YOU:   Understand these instructions.  Will watch your condition.  Will get help right away if you are not doing well or get worse. Document Released: 01/24/2007 Document Revised: 12/06/2011 Document Reviewed: 03/27/2007 [facility] Patient Information 2014 [place], [place].   ________________________________________________________________________

## 2017-10-24 NOTE — Progress Notes (Signed)
08-24-17 (Epic) Pulmonary clearance from Dr. Lake Bells via telephone encounter  05-05-17 (Epic) EKG, CXR

## 2017-10-25 ENCOUNTER — Encounter (HOSPITAL_COMMUNITY): Payer: Self-pay

## 2017-10-25 ENCOUNTER — Encounter (HOSPITAL_COMMUNITY)
Admission: RE | Admit: 2017-10-25 | Discharge: 2017-10-25 | Disposition: A | Discharge status: Home / Self Care | Payer: Medicare Other | Source: Ambulatory Visit | Attending: Orthopedic Surgery | Admitting: Orthopedic Surgery

## 2017-10-25 ENCOUNTER — Other Ambulatory Visit: Payer: Self-pay

## 2017-10-25 DIAGNOSIS — Z01812 Encounter for preprocedural laboratory examination: Secondary | ICD-10-CM | POA: Insufficient documentation

## 2017-10-25 DIAGNOSIS — M71551 Other bursitis, not elsewhere classified, right hip: Secondary | ICD-10-CM | POA: Insufficient documentation

## 2017-10-25 LAB — BASIC METABOLIC PANEL
ANION GAP: 8 (ref 5–15)
BUN: 21 mg/dL — ABNORMAL HIGH (ref 6–20)
CALCIUM: 9.3 mg/dL (ref 8.9–10.3)
CO2: 28 mmol/L (ref 22–32)
CREATININE: 1.22 mg/dL (ref 0.61–1.24)
Chloride: 101 mmol/L (ref 101–111)
GFR calc non Af Amer: 56 mL/min — ABNORMAL LOW (ref >60)
Glucose, Bld: 118 mg/dL — ABNORMAL HIGH (ref 65–99)
Potassium: 4.4 mmol/L (ref 3.5–5.1)
SODIUM: 137 mmol/L (ref 135–145)

## 2017-10-25 LAB — CBC
HCT: 41.3 % (ref 39.0–52.0)
HEMOGLOBIN: 13.7 g/dL (ref 13.0–17.0)
MCH: 33.4 pg (ref 26.0–34.0)
MCHC: 33.2 g/dL (ref 30.0–36.0)
MCV: 100.7 fL — ABNORMAL HIGH (ref 78.0–100.0)
PLATELETS: 284 10*3/uL (ref 150–400)
RBC: 4.1 MIL/uL — AB (ref 4.22–5.81)
RDW: 16 % — ABNORMAL HIGH (ref 11.5–15.5)
WBC: 13.3 10*3/uL — ABNORMAL HIGH (ref 4.0–10.5)

## 2017-11-01 MED ORDER — BUPIVACAINE LIPOSOME 1.3 % IJ SUSP
20.0000 mL | INTRAMUSCULAR | Status: DC
  Filled 2017-11-01: qty 20

## 2017-11-02 ENCOUNTER — Ambulatory Visit (HOSPITAL_COMMUNITY): Payer: Medicare Other | Admitting: Certified Registered Nurse Anesthetist

## 2017-11-02 ENCOUNTER — Encounter (HOSPITAL_COMMUNITY): Payer: Self-pay | Admitting: *Deleted

## 2017-11-02 ENCOUNTER — Other Ambulatory Visit: Payer: Self-pay

## 2017-11-02 ENCOUNTER — Encounter (HOSPITAL_COMMUNITY)
Admission: RE | Disposition: A | Discharge status: Home / Self Care | Payer: Self-pay | Source: Ambulatory Visit | Attending: Orthopedic Surgery

## 2017-11-02 ENCOUNTER — Observation Stay (HOSPITAL_COMMUNITY)
Admission: RE | Admit: 2017-11-02 | Discharge: 2017-11-03 | Disposition: A | Discharge status: Home / Self Care | Payer: Medicare Other | Source: Ambulatory Visit | Attending: Orthopedic Surgery | Admitting: Orthopedic Surgery

## 2017-11-02 DIAGNOSIS — Y939 Activity, unspecified: Secondary | ICD-10-CM | POA: Insufficient documentation

## 2017-11-02 DIAGNOSIS — Z96641 Presence of right artificial hip joint: Secondary | ICD-10-CM | POA: Diagnosis not present

## 2017-11-02 DIAGNOSIS — Z8546 Personal history of malignant neoplasm of prostate: Secondary | ICD-10-CM | POA: Diagnosis not present

## 2017-11-02 DIAGNOSIS — Z86010 Personal history of colonic polyps: Secondary | ICD-10-CM | POA: Diagnosis not present

## 2017-11-02 DIAGNOSIS — K219 Gastro-esophageal reflux disease without esophagitis: Secondary | ICD-10-CM | POA: Insufficient documentation

## 2017-11-02 DIAGNOSIS — E7800 Pure hypercholesterolemia, unspecified: Secondary | ICD-10-CM | POA: Diagnosis not present

## 2017-11-02 DIAGNOSIS — I10 Essential (primary) hypertension: Secondary | ICD-10-CM | POA: Diagnosis not present

## 2017-11-02 DIAGNOSIS — Z79899 Other long term (current) drug therapy: Secondary | ICD-10-CM | POA: Insufficient documentation

## 2017-11-02 DIAGNOSIS — Z87442 Personal history of urinary calculi: Secondary | ICD-10-CM | POA: Insufficient documentation

## 2017-11-02 DIAGNOSIS — Z888 Allergy status to other drugs, medicaments and biological substances status: Secondary | ICD-10-CM | POA: Insufficient documentation

## 2017-11-02 DIAGNOSIS — G4733 Obstructive sleep apnea (adult) (pediatric): Secondary | ICD-10-CM | POA: Insufficient documentation

## 2017-11-02 DIAGNOSIS — Z882 Allergy status to sulfonamides status: Secondary | ICD-10-CM | POA: Insufficient documentation

## 2017-11-02 DIAGNOSIS — X58XXXA Exposure to other specified factors, initial encounter: Secondary | ICD-10-CM | POA: Insufficient documentation

## 2017-11-02 DIAGNOSIS — S76811A Strain of other specified muscles, fascia and tendons at thigh level, right thigh, initial encounter: Principal | ICD-10-CM | POA: Insufficient documentation

## 2017-11-02 DIAGNOSIS — F329 Major depressive disorder, single episode, unspecified: Secondary | ICD-10-CM | POA: Insufficient documentation

## 2017-11-02 DIAGNOSIS — Z7951 Long term (current) use of inhaled steroids: Secondary | ICD-10-CM | POA: Diagnosis not present

## 2017-11-02 DIAGNOSIS — Z9081 Acquired absence of spleen: Secondary | ICD-10-CM | POA: Diagnosis not present

## 2017-11-02 DIAGNOSIS — R0600 Dyspnea, unspecified: Secondary | ICD-10-CM | POA: Insufficient documentation

## 2017-11-02 DIAGNOSIS — Z7982 Long term (current) use of aspirin: Secondary | ICD-10-CM | POA: Diagnosis not present

## 2017-11-02 DIAGNOSIS — J449 Chronic obstructive pulmonary disease, unspecified: Secondary | ICD-10-CM | POA: Insufficient documentation

## 2017-11-02 DIAGNOSIS — G8929 Other chronic pain: Secondary | ICD-10-CM | POA: Diagnosis not present

## 2017-11-02 DIAGNOSIS — Z87891 Personal history of nicotine dependence: Secondary | ICD-10-CM | POA: Insufficient documentation

## 2017-11-02 DIAGNOSIS — M545 Low back pain: Secondary | ICD-10-CM | POA: Insufficient documentation

## 2017-11-02 DIAGNOSIS — M6798 Unspecified disorder of synovium and tendon, other site: Secondary | ICD-10-CM | POA: Diagnosis present

## 2017-11-02 DIAGNOSIS — M069 Rheumatoid arthritis, unspecified: Secondary | ICD-10-CM | POA: Diagnosis not present

## 2017-11-02 DIAGNOSIS — M67951 Unspecified disorder of synovium and tendon, right thigh: Secondary | ICD-10-CM

## 2017-11-02 HISTORY — PX: OPEN SURGICAL REPAIR OF GLUTEAL TENDON: SHX5995

## 2017-11-02 SURGERY — REPAIR, TENDON, GLUTEUS MEDIUS, OPEN
Anesthesia: General | Site: Hip | Laterality: Right

## 2017-11-02 MED ORDER — METOPROLOL SUCCINATE ER 25 MG PO TB24
25.0000 mg | ORAL_TABLET | Freq: Every day | ORAL | Status: DC
  Administered 2017-11-02: 25 mg via ORAL
  Filled 2017-11-02: qty 1

## 2017-11-02 MED ORDER — ONDANSETRON HCL 4 MG/2ML IJ SOLN
INTRAMUSCULAR | Status: DC | PRN
  Administered 2017-11-02: 4 mg via INTRAVENOUS

## 2017-11-02 MED ORDER — GABAPENTIN 300 MG PO CAPS
600.0000 mg | ORAL_CAPSULE | Freq: Three times a day (TID) | ORAL | Status: DC
  Administered 2017-11-02 – 2017-11-03 (×3): 600 mg via ORAL
  Filled 2017-11-02 (×3): qty 2

## 2017-11-02 MED ORDER — ONDANSETRON HCL 4 MG PO TABS: 4.0000 mg | ORAL_TABLET | Freq: Four times a day (QID) | ORAL | Status: DC | PRN

## 2017-11-02 MED ORDER — SUGAMMADEX SODIUM 200 MG/2ML IV SOLN
INTRAVENOUS | Status: AC
  Filled 2017-11-02: qty 2

## 2017-11-02 MED ORDER — PROPOFOL 10 MG/ML IV BOLUS
INTRAVENOUS | Status: DC | PRN
  Administered 2017-11-02: 180 mg via INTRAVENOUS

## 2017-11-02 MED ORDER — ROCURONIUM BROMIDE 10 MG/ML (PF) SYRINGE
PREFILLED_SYRINGE | INTRAVENOUS | Status: DC | PRN
  Administered 2017-11-02: 50 mg via INTRAVENOUS

## 2017-11-02 MED ORDER — FOLIC ACID 1 MG PO TABS
1.0000 mg | ORAL_TABLET | Freq: Every day | ORAL | Status: DC
  Administered 2017-11-02 – 2017-11-03 (×2): 1 mg via ORAL
  Filled 2017-11-02 (×2): qty 1

## 2017-11-02 MED ORDER — METOCLOPRAMIDE HCL 5 MG/ML IJ SOLN: 5.0000 mg | Freq: Three times a day (TID) | INTRAMUSCULAR | Status: DC | PRN

## 2017-11-02 MED ORDER — BUPIVACAINE HCL (PF) 0.25 % IJ SOLN
INTRAMUSCULAR | Status: AC
  Filled 2017-11-02: qty 30

## 2017-11-02 MED ORDER — FENTANYL CITRATE (PF) 100 MCG/2ML IJ SOLN
INTRAMUSCULAR | Status: AC
  Administered 2017-11-02: 50 ug via INTRAVENOUS
  Filled 2017-11-02: qty 2

## 2017-11-02 MED ORDER — METHOCARBAMOL 1000 MG/10ML IJ SOLN
500.0000 mg | Freq: Four times a day (QID) | INTRAMUSCULAR | Status: DC | PRN
  Administered 2017-11-02: 500 mg via INTRAVENOUS
  Filled 2017-11-02: qty 550

## 2017-11-02 MED ORDER — OXYCODONE HCL 5 MG PO TABS
10.0000 mg | ORAL_TABLET | ORAL | Status: DC | PRN
  Administered 2017-11-02 – 2017-11-03 (×4): 10 mg via ORAL
  Filled 2017-11-02 (×4): qty 2

## 2017-11-02 MED ORDER — TRAZODONE HCL 50 MG PO TABS
50.0000 mg | ORAL_TABLET | Freq: Every day | ORAL | Status: DC
  Administered 2017-11-02: 22:00:00 50 mg via ORAL
  Filled 2017-11-02: qty 1

## 2017-11-02 MED ORDER — 0.9 % SODIUM CHLORIDE (POUR BTL) OPTIME
TOPICAL | Status: DC | PRN
  Administered 2017-11-02: 1000 mL

## 2017-11-02 MED ORDER — CHLORHEXIDINE GLUCONATE 4 % EX LIQD
60.0000 mL | Freq: Once | CUTANEOUS | Status: AC
  Administered 2017-11-02: 4 via TOPICAL

## 2017-11-02 MED ORDER — CIPROFLOXACIN HCL 500 MG PO TABS
750.0000 mg | ORAL_TABLET | Freq: Two times a day (BID) | ORAL | Status: DC
  Administered 2017-11-02 – 2017-11-03 (×2): 750 mg via ORAL
  Filled 2017-11-02 (×2): qty 2

## 2017-11-02 MED ORDER — DULOXETINE HCL 60 MG PO CPEP
60.0000 mg | ORAL_CAPSULE | Freq: Every evening | ORAL | Status: DC
  Administered 2017-11-02: 19:00:00 60 mg via ORAL
  Filled 2017-11-02: qty 1

## 2017-11-02 MED ORDER — FLUTICASONE FUROATE-VILANTEROL 100-25 MCG/INH IN AEPB
1.0000 | INHALATION_SPRAY | Freq: Every day | RESPIRATORY_TRACT | Status: DC
  Filled 2017-11-02: qty 28

## 2017-11-02 MED ORDER — TAMSULOSIN HCL 0.4 MG PO CAPS
0.4000 mg | ORAL_CAPSULE | Freq: Every day | ORAL | Status: DC
  Administered 2017-11-02: 0.4 mg via ORAL
  Filled 2017-11-02: qty 1

## 2017-11-02 MED ORDER — FENTANYL CITRATE (PF) 100 MCG/2ML IJ SOLN
50.0000 ug | Freq: Once | INTRAMUSCULAR | Status: AC
  Administered 2017-11-02: 50 ug via INTRAVENOUS

## 2017-11-02 MED ORDER — DEXAMETHASONE SODIUM PHOSPHATE 10 MG/ML IJ SOLN
INTRAMUSCULAR | Status: AC
  Filled 2017-11-02: qty 1

## 2017-11-02 MED ORDER — METHOCARBAMOL 500 MG PO TABS
500.0000 mg | ORAL_TABLET | Freq: Four times a day (QID) | ORAL | Status: DC | PRN
Start: 2017-11-02 — End: 2017-11-03

## 2017-11-02 MED ORDER — ONDANSETRON HCL 4 MG/2ML IJ SOLN
INTRAMUSCULAR | Status: AC
  Filled 2017-11-02: qty 2

## 2017-11-02 MED ORDER — LIDOCAINE 2% (20 MG/ML) 5 ML SYRINGE
INTRAMUSCULAR | Status: DC | PRN
  Administered 2017-11-02: 100 mg via INTRAVENOUS

## 2017-11-02 MED ORDER — METOCLOPRAMIDE HCL 5 MG/ML IJ SOLN: 10.0000 mg | Freq: Once | INTRAMUSCULAR | Status: DC | PRN

## 2017-11-02 MED ORDER — FENTANYL CITRATE (PF) 250 MCG/5ML IJ SOLN
INTRAMUSCULAR | Status: AC
Start: 2017-11-02 — End: 2017-11-02
  Filled 2017-11-02: qty 5

## 2017-11-02 MED ORDER — TRAMADOL HCL 50 MG PO TABS: 50.0000 mg | ORAL_TABLET | Freq: Two times a day (BID) | ORAL | Status: DC | PRN

## 2017-11-02 MED ORDER — TIOTROPIUM BROMIDE MONOHYDRATE 18 MCG IN CAPS
1.0000 | ORAL_CAPSULE | Freq: Every day | RESPIRATORY_TRACT | Status: DC
  Filled 2017-11-02: qty 5

## 2017-11-02 MED ORDER — FAMOTIDINE 20 MG PO TABS
40.0000 mg | ORAL_TABLET | Freq: Every day | ORAL | Status: DC
  Administered 2017-11-02: 22:00:00 40 mg via ORAL
  Filled 2017-11-02: qty 2

## 2017-11-02 MED ORDER — ONDANSETRON HCL 4 MG/2ML IJ SOLN: 4.0000 mg | Freq: Four times a day (QID) | INTRAMUSCULAR | Status: DC | PRN

## 2017-11-02 MED ORDER — ENOXAPARIN SODIUM 40 MG/0.4ML ~~LOC~~ SOLN
40.0000 mg | SUBCUTANEOUS | Status: DC
  Administered 2017-11-03: 40 mg via SUBCUTANEOUS
  Filled 2017-11-02: qty 0.4

## 2017-11-02 MED ORDER — OXYCODONE HCL 5 MG PO TABS
5.0000 mg | ORAL_TABLET | ORAL | Status: DC | PRN
  Administered 2017-11-02 (×2): 5 mg via ORAL
  Filled 2017-11-02 (×2): qty 1

## 2017-11-02 MED ORDER — CEFAZOLIN SODIUM-DEXTROSE 2-4 GM/100ML-% IV SOLN
2.0000 g | INTRAVENOUS | Status: AC
  Administered 2017-11-02: 2 g via INTRAVENOUS
  Filled 2017-11-02: qty 100

## 2017-11-02 MED ORDER — METOCLOPRAMIDE HCL 5 MG PO TABS: 5.0000 mg | ORAL_TABLET | Freq: Three times a day (TID) | ORAL | Status: DC | PRN

## 2017-11-02 MED ORDER — SODIUM CHLORIDE 0.9 % IV SOLN
INTRAVENOUS | Status: DC
  Administered 2017-11-02: 18:00:00 via INTRAVENOUS

## 2017-11-02 MED ORDER — BUPIVACAINE HCL (PF) 0.25 % IJ SOLN
INTRAMUSCULAR | Status: DC | PRN
  Administered 2017-11-02: 30 mL

## 2017-11-02 MED ORDER — SODIUM CHLORIDE 0.9 % IJ SOLN
INTRAMUSCULAR | Status: AC
  Filled 2017-11-02: qty 50

## 2017-11-02 MED ORDER — ACETAMINOPHEN 500 MG PO TABS
1000.0000 mg | ORAL_TABLET | Freq: Four times a day (QID) | ORAL | Status: DC
  Administered 2017-11-02 – 2017-11-03 (×2): 1000 mg via ORAL
  Filled 2017-11-02 (×2): qty 2

## 2017-11-02 MED ORDER — SODIUM CHLORIDE 0.9 % IJ SOLN
INTRAMUSCULAR | Status: DC | PRN
  Administered 2017-11-02: 30 mL

## 2017-11-02 MED ORDER — FENTANYL CITRATE (PF) 100 MCG/2ML IJ SOLN
50.0000 ug | Freq: Once | INTRAMUSCULAR | Status: AC
  Administered 2017-11-02: 50 ug via INTRAVENOUS
  Filled 2017-11-02: qty 2

## 2017-11-02 MED ORDER — FLUTICASONE PROPIONATE 50 MCG/ACT NA SUSP: 2.0000 | Freq: Every day | NASAL | Status: DC | PRN

## 2017-11-02 MED ORDER — FENTANYL CITRATE (PF) 100 MCG/2ML IJ SOLN
25.0000 ug | INTRAMUSCULAR | Status: DC | PRN
  Administered 2017-11-02 (×2): 50 ug via INTRAVENOUS

## 2017-11-02 MED ORDER — CEFAZOLIN SODIUM-DEXTROSE 2-4 GM/100ML-% IV SOLN
2.0000 g | Freq: Four times a day (QID) | INTRAVENOUS | Status: AC
  Administered 2017-11-02 – 2017-11-03 (×3): 2 g via INTRAVENOUS
  Filled 2017-11-02 (×3): qty 100

## 2017-11-02 MED ORDER — PREDNISONE 5 MG PO TABS
5.0000 mg | ORAL_TABLET | Freq: Every day | ORAL | Status: DC
  Administered 2017-11-03: 09:00:00 5 mg via ORAL
  Filled 2017-11-02: qty 1

## 2017-11-02 MED ORDER — FENTANYL CITRATE (PF) 100 MCG/2ML IJ SOLN
INTRAMUSCULAR | Status: DC | PRN
  Administered 2017-11-02 (×3): 50 ug via INTRAVENOUS
  Administered 2017-11-02: 100 ug via INTRAVENOUS

## 2017-11-02 MED ORDER — FINASTERIDE 5 MG PO TABS
5.0000 mg | ORAL_TABLET | Freq: Every day | ORAL | Status: DC
  Administered 2017-11-02: 22:00:00 5 mg via ORAL
  Filled 2017-11-02: qty 1

## 2017-11-02 MED ORDER — MEPERIDINE HCL 50 MG/ML IJ SOLN: 6.2500 mg | INTRAMUSCULAR | Status: DC | PRN

## 2017-11-02 MED ORDER — DEXAMETHASONE SODIUM PHOSPHATE 10 MG/ML IJ SOLN
10.0000 mg | Freq: Once | INTRAMUSCULAR | Status: AC
Start: 2017-11-02 — End: 2017-11-02
  Administered 2017-11-02: 10 mg via INTRAVENOUS

## 2017-11-02 MED ORDER — ALBUTEROL SULFATE (2.5 MG/3ML) 0.083% IN NEBU: 3.0000 mL | INHALATION_SOLUTION | Freq: Four times a day (QID) | RESPIRATORY_TRACT | Status: DC | PRN

## 2017-11-02 MED ORDER — SUGAMMADEX SODIUM 200 MG/2ML IV SOLN
INTRAVENOUS | Status: DC | PRN
  Administered 2017-11-02: 200 mg via INTRAVENOUS

## 2017-11-02 MED ORDER — LACTATED RINGERS IV SOLN
INTRAVENOUS | Status: DC
  Administered 2017-11-02: 13:00:00 via INTRAVENOUS

## 2017-11-02 MED ORDER — SIMVASTATIN 20 MG PO TABS
40.0000 mg | ORAL_TABLET | Freq: Every day | ORAL | Status: DC
  Administered 2017-11-02: 22:00:00 40 mg via ORAL
  Filled 2017-11-02: qty 2

## 2017-11-02 MED ORDER — ACETAMINOPHEN 10 MG/ML IV SOLN
1000.0000 mg | Freq: Once | INTRAVENOUS | Status: AC
  Administered 2017-11-02: 1000 mg via INTRAVENOUS
  Filled 2017-11-02: qty 100

## 2017-11-02 MED ORDER — PROPOFOL 10 MG/ML IV BOLUS
INTRAVENOUS | Status: AC
  Filled 2017-11-02: qty 20

## 2017-11-02 MED ORDER — BUPIVACAINE LIPOSOME 1.3 % IJ SUSP
INTRAMUSCULAR | Status: DC | PRN
  Administered 2017-11-02: 20 mL

## 2017-11-02 MED ORDER — POVIDONE-IODINE 10 % EX SWAB
2.0000 "application " | Freq: Once | CUTANEOUS | Status: AC
  Administered 2017-11-02: 2 via TOPICAL

## 2017-11-02 SURGICAL SUPPLY — 47 items
BAG SPEC THK2 15X12 ZIP CLS (MISCELLANEOUS)
BAG ZIPLOCK 12X15 (MISCELLANEOUS) IMPLANT
BIT DRILL 2.0X128 (BIT) ×1 IMPLANT
BIT DRILL 2.8X128 (BIT) ×1 IMPLANT
BLADE EXTENDED COATED 6.5IN (ELECTRODE) IMPLANT
COVER SURGICAL LIGHT HANDLE (MISCELLANEOUS) ×2 IMPLANT
DRAPE INCISE IOBAN 66X45 STRL (DRAPES) ×2 IMPLANT
DRAPE ORTHO SPLIT 77X108 STRL (DRAPES) ×4
DRAPE POUCH INSTRU U-SHP 10X18 (DRAPES) ×2 IMPLANT
DRAPE SURG ORHT 6 SPLT 77X108 (DRAPES) ×2 IMPLANT
DRAPE U-SHAPE 47X51 STRL (DRAPES) ×2 IMPLANT
DRSG ADAPTIC 3X8 NADH LF (GAUZE/BANDAGES/DRESSINGS) ×2 IMPLANT
DRSG MEPILEX BORDER 4X8 (GAUZE/BANDAGES/DRESSINGS) ×2 IMPLANT
ELECT REM PT RETURN 15FT ADLT (MISCELLANEOUS) ×2 IMPLANT
EVACUATOR 1/8 PVC DRAIN (DRAIN) IMPLANT
FACESHIELD WRAPAROUND (MASK) ×6 IMPLANT
FACESHIELD WRAPAROUND OR TEAM (MASK) IMPLANT
GAUZE SPONGE 4X4 12PLY STRL (GAUZE/BANDAGES/DRESSINGS) ×2 IMPLANT
GLOVE BIO SURGEON STRL SZ7.5 (GLOVE) ×2 IMPLANT
GLOVE BIO SURGEON STRL SZ8 (GLOVE) ×2 IMPLANT
GLOVE BIOGEL PI IND STRL 8 (GLOVE) ×2 IMPLANT
GLOVE BIOGEL PI INDICATOR 8 (GLOVE) ×2
GOWN STRL REUS W/TWL LRG LVL3 (GOWN DISPOSABLE) ×2 IMPLANT
GOWN STRL REUS W/TWL XL LVL3 (GOWN DISPOSABLE) ×2 IMPLANT
KIT BASIN OR (CUSTOM PROCEDURE TRAY) ×2 IMPLANT
MANIFOLD NEPTUNE II (INSTRUMENTS) ×2 IMPLANT
NDL MA TROC 1/2 (NEEDLE) IMPLANT
NDL SAFETY ECLIPSE 18X1.5 (NEEDLE) ×2 IMPLANT
NEEDLE HYPO 18GX1.5 SHARP (NEEDLE) ×4
NEEDLE MA TROC 1/2 (NEEDLE) ×2 IMPLANT
NS IRRIG 1000ML POUR BTL (IV SOLUTION) ×2 IMPLANT
PACK TOTAL JOINT (CUSTOM PROCEDURE TRAY) ×2 IMPLANT
PASSER SUT SWANSON 36MM LOOP (INSTRUMENTS) ×1 IMPLANT
POSITIONER SURGICAL ARM (MISCELLANEOUS) ×2 IMPLANT
STAPLER VISISTAT 35W (STAPLE) IMPLANT
STRIP CLOSURE SKIN 1/2X4 (GAUZE/BANDAGES/DRESSINGS) ×2 IMPLANT
SUT ETHIBOND NAB CT1 #1 30IN (SUTURE) ×4 IMPLANT
SUT MNCRL AB 4-0 PS2 18 (SUTURE) ×2 IMPLANT
SUT STRATAFIX 0 PDS 27 VIOLET (SUTURE) ×2
SUT VIC AB 2-0 CT1 27 (SUTURE) ×4
SUT VIC AB 2-0 CT1 TAPERPNT 27 (SUTURE) ×2 IMPLANT
SUT VLOC 180 0 24IN GS25 (SUTURE) ×1 IMPLANT
SUTURE STRATFX 0 PDS 27 VIOLET (SUTURE) IMPLANT
SYR 20CC LL (SYRINGE) ×1 IMPLANT
SYR 50ML LL SCALE MARK (SYRINGE) ×2 IMPLANT
SYRINGE 60CC LL (MISCELLANEOUS) ×1 IMPLANT
TOWEL OR 17X26 10 PK STRL BLUE (TOWEL DISPOSABLE) ×3 IMPLANT

## 2017-11-02 NOTE — Op Note (Signed)
NAMECYREE, [name] NO.:  [id number]  MEDICAL RECORD NO.:  [id number]  LOCATION:                                 FACILITY:  PHYSICIAN:  Gaynelle Arabian, M.D.         DATE OF BIRTH:  DATE OF PROCEDURE:  11/02/2017 DATE OF DISCHARGE:                              OPERATIVE REPORT   PREOPERATIVE DIAGNOSIS:  Right hip gluteal tendon tear.  POSTOPERATIVE DIAGNOSIS:  Right hip gluteal tendon tear.  PROCEDURE:  Right hip gluteal tendon repair.  SURGEON:  Gaynelle Arabian, M.D.  ASSISTANT:  Alexzandrew L. Perkins, PA-C.  ANESTHESIA:  General.  ESTIMATED BLOOD LOSS:  25 cc.  DRAIN:  None.  COMPLICATIONS:  None.  CONDITION:  Stable to recovery.  BRIEF CLINICAL NOTE:  Mr. Haffey is a 77 year old male, had a right total hip arthroplasty done about 2 years ago.  He developed severe right lateral hip pain last year.  It was also associated and occurring in conjunction with two back surgeries.  He also had an infection after one of his back surgeries.  He presented with severe lateral hip pain. There could be a concern, was infection.  MRI showed fluid collection, which was aspirated and negative for infection.  Blood work was also negative for infection.  The MRI did show gluteus medius tendon tear. He presents now for tendon repair.  PROCEDURE IN DETAIL:  After successful administration of general anesthetic, the patient was placed in the left lateral decubitus position with the right side up and held with the hip positioner.  Right lower extremity was isolated from his perineum with plastic drapes, and prepped and draped in usual sterile fashion.  Lateral incision was placed, centered at the tip of the greater trochanter about 4 inches in length.  Skin was cut with a 10-blade through the subcutaneous tissue to the level of the fascia lata, which was incised in line with the skin incision.  The greater trochanter was completely bald as the entire anterior and  central aspect of the gluteus medius tendon had ripped off the bone.  There was also broken Ethibond sutures in there.  This led me to believe that his initial hip replacement was done by an anterolateral approach and the entire muscular sleeve must have avulsed.  The muscle and tendon were retracted off the femur and I was able to mobilize it enough where we can get it back to the edge of the trochanter.  Multiple drill holes were made and multiple Ethibond sutures were passed through the tendon.  The suture was then passed through the bone to get a tendon to bone repair.  I then sewed tendon-to-tendon between the central and posterior aspect.  The posterior aspect was intact.  I was very satisfied with the repair and then oversewed some of the other connective tissue over this to protect it.  The wound was copiously irrigated with saline solution.  The fascia lata was then closed with a running #1 Stratafix suture.  Subcu was closed in two layers with 2-0 Vicryl and subcuticular with running 4-0 Monocryl.  Prior to closure, 20 mL of Exparel mixed with 30 mL of saline, injected  into the subcu tissues, gluteal musculature and fascia lata without difficulties.  The 30 cc of 0.25% Marcaine was injected into the same tissues.  Once closed, then the incision was cleaned and dried, and Steri-Strips and a sterile dressing applied.  He was then awakened and transported to recovery in stable condition.     Gaynelle Arabian, M.D.     FA/MEDQ  D:  11/02/2017  T:  11/02/2017  Job:  [id number]

## 2017-11-02 NOTE — H&P (Signed)
CC- TIWAN SCHNITKER is a 77 y.o. male who presents with right hip pain  Hip Pain: Patient complains of right hip pain. Onset of the symptoms was several months ago. Inciting event: He had a right Total Hip Arthroplasty done 2 years ago. He has a complex history with regards to his lower back with surgery last year complicated by infection. After this was treated he developed hip pain. It was lateral based pain with no warmth but with soft tissue swelling. He was referred to me for evaluation. The obvious concern was infection. A MARS MRI scan was performed showing a lateral based fluid collection and gluteus medius tendon tear. The fluid was aspirated and sent for analysis. There was no indication of infection and blood work also showed no infection. It was felt that the pain and swelling were due to the gluteal tendon tear. He presents now for tendon repair  Past Medical History:  Diagnosis Date  . Chronic lower back pain   . Complication of anesthesia 01/2017   was in ICU due to breathing complications-  . COPD (chronic obstructive pulmonary disease) ([place])   . Depression   . Dyspnea    on exertion  . GERD (gastroesophageal reflux disease)   . H/O seasonal allergies   . High cholesterol   . 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 History of kidney stones   . Hypertension   . 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 OSA on CPAP   . Pneumonia    "I've had it several times; last time was end of Jan 2018" (05/02/2017)  . Prostate cancer ([place])   . Rheumatoid arteritis    "all over" (05/02/2017)    Past Surgical History:  Procedure Laterality Date  . BACK SURGERY    . COLONOSCOPY W/ POLYPECTOMY    . FRACTURE SURGERY    . HERNIA REPAIR    . HIP ARTHROPLASTY Left 04/2016  . INGUINAL HERNIA REPAIR Left 1986  . JOINT REPLACEMENT    . LUMBAR WOUND DEBRIDEMENT N/A 04/18/2017   Procedure: REPAIR OF LUMBAR PSEUDOMENINGOCELE;  Surgeon:  Newman Pies, MD;  Location: [facility];  Service: Neurosurgery;  Laterality: N/A;  REAIR OF LUMBAR PSEUDOMENINGOCELE  . LUMBAR WOUND DEBRIDEMENT N/A 05/05/2017   Procedure: I&D Lumbar Wound;  Surgeon: Newman Pies, MD;  Location: [facility];  Service: Neurosurgery;  Laterality: N/A;  . MAXIMUM ACCESS (MAS)POSTERIOR LUMBAR INTERBODY FUSION (PLIF) 3 LEVEL  02/17/2017   [name] 02/17/2017  . POSTERIOR LAMINECTOMY / DECOMPRESSION LUMBAR SPINE  2010  . PROSTATE BIOPSY    . PROSTATE BIOPSY  <2013 X 3  . SPLENECTOMY  1955  . TONSILLECTOMY    . TOTAL HIP ARTHROPLASTY Right 10/2015  . UMBILICAL HERNIA REPAIR  05/2016    Prior to Admission medications   Medication Sig Start Date End Date Taking? Authorizing Provider  albuterol (PROVENTIL HFA;VENTOLIN HFA) 108 (90 Base) MCG/ACT inhaler Inhale 2 puffs into the lungs every 6 (six) hours as needed for wheezing or shortness of breath.   Yes [provider]  ciprofloxacin (CIPRO) 750 MG tablet Take 1 tablet (750 mg total) 2 (two) times daily by mouth. 08/11/17  Yes Tommy Medal, Lavell Islam, MD  DULoxetine (CYMBALTA) 60 MG capsule Take 60 mg by mouth every evening.    Yes [provider]  famotidine (PEPCID) 40 MG tablet Take 1 tablet (40 mg total) by mouth daily. Patient taking differently: Take 40 mg by  mouth at bedtime.  05/08/17  Yes Kristeen Miss, MD  finasteride (PROSCAR) 5 MG tablet Take 5 mg by mouth at bedtime.    Yes [provider]  fluticasone (FLONASE) 50 MCG/ACT nasal spray Place 2 sprays into both nostrils daily as needed for allergies.    Yes [provider]  fluticasone furoate-vilanterol (BREO ELLIPTA) 100-25 MCG/INH AEPB Inhale 1 puff into the lungs daily. 01/18/17  Yes Juanito Doom, MD  folic acid (FOLVITE) 1 MG tablet Take 1 mg by mouth daily.   Yes [provider]  gabapentin (NEURONTIN) 300 MG capsule Take 600 mg by mouth 3 (three) times daily.   Yes [provider]  hydroxychloroquine  (PLAQUENIL) 200 MG tablet Take 200 mg by mouth 2 (two) times daily.    Yes [provider]  ibuprofen (ADVIL,MOTRIN) 200 MG tablet Take 400-600 mg by mouth every 8 (eight) hours as needed (for pain.).   Yes [provider]  lisinopril (PRINIVIL,ZESTRIL) 20 MG tablet Take 20 mg by mouth at bedtime.    Yes [provider]  meningococcal oligosaccharide (MENVEO) injection Inject 0.5 mLs into the muscle every 8 (eight) weeks. Patient not taking: Reported on 10/25/2017 08/17/17  Yes Tommy Medal, Lavell Islam, MD  methotrexate 2.5 MG tablet Take 25 mg by mouth every Saturday. Saturday   Yes [provider]  metoprolol succinate (TOPROL-XL) 25 MG 24 hr tablet Take 25 mg by mouth at bedtime.   Yes [provider]  predniSONE (DELTASONE) 5 MG tablet Take 5 mg by mouth daily with breakfast.   Yes [provider]  sennosides-docusate sodium (SENOKOT-S) 8.6-50 MG tablet Take 2 tablets by mouth at bedtime as needed (for constipation.).    Yes [provider]  simvastatin (ZOCOR) 40 MG tablet Take 40 mg by mouth at bedtime.    Yes [provider]  sodium chloride (OCEAN) 0.65 % SOLN nasal spray Place 1 spray into both nostrils as needed for congestion.   Yes [provider]  tamsulosin (FLOMAX) 0.4 MG CAPS capsule Take 0.4 mg by mouth at bedtime.    Yes [provider]  Tiotropium Bromide Monohydrate (SPIRIVA RESPIMAT) 2.5 MCG/ACT AERS Inhale 2 puffs into the lungs daily. 07/05/17  Yes Juanito Doom, MD  traMADol (ULTRAM) 50 MG tablet Take 50-100 mg by mouth every 12 (twelve) hours as needed (for pain.).    Yes [provider]  traZODone (DESYREL) 50 MG tablet Take 50 mg by mouth at bedtime.   Yes [provider]  meningococcal B (BEXSERO) SUSY injection Inject 0.5 mLs into the muscle every 28 (twenty-eight) days. Patient not taking: Reported on 10/14/2017 08/17/17   Tommy Medal, Lavell Islam, MD    Physical  Examination: General appearance - alert, well appearing, and in no distress Mental status - alert, oriented to person, place, and time Chest - clear to auscultation, no wheezes, rales or rhonchi, symmetric air entry Heart - normal rate, regular rhythm, normal S1, S2, no murmurs, rubs, clicks or gallops Abdomen - soft, nontender, nondistended, no masses or organomegaly Neurological - alert, oriented, normal speech, no focal findings or movement disorder noted  A right hip exam was performed. GENERAL: no acute distress SKIN: intact SWELLING: mild WARMTH: no warmth TENDERNESS: maximal at greater trochanter ROM: normal STRENGTH: weakness with hip abduction GAIT: antalgic  ASSESSMENT:Right hip gluteal tendon tear  Plan - Right hip gluteal tendon repair. Discussed in detail with patient who elects to proceed.  Dione Plover Shakara Tweedy, MD  11/02/2017, 8:08 AM

## 2017-11-02 NOTE — Progress Notes (Signed)
Pt still having pain, Dr. Marcell Barlow notified and gave verbal order for 66mg fentanyl.

## 2017-11-02 NOTE — Anesthesia Postprocedure Evaluation (Signed)
Anesthesia Post Note  Patient: [name]  Procedure(s) Performed: Right hip gluteal tendon repair (Right Hip)     Patient location during evaluation: PACU Anesthesia Type: General Level of consciousness: awake and alert Pain management: pain level controlled Vital Signs Assessment: post-procedure vital signs reviewed and stable Respiratory status: spontaneous breathing, nonlabored ventilation, respiratory function stable and patient connected to nasal cannula oxygen Cardiovascular status: blood pressure returned to baseline and stable Postop Assessment: no apparent nausea or vomiting Anesthetic complications: no    Last Vitals:  Vitals:   11/02/17 1715 11/02/17 1815  BP: (!) 146/75 (!) 149/86  Pulse: (!) 107 97  Resp: 18 18  Temp: 37.1 C 37.1 C  SpO2: 96% 99%    Last Pain:  Vitals:   11/02/17 1815  TempSrc: Oral  PainSc:                  [name]

## 2017-11-02 NOTE — Transfer of Care (Signed)
Immediate Anesthesia Transfer of Care Note  Patient: [name]  Procedure(s) Performed: Right hip gluteal tendon repair (Right Hip)  Patient Location: PACU  Anesthesia Type:General  Level of Consciousness: awake, alert  and oriented  Airway & Oxygen Therapy: Patient Spontanous Breathing and Patient connected to face mask oxygen  Post-op Assessment: Report given to RN and Post -op Vital signs reviewed and stable  Post vital signs: Reviewed and stable  Last Vitals:  Vitals:   11/02/17 1232 11/02/17 1428  BP: 140/83 133/76  Pulse: (!) 105 91  Resp: 18 16  Temp: 37 C   SpO2: 95% 96%    Last Pain:  Vitals:   11/02/17 1428  PainSc: 7       Patients Stated Pain Goal: 2 (23/53/61 4431)  Complications: No apparent anesthesia complications

## 2017-11-02 NOTE — Anesthesia Preprocedure Evaluation (Signed)
Anesthesia Evaluation  Patient identified by MRN, date of birth, ID band Patient awake    Reviewed: Allergy & Precautions, NPO status , Patient's Chart, lab work & pertinent test results  Airway Mallampati: II  TM Distance: >3 FB Neck ROM: Full    Dental no notable dental hx.    Pulmonary sleep apnea and Continuous Positive Airway Pressure Ventilation , COPD, former smoker,    Pulmonary exam normal breath sounds clear to auscultation       Cardiovascular hypertension, Pt. on medications Normal cardiovascular exam Rhythm:Regular Rate:Normal     Neuro/Psych negative neurological ROS  negative psych ROS   GI/Hepatic negative GI ROS, Neg liver ROS,   Endo/Other  negative endocrine ROS  Renal/GU negative Renal ROS  negative genitourinary   Musculoskeletal negative musculoskeletal ROS (+)   Abdominal   Peds negative pediatric ROS (+)  Hematology negative hematology ROS (+)   Anesthesia Other Findings   Reproductive/Obstetrics negative OB ROS                             Anesthesia Physical Anesthesia Plan  ASA: III  Anesthesia Plan: General   Post-op Pain Management:    Induction: Intravenous  PONV Risk Score and Plan: 2 and Ondansetron and Treatment may vary due to age or medical condition  Airway Management Planned: Oral ETT  Additional Equipment:   Intra-op Plan:   Post-operative Plan:   Informed Consent: I have reviewed the patients History and Physical, chart, labs and discussed the procedure including the risks, benefits and alternatives for the proposed anesthesia with the patient or authorized representative who has indicated his/her understanding and acceptance.   Dental advisory given  Plan Discussed with: CRNA  Anesthesia Plan Comments:         Anesthesia Quick Evaluation

## 2017-11-02 NOTE — Anesthesia Procedure Notes (Signed)
Procedure Name: Intubation Date/Time: 11/02/2017 2:51 PM Performed by: Cordarius Benning D, CRNA Pre-anesthesia Checklist: Patient identified, Emergency Drugs available, Suction available and Patient being monitored Patient Re-evaluated:Patient Re-evaluated prior to induction Oxygen Delivery Method: Circle system utilized Preoxygenation: Pre-oxygenation with 100% oxygen Induction Type: IV induction Ventilation: Mask ventilation without difficulty Laryngoscope Size: Mac and 4 Grade View: Grade III Tube type: Oral Tube size: 7.5 mm Number of attempts: 1 Airway Equipment and Method: Stylet and Oral airway Placement Confirmation: ETT inserted through vocal cords under direct vision,  positive ETCO2 and breath sounds checked- equal and bilateral Secured at: 22 cm Tube secured with: Tape Dental Injury: Teeth and Oropharynx as per pre-operative assessment

## 2017-11-02 NOTE — Brief Op Note (Signed)
11/02/2017  4:01 PM  PATIENT:  [name]  77 y.o. male  PRE-OPERATIVE DIAGNOSIS:  RIGHT GLUTEAL TENDON TEAR   POST-OPERATIVE DIAGNOSIS:  RIGHT GLUTEAL TENDON TEAR   PROCEDURE:  Procedure(s): Right hip gluteal tendon repair (Right)  SURGEON:  Surgeon(s) and Role:    Gaynelle Arabian, MD - Primary  PHYSICIAN ASSISTANT:   ASSISTANTS: Arlee Muslim, PA-C   ANESTHESIA:   general  EBL:  25 ml  BLOOD ADMINISTERED:none  DRAINS: none   LOCAL MEDICATIONS USED:  OTHER Exparel  COUNTS:  YES  TOURNIQUET:  * No tourniquets in log *  DICTATION: .Other Dictation: Dictation Number Dictated   PLAN OF CARE: Admit for overnight observation  PATIENT DISPOSITION:  PACU - hemodynamically stable.

## 2017-11-03 DIAGNOSIS — S76811A Strain of other specified muscles, fascia and tendons at thigh level, right thigh, initial encounter: Secondary | ICD-10-CM | POA: Diagnosis not present

## 2017-11-03 MED ORDER — ASPIRIN 325 MG PO TBEC: 325.0000 mg | DELAYED_RELEASE_TABLET | Freq: Every day | ORAL | 0 refills | Status: DC

## 2017-11-03 MED ORDER — METHOCARBAMOL 500 MG PO TABS: 500.0000 mg | ORAL_TABLET | Freq: Four times a day (QID) | ORAL | 0 refills | Status: DC | PRN

## 2017-11-03 MED ORDER — OXYCODONE HCL 5 MG PO TABS: 5.0000 mg | ORAL_TABLET | ORAL | 0 refills | Status: DC | PRN

## 2017-11-03 MED ORDER — ASPIRIN EC 325 MG PO TBEC: 325.0000 mg | DELAYED_RELEASE_TABLET | Freq: Every day | ORAL | Status: DC

## 2017-11-03 NOTE — Discharge Instructions (Signed)
Dr. Gaynelle Arabian Total Joint Specialist [facility] [address] [place], [place] [address] [phone]  Bursectomy / Gluteal Tendon Repair Discharge Instructions  HOME CARE INSTRUCTIONS  Remove items at home which could result in a fall. This includes throw rugs or furniture in walking pathways.   ICE to the affected hip every three hours for 30 minutes at a time and then as needed for pain and swelling.  Continue to use ice on the hip for pain and swelling from surgery. You may notice swelling that will progress down to the foot and ankle.  This is normal after surgery.  Elevate the leg when you are not up walking on it.    Continue to use the breathing machine which will help keep your temperature down.  It is common for your temperature to cycle up and down following surgery, especially at night when you are not up moving around and exerting yourself.  The breathing machine keeps your lungs expanded and your temperature down. Sit on high chairs which makes it easier to stand.  Sit on chairs with arms. Use the chair arms to help push yourself up when arising.   No Active Abduction of the leg (No pulling leg out to the side away from the body).  Use your walker for first several days until comfortable ambulating.  DIET You may resume your previous home diet once your are discharged from the hospital.  DRESSING / WOUND CARE / SHOWERING You may shower 3 days after surgery, but keep the wounds dry during showering.  You may use an occlusive plastic wrap (Press'n Seal for example), NO SOAKING/SUBMERGING IN THE BATHTUB.  If the bandage gets wet, change with a clean dry gauze.  If the incision gets wet, pat the wound dry with a clean towel. You may start showering three days following surgery but do not submerge the incision under water.Just pat the incision dry and apply a dry gauze dressing on daily. Change the surgical dressing daily and reapply a dry dressing each  time.  ACTIVITY Walk with your walker as instructed. Use walker as long as suggested by your caregivers. Avoid periods of inactivity such as sitting longer than an hour when not asleep. This helps prevent blood clots.  You may resume a sexual relationship in one month or when given the OK by your doctor.  You may return to work once you are cleared by your doctor.  Do not drive a car for 6 weeks or until released by you surgeon.  Do not drive while taking narcotics.  WEIGHT BEARING Weight bearing as tolerated with assist device (walker, cane, etc) as directed, use it as long as suggested by your surgeon or therapist, typically at least 4-6 weeks.  POSTOPERATIVE CONSTIPATION PROTOCOL Constipation - defined medically as fewer than three stools per week and severe constipation as less than one stool per week.  One of the most common issues patients have following surgery is constipation.  Even if you have a regular bowel pattern at home, your normal regimen is likely to be disrupted due to multiple reasons following surgery.  Combination of anesthesia, postoperative narcotics, change in appetite and fluid intake all can affect your bowels.  In order to avoid complications following surgery, here are some recommendations in order to help you during your recovery period.  Colace (docusate) - Pick up an over-the-counter form of Colace or another stool softener and take twice a day as long as you  are requiring postoperative pain medications.  Take with a full glass of water daily.  If you experience loose stools or diarrhea, hold the colace until you stool forms back up.  If your symptoms do not get better within 1 week or if they get worse, check with your doctor.  Dulcolax (bisacodyl) - Pick up over-the-counter and take as directed by the product packaging as needed to assist with the movement of your bowels.  Take with a full glass of water.  Use this product as needed if not relieved by Colace  only.   MiraLax (polyethylene glycol) - Pick up over-the-counter to have on hand.  MiraLax is a solution that will increase the amount of water in your bowels to assist with bowel movements.  Take as directed and can mix with a glass of water, juice, soda, coffee, or tea.  Take if you go more than two days without a movement. Do not use MiraLax more than once per day. Call your doctor if you are still constipated or irregular after using this medication for 7 days in a row.  If you continue to have problems with postoperative constipation, please contact the office for further assistance and recommendations.  If you experience "the worst abdominal pain ever" or develop nausea or vomiting, please contact the office immediatly for further recommendations for treatment.  ITCHING  If you experience itching with your medications, try taking only a single pain pill, or even half a pain pill at a time.  You can also use Benadryl over the counter for itching or also to help with sleep.   TED HOSE STOCKINGS Wear the elastic stockings on both legs for three weeks following surgery during the day but you may remove then at night for sleeping.  MEDICATIONS See your medication summary on the After Visit Summary that the nursing staff will review with you prior to discharge.  You may have some home medications which will be placed on hold until you complete the course of blood thinner medication.  It is important for you to complete the blood thinner medication as prescribed by your surgeon.  Continue your approved medications as instructed at time of discharge.  PRECAUTIONS If you experience chest pain or shortness of breath - call 911 immediately for transfer to the hospital emergency department.  If you develop a fever greater that 101 F, purulent drainage from wound, increased redness or drainage from wound, foul odor from the wound/dressing, or calf pain - CONTACT YOUR SURGEON.                                                    FOLLOW-UP APPOINTMENTS Make sure you keep all of your appointments after your operation with your surgeon and caregivers. You should call the office at the above phone number and make an appointment for approximately two weeks after the date of your surgery or on the date instructed by your surgeon outlined in the "After Visit Summary".   Pick up stool softner and laxative for home use following surgery while on pain medications. Do not submerge incision under water. Please use good hand washing techniques while changing dressing each day. May shower starting three days after surgery. Please use a clean towel to pat the incision dry following showers. Continue to use ice for pain and swelling after surgery. Do  not use any lotions or creams on the incision until instructed by your surgeon.  Full dose enteric-coated 325 mg Aspirin daily for three weeks, then switch to a Baby Aspirin 81 mg daily for three additional weeks.

## 2017-11-03 NOTE — Discharge Summary (Signed)
Physician Discharge Summary   [name] MRN: [id number] DOB/AGE: 76-May-1942 76 y.o.  Admit date: 11/02/2017 Discharge date: 11-03-2017  Primary Diagnosis:  Right hip gluteal tendon tear.   Admission Diagnoses:  Past Medical History:  Diagnosis Date  . Chronic lower back pain   . Complication of anesthesia 01/2017   was in ICU due to breathing complications-  . COPD (chronic obstructive pulmonary disease) ([place])   . Depression   . Dyspnea    on exertion  . GERD (gastroesophageal reflux disease)   . H/O seasonal allergies   . High cholesterol   . 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 History of kidney stones   . Hypertension   . 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 OSA on CPAP   . Pneumonia    "I've had it several times; last time was end of Jan 2018" (05/02/2017)  . Prostate cancer ([place])   . Rheumatoid arteritis    "all over" (05/02/2017)   Discharge Diagnoses:   Active Problems:   Tendinopathy of right gluteus medius  Estimated body mass index is 35.43 kg/m as calculated from the following:   Height as of this encounter: 5' 8" (1.727 m).   Weight as of this encounter: 105.7 kg (233 lb 0.4 oz).  Procedure(s) (LRB): Right hip gluteal tendon repair (Right)   Consults: None  HPI: [name] is a 77 year old male, had a right total hip arthroplasty done about 2 years ago.  He developed severe right lateral hip pain last year.  It was also associated and occurring in conjunction with two back surgeries.  He also had an infection after one of his back surgeries.  He presented with severe lateral hip pain. There could be a concern, was infection.  MRI showed fluid collection, which was aspirated and negative for infection.  Blood work was also negative for infection.  The MRI did show gluteus medius tendon tear. He presents now for tendon repair.   Laboratory Data: Hospital Outpatient  Visit on 10/25/2017  Component Date Value Ref Range Status  . Sodium 10/25/2017 137  135 - 145 mmol/L Final  . Potassium 10/25/2017 4.4  3.5 - 5.1 mmol/L Final  . Chloride 10/25/2017 101  101 - 111 mmol/L Final  . CO2 10/25/2017 28  22 - 32 mmol/L Final  . Glucose, Bld 10/25/2017 118* 65 - 99 mg/dL Final  . BUN 10/25/2017 21* 6 - 20 mg/dL Final  . Creatinine, Ser 10/25/2017 1.22  0.61 - 1.24 mg/dL Final  . Calcium 10/25/2017 9.3  8.9 - 10.3 mg/dL Final  . GFR calc non Af Amer 10/25/2017 56* >60 mL/min Final  . GFR calc Af Amer 10/25/2017 >60  >60 mL/min Final   Comment: (NOTE) The eGFR has been calculated using the CKD EPI equation. This calculation has not been validated in all clinical situations. eGFR's persistently <60 mL/min signify possible Chronic Kidney Disease.   . Anion gap 10/25/2017 8  5 - 15 Final  . WBC 10/25/2017 13.3* 4.0 - 10.5 K/uL Final  . RBC 10/25/2017 4.10* 4.22 - 5.81 MIL/uL Final  . Hemoglobin 10/25/2017 13.7  13.0 - 17.0 g/dL Final  . HCT 10/25/2017 41.3  39.0 - 52.0 % Final  . MCV 10/25/2017 100.7* 78.0 - 100.0 fL Final  . MCH 10/25/2017 33.4  26.0 - 34.0 pg Final  . MCHC 10/25/2017 33.2  30.0 - 36.0 g/dL Final  . RDW 10/25/2017  16.0* 11.5 - 15.5 % Final  . Platelets 10/25/2017 284  150 - 400 K/uL Final     X-Rays:No results found.  EKG: Orders placed or performed during the hospital encounter of 05/02/17  . EKG 12-Lead  . EKG 12-Lead     Hospital Course: Patient was admitted to [facility] and taken to the OR and underwent the above state procedure without complications.  Patient tolerated the procedure well and was later transferred to the recovery room and then to the orthopaedic floor for postoperative care.  They were given PO and IV analgesics for pain control following their surgery.  They were given 24 hours of postoperative antibiotics of  Anti-infectives (From admission, onward)   Start     Dose/Rate Route Frequency Ordered Stop    11/03/17 0600  ceFAZolin (ANCEF) IVPB 2g/100 mL premix     2 g 200 mL/hr over 30 Minutes Intravenous On call to O.R. 11/02/17 1222 11/02/17 1452   11/02/17 2200  ceFAZolin (ANCEF) IVPB 2g/100 mL premix     2 g 200 mL/hr over 30 Minutes Intravenous Every 6 hours 11/02/17 1731 11/03/17 1559   11/02/17 2000  ciprofloxacin (CIPRO) tablet 750 mg     750 mg Oral 2 times daily 11/02/17 1731       and started on DVT prophylaxis in the form of Lovenox. Patient was encouraged to get up and ambulate the day after surgery.  The patient was allowed to be WBAT with therapy. Discharge planning was consulted to help with postop disposition and equipment needs.  Patient had a tough night on the evening of surgery but better the next morning.  They started to get up OOB with therapy on day one.   Patient was seen in rounds and was ready to go home following morning ambulation.   Discharge home Diet - Cardiac diet Follow up - in two weeks. Call office for appointment at [phone]. Activity - Weight bearing as tolerated to the surgical leg.  No active abduction of the leg, (no pulling leg out to the side away from the body).  Walker for first several days until comfortable ambulating. May start showering three days following surgery but do not submerge incision under water. Continue to use ice for pain and swelling from surgery.  Full dose enteric-coated 325 mg Aspirin daily for three weeks, then switch to a Baby Aspirin 81 mg daily for three additional weeks.  Please use walker for the first couple of days until comfortable ambulating.  May start changing dressing tomorrow with dry gauze and tape.  Disposition - Home Condition Upon Discharge - Stable D/C Meds - See DC Summary DVT Prophylaxis - Aspirin    Discharge Instructions    Call MD / Call 911   Complete by:  As directed    If you experience chest pain or shortness of breath, CALL 911 and be transported to the hospital emergency room.  If you develope  a fever above 101 F, pus (white drainage) or increased drainage or redness at the wound, or calf pain, call your surgeon's office.   Change dressing   Complete by:  As directed    Change dressing daily with sterile 4 x 4 inch gauze dressing and apply TED hose. Do not submerge the incision under water.   Constipation Prevention   Complete by:  As directed    Drink plenty of fluids.  Prune juice may be helpful.  You may use a stool softener, such as Colace (  over the counter) 100 mg twice a day.  Use MiraLax (over the counter) for constipation as needed.   Diet - low sodium heart healthy   Complete by:  As directed    Discharge instructions   Complete by:  As directed    Take a full dose enteric-coated 325 mg Aspirin daily for three weeks, then switch to a Baby Aspirin 81 mg daily for three additional weeks.   Pick up stool softner and laxative for home use following surgery while on pain medications. Do not submerge incision under water. Please use good hand washing techniques while changing dressing each day. May shower starting three days after surgery. Please use a clean towel to pat the incision dry following showers. Continue to use ice for pain and swelling after surgery. Do not use any lotions or creams on the incision until instructed by your surgeon.  Wear both TED hose on both legs during the day every day for three weeks, but may remove the TED hose at night at home.  Postoperative Constipation Protocol  Constipation - defined medically as fewer than three stools per week and severe constipation as less than one stool per week.  One of the most common issues patients have following surgery is constipation.  Even if you have a regular bowel pattern at home, your normal regimen is likely to be disrupted due to multiple reasons following surgery.  Combination of anesthesia, postoperative narcotics, change in appetite and fluid intake all can affect your bowels.  In order to avoid  complications following surgery, here are some recommendations in order to help you during your recovery period.  Colace (docusate) - Pick up an over-the-counter form of Colace or another stool softener and take twice a day as long as you are requiring postoperative pain medications.  Take with a full glass of water daily.  If you experience loose stools or diarrhea, hold the colace until you stool forms back up.  If your symptoms do not get better within 1 week or if they get worse, check with your doctor.  Dulcolax (bisacodyl) - Pick up over-the-counter and take as directed by the product packaging as needed to assist with the movement of your bowels.  Take with a full glass of water.  Use this product as needed if not relieved by Colace only.   MiraLax (polyethylene glycol) - Pick up over-the-counter to have on hand.  MiraLax is a solution that will increase the amount of water in your bowels to assist with bowel movements.  Take as directed and can mix with a glass of water, juice, soda, coffee, or tea.  Take if you go more than two days without a movement. Do not use MiraLax more than once per day. Call your doctor if you are still constipated or irregular after using this medication for 7 days in a row.  If you continue to have problems with postoperative constipation, please contact the office for further assistance and recommendations.  If you experience "the worst abdominal pain ever" or develop nausea or vomiting, please contact the office immediatly for further recommendations for treatment.   Do not sit on low chairs, stoools or toilet seats, as it may be difficult to get up from low surfaces   Complete by:  As directed    Driving restrictions   Complete by:  As directed    No driving until released by the physician.   Increase activity slowly as tolerated   Complete by:  As directed  Lifting restrictions   Complete by:  As directed    No lifting until released by the physician.    Patient may shower   Complete by:  As directed    You may shower without a dressing once there is no drainage.  Do not wash over the wound.  If drainage remains, do not shower until drainage stops.   Weight bearing as tolerated   Complete by:  As directed      Allergies as of 11/03/2017      Reactions   Sulfa Antibiotics Shortness Of Breath   Varenicline Other (See Comments)   Strange thoughts      Medication List    STOP taking these medications   ibuprofen 200 MG tablet Commonly known as:  ADVIL,MOTRIN   meningococcal oligosaccharide injection Commonly known as:  MENVEO     TAKE these medications   albuterol 108 (90 Base) MCG/ACT inhaler Commonly known as:  PROVENTIL HFA;VENTOLIN HFA Inhale 2 puffs into the lungs every 6 (six) hours as needed for wheezing or shortness of breath.   aspirin 325 MG EC tablet Take 1 tablet (325 mg total) by mouth daily. Full dose enteric-coated 325 mg Aspirin daily for three weeks, then switch to a Baby Aspirin 81 mg daily for three additional weeks. Start taking on:  11/04/2017   ciprofloxacin 750 MG tablet Commonly known as:  CIPRO Take 1 tablet (750 mg total) 2 (two) times daily by mouth.   DULoxetine 60 MG capsule Commonly known as:  CYMBALTA Take 60 mg by mouth every evening.   famotidine 40 MG tablet Commonly known as:  PEPCID Take 1 tablet (40 mg total) by mouth daily. What changed:  when to take this   finasteride 5 MG tablet Commonly known as:  PROSCAR Take 5 mg by mouth at bedtime.   fluticasone 50 MCG/ACT nasal spray Commonly known as:  FLONASE Place 2 sprays into both nostrils daily as needed for allergies.   fluticasone furoate-vilanterol 100-25 MCG/INH Aepb Commonly known as:  BREO ELLIPTA Inhale 1 puff into the lungs daily.   folic acid 1 MG tablet Commonly known as:  FOLVITE Take 1 mg by mouth daily.   gabapentin 300 MG capsule Commonly known as:  NEURONTIN Take 600 mg by mouth 3 (three) times daily.     hydroxychloroquine 200 MG tablet Commonly known as:  PLAQUENIL Take 200 mg by mouth 2 (two) times daily.   lisinopril 20 MG tablet Commonly known as:  PRINIVIL,ZESTRIL Take 20 mg by mouth at bedtime.   meningococcal B Susy injection Commonly known as:  BEXSERO Inject 0.5 mLs into the muscle every 28 (twenty-eight) days.   methocarbamol 500 MG tablet Commonly known as:  ROBAXIN Take 1 tablet (500 mg total) by mouth every 6 (six) hours as needed for muscle spasms.   methotrexate 2.5 MG tablet Take 25 mg by mouth every Saturday. Saturday   metoprolol succinate 25 MG 24 hr tablet Commonly known as:  TOPROL-XL Take 25 mg by mouth at bedtime.   oxyCODONE 5 MG immediate release tablet Commonly known as:  Oxy IR/ROXICODONE Take 1 tablet (5 mg total) by mouth every 4 (four) hours as needed for moderate pain or severe pain.   predniSONE 5 MG tablet Commonly known as:  DELTASONE Take 5 mg by mouth daily with breakfast.   sennosides-docusate sodium 8.6-50 MG tablet Commonly known as:  SENOKOT-S Take 2 tablets by mouth at bedtime as needed (for constipation.).   simvastatin 40 MG tablet  Commonly known as:  ZOCOR Take 40 mg by mouth at bedtime.   sodium chloride 0.65 % Soln nasal spray Commonly known as:  OCEAN Place 1 spray into both nostrils as needed for congestion.   tamsulosin 0.4 MG Caps capsule Commonly known as:  FLOMAX Take 0.4 mg by mouth at bedtime.   Tiotropium Bromide Monohydrate 2.5 MCG/ACT Aers Commonly known as:  SPIRIVA RESPIMAT Inhale 2 puffs into the lungs daily.   traMADol 50 MG tablet Commonly known as:  ULTRAM Take 50-100 mg by mouth every 12 (twelve) hours as needed (for pain.).   traZODone 50 MG tablet Commonly known as:  DESYREL Take 50 mg by mouth at bedtime.            Discharge Care Instructions  (From admission, onward)        Start     Ordered   11/03/17 0000  Weight bearing as tolerated     11/03/17 0834   11/03/17 0000   Change dressing    Comments:  Change dressing daily with sterile 4 x 4 inch gauze dressing and apply TED hose. Do not submerge the incision under water.   11/03/17 8654     Follow-up Information    Gaynelle Arabian, MD. Schedule an appointment as soon as possible for a visit on 11/15/2017.   Specialty:  Orthopedic Surgery Contact information: [address] [place] [address] [phone]           Signed: Arlee Muslim, PA-C Orthopaedic Surgery 11/03/2017, 8:38 AM

## 2017-11-03 NOTE — Evaluation (Signed)
Physical Therapy Evaluation Patient Details Name: [name] MRN: [id number] DOB: 1940-12-12 Today's Date: 11/03/2017   History of Present Illness  Pt s/p R glut tendon repair and with hx of bil THR   Clinical Impression  Pt admitted as above and presenting with functional mobility limitations * post op pain and NO active abd allowed.  Pt currently mobilizing at Sup/min guard assist but requiring cues to avoid active abd - restrictions reinforced multiple times during session.    Follow Up Recommendations DC plan and follow up therapy as arranged by surgeon    Equipment Recommendations  None recommended by PT    Recommendations for Other Services       Precautions / Restrictions Precautions Precautions: Fall;Other (comment) Precaution Comments: NO Active ABD  Restrictions Weight Bearing Restrictions: No      Mobility  Bed Mobility Overal bed mobility: Needs Assistance Bed Mobility: Supine to Sit     Supine to sit: Min guard     General bed mobility comments: cues to avoid active abd on R with exit to R side of bed  Transfers Overall transfer level: Needs assistance   Transfers: Sit to/from Stand Sit to Stand: Min guard         General transfer comment: steady assist, cues for use of UEs to self assist  Ambulation/Gait Ambulation/Gait assistance: Min guard;Supervision Ambulation Distance (Feet): 200 Feet Assistive device: Rolling walker (2 wheeled) Gait Pattern/deviations: Step-to pattern;Step-through pattern;Decreased step length - right;Decreased step length - left;Shuffle;Trunk flexed Gait velocity: decr Gait velocity interpretation: Below normal speed for age/gender General Gait Details: cues for posture, position from RW and initial sequence  Stairs            Wheelchair Mobility    Modified Rankin (Stroke Patients Only)       Balance Overall balance assessment: No apparent balance deficits (not formally assessed)                                            Pertinent Vitals/Pain Pain Assessment: 0-10 Pain Score: 4  Pain Location: rt hip Pain Descriptors / Indicators: Aching;Sore Pain Intervention(s): Limited activity within patient's tolerance;Monitored during session;Patient requesting pain meds-RN notified;Ice applied    Home Living Family/patient expects to be discharged to:: Private residence Living Arrangements: Spouse/significant other Available Help at Discharge: Family;Available 24 hours/day Type of Home: Independent living facility Home Access: Level entry     Home Layout: One level Home Equipment: Cane - single point;Walker - 2 wheels;Grab bars - tub/shower;Shower seat;Adaptive equipment;Walker - 4 wheels Additional Comments: Pt lives at [facility]     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 Assistance Needed: Uses cane with ambulation   ADL's / Homemaking Assistance Needed: Wife needs to help with compression socks         Hand Dominance   Dominant Hand: Right    Extremity/Trunk Assessment   Upper Extremity Assessment Upper Extremity Assessment: Overall WFL for tasks assessed    Lower Extremity Assessment Lower Extremity Assessment: RLE deficits/detail       Communication   Communication: No difficulties  Cognition Arousal/Alertness: Awake/alert Behavior During Therapy: WFL for tasks assessed/performed Overall Cognitive Status: Within Functional Limits for tasks assessed  General Comments      Exercises     Assessment/Plan    PT Assessment Patent does not need any further PT services  PT Problem List         PT Treatment Interventions      PT Goals (Current goals can be found in the Care Plan section)  Acute Rehab PT Goals Patient Stated Goal: Regain IND  PT Goal Formulation: All assessment and education complete, DC therapy    Frequency     Barriers to  discharge        Co-evaluation               AM-PAC PT "6 Clicks" Daily Activity  Outcome Measure Difficulty turning over in bed (including adjusting bedclothes, sheets and blankets)?: Unable Difficulty moving from lying on back to sitting on the side of the bed? : A Lot Difficulty sitting down on and standing up from a chair with arms (e.g., wheelchair, bedside commode, etc,.)?: A Lot Help needed moving to and from a bed to chair (including a wheelchair)?: A Little Help needed walking in hospital room?: A Little Help needed climbing 3-5 steps with a railing? : A Little 6 Click Score: 14    End of Session Equipment Utilized During Treatment: Gait belt Activity Tolerance: Patient tolerated treatment well Patient left: in chair;with call bell/phone within reach;with family/visitor present Nurse Communication: Mobility status PT Visit Diagnosis: Difficulty in walking, not elsewhere classified (R26.2)    Time: 1093-2355 PT Time Calculation (min) (ACUTE ONLY): 22 min   Charges:   PT Evaluation $PT Eval Low Complexity: 1 Low     PT G Codes:        Pg [phone]   Clif Serio 11/03/2017, 12:49 PM

## 2017-11-03 NOTE — Progress Notes (Signed)
   Subjective: 1 Day Post-Op Procedure(s) (LRB): Right hip gluteal tendon repair (Right) Patient reports pain as mild and moderate.  Better this morning. Patient seen in rounds with Dr. Wynelle Link.  Surgical findings discussed with patient and wife (at bedside).  Precautions discussed. Patient is well, but has had some minor complaints of pain in the hip, requiring pain medications Patient is ready to go home following therapy session.  Objective: Vital signs in last 24 hours: Temp:  [98.1 F (36.7 C)-99.1 F (37.3 C)] 98.1 F (36.7 C) (02/07 0555) Pulse Rate:  [76-114] 79 (02/07 0555) Resp:  [14-24] 16 (02/07 0555) BP: (120-149)/(70-90) 120/74 (02/07 0555) SpO2:  [95 %-99 %] 98 % (02/07 0555) Weight:  [105.7 kg (233 lb 0.4 oz)] 105.7 kg (233 lb 0.4 oz) (02/06 1815)  Intake/Output from previous day:  Intake/Output Summary (Last 24 hours) at 11/03/2017 0812 Last data filed at 11/03/2017 0600 Gross per 24 hour  Intake 2410 ml  Output 1830 ml  Net 580 ml    Intake/Output this shift: No intake/output data recorded.  EXAM: General - Patient is Alert, Appropriate and Oriented Extremity - Neurovascular intact Sensation intact distally Intact pulses distally Dorsiflexion/Plantar flexion intact Dressing - clean, dry, no drainage Motor Function - intact, moving foot and toes well on exam.   Assessment/Plan: 1 Day Post-Op Procedure(s) (LRB): Right hip gluteal tendon repair (Right) Procedure(s) (LRB): Right hip gluteal tendon repair (Right) Past Medical History:  Diagnosis Date  . Chronic lower back pain   . Complication of anesthesia 01/2017   was in ICU due to breathing complications-  . COPD (chronic obstructive pulmonary disease) ([place])   . Depression   . Dyspnea    on exertion  . GERD (gastroesophageal reflux disease)   . H/O seasonal allergies   . High cholesterol   . 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 History of kidney stones   . Hypertension    . 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 OSA on CPAP   . Pneumonia    "I've had it several times; last time was end of Jan 2018" (05/02/2017)  . Prostate cancer ([place])   . Rheumatoid arteritis    "all over" (05/02/2017)   Active Problems:   Tendinopathy of right gluteus medius  Estimated body mass index is 35.43 kg/m as calculated from the following:   Height as of this encounter: _0  (1.727 m).   Weight as of this encounter: 105.7 kg (233 lb 0.4 oz). Advance diet Up with therapy Discharge home Diet - Cardiac diet Follow up - in two weeks. Call office for appointment at [phone]. Activity - Weight bearing as tolerated to the surgical leg.  No active abduction of the leg, (no pulling leg out to the side away from the body).  Walker for first several days until comfortable ambulating. May start showering three days following surgery but do not submerge incision under water. Continue to use ice for pain and swelling from surgery.  Full dose enteric-coated 325 mg Aspirin daily for three weeks, then switch to a Baby Aspirin 81 mg daily for three additional weeks.  Please use walker for the first couple of days until comfortable ambulating.  May start changing dressing tomorrow with dry gauze and tape.  Disposition - Home Condition Upon Discharge - Stable D/C Meds - See DC Summary DVT Prophylaxis - Aspirin  Arlee Muslim, PA-C Orthopaedic Surgery 11/03/2017, 8:12 AM

## 2017-11-03 NOTE — Progress Notes (Signed)
[name] to be D/C'd Home per MD order. Discussed with the patient and all questions fully answered.    VVS, Skin clean, dry and intact without evidence of skin break down, no evidence of skin tears noted.  IV catheter discontinued intact. Site without signs and symptoms of complications. Dressing and pressure applied.  An After Visit Summary was printed and given to the patient.  Patient escorted via [place], and D/C home via private auto.  Cyndra Numbers  11/03/2017 1:29 PM

## 2017-11-14 ENCOUNTER — Ambulatory Visit: Payer: Medicare Other | Admitting: Infectious Disease

## 2017-12-07 ENCOUNTER — Encounter: Payer: Self-pay | Admitting: Infectious Disease

## 2017-12-07 ENCOUNTER — Ambulatory Visit (INDEPENDENT_AMBULATORY_CARE_PROVIDER_SITE_OTHER): Payer: Medicare Other | Admitting: Infectious Disease

## 2017-12-07 VITALS — BP 124/82 | HR 90 | Temp 97.9°F | Ht 67.5 in | Wt 236.0 lb

## 2017-12-07 DIAGNOSIS — T847XXD Infection and inflammatory reaction due to other internal orthopedic prosthetic devices, implants and grafts, subsequent encounter: Secondary | ICD-10-CM

## 2017-12-07 DIAGNOSIS — M0579 Rheumatoid arthritis with rheumatoid factor of multiple sites without organ or systems involvement: Secondary | ICD-10-CM

## 2017-12-07 DIAGNOSIS — Z9081 Acquired absence of spleen: Secondary | ICD-10-CM

## 2017-12-07 DIAGNOSIS — B370 Candidal stomatitis: Secondary | ICD-10-CM | POA: Diagnosis not present

## 2017-12-07 DIAGNOSIS — A498 Other bacterial infections of unspecified site: Secondary | ICD-10-CM | POA: Diagnosis not present

## 2017-12-07 DIAGNOSIS — M6798 Unspecified disorder of synovium and tendon, other site: Secondary | ICD-10-CM | POA: Diagnosis not present

## 2017-12-07 DIAGNOSIS — M4620 Osteomyelitis of vertebra, site unspecified: Secondary | ICD-10-CM | POA: Diagnosis not present

## 2017-12-07 DIAGNOSIS — M67951 Unspecified disorder of synovium and tendon, right thigh: Secondary | ICD-10-CM

## 2017-12-07 HISTORY — DX: Candidal stomatitis: B37.0

## 2017-12-07 MED ORDER — FLUCONAZOLE 100 MG PO TABS: 100.0000 mg | ORAL_TABLET | Freq: Every day | ORAL | 3 refills | Status: DC

## 2017-12-07 NOTE — Progress Notes (Signed)
Subjective:   Chief complaint: hip pain, back pain sores in his mouth    [name], male    DOB: July 05, 1941, 77 y.o.   MRN: [id number]  HPI  77 y.o. male with RA previously on Rem[employer] with recent lumbar surgery with hardware placement complicated by CSF leak and deep infection with pseudomonas.  The patient had ESR, CRP have come down.  I last saw him he was having more significant hip pain than back pain.  He was subsequently seen by Dr. Maureen Ralphs who performed an MRI which showed evidence of gluteal tendon pathology.  There is also a fluid collection which was apparently aspirated and sent for culture which did not reveal any organisms noted she he was on ciprofloxacin when this occurred.  He was taken to the operating room by Dr. Maureen Ralphs who performed right gluteal tendon repair.  We discussed the fact that flora quinolones have been associated with tendinopathy but is my understanding that his gluteal tendon pathology was not thought to be related to his antimicrobials but more likely due to his prior hip replacement procedure.  He has been suffering from some sores in his mouth at times and on exam does have several sores along with  obvious thrush   Past Medical History:  Diagnosis Date  . Chronic lower back pain   . Complication of anesthesia 01/2017   was in ICU due to breathing complications-  . COPD (chronic obstructive pulmonary disease) ([place])   . Depression   . Dyspnea    on exertion  . GERD (gastroesophageal reflux disease)   . H/O seasonal allergies   . High cholesterol   . 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 History of kidney stones   . Hypertension   . 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 OSA on CPAP   . Pneumonia    "I've had it several times; last time was end of Jan 2018" (05/02/2017)  . Prostate cancer ([place])   . Rheumatoid arteritis    "all over" (05/02/2017)    Past  Surgical History:  Procedure Laterality Date  . BACK SURGERY    . COLONOSCOPY W/ POLYPECTOMY    . FRACTURE SURGERY    . HERNIA REPAIR    . HIP ARTHROPLASTY Left 04/2016  . INGUINAL HERNIA REPAIR Left 1986  . JOINT REPLACEMENT    . LUMBAR WOUND DEBRIDEMENT N/A 04/18/2017   Procedure: REPAIR OF LUMBAR PSEUDOMENINGOCELE;  Surgeon: Newman Pies, MD;  Location: [facility];  Service: Neurosurgery;  Laterality: N/A;  REAIR OF LUMBAR PSEUDOMENINGOCELE  . LUMBAR WOUND DEBRIDEMENT N/A 05/05/2017   Procedure: I&D Lumbar Wound;  Surgeon: Newman Pies, MD;  Location: [facility];  Service: Neurosurgery;  Laterality: N/A;  . MAXIMUM ACCESS (MAS)POSTERIOR LUMBAR INTERBODY FUSION (PLIF) 3 LEVEL  02/17/2017   [name] 02/17/2017  . OPEN SURGICAL REPAIR OF GLUTEAL TENDON Right 11/02/2017   Procedure: Right hip gluteal tendon repair;  Surgeon: Gaynelle Arabian, MD;  Location: [facility];  Service: Orthopedics;  Laterality: Right;  . POSTERIOR LAMINECTOMY / DECOMPRESSION LUMBAR SPINE  2010  . PROSTATE BIOPSY    . PROSTATE BIOPSY  <2013 X 3  . SPLENECTOMY  1955  . TONSILLECTOMY    . TOTAL HIP ARTHROPLASTY Right 10/2015  . UMBILICAL HERNIA REPAIR  05/2016    Family History  Problem Relation Age of Onset  . Cancer Mother   . Heart disease Father  Social History   Socioeconomic History  . Marital status: Married    Spouse name: Not on file  . Number of children: Not on file  . Years of education: Not on file  . Highest education level: Not on file  Social Needs  . Financial resource strain: Not on file  . Food insecurity - worry: Not on file  . Food insecurity - inability: Not on file  . Transportation needs - medical: Not on file  . Transportation needs - non-medical: Not on file  Occupational History  . Not on file  Tobacco Use  . Smoking status: Former Smoker    Packs/day: 1.50    Years: 54.00    Pack years: 81.00    Types: Cigarettes    Last attempt to quit: 09/27/2014    Years since quitting:  3.1  . Smokeless tobacco: Never Used  . Tobacco comment: 05/02/2017 "quit vaping 09/2016"  Substance and Sexual Activity  . Alcohol use: Yes    Alcohol/week: 10.2 oz    Types: 7 Cans of beer, 10 Shots of liquor per week    Comment: Daily  . Drug use: No  . Sexual activity: Not Currently  Other Topics Concern  . Not on file  Social History Narrative  . Not on file    Allergies  Allergen Reactions  . Sulfa Antibiotics Shortness Of Breath  . Varenicline Other (See Comments)    Strange thoughts     Current Outpatient Medications:  .  albuterol (PROVENTIL HFA;VENTOLIN HFA) 108 (90 Base) MCG/ACT inhaler, Inhale 2 puffs into the lungs every 6 (six) hours as needed for wheezing or shortness of breath., Disp: , Rfl:  .  aspirin EC 325 MG EC tablet, Take 1 tablet (325 mg total) by mouth daily. Full dose enteric-coated 325 mg Aspirin daily for three weeks, then switch to a Baby Aspirin 81 mg daily for three additional weeks., Disp: 21 tablet, Rfl: 0 .  ciprofloxacin (CIPRO) 750 MG tablet, Take 1 tablet (750 mg total) 2 (two) times daily by mouth., Disp: 180 tablet, Rfl: 1 .  DULoxetine (CYMBALTA) 60 MG capsule, Take 60 mg by mouth every evening. , Disp: , Rfl:  .  famotidine (PEPCID) 40 MG tablet, Take 1 tablet (40 mg total) by mouth daily. (Patient taking differently: Take 40 mg by mouth at bedtime. ), Disp: 30 tablet, Rfl: 6 .  finasteride (PROSCAR) 5 MG tablet, Take 5 mg by mouth at bedtime. , Disp: , Rfl:  .  fluticasone (FLONASE) 50 MCG/ACT nasal spray, Place 2 sprays into both nostrils daily as needed for allergies. , Disp: , Rfl:  .  fluticasone furoate-vilanterol (BREO ELLIPTA) 100-25 MCG/INH AEPB, Inhale 1 puff into the lungs daily., Disp: 3 each, Rfl: 3 .  folic acid (FOLVITE) 1 MG tablet, Take 1 mg by mouth daily., Disp: , Rfl:  .  gabapentin (NEURONTIN) 300 MG capsule, Take 600 mg by mouth 3 (three) times daily., Disp: , Rfl:  .  hydroxychloroquine (PLAQUENIL) 200 MG tablet, Take 200  mg by mouth 2 (two) times daily. , Disp: , Rfl:  .  lisinopril (PRINIVIL,ZESTRIL) 20 MG tablet, Take 20 mg by mouth at bedtime. , Disp: , Rfl:  .  meningococcal B (BEXSERO) SUSY injection, Inject 0.5 mLs into the muscle every 28 (twenty-eight) days. (Patient not taking: Reported on 10/14/2017), Disp: 0.5 mL, Rfl: 1 .  methocarbamol (ROBAXIN) 500 MG tablet, Take 1 tablet (500 mg total) by mouth every 6 (six) hours as needed  for muscle spasms., Disp: 80 tablet, Rfl: 0 .  methotrexate 2.5 MG tablet, Take 25 mg by mouth every Saturday. Saturday, Disp: , Rfl:  .  metoprolol succinate (TOPROL-XL) 25 MG 24 hr tablet, Take 25 mg by mouth at bedtime., Disp: , Rfl:  .  oxyCODONE (OXY IR/ROXICODONE) 5 MG immediate release tablet, Take 1 tablet (5 mg total) by mouth every 4 (four) hours as needed for moderate pain or severe pain., Disp: 60 tablet, Rfl: 0 .  predniSONE (DELTASONE) 5 MG tablet, Take 5 mg by mouth daily with breakfast., Disp: , Rfl:  .  sennosides-docusate sodium (SENOKOT-S) 8.6-50 MG tablet, Take 2 tablets by mouth at bedtime as needed (for constipation.). , Disp: , Rfl:  .  simvastatin (ZOCOR) 40 MG tablet, Take 40 mg by mouth at bedtime. , Disp: , Rfl:  .  sodium chloride (OCEAN) 0.65 % SOLN nasal spray, Place 1 spray into both nostrils as needed for congestion., Disp: , Rfl:  .  tamsulosin (FLOMAX) 0.4 MG CAPS capsule, Take 0.4 mg by mouth at bedtime. , Disp: , Rfl:  .  Tiotropium Bromide Monohydrate (SPIRIVA RESPIMAT) 2.5 MCG/ACT AERS, Inhale 2 puffs into the lungs daily., Disp: 3 Inhaler, Rfl: 3 .  traMADol (ULTRAM) 50 MG tablet, Take 50-100 mg by mouth every 12 (twelve) hours as needed (for pain.). , Disp: , Rfl:  .  traZODone (DESYREL) 50 MG tablet, Take 50 mg by mouth at bedtime., Disp: , Rfl:      Review of Systems  Constitutional: Negative for chills, diaphoresis and fever.  HENT: Positive for mouth sores and sore throat. Negative for congestion, hearing loss and tinnitus.     Respiratory: Negative for cough, shortness of breath and wheezing.   Cardiovascular: Negative for chest pain, palpitations and leg swelling.  Gastrointestinal: Negative for abdominal pain, blood in stool, constipation, diarrhea, nausea and vomiting.  Genitourinary: Negative for dysuria, flank pain and hematuria.  Musculoskeletal: Positive for arthralgias, back pain and myalgias.  Skin: Negative for rash.  Neurological: Negative for dizziness, weakness and headaches.  Hematological: Does not bruise/bleed easily.  Psychiatric/Behavioral: Negative for suicidal ideas. The patient is not nervous/anxious.        Objective:   Physical Exam  Constitutional: He is oriented to person, place, and time. He appears well-developed and well-nourished. No distress.  HENT:  Head: Normocephalic and atraumatic.  Mouth/Throat: Oropharyngeal exudate present.    Eyes: Conjunctivae and EOM are normal. No scleral icterus.  Neck: Normal range of motion. Neck supple.  Cardiovascular: Normal rate and regular rhythm.  Pulmonary/Chest: Effort normal. No respiratory distress. He has no wheezes.  Abdominal: He exhibits no distension.  Musculoskeletal: He exhibits no edema or tenderness.  Neurological: He is alert and oriented to person, place, and time. He exhibits normal muscle tone. Coordination normal.  Skin: Skin is warm and dry. No rash noted. He is not diaphoretic. No erythema. No pallor.  Psychiatric: He has a normal mood and affect. His behavior is normal. Judgment and thought content normal.          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 Trudie Reed.  And remains on methotrexate along with prednisone and is avoiding TNF alpha blockers.    Gluteal tendon tear: This was thought to be due to context of him having had a hip replacement surgery and potential damage to the tendon at that time.  We did discuss the black box warning with regards to fluoroquinolone therapy. continue to monitor him closely  Oral sores: Some of this seems to be due to Candida I wonder if also could be related to methotrexate.  We will give him a fluconazole course of 13 days.  Note there is QT prolonging risk with both fluconazole and ciprofloxacin.  His EKG was done several months ago did not show significant QT prolongation.  There is also a interaction between azoles and statins will need to hold his simvastatin while he is taking fluconazole.  I spent greater than 25 minutes with the patient including greater than 50% of time in face to face counsel of the patient regarding the nature of this type of infection the risk benefit calculations that go into giving antimicrobial therapy for long periods of time, the balance between his immunosuppressive therapy that he needs for his RA and the risk of infection, and in coordination of his care.

## 2017-12-07 NOTE — Patient Instructions (Signed)
When taking the fluconazole please stop the simvastatin (zocor)

## 2017-12-08 LAB — C-REACTIVE PROTEIN: CRP: 1.8 mg/L (ref <8.0)

## 2017-12-08 LAB — BASIC METABOLIC PANEL WITH GFR
BUN: 19 mg/dL (ref 7–25)
CALCIUM: 8.9 mg/dL (ref 8.6–10.3)
CO2: 28 mmol/L (ref 20–32)
CREATININE: 1 mg/dL (ref 0.70–1.18)
Chloride: 102 mmol/L (ref 98–110)
GFR, Est African American: 85 mL/min/{1.73_m2} (ref >=60)
GFR, Est Non African American: 73 mL/min/{1.73_m2} (ref >=60)
GLUCOSE: 98 mg/dL (ref 65–99)
POTASSIUM: 4.6 mmol/L (ref 3.5–5.3)
SODIUM: 137 mmol/L (ref 135–146)

## 2017-12-08 LAB — CBC WITH DIFFERENTIAL/PLATELET
BASOS ABS: 56 {cells}/uL (ref 0–200)
Basophils Relative: 0.5 %
Eosinophils Absolute: 246 cells/uL (ref 15–500)
Eosinophils Relative: 2.2 %
HCT: 38.4 % — ABNORMAL LOW (ref 38.5–50.0)
Hemoglobin: 13.4 g/dL (ref 13.2–17.1)
Lymphs Abs: 2195 cells/uL (ref 850–3900)
MCH: 35.2 pg — ABNORMAL HIGH (ref 27.0–33.0)
MCHC: 34.9 g/dL (ref 32.0–36.0)
MCV: 100.8 fL — ABNORMAL HIGH (ref 80.0–100.0)
MONOS PCT: 16.2 %
MPV: 9 fL (ref 7.5–12.5)
NEUTROS PCT: 61.5 %
Neutro Abs: 6888 cells/uL (ref 1500–7800)
PLATELETS: 263 10*3/uL (ref 140–400)
RBC: 3.81 10*6/uL — AB (ref 4.20–5.80)
RDW: 14.2 % (ref 11.0–15.0)
TOTAL LYMPHOCYTE: 19.6 %
WBC mixed population: 1814 cells/uL — ABNORMAL HIGH (ref 200–950)
WBC: 11.2 10*3/uL — AB (ref 3.8–10.8)

## 2017-12-08 LAB — SEDIMENTATION RATE: SED RATE: 9 mm/h (ref 0–20)

## 2017-12-19 ENCOUNTER — Ambulatory Visit: Payer: Medicare Other

## 2017-12-19 DIAGNOSIS — Z87891 Personal history of nicotine dependence: Secondary | ICD-10-CM

## 2017-12-21 ENCOUNTER — Other Ambulatory Visit: Payer: Self-pay | Admitting: Acute Care

## 2017-12-21 DIAGNOSIS — Z87891 Personal history of nicotine dependence: Secondary | ICD-10-CM

## 2017-12-21 DIAGNOSIS — Z122 Encounter for screening for malignant neoplasm of respiratory organs: Secondary | ICD-10-CM

## 2018-03-20 ENCOUNTER — Ambulatory Visit (INDEPENDENT_AMBULATORY_CARE_PROVIDER_SITE_OTHER): Payer: Medicare Other | Admitting: Infectious Disease

## 2018-03-20 ENCOUNTER — Encounter: Payer: Self-pay | Admitting: Infectious Disease

## 2018-03-20 VITALS — BP 141/93 | HR 93 | Temp 97.9°F | Ht 68.0 in | Wt 234.0 lb

## 2018-03-20 DIAGNOSIS — T847XXD Infection and inflammatory reaction due to other internal orthopedic prosthetic devices, implants and grafts, subsequent encounter: Secondary | ICD-10-CM

## 2018-03-20 DIAGNOSIS — Z9081 Acquired absence of spleen: Secondary | ICD-10-CM

## 2018-03-20 DIAGNOSIS — M4620 Osteomyelitis of vertebra, site unspecified: Secondary | ICD-10-CM

## 2018-03-20 DIAGNOSIS — B370 Candidal stomatitis: Secondary | ICD-10-CM

## 2018-03-20 DIAGNOSIS — M6798 Unspecified disorder of synovium and tendon, other site: Secondary | ICD-10-CM

## 2018-03-20 DIAGNOSIS — T84030D 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, subsequent encounter: Secondary | ICD-10-CM

## 2018-03-20 DIAGNOSIS — A498 Other bacterial infections of unspecified site: Secondary | ICD-10-CM | POA: Diagnosis not present

## 2018-03-20 DIAGNOSIS — M0579 Rheumatoid arthritis with rheumatoid factor of multiple sites without organ or systems involvement: Secondary | ICD-10-CM

## 2018-03-20 DIAGNOSIS — M67951 Unspecified disorder of synovium and tendon, right thigh: Secondary | ICD-10-CM

## 2018-03-20 NOTE — Progress Notes (Signed)
Subjective:   Chief complaint: still having pain related to gluteal tendon problem and RA pain in hands is worse, also fatigued    [name], male    DOB: [birth year], 77 y.o.   MRN: [id number]  HPI  77 y.o. male with RA previously on Rem[employer] with recent lumbar surgery with hardware placement complicated by CSF leak and deep infection with pseudomonas.  The patient had ESR, CRP have come down.  I last saw him he was having more significant hip pain than back pain.  He was subsequently seen by Dr. Maureen Ralphs who performed an MRI which showed evidence of gluteal tendon pathology.  There is also a fluid collection which was apparently aspirated and sent for culture which did not reveal any organisms noted she he was on ciprofloxacin when this occurred.  He was taken to the operating room by Dr. Maureen Ralphs who performed right gluteal tendon repair.  We had discussed the fact that flora quinolones have been associated with tendinopathy but is my understanding that his gluteal tendon pathology was not thought to be related to his antimicrobials but more likely due to his prior hip replacement procedure.  He had mouth sores at last visit that we treated presumptively as thrush.  He has very little back pain but still sig pain in buttocks and with hip, also RA flaring a bit in hands with him having dropped prednisone dose month ago (going back up on it). He also c/o lack of energy.   He asks if he could go back on Remicaid but I think that is not a wise move to make in near future.   Past Medical History:  Diagnosis Date  . Chronic lower back pain   . Complication of anesthesia 01/2017   was in ICU due to breathing complications-  . COPD (chronic obstructive pulmonary disease) ([place])   . Depression   . Dyspnea    on exertion  . GERD (gastroesophageal reflux disease)   . H/O seasonal allergies   . High cholesterol   . History of blood transfusion    "probably w/splenectomy"    . History of kidney stones   . Hypertension   . 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 OSA on CPAP   . Pneumonia    "I've had it several times; last time was end of Jan 2018" (05/02/2017)  . Prostate cancer ([place])   . Rheumatoid arteritis    "all over" (05/02/2017)  . Thrush 12/07/2017    Past Surgical History:  Procedure Laterality Date  . BACK SURGERY    . COLONOSCOPY W/ POLYPECTOMY    . FRACTURE SURGERY    . HERNIA REPAIR    . HIP ARTHROPLASTY Left 04/2016  . INGUINAL HERNIA REPAIR Left 1986  . JOINT REPLACEMENT    . LUMBAR WOUND DEBRIDEMENT N/A 04/18/2017   Procedure: REPAIR OF LUMBAR PSEUDOMENINGOCELE;  Surgeon: Newman Pies, MD;  Location: [facility];  Service: Neurosurgery;  Laterality: N/A;  REAIR OF LUMBAR PSEUDOMENINGOCELE  . LUMBAR WOUND DEBRIDEMENT N/A 05/05/2017   Procedure: I&D Lumbar Wound;  Surgeon: Newman Pies, MD;  Location: [facility];  Service: Neurosurgery;  Laterality: N/A;  . MAXIMUM ACCESS (MAS)POSTERIOR LUMBAR INTERBODY FUSION (PLIF) 3 LEVEL  02/17/2017   [name] 02/17/2017  . OPEN SURGICAL REPAIR OF GLUTEAL TENDON Right 11/02/2017   Procedure: Right hip gluteal tendon repair;  Surgeon: Gaynelle Arabian, MD;  Location: [facility];  Service: Orthopedics;  Laterality: Right;  .  POSTERIOR LAMINECTOMY / DECOMPRESSION LUMBAR SPINE  2010  . PROSTATE BIOPSY    . PROSTATE BIOPSY  <2013 X 3  . SPLENECTOMY  1955  . TONSILLECTOMY    . TOTAL HIP ARTHROPLASTY Right 10/2015  . UMBILICAL HERNIA REPAIR  05/2016    Family History  Problem Relation Age of Onset  . Cancer Mother   . Heart disease Father       Social History   Socioeconomic History  . Marital status: Married    Spouse name: Not on file  . Number of children: Not on file  . Years of education: Not on file  . Highest education level: Not on file  Occupational History  . Not on file  Social Needs  . Financial resource strain: Not on file  . Food insecurity:    Worry:  Not on file    Inability: Not on file  . Transportation needs:    Medical: Not on file    Non-medical: Not on file  Tobacco Use  . Smoking status: Former Smoker    Packs/day: 1.50    Years: 54.00    Pack years: 81.00    Types: Cigarettes    Last attempt to quit: 09/27/2014    Years since quitting: 3.4  . Smokeless tobacco: Never Used  . Tobacco comment: 05/02/2017 "quit vaping 09/2016"  Substance and Sexual Activity  . Alcohol use: Yes    Alcohol/week: 10.2 oz    Types: 7 Cans of beer, 10 Shots of liquor per week    Comment: Daily  . Drug use: No  . Sexual activity: Not Currently  Lifestyle  . Physical activity:    Days per week: Not on file    Minutes per session: Not on file  . Stress: Not on file  Relationships  . Social connections:    Talks on phone: Not on file    Gets together: Not on file    Attends religious service: Not on file    Active member of club or organization: Not on file    Attends meetings of clubs or organizations: Not on file    Relationship status: Not on file  Other Topics Concern  . Not on file  Social History Narrative  . Not on file    Allergies  Allergen Reactions  . Sulfa Antibiotics Shortness Of Breath  . Sulfasalazine Shortness Of Breath  . Varenicline Other (See Comments)    Strange thoughts     Current Outpatient Medications:  .  albuterol (PROVENTIL HFA;VENTOLIN HFA) 108 (90 Base) MCG/ACT inhaler, Inhale 2 puffs into the lungs every 6 (six) hours as needed for wheezing or shortness of breath., Disp: , Rfl:  .  ciprofloxacin (CIPRO) 750 MG tablet, Take 1 tablet (750 mg total) 2 (two) times daily by mouth., Disp: 180 tablet, Rfl: 1 .  DULoxetine (CYMBALTA) 60 MG capsule, Take 60 mg by mouth every evening. , Disp: , Rfl:  .  famotidine (PEPCID) 40 MG tablet, Take 1 tablet (40 mg total) by mouth daily. (Patient taking differently: Take 40 mg by mouth at bedtime. ), Disp: 30 tablet, Rfl: 6 .  finasteride (PROSCAR) 5 MG tablet, Take 5 mg  by mouth at bedtime. , Disp: , Rfl:  .  fluconazole (DIFLUCAN) 100 MG tablet, Take 1 tablet (100 mg total) by mouth daily., Disp: 14 tablet, Rfl: 3 .  fluticasone (FLONASE) 50 MCG/ACT nasal spray, Place 2 sprays into both nostrils daily as needed for allergies. , Disp: , Rfl:  .  fluticasone furoate-vilanterol (BREO ELLIPTA) 100-25 MCG/INH AEPB, Inhale 1 puff into the lungs daily., Disp: 3 each, Rfl: 3 .  folic acid (FOLVITE) 1 MG tablet, Take 1 mg by mouth daily., Disp: , Rfl:  .  gabapentin (NEURONTIN) 300 MG capsule, Take 600 mg by mouth 3 (three) times daily., Disp: , Rfl:  .  hydroxychloroquine (PLAQUENIL) 200 MG tablet, Take 200 mg by mouth 2 (two) times daily. , Disp: , Rfl:  .  lisinopril (PRINIVIL,ZESTRIL) 20 MG tablet, Take 20 mg by mouth at bedtime. , Disp: , Rfl:  .  meningococcal B (BEXSERO) SUSY injection, Inject 0.5 mLs into the muscle every 28 (twenty-eight) days., Disp: 0.5 mL, Rfl: 1 .  methotrexate 2.5 MG tablet, Take 25 mg by mouth every Saturday. Saturday, Disp: , Rfl:  .  metoprolol succinate (TOPROL-XL) 25 MG 24 hr tablet, Take 25 mg by mouth at bedtime., Disp: , Rfl:  .  predniSONE (DELTASONE) 5 MG tablet, Take 5 mg by mouth daily with breakfast., Disp: , Rfl:  .  sennosides-docusate sodium (SENOKOT-S) 8.6-50 MG tablet, Take 2 tablets by mouth at bedtime as needed (for constipation.). , Disp: , Rfl:  .  simvastatin (ZOCOR) 40 MG tablet, Take 40 mg by mouth at bedtime. , Disp: , Rfl:  .  sodium chloride (OCEAN) 0.65 % SOLN nasal spray, Place 1 spray into both nostrils as needed for congestion., Disp: , Rfl:  .  tamsulosin (FLOMAX) 0.4 MG CAPS capsule, Take 0.4 mg by mouth at bedtime. , Disp: , Rfl:  .  Tiotropium Bromide Monohydrate (SPIRIVA RESPIMAT) 2.5 MCG/ACT AERS, Inhale 2 puffs into the lungs daily., Disp: 3 Inhaler, Rfl: 3 .  traMADol (ULTRAM) 50 MG tablet, Take 50-100 mg by mouth every 12 (twelve) hours as needed (for pain.). , Disp: , Rfl:  .  traZODone (DESYREL) 50  MG tablet, Take 50 mg by mouth at bedtime., Disp: , Rfl:  .  aspirin EC 325 MG EC tablet, Take 1 tablet (325 mg total) by mouth daily. Full dose enteric-coated 325 mg Aspirin daily for three weeks, then switch to a Baby Aspirin 81 mg daily for three additional weeks. (Patient not taking: Reported on 03/20/2018), Disp: 21 tablet, Rfl: 0 .  methocarbamol (ROBAXIN) 500 MG tablet, Take 1 tablet (500 mg total) by mouth every 6 (six) hours as needed for muscle spasms. (Patient not taking: Reported on 03/20/2018), Disp: 80 tablet, Rfl: 0 .  oxyCODONE (OXY IR/ROXICODONE) 5 MG immediate release tablet, Take 1 tablet (5 mg total) by mouth every 4 (four) hours as needed for moderate pain or severe pain. (Patient not taking: Reported on 03/20/2018), Disp: 60 tablet, Rfl: 0     Review of Systems  Constitutional: Positive for fatigue. Negative for chills, diaphoresis and fever.  HENT: Negative for congestion, hearing loss and tinnitus.   Respiratory: Negative for cough, shortness of breath and wheezing.   Cardiovascular: Negative for chest pain, palpitations and leg swelling.  Gastrointestinal: Negative for abdominal pain, blood in stool, constipation, diarrhea, nausea and vomiting.  Genitourinary: Negative for dysuria, flank pain and hematuria.  Musculoskeletal: Positive for arthralgias, back pain and myalgias.  Skin: Negative for rash.  Neurological: Negative for dizziness, weakness and headaches.  Hematological: Does not bruise/bleed easily.  Psychiatric/Behavioral: Positive for dysphoric mood. Negative for suicidal ideas. The patient is not nervous/anxious.        Objective:   Physical Exam  Constitutional: He is oriented to person, place, and time. He appears well-developed and well-nourished. No  distress.  HENT:  Head: Normocephalic and atraumatic.  Eyes: Conjunctivae and EOM are normal. No scleral icterus.  Neck: Normal range of motion. Neck supple.  Cardiovascular: Normal rate and regular  rhythm.  Pulmonary/Chest: Effort normal. No respiratory distress. He has no wheezes.  Abdominal: He exhibits no distension.  Musculoskeletal: He exhibits no edema or tenderness.  Neurological: He is alert and oriented to person, place, and time. He exhibits normal muscle tone. Coordination normal.  Skin: Skin is warm and dry. No rash noted. He is not diaphoretic. No erythema. No pallor.  Psychiatric: He has a normal mood and affect. His behavior is normal. Judgment and thought content normal.          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 Continue Cipro and rtc in September  RA: he is seeing Dr. Trudie Reed.  And remains on methotrexate along with prednisone and is avoiding TNF alpha blockers. I STRONGLY REC CONTINUED AVOIDANCE of them   Gluteal tendon tear: This was thought to be due to context of him having had a hip replacement surgery and potential damage to the tendon at that time.    I spent greater than 25 minutes with the patient including greater than 50% of time in face to face counsel of the patient re nature of deep infections, in particular the challenges of hardware associated infections in context of immunosuppression and in coordination of  his care.

## 2018-03-21 LAB — BASIC METABOLIC PANEL WITH GFR
BUN: 17 mg/dL (ref 7–25)
CALCIUM: 8.9 mg/dL (ref 8.6–10.3)
CO2: 28 mmol/L (ref 20–32)
Chloride: 102 mmol/L (ref 98–110)
Creat: 0.89 mg/dL (ref 0.70–1.18)
GFR, EST AFRICAN AMERICAN: 97 mL/min/{1.73_m2} (ref >=60)
GFR, EST NON AFRICAN AMERICAN: 84 mL/min/{1.73_m2} (ref >=60)
Glucose, Bld: 110 mg/dL — ABNORMAL HIGH (ref 65–99)
Potassium: 4.3 mmol/L (ref 3.5–5.3)
Sodium: 139 mmol/L (ref 135–146)

## 2018-03-21 LAB — C-REACTIVE PROTEIN: CRP: 1 mg/L (ref <8.0)

## 2018-03-21 LAB — SEDIMENTATION RATE: Sed Rate: 6 mm/h (ref 0–20)

## 2018-03-23 ENCOUNTER — Encounter: Payer: Self-pay | Admitting: Infectious Disease

## 2018-04-13 ENCOUNTER — Other Ambulatory Visit: Payer: Self-pay | Admitting: Infectious Disease

## 2018-04-13 DIAGNOSIS — T847XXD Infection and inflammatory reaction due to other internal orthopedic prosthetic devices, implants and grafts, subsequent encounter: Secondary | ICD-10-CM

## 2018-05-22 ENCOUNTER — Telehealth: Payer: Self-pay | Admitting: Pulmonary Disease

## 2018-05-22 NOTE — Telephone Encounter (Signed)
Pt is calling back [phone]

## 2018-05-22 NOTE — Telephone Encounter (Signed)
Called patient unable to reach left message to give [place] a call back.

## 2018-05-22 NOTE — Telephone Encounter (Signed)
Can send script for augmentin 875, one pill bid, #14 with no refills.  If not feeling better in next few days, then needs ROV with NP.

## 2018-05-22 NOTE — Telephone Encounter (Signed)
Called and spoke with patient, he states that he is not feeling well, patient is having increased SOB, chest congestion, productive cough with dark yellow mucous. Patient denies fever, N/V, sneezing.    BQ please advise, thank you.

## 2018-05-22 NOTE — Telephone Encounter (Signed)
BQ is out of the office today, routing message to VS as he is DOD this afternoon.  Called and spoke with patient, he states that he is not feeling well, patient is having increased SOB, chest congestion, productive cough with dark yellow mucous. Patient denies fever, N/V, sneezing.    VS please advise, thank you.

## 2018-05-22 NOTE — Telephone Encounter (Signed)
Attempted to call patient today regarding message not feeling well with SOB and coughing. X1 Will hold in triage box until we get a hold of pt.

## 2018-05-23 MED ORDER — AMOXICILLIN-POT CLAVULANATE 875-125 MG PO TABS: 1.0000 | ORAL_TABLET | Freq: Two times a day (BID) | ORAL | 0 refills | Status: DC

## 2018-05-23 NOTE — Telephone Encounter (Signed)
I have called the patient and verified the pharmacy and have sent in the script to the preferred pharmacy. Nothing further needed at this time.

## 2018-05-23 NOTE — Telephone Encounter (Signed)
Pt is returning call.Cb is [phone]. Pharm [facility]

## 2018-05-23 NOTE — Telephone Encounter (Signed)
Pt calling back and states [facility] [address] in [place].

## 2018-05-30 ENCOUNTER — Encounter: Payer: Self-pay | Admitting: Pulmonary Disease

## 2018-05-30 ENCOUNTER — Ambulatory Visit (INDEPENDENT_AMBULATORY_CARE_PROVIDER_SITE_OTHER)
Admission: RE | Admit: 2018-05-30 | Discharge: 2018-05-30 | Disposition: A | Discharge status: Home / Self Care | Payer: Medicare Other | Source: Ambulatory Visit | Attending: Pulmonary Disease | Admitting: Pulmonary Disease

## 2018-05-30 ENCOUNTER — Ambulatory Visit: Payer: Medicare Other | Admitting: Pulmonary Disease

## 2018-05-30 VITALS — BP 140/80 | HR 101 | Ht 68.0 in | Wt 234.6 lb

## 2018-05-30 DIAGNOSIS — J0100 Acute maxillary sinusitis, unspecified: Secondary | ICD-10-CM | POA: Diagnosis not present

## 2018-05-30 DIAGNOSIS — G4733 Obstructive sleep apnea (adult) (pediatric): Secondary | ICD-10-CM | POA: Diagnosis not present

## 2018-05-30 DIAGNOSIS — J42 Unspecified chronic bronchitis: Secondary | ICD-10-CM

## 2018-05-30 DIAGNOSIS — Z9989 Dependence on other enabling machines and devices: Secondary | ICD-10-CM | POA: Diagnosis not present

## 2018-05-30 DIAGNOSIS — J019 Acute sinusitis, unspecified: Secondary | ICD-10-CM | POA: Insufficient documentation

## 2018-05-30 MED ORDER — PREDNISONE 10 MG PO TABS: ORAL_TABLET | ORAL | 0 refills | Status: DC

## 2018-05-30 MED ORDER — BUDESONIDE-FORMOTEROL FUMARATE 160-4.5 MCG/ACT IN AERO: 2.0000 | INHALATION_SPRAY | Freq: Two times a day (BID) | RESPIRATORY_TRACT | 0 refills | Status: DC

## 2018-05-30 MED ORDER — TIOTROPIUM BROMIDE MONOHYDRATE 2.5 MCG/ACT IN AERS: 1.0000 | INHALATION_SPRAY | Freq: Every day | RESPIRATORY_TRACT | 0 refills | Status: DC

## 2018-05-30 MED ORDER — FLUTICASONE FUROATE-VILANTEROL 100-25 MCG/INH IN AEPB: 1.0000 | INHALATION_SPRAY | Freq: Every day | RESPIRATORY_TRACT | 0 refills | Status: DC

## 2018-05-30 MED ORDER — DOXYCYCLINE HYCLATE 100 MG PO TABS: 100.0000 mg | ORAL_TABLET | Freq: Two times a day (BID) | ORAL | 0 refills | Status: DC

## 2018-05-30 NOTE — Progress Notes (Signed)
Reviewed, agree 

## 2018-05-30 NOTE — Patient Instructions (Addendum)
Doxycycline >>> 1 100 mg tablet every 12 hours for 7 days >>>take with food  >>>wear sunscreen   Prednisone 48m tablet  >>>4 tabs for 2 days, then 3 tabs for 2 days, 2 tabs for 2 days, then 1 tab for 2 days, then stop >>>take with food  >>>take in the morning  >>> This replaces your daily 5 mg prednisone  Chest x-ray today  Breo Ellipta 100 >>> Take 1 puff daily in the morning right when you wake up >>>Rinse your mouth out after use >>>This is a daily maintenance inhaler, NOT a rescue inhaler >>>Contact our office if you are having difficulties affording or obtaining this medication >>>It is important for you to be able to take this daily and not miss any doses   Spiriva Respimat 2.5 >>> 2 puffs daily >>> Do this every day >>>This is not a rescue inhaler  We will hold off on flu vaccine at this time  Follow-up with our office in 2 to 4 weeks, let [facility] sooner if your symptoms are not improving  It is flu season:   >>>Remember to be washing your hands regularly, using hand sanitizer, be careful to use around herself with has contact with people who are sick will increase her chances of getting sick yourself. >>> Best ways to protect herself from the flu: Receive the yearly flu vaccine, practice good hand hygiene washing with soap and also using hand sanitizer when available, eat a nutritious meals, get adequate rest, hydrate appropriately       Please contact the office if your symptoms worsen or you have concerns that you are not improving.   Thank you for choosing [facility] for your healthcare, and for allowing [facility] partner with you on your healthcare journey. I am thankful to be able to provide care to you today.   BWyn QuakerFNP-C

## 2018-05-30 NOTE — Assessment & Plan Note (Signed)
Doxycycline >>> 1 100 mg tablet every 12 hours for 7 days >>>take with food  >>>wear sunscreen   Prednisone 65m tablet  >>>4 tabs for 2 days, then 3 tabs for 2 days, 2 tabs for 2 days, then 1 tab for 2 days, then stop >>>take with food  >>>take in the morning  >>> This replaces your daily 5 mg prednisone  Chest x-ray today

## 2018-05-30 NOTE — Assessment & Plan Note (Signed)
Doxycycline >>> 1 100 mg tablet every 12 hours for 7 days >>>take with food  >>>wear sunscreen   Prednisone 69m tablet  >>>4 tabs for 2 days, then 3 tabs for 2 days, 2 tabs for 2 days, then 1 tab for 2 days, then stop >>>take with food  >>>take in the morning  >>> This replaces your daily 5 mg prednisone  Chest x-ray today  Breo Ellipta 100 >>> Take 1 puff daily in the morning right when you wake up >>>Rinse your mouth out after use >>>This is a daily maintenance inhaler, NOT a rescue inhaler >>>Contact our office if you are having difficulties affording or obtaining this medication >>>It is important for you to be able to take this daily and not miss any doses   Spiriva Respimat 2.5 >>> 2 puffs daily >>> Do this every day >>>This is not a rescue inhaler  We will hold off on flu vaccine at this time  Follow-up with our office in 2 to 4 weeks, let [facility] sooner if your symptoms are not improving

## 2018-05-30 NOTE — Assessment & Plan Note (Signed)
We recommend that you continue using your CPAP daily >>>Keep up the hard work using your device >>> Goal should be wearing this for the entire night that you are sleeping, at least 4 to 6 hours  Remember:  . Do not drive or operate heavy machinery if tired or drowsy.  . Please notify the supply company and office if you are unable to use your device regularly due to missing supplies or machine being broken.  . Work on maintaining a healthy weight and following your recommended nutrition plan  . Maintain proper daily exercise and movement  . Maintaining proper use of your device can also help improve management of other chronic illnesses such as: Blood pressure, blood sugars, and weight management.   BiPAP/ CPAP Cleaning:  >>>Clean weekly, with Dawn soap, and bottle brush.  Set up to air dry.

## 2018-05-30 NOTE — Progress Notes (Addendum)
_0  ID: [name], male    DOB: [birth year], 77 y.o.   MRN: [id number]  Chief Complaint  Patient presents with  . Acute Visit    dyspnea    Referring provider: Javier Glazier, MD  HPI:  77 year old male former smoker referred to our office in 2018.  Managed in our office for COPD and obstructive sleep apnea (managed on CPAP).  PMH: Rheumatoid arthritis Smoker/ Smoking History: Former Smoker, Quit 2012, 81 pack years Maintenance:  Breo Ellipta 100, Spiriva 2.5 Pt of: Dr. Lake Bells  Recent [place] [facility] Encounters:   05/22/2018-telephone encounter-Augmentin twice daily for 7 days via telephone encounter  08/24/2017-office visit-[place]  Patient reports today that he is doing well.  Patient is adherent to new CPAP machine.  He is sleeping with it nightly.  Patient reports no issues with his COPD.  Patient is still using Breo and Spiriva.  Patient has received the flu vaccine Plan: Keep using CPAP, keep taking Breo and Spiriva, follow-up with [place] in 6 months patient received clearance for orthopedic surgery today  05/30/2018  - Visit   77 year old patient presenting today with increased shortness of breath for the past 2 to 3 weeks.  Patient reports she is been having dark yellow productive mucus.  Cough is occasionally productive, typically dry. Patient adherent to Breo Ellipta 100 and Spiriva 2.5.  Patient is requesting a refill of the [facility].  This is almost out of this.  Patient reports that pharmacy had recently sent this information to Dr. Anastasia Pall office.    Tests:   Pulmonary function testing: October 2015 from [facility] ratio 58% FEV1 1.54 L 57% predicted, FVC 2.65 L 70% predicted, total lung capacity 157% predicted residual volume 263% predicted DLCO 61% predicted  Chest imaging: March 2018 CT chest lung cancer screening: Rads 2, images independently reviewed showing nonspecific scarring more in the right lung base than the left, not consistent with  an interstitial lung disease 12/19/2017-CT chest lung cancer screening-lung RADS 2, follow-up in 12 months  Polysomnogram: >>>N. PSG on 04/25/2000 gave an index of 78/hr.  Ever since then he has  been using CPAP at [address]    Chart Review:     Specialty Problems      Pulmonary Problems   COPD (chronic obstructive pulmonary disease) (HCC)    Qualifier: Diagnosis of  By: Julien Girt CMA, Leigh        OSA on CPAP    07/2017 compliance report 93% > 4 hours a day      ALLERGIC RHINITIS    Qualifier: Diagnosis of  By: Annamaria Boots MD, Clinton D       Hypoxia   Acute sinusitis      Allergies  Allergen Reactions  . Sulfa Antibiotics Shortness Of Breath  . Sulfasalazine Shortness Of Breath  . Varenicline Other (See Comments)    Strange thoughts    Immunization History  Administered Date(s) Administered  . 19-influenza Whole 07/18/2012, 06/14/2014, 08/12/2015, 06/03/2016  . HiB (PRP-T) 08/23/2017  . Influenza,inj,Quad PF,6+ Mos 06/28/2017  . Influenza-Unspecified 07/18/2012, 06/14/2014, 08/12/2015, 06/03/2016, 06/28/2017  . Meningococcal B, OMV 08/23/2017, 09/21/2017  . Meningococcal Conjugate 08/23/2017  . Meningococcal Mcv4o 08/23/2017, 10/18/2017  . Pneumococcal Conjugate-13 11/28/2013, 11/28/2013  . Pneumococcal Polysaccharide-23 12/26/2005, 09/13/2012  . Pneumococcal-Unspecified 12/26/2005, 09/13/2012  . Tdap 11/28/2013, 11/28/2013  . Zoster 06/06/2012, 06/06/2012  Patient needs a flu vaccine.  Patient to receive at his nursing facility he lives that.  Past Medical History:  Diagnosis Date  .  Chronic lower back pain   . Complication of anesthesia 01/2017   was in ICU due to breathing complications-  . COPD (chronic obstructive pulmonary disease) ([place])   . Depression   . Dyspnea    on exertion  . GERD (gastroesophageal reflux disease)   . H/O seasonal allergies   . High cholesterol   . 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 History of kidney  stones   . Hypertension   . 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 OSA on CPAP   . Pneumonia    "I've had it several times; last time was end of Jan 2018" (05/02/2017)  . Prostate cancer ([place])   . Rheumatoid arteritis    "all over" (05/02/2017)  . Thrush 12/07/2017    Tobacco History: Social History   Tobacco Use  Smoking Status Former Smoker  . Packs/day: 1.50  . Years: 54.00  . Pack years: 81.00  . Types: Cigarettes  . Last attempt to quit: 09/27/2014  . Years since quitting: 3.6  Smokeless Tobacco Never Used  Tobacco Comment   05/02/2017 "quit vaping 09/2016"   Counseling given: Yes Comment: 05/02/2017 "quit vaping 09/2016" Continue not smoking.  Outpatient Encounter Medications as of 05/30/2018  Medication Sig  . albuterol (PROVENTIL HFA;VENTOLIN HFA) 108 (90 Base) MCG/ACT inhaler Inhale 2 puffs into the lungs every 6 (six) hours as needed for wheezing or shortness of breath.  Marland Kitchen amoxicillin-clavulanate (AUGMENTIN) 875-125 MG tablet Take 1 tablet by mouth 2 (two) times daily.  . ciprofloxacin (CIPRO) 750 MG tablet TAKE ONE TABLET BY MOUTH TWICE DAILY  . DULoxetine (CYMBALTA) 60 MG capsule Take 60 mg by mouth every evening.   . famotidine (PEPCID) 40 MG tablet Take 1 tablet (40 mg total) by mouth daily. (Patient taking differently: Take 40 mg by mouth at bedtime. )  . finasteride (PROSCAR) 5 MG tablet Take 5 mg by mouth at bedtime.   . fluconazole (DIFLUCAN) 100 MG tablet Take 1 tablet (100 mg total) by mouth daily.  . fluticasone (FLONASE) 50 MCG/ACT nasal spray Place 2 sprays into both nostrils daily as needed for allergies.   . fluticasone furoate-vilanterol (BREO ELLIPTA) 100-25 MCG/INH AEPB Inhale 1 puff into the lungs daily.  . folic acid (FOLVITE) 1 MG tablet Take 1 mg by mouth daily.  Marland Kitchen gabapentin (NEURONTIN) 300 MG capsule Take 600 mg by mouth 3 (three) times daily.  Marland Kitchen lisinopril (PRINIVIL,ZESTRIL) 20 MG tablet Take 20 mg by mouth at  bedtime.   . methotrexate 2.5 MG tablet Take 25 mg by mouth every Saturday. Saturday  . metoprolol succinate (TOPROL-XL) 25 MG 24 hr tablet Take 25 mg by mouth at bedtime.  . predniSONE (DELTASONE) 5 MG tablet Take 5 mg by mouth daily with breakfast.  . sennosides-docusate sodium (SENOKOT-S) 8.6-50 MG tablet Take 2 tablets by mouth at bedtime as needed (for constipation.).   Marland Kitchen simvastatin (ZOCOR) 40 MG tablet Take 40 mg by mouth at bedtime.   . sodium chloride (OCEAN) 0.65 % SOLN nasal spray Place 1 spray into both nostrils as needed for congestion.  . tamsulosin (FLOMAX) 0.4 MG CAPS capsule Take 0.4 mg by mouth at bedtime.   . Tiotropium Bromide Monohydrate (SPIRIVA RESPIMAT) 2.5 MCG/ACT AERS Inhale 2 puffs into the lungs daily.  . traZODone (DESYREL) 50 MG tablet Take 50 mg by mouth at bedtime.  . [DISCONTINUED] hydroxychloroquine (PLAQUENIL) 200 MG tablet Take 200 mg by mouth 2 (two) times  daily.   . [DISCONTINUED] meningococcal B (BEXSERO) SUSY injection Inject 0.5 mLs into the muscle every 28 (twenty-eight) days.  . [DISCONTINUED] methocarbamol (ROBAXIN) 500 MG tablet Take 1 tablet (500 mg total) by mouth every 6 (six) hours as needed for muscle spasms.  . [DISCONTINUED] oxyCODONE (OXY IR/ROXICODONE) 5 MG immediate release tablet Take 1 tablet (5 mg total) by mouth every 4 (four) hours as needed for moderate pain or severe pain.  . [DISCONTINUED] traMADol (ULTRAM) 50 MG tablet Take 50-100 mg by mouth every 12 (twelve) hours as needed (for pain.).   Marland Kitchen doxycycline (VIBRA-TABS) 100 MG tablet Take 1 tablet (100 mg total) by mouth 2 (two) times daily.  . fluticasone furoate-vilanterol (BREO ELLIPTA) 100-25 MCG/INH AEPB Inhale 1 puff into the lungs daily.  . predniSONE (DELTASONE) 10 MG tablet 4 tabs for 2 days, then 3 tabs for 2 days, 2 tabs for 2 days, then 1 tab for 2 days, then stop  . Tiotropium Bromide Monohydrate (SPIRIVA RESPIMAT) 2.5 MCG/ACT AERS Inhale 1 puff into the lungs daily.  .  [DISCONTINUED] aspirin EC 325 MG EC tablet Take 1 tablet (325 mg total) by mouth daily. Full dose enteric-coated 325 mg Aspirin daily for three weeks, then switch to a Baby Aspirin 81 mg daily for three additional weeks. (Patient not taking: Reported on 03/20/2018)  . [DISCONTINUED] budesonide-formoterol (SYMBICORT) 160-4.5 MCG/ACT inhaler Inhale 2 puffs into the lungs 2 (two) times daily.   No facility-administered encounter medications on file as of 05/30/2018.      Review of Systems  Review of Systems  Constitutional: Positive for fatigue. Negative for activity change, chills, fever and unexpected weight change.  HENT: Negative for congestion.   Respiratory: Positive for cough and shortness of breath. Negative for wheezing.   Cardiovascular: Positive for leg swelling (chronic ). Negative for chest pain and palpitations.  Gastrointestinal: Negative for constipation, diarrhea, nausea and vomiting.  Genitourinary: Negative for hematuria and urgency.  Musculoskeletal: Negative for arthralgias.  Skin: Negative for color change.  Neurological: Negative for dizziness, seizures and headaches.  Psychiatric/Behavioral: Negative for dysphoric mood. The patient is not nervous/anxious.   All other systems reviewed and are negative.    Physical Exam  BP 140/80 (BP Location: Left Arm, Cuff Size: Normal)   Pulse (!) 101   Ht _0  (1.727 m)   Wt 234 lb 9.6 oz (106.4 kg)   SpO2 96%   BMI 35.67 kg/m   Wt Readings from Last 5 Encounters:  05/30/18 234 lb 9.6 oz (106.4 kg)  03/20/18 234 lb (106.1 kg)  12/07/17 236 lb (107 kg)  11/02/17 233 lb 0.4 oz (105.7 kg)  10/25/17 233 lb (105.7 kg)     Physical Exam  Constitutional: He is oriented to person, place, and time and well-developed, well-nourished, and in no distress. No distress.  HENT:  Head: Normocephalic and atraumatic.  Right Ear: Hearing, tympanic membrane, external ear and ear canal normal.  Left Ear: Hearing, tympanic membrane,  external ear and ear canal normal.  Nose: Right sinus exhibits maxillary sinus tenderness. Right sinus exhibits no frontal sinus tenderness. Left sinus exhibits no maxillary sinus tenderness and no frontal sinus tenderness.  Mouth/Throat: Uvula is midline and oropharynx is clear and moist. No oropharyngeal exudate.  Maxillary tenderness R > L   Eyes: Pupils are equal, round, and reactive to light.  Neck: Normal range of motion. Neck supple. No JVD present.  Cardiovascular: Normal rate, regular rhythm and normal heart sounds.  Pulmonary/Chest: Effort normal.  No accessory muscle usage. No respiratory distress. He has no decreased breath sounds. He has wheezes (expiratory wheezes ). He has no rhonchi. He has rales (? minor bilaterally in bases ).  Abdominal: Soft. Bowel sounds are normal. There is no tenderness.  Musculoskeletal: Normal range of motion. He exhibits no edema.  Lymphadenopathy:    He has no cervical adenopathy.  Neurological: He is alert and oriented to person, place, and time. Gait normal.  Skin: Skin is warm and dry. He is not diaphoretic. No erythema.  Psychiatric: Mood, memory, affect and judgment normal.  Nursing note and vitals reviewed.     Lab Results:  CBC    Component Value Date/Time   WBC 11.2 (H) 12/07/2017 1041   RBC 3.81 (L) 12/07/2017 1041   HGB 13.4 12/07/2017 1041   HCT 38.4 (L) 12/07/2017 1041   PLT 263 12/07/2017 1041   MCV 100.8 (H) 12/07/2017 1041   MCH 35.2 (H) 12/07/2017 1041   MCHC 34.9 12/07/2017 1041   RDW 14.2 12/07/2017 1041   LYMPHSABS 2,195 12/07/2017 1041   EOSABS 246 12/07/2017 1041   BASOSABS 56 12/07/2017 1041    BMET    Component Value Date/Time   NA 139 03/20/2018 1054   K 4.3 03/20/2018 1054   CL 102 03/20/2018 1054   CO2 28 03/20/2018 1054   GLUCOSE 110 (H) 03/20/2018 1054   BUN 17 03/20/2018 1054   CREATININE 0.89 03/20/2018 1054   CALCIUM 8.9 03/20/2018 1054   GFRNONAA 84 03/20/2018 1054   GFRAA 97 03/20/2018 1054      BNP No results found for: BNP  ProBNP No results found for: PROBNP  Imaging: Dg Chest 2 View  Result Date: 05/30/2018 CLINICAL DATA:  Cough.  Shortness of breath. EXAM: CHEST - 2 VIEW COMPARISON:  Chest x-ray 02/18/2017, 07/31/2009. FINDINGS: Cardiomegaly. No pulmonary venous congestion.Stable calcified nodule left upper lung consistent granuloma. Stable diffuse bilateral from interstitial prominence consistent chronic interstitial lung disease. Stable left base pleural thickening consistent with scarring. No pleural effusion or pneumothorax. IMPRESSION: 1.  Stable cardiomegaly.  No pulmonary venous congestion. 2. Stable bilateral interstitial prominence suggesting chronic interstitial lung disease. Stable left base pleural thickening consistent scarring Electronically Signed   By: Marcello Moores  Register   On: 05/30/2018 14:56      Assessment & Plan:   Pleasant 77 year old patient seen today.  Patient with COPD exacerbation and probable sinusitis.  Will treat patient with doxycycline as well as prednisone taper.  Chest x-ray today.  We will have patient follow-up in 2 to 4 weeks.   If symptoms worsen or do not improve under this current regimen may need to consider repeat pulmonary function testing/echo/high-res CT.  To help with explanation of progressive worsening dyspnea.  OSA on CPAP We recommend that you continue using your CPAP daily >>>Keep up the hard work using your device >>> Goal should be wearing this for the entire night that you are sleeping, at least 4 to 6 hours  Remember:  . Do not drive or operate heavy machinery if tired or drowsy.  . Please notify the supply company and office if you are unable to use your device regularly due to missing supplies or machine being broken.  . Work on maintaining a healthy weight and following your recommended nutrition plan  . Maintain proper daily exercise and movement  . Maintaining proper use of your device can also help improve  management of other chronic illnesses such as: Blood pressure, blood sugars, and weight  management.   BiPAP/ CPAP Cleaning:  >>>Clean weekly, with Dawn soap, and bottle brush.  Set up to air dry.    COPD (chronic obstructive pulmonary disease) (HCC) Doxycycline >>> 1 100 mg tablet every 12 hours for 7 days >>>take with food  >>>wear sunscreen   Prednisone 23m tablet  >>>4 tabs for 2 days, then 3 tabs for 2 days, 2 tabs for 2 days, then 1 tab for 2 days, then stop >>>take with food  >>>take in the morning  >>> This replaces your daily 5 mg prednisone  Chest x-ray today  Breo Ellipta 100 >>> Take 1 puff daily in the morning right when you wake up >>>Rinse your mouth out after use >>>This is a daily maintenance inhaler, NOT a rescue inhaler >>>Contact our office if you are having difficulties affording or obtaining this medication >>>It is important for you to be able to take this daily and not miss any doses   Spiriva Respimat 2.5 >>> 2 puffs daily >>> Do this every day >>>This is not a rescue inhaler  We will hold off on flu vaccine at this time  Follow-up with our office in 2 to 4 weeks, let [place] sooner if your symptoms are not improving   Acute sinusitis Doxycycline >>> 1 100 mg tablet every 12 hours for 7 days >>>take with food  >>>wear sunscreen   Prednisone 165mtablet  >>>4 tabs for 2 days, then 3 tabs for 2 days, 2 tabs for 2 days, then 1 tab for 2 days, then stop >>>take with food  >>>take in the morning  >>> This replaces your daily 5 mg prednisone  Chest x-ray today       BrLauraine RinneNP 05/30/2018

## 2018-05-31 ENCOUNTER — Other Ambulatory Visit: Payer: Self-pay | Admitting: Pulmonary Disease

## 2018-05-31 NOTE — Progress Notes (Signed)
Chest x-ray results of come back.  I think if your symptoms do not improve with the current treatment plan we have for you we can proceed forward with a high-res CT as well as formal pulmonary function testing.  This will be repeated for 2015 study you did [place].  Continue with plan of care at this time.  If symptoms are not improving contact our office and we can order these test to get started.  Keep scheduled follow-up with our office  [name] FNP

## 2018-05-31 NOTE — Progress Notes (Signed)
LMTCB

## 2018-06-12 NOTE — Progress Notes (Signed)
_0  ID: [name], male    DOB: 1941/09/01, 77 y.o.   MRN: [id number]  Chief Complaint  Patient presents with  . Follow-up    COPD, OSA    Referring provider: Javier Glazier, MD  HPI:  77 year old male former smoker referred to our office in 2018.  Managed in our office for COPD and obstructive sleep apnea (managed on CPAP).  PMH: Rheumatoid arthritis Smoker/ Smoking History: Former Smoker, Quit 2012, 81 pack years Maintenance:  Breo Ellipta 100, Spiriva 2.5 Pt of: Dr. [place] Bells  Recent [place] [facility] Encounters:   05/22/2018-telephone encounter-Augmentin twice daily for 7 days via telephone encounter  05/30/2018  - Visit - BM  77 year old patient presenting today with increased shortness of breath for the past 2 to 3 weeks.  Patient reports she is been having dark yellow productive mucus.  Cough is occasionally productive, typically dry. Patient adherent to Breo Ellipta 100 and Spiriva 2.5.  Patient is requesting a refill of the [facility].  This is almost out of this.  Patient reports that pharmacy had recently sent this information to Dr. Anastasia Pall office. Plan: doxy, pred taper, cont breo/ spriva, cxry, follow up in 2 weeks   06/13/2018  - Visit   77 year old patient presenting today for follow-up visit.  Patient was recently treated with antibiotics as well as prednisone taper.  Patient reports that since being last seen he has improved with fatigue.  As well as with shortness of breath but is still present.  Patient is also being worked up by Dr. [name] for potential fusion of T9-L5.   Pt received flu vaccine yesterday. Our records are updated.    Tests:  Pulmonary function testing: >>>October 2015 from [facility] ratio 58% FEV1 1.54 L 57% predicted, FVC 2.65 L 70% predicted, total lung capacity 157% predicted residual volume 263% predicted DLCO 61% predicted   Chest imaging: March 2018 CT chest lung cancer screening: Rads 2, images independently  reviewed showing nonspecific scarring more in the right lung base than the left, not consistent with an interstitial lung disease 12/19/2017-CT chest lung cancer screening-lung RADS 2, follow-up in 12 months 05/30/2018-chest x-ray-stable bilateral interstitial prominence suggesting chronic interstitial lung disease  Polysomnogram: >>>N. PSG on 04/25/2000 gave an index of 78/hr.  Ever since then he has  been using CPAP at [facility]  Chart Review:     Specialty Problems      Pulmonary Problems   GOLD COPD II B    Pulmonary function testing: October 2015 from [facility] ratio 58% FEV1 1.54 L 57% predicted, FVC 2.65 L 70% predicted, total lung capacity 157% predicted residual volume 263% predicted DLCO 61% predicted  06/13/18 >>> PFT >>>       OSA on CPAP    >>>N. PSG on 04/25/2000 gave an index of 78/hr.  Ever since then he has  been using CPAP at [facility]       ALLERGIC RHINITIS    Qualifier: Diagnosis of  By: Annamaria Boots MD, Clinton D       Hypoxia   Acute sinusitis   Cough due to ACE inhibitor    06/13/18 - stopped lisinopril  06/13/18 - started losartan       Dyspnea      Allergies  Allergen Reactions  . Sulfa Antibiotics Shortness Of Breath  . Sulfasalazine Shortness Of Breath  . Lisinopril Cough  . Varenicline Other (See Comments)    Strange thoughts    Immunization History  Administered Date(s)  Administered  . 19-influenza Whole 07/18/2012, 06/14/2014, 08/12/2015, 06/03/2016  . HiB (PRP-T) 08/23/2017  . Influenza, High Dose Seasonal PF 06/12/2018  . Influenza,inj,Quad PF,6+ Mos 06/28/2017  . Influenza-Unspecified 07/18/2012, 06/14/2014, 08/12/2015, 06/03/2016, 06/28/2017  . Meningococcal B, OMV 08/23/2017, 09/21/2017  . Meningococcal Conjugate 08/23/2017  . Meningococcal Mcv4o 08/23/2017, 10/18/2017  . Pneumococcal Conjugate-13 11/28/2013, 11/28/2013  . Pneumococcal Polysaccharide-23 12/26/2005, 09/13/2012  . Pneumococcal-Unspecified 12/26/2005, 09/13/2012  . Tdap  11/28/2013, 11/28/2013  . Zoster 06/06/2012, 06/06/2012    Past Medical History:  Diagnosis Date  . Chronic lower back pain   . Complication of anesthesia 01/2017   was in ICU due to breathing complications-  . COPD (chronic obstructive pulmonary disease) ([place])   . Depression   . Dyspnea    on exertion  . GERD (gastroesophageal reflux disease)   . H/O seasonal allergies   . High cholesterol   . 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 History of kidney stones   . Hypertension   . 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 OSA on CPAP   . Pneumonia    "I've had it several times; last time was end of Jan 2018" (05/02/2017)  . Prostate cancer ([place])   . Rheumatoid arteritis    "all over" (05/02/2017)  . Thrush 12/07/2017    Tobacco History: Social History   Tobacco Use  Smoking Status Former Smoker  . Packs/day: 1.50  . Years: 54.00  . Pack years: 81.00  . Types: Cigarettes  . Last attempt to quit: 09/27/2014  . Years since quitting: 3.7  Smokeless Tobacco Never Used  Tobacco Comment   05/02/2017 "quit vaping 09/2016"   Counseling given: Yes Comment: 05/02/2017 "quit vaping 09/2016"  Continue not smoking  Outpatient Encounter Medications as of 06/13/2018  Medication Sig  . albuterol (PROVENTIL HFA;VENTOLIN HFA) 108 (90 Base) MCG/ACT inhaler Inhale 2 puffs into the lungs every 6 (six) hours as needed for wheezing or shortness of breath.  Marland Kitchen BREO ELLIPTA 100-25 MCG/INH AEPB INHALE 1 PUFF INTO THE LUNGS ONCE DAILY  . ciprofloxacin (CIPRO) 750 MG tablet TAKE ONE TABLET BY MOUTH TWICE DAILY  . DULoxetine (CYMBALTA) 60 MG capsule Take 60 mg by mouth every evening.   . finasteride (PROSCAR) 5 MG tablet Take 5 mg by mouth at bedtime.   . fluconazole (DIFLUCAN) 100 MG tablet Take 1 tablet (100 mg total) by mouth daily.  . fluticasone (FLONASE) 50 MCG/ACT nasal spray Place 2 sprays into both nostrils daily as needed for allergies.     . folic acid (FOLVITE) 1 MG tablet Take 1 mg by mouth daily.  Marland Kitchen gabapentin (NEURONTIN) 300 MG capsule Take 600 mg by mouth 3 (three) times daily.  . methotrexate 2.5 MG tablet Take 25 mg by mouth every Saturday. Saturday  . metoprolol succinate (TOPROL-XL) 25 MG 24 hr tablet Take 25 mg by mouth at bedtime.  . predniSONE (DELTASONE) 5 MG tablet Take 5 mg by mouth daily with breakfast.  . sennosides-docusate sodium (SENOKOT-S) 8.6-50 MG tablet Take 2 tablets by mouth at bedtime as needed (for constipation.).   Marland Kitchen simvastatin (ZOCOR) 40 MG tablet Take 40 mg by mouth at bedtime.   . sodium chloride (OCEAN) 0.65 % SOLN nasal spray Place 1 spray into both nostrils as needed for congestion.  . tamsulosin (FLOMAX) 0.4 MG CAPS capsule Take 0.4 mg by mouth at bedtime.   . Tiotropium Bromide Monohydrate (SPIRIVA RESPIMAT) 2.5 MCG/ACT AERS Inhale 2  puffs into the lungs daily.  . traZODone (DESYREL) 50 MG tablet Take 50 mg by mouth at bedtime.  . [DISCONTINUED] lisinopril (PRINIVIL,ZESTRIL) 20 MG tablet Take 20 mg by mouth at bedtime.   . famotidine (PEPCID) 40 MG tablet Take 1 tablet (40 mg total) by mouth daily. (Patient not taking: Reported on 06/13/2018)  . fluticasone furoate-vilanterol (BREO ELLIPTA) 100-25 MCG/INH AEPB Inhale 1 puff into the lungs daily.  Marland Kitchen losartan (COZAAR) 25 MG tablet Take 1 tablet (25 mg total) by mouth daily.  . Tiotropium Bromide Monohydrate (SPIRIVA RESPIMAT) 2.5 MCG/ACT AERS Inhale 2 puffs into the lungs daily.  . [DISCONTINUED] amoxicillin-clavulanate (AUGMENTIN) 875-125 MG tablet Take 1 tablet by mouth 2 (two) times daily. (Patient not taking: Reported on 06/13/2018)  . [DISCONTINUED] doxycycline (VIBRA-TABS) 100 MG tablet Take 1 tablet (100 mg total) by mouth 2 (two) times daily. (Patient not taking: Reported on 06/13/2018)  . [DISCONTINUED] fluticasone furoate-vilanterol (BREO ELLIPTA) 100-25 MCG/INH AEPB Inhale 1 puff into the lungs daily.  . [DISCONTINUED] predniSONE  (DELTASONE) 10 MG tablet 4 tabs for 2 days, then 3 tabs for 2 days, 2 tabs for 2 days, then 1 tab for 2 days, then stop (Patient not taking: Reported on 06/13/2018)  . [DISCONTINUED] Tiotropium Bromide Monohydrate (SPIRIVA RESPIMAT) 2.5 MCG/ACT AERS Inhale 1 puff into the lungs daily. (Patient not taking: Reported on 06/13/2018)   No facility-administered encounter medications on file as of 06/13/2018.      Review of Systems  Review of Systems  Constitutional: Positive for fatigue. Negative for activity change, chills, fever and unexpected weight change.  HENT: Negative for congestion and postnasal drip.   Eyes: Negative.   Respiratory: Positive for cough (dry cough ) and shortness of breath (improved, able to get deeper breaths today vs last visit ). Negative for wheezing.   Cardiovascular: Positive for leg swelling (chronic ). Negative for chest pain and palpitations.  Gastrointestinal: Negative for constipation, diarrhea, nausea and vomiting.  Endocrine: Negative.   Musculoskeletal: Positive for gait problem (walking with cane ).  Skin: Negative.   Neurological: Negative for dizziness and headaches.  Psychiatric/Behavioral: Negative.  Negative for dysphoric mood. The patient is not nervous/anxious.   All other systems reviewed and are negative.    Physical Exam  BP 138/78 (BP Location: Left Arm, Cuff Size: Normal)   Pulse 95   Ht _0  (1.727 m)   Wt 229 lb 9.6 oz (104.1 kg)   SpO2 93%   BMI 34.91 kg/m   Wt Readings from Last 5 Encounters:  06/13/18 229 lb 9.6 oz (104.1 kg)  05/30/18 234 lb 9.6 oz (106.4 kg)  03/20/18 234 lb (106.1 kg)  12/07/17 236 lb (107 kg)  11/02/17 233 lb 0.4 oz (105.7 kg)    Physical Exam  Constitutional: He is oriented to person, place, and time and well-developed, well-nourished, and in no distress. Vital signs are normal. No distress.  HENT:  Head: Normocephalic and atraumatic.  Right Ear: Hearing, tympanic membrane, external ear and ear canal  normal.  Left Ear: Hearing, tympanic membrane, external ear and ear canal normal.  Nose: Nose normal.  Mouth/Throat: Uvula is midline and oropharynx is clear and moist. No oropharyngeal exudate.  Eyes: Pupils are equal, round, and reactive to light.  Neck: Normal range of motion. Neck supple. No JVD present.  Cardiovascular: Normal rate, regular rhythm and normal heart sounds.  Pulmonary/Chest: Effort normal. No accessory muscle usage. No respiratory distress. He has no decreased breath sounds. He has no  wheezes. He has no rhonchi. He has rales (in bases, R> L ) in the right lower field and the left lower field.  Abdominal: Soft. Bowel sounds are normal. There is no tenderness.  Musculoskeletal: Normal range of motion. He exhibits no edema.  Lymphadenopathy:    He has no cervical adenopathy.  Neurological: He is alert and oriented to person, place, and time. Gait normal.  Skin: Skin is warm and dry. He is not diaphoretic. No erythema.  Psychiatric: Mood, memory, affect and judgment normal.  Nursing note and vitals reviewed.    Lab Results:  CBC    Component Value Date/Time   WBC 11.2 (H) 12/07/2017 1041   RBC 3.81 (L) 12/07/2017 1041   HGB 13.4 12/07/2017 1041   HCT 38.4 (L) 12/07/2017 1041   PLT 263 12/07/2017 1041   MCV 100.8 (H) 12/07/2017 1041   MCH 35.2 (H) 12/07/2017 1041   MCHC 34.9 12/07/2017 1041   RDW 14.2 12/07/2017 1041   LYMPHSABS 2,195 12/07/2017 1041   EOSABS 246 12/07/2017 1041   BASOSABS 56 12/07/2017 1041    BMET    Component Value Date/Time   NA 139 03/20/2018 1054   K 4.3 03/20/2018 1054   CL 102 03/20/2018 1054   CO2 28 03/20/2018 1054   GLUCOSE 110 (H) 03/20/2018 1054   BUN 17 03/20/2018 1054   CREATININE 0.89 03/20/2018 1054   CALCIUM 8.9 03/20/2018 1054   GFRNONAA 84 03/20/2018 1054   GFRAA 97 03/20/2018 1054    BNP No results found for: BNP  ProBNP No results found for: PROBNP  Imaging: Dg Chest 2 View  Result Date:  05/30/2018 CLINICAL DATA:  Cough.  Shortness of breath. EXAM: CHEST - 2 VIEW COMPARISON:  Chest x-ray 02/18/2017, 07/31/2009. FINDINGS: Cardiomegaly. No pulmonary venous congestion.Stable calcified nodule left upper lung consistent granuloma. Stable diffuse bilateral from interstitial prominence consistent chronic interstitial lung disease. Stable left base pleural thickening consistent with scarring. No pleural effusion or pneumothorax. IMPRESSION: 1.  Stable cardiomegaly.  No pulmonary venous congestion. 2. Stable bilateral interstitial prominence suggesting chronic interstitial lung disease. Stable left base pleural thickening consistent scarring Electronically Signed   By: Marcello Moores  Register   On: 05/30/2018 14:56      Assessment & Plan:   Pleasant 77 year old patient seen today for follow-up visit.  Will stop lisinopril today and start losartan 25 mg daily instead.  We will also order pulmonary function testing as well as CT high-res due to Rales in lung bases.  Continue CPAP.  We will request download.  Continue Brio Ellipta 100, continue Spiriva 2.5, samples provided today.  Patient to follow-up with our office in 2 to 4 weeks after completing CT as well as PFT.  OSA on CPAP We recommend that you continue using your CPAP daily >>>Keep up the hard work using your device >>> Goal should be wearing this for the entire night that you are sleeping, at least 4 to 6 hours  Remember:  . Do not drive or operate heavy machinery if tired or drowsy.  . Please notify the supply company and office if you are unable to use your device regularly due to missing supplies or machine being broken.  . Work on maintaining a healthy weight and following your recommended nutrition plan  . Maintain proper daily exercise and movement  . Maintaining proper use of your device can also help improve management of other chronic illnesses such as: Blood pressure, blood sugars, and weight management.   BiPAP/ CPAP  Cleaning:   >>>Clean weekly, with Dawn soap, and bottle brush.  Set up to air dry.   GOLD COPD II B Pulmonary function test to be completed over the next 2 to 4 weeks  CT high-res to be completed over the next 2 weeks  Breo Ellipta 100 >>> Take 1 puff daily in the morning right when you wake up >>>Rinse your mouth out after use >>>This is a daily maintenance inhaler, NOT a rescue inhaler >>>Contact our office if you are having difficulties affording or obtaining this medication >>>It is important for you to be able to take this daily and not miss any doses  Spiriva Respimat 2.5 >>> 2 puffs daily >>> Do this every day >>>This is not a rescue inhaler  Good job on already getting your flu shot!  Follow up in 2-4 weeks after pft and high res ct    Cough due to ACE inhibitor Pulmonary function test to be completed over the next 2 to 4 weeks  CT high-res to be completed over the next 2 weeks  Stop lisinopril today >>>Go home and throughout these medications  Start losartan 25 mg daily >>>Check your blood pressure routinely let us know if your blood pressure results are above 140/90  Good job on already getting your flu shot!  Follow up in 2-4 weeks after pft and high res ct    Former tobacco use Pulmonary function test to be completed over the next 2 to 4 weeks  CT high-res to be completed over the next 2 weeks  Continue lung cancer screening program   Good job on already getting your flu shot!  Follow up in 2-4 weeks after pft and high res ct    Rheumatoid arthritis ([place]) CT high res ordered today       Lauraine Rinne, NP 06/13/2018

## 2018-06-13 ENCOUNTER — Ambulatory Visit: Payer: Medicare Other | Admitting: Pulmonary Disease

## 2018-06-13 ENCOUNTER — Encounter: Payer: Self-pay | Admitting: Pulmonary Disease

## 2018-06-13 VITALS — BP 138/78 | HR 95 | Ht 68.0 in | Wt 229.6 lb

## 2018-06-13 DIAGNOSIS — M05741 Rheumatoid arthritis with rheumatoid factor of right hand without organ or systems involvement: Secondary | ICD-10-CM

## 2018-06-13 DIAGNOSIS — J42 Unspecified chronic bronchitis: Secondary | ICD-10-CM | POA: Diagnosis not present

## 2018-06-13 DIAGNOSIS — R0609 Other forms of dyspnea: Secondary | ICD-10-CM | POA: Diagnosis not present

## 2018-06-13 DIAGNOSIS — R05 Cough: Secondary | ICD-10-CM | POA: Insufficient documentation

## 2018-06-13 DIAGNOSIS — M05742 Rheumatoid arthritis with rheumatoid factor of left hand without organ or systems involvement: Secondary | ICD-10-CM

## 2018-06-13 DIAGNOSIS — T464X5A Adverse effect of angiotensin-converting-enzyme inhibitors, initial encounter: Secondary | ICD-10-CM

## 2018-06-13 DIAGNOSIS — Z9989 Dependence on other enabling machines and devices: Secondary | ICD-10-CM

## 2018-06-13 DIAGNOSIS — R0600 Dyspnea, unspecified: Secondary | ICD-10-CM | POA: Insufficient documentation

## 2018-06-13 DIAGNOSIS — G4733 Obstructive sleep apnea (adult) (pediatric): Secondary | ICD-10-CM | POA: Diagnosis not present

## 2018-06-13 DIAGNOSIS — Z87891 Personal history of nicotine dependence: Secondary | ICD-10-CM

## 2018-06-13 DIAGNOSIS — R058 Other specified cough: Secondary | ICD-10-CM | POA: Insufficient documentation

## 2018-06-13 MED ORDER — LOSARTAN POTASSIUM 25 MG PO TABS: 25.0000 mg | ORAL_TABLET | Freq: Every day | ORAL | 5 refills | Status: DC

## 2018-06-13 MED ORDER — TIOTROPIUM BROMIDE MONOHYDRATE 2.5 MCG/ACT IN AERS: 2.0000 | INHALATION_SPRAY | Freq: Every day | RESPIRATORY_TRACT | 0 refills | Status: DC

## 2018-06-13 MED ORDER — FLUTICASONE FUROATE-VILANTEROL 100-25 MCG/INH IN AEPB: 1.0000 | INHALATION_SPRAY | Freq: Every day | RESPIRATORY_TRACT | 0 refills | Status: DC

## 2018-06-13 NOTE — Assessment & Plan Note (Signed)
CT high res ordered today

## 2018-06-13 NOTE — Assessment & Plan Note (Signed)

## 2018-06-13 NOTE — Patient Instructions (Addendum)
Pulmonary function test to be completed over the next 2 to 4 weeks  CT high-res to be completed over the next 2 weeks  Stop lisinopril today >>>Go home and throughout these medications  Start losartan 25 mg daily >>>Check your blood pressure routinely let us know if your blood pressure results are above 140/90  Breo Ellipta 100 >>> Take 1 puff daily in the morning right when you wake up >>>Rinse your mouth out after use >>>This is a daily maintenance inhaler, NOT a rescue inhaler >>>Contact our office if you are having difficulties affording or obtaining this medication >>>It is important for you to be able to take this daily and not miss any doses  Spiriva Respimat 2.5 >>> 2 puffs daily >>> Do this every day >>>This is not a rescue inhaler  We recommend that you continue using your CPAP daily >>>Keep up the hard work using your device >>> Goal should be wearing this for the entire night that you are sleeping, at least 4 to 6 hours  Remember:  . Do not drive or operate heavy machinery if tired or drowsy.  . Please notify the supply company and office if you are unable to use your device regularly due to missing supplies or machine being broken.  . Work on maintaining a healthy weight and following your recommended nutrition plan  . Maintain proper daily exercise and movement  . Maintaining proper use of your device can also help improve management of other chronic illnesses such as: Blood pressure, blood sugars, and weight management.   BiPAP/ CPAP Cleaning:  >>>Clean weekly, with Dawn soap, and bottle brush.  Set up to air dry.  Good job on already getting your flu shot!  Follow up in 2-4 weeks after pft and high res ct    It is flu season:   >>>Remember to be washing your hands regularly, using hand sanitizer, be careful to use around herself with has contact with people who are sick will increase her chances of getting sick yourself. >>> Best ways to protect herself from  the flu: Receive the yearly flu vaccine, practice good hand hygiene washing with soap and also using hand sanitizer when available, eat a nutritious meals, get adequate rest, hydrate appropriately   Please contact the office if your symptoms worsen or you have concerns that you are not improving.   Thank you for choosing [facility] for your healthcare, and for allowing [place] to partner with you on your healthcare journey. I am thankful to be able to provide care to you today.   [name] FNP-C

## 2018-06-13 NOTE — Assessment & Plan Note (Signed)
Pulmonary function test to be completed over the next 2 to 4 weeks  CT high-res to be completed over the next 2 weeks  Continue lung cancer screening program   Good job on already getting your flu shot!  Follow up in 2-4 weeks after pft and high res ct

## 2018-06-13 NOTE — Assessment & Plan Note (Signed)
Pulmonary function test to be completed over the next 2 to 4 weeks  CT high-res to be completed over the next 2 weeks  Stop lisinopril today >>>Go home and throughout these medications  Start losartan 25 mg daily >>>Check your blood pressure routinely let us know if your blood pressure results are above 140/90  Good job on already getting your flu shot!  Follow up in 2-4 weeks after pft and high res ct

## 2018-06-13 NOTE — Assessment & Plan Note (Signed)
Pulmonary function test to be completed over the next 2 to 4 weeks  CT high-res to be completed over the next 2 weeks  Breo Ellipta 100 >>> Take 1 puff daily in the morning right when you wake up >>>Rinse your mouth out after use >>>This is a daily maintenance inhaler, NOT a rescue inhaler >>>Contact our office if you are having difficulties affording or obtaining this medication >>>It is important for you to be able to take this daily and not miss any doses  Spiriva Respimat 2.5 >>> 2 puffs daily >>> Do this every day >>>This is not a rescue inhaler  Good job on already getting your flu shot!  Follow up in 2-4 weeks after pft and high res ct

## 2018-06-14 NOTE — Progress Notes (Signed)
Discussed results with patient in office.  Proceed forward with high res ct and pft. Keep follow up with our office.   [name] FNP

## 2018-06-14 NOTE — Progress Notes (Signed)
Reviewed, agree 

## 2018-06-15 ENCOUNTER — Telehealth: Payer: Self-pay | Admitting: Pulmonary Disease

## 2018-06-15 NOTE — Telephone Encounter (Signed)
Spoke with pt, he wants to know why he must have a CT of his chest again when he just had this done (low dose screening) in March. He would like to know what the indication is. Please advise [name]

## 2018-06-15 NOTE — Telephone Encounter (Signed)
ATC pt, no answer. Left message for pt to call back.  

## 2018-06-16 NOTE — Telephone Encounter (Signed)
Left message informing patient NP Warner Mccreedy would be back in the office on Monday and we would follow up with him then.

## 2018-06-19 ENCOUNTER — Other Ambulatory Visit: Payer: Self-pay | Admitting: Neurosurgery

## 2018-06-19 NOTE — Telephone Encounter (Signed)
Spoke with patient. He stated that he is ok with proceeding with both tests. He is also in the middle of scheduling back surgery and wants to get this taken care of. Patient has been scheduled for a HRCT for 10/3 at 1030. He has an OV on 10/8 at 2pm with a PFT scheduled at 1pm on the same day.   Nothing further needed at time of call.

## 2018-06-19 NOTE — Telephone Encounter (Signed)
[name], patient is requesting additional information on the need for her HRCT Please advise, thank you

## 2018-06-19 NOTE — Telephone Encounter (Signed)
The patient brings up a good question.    We discussed this in our office visit.  Patient does have stable scarring seen on previous CTs in bases.  But as discussed in our office visit in order for [place] to evaluate for potential interstitial lung disease a high-res CT would provide the best imaging.  Recently patient has just received low-dose CT screenings.  His most recent CT that brings into question is a low-dose CT in March/2019.  If I am remembering correctly from our last office visit I gave the patient the option to completing pulmonary function testing first.  He wanted to go ahead and proceed both tests.  If the patient would like to hold off on a high-res CT at this time until he completes the pulmonary function testing and we review the results, that is okay.    If DLCO on the new PFT is dropping from his last PFT in 2015 that I would recommend that we get a high-res CT.  [name] FNP

## 2018-06-20 NOTE — Telephone Encounter (Signed)
[name] please advise on patients e-mail below. Thank you.

## 2018-06-28 ENCOUNTER — Other Ambulatory Visit: Payer: Self-pay | Admitting: Neurosurgery

## 2018-06-29 ENCOUNTER — Ambulatory Visit (HOSPITAL_BASED_OUTPATIENT_CLINIC_OR_DEPARTMENT_OTHER)
Admission: RE | Admit: 2018-06-29 | Discharge: 2018-06-29 | Disposition: A | Discharge status: Home / Self Care | Payer: Medicare Other | Source: Ambulatory Visit | Attending: Pulmonary Disease | Admitting: Pulmonary Disease

## 2018-06-29 ENCOUNTER — Telehealth: Payer: Self-pay | Admitting: Pulmonary Disease

## 2018-06-29 DIAGNOSIS — I2510 Atherosclerotic heart disease of native coronary artery without angina pectoris: Secondary | ICD-10-CM | POA: Insufficient documentation

## 2018-06-29 DIAGNOSIS — J438 Other emphysema: Secondary | ICD-10-CM | POA: Diagnosis not present

## 2018-06-29 DIAGNOSIS — I281 Aneurysm of pulmonary artery: Secondary | ICD-10-CM | POA: Insufficient documentation

## 2018-06-29 DIAGNOSIS — R918 Other nonspecific abnormal finding of lung field: Secondary | ICD-10-CM | POA: Insufficient documentation

## 2018-06-29 DIAGNOSIS — R911 Solitary pulmonary nodule: Secondary | ICD-10-CM | POA: Insufficient documentation

## 2018-06-29 DIAGNOSIS — J432 Centrilobular emphysema: Secondary | ICD-10-CM | POA: Diagnosis not present

## 2018-06-29 DIAGNOSIS — I700 Atherosclerosis of aorta: Secondary | ICD-10-CM | POA: Diagnosis not present

## 2018-06-29 DIAGNOSIS — R0609 Other forms of dyspnea: Secondary | ICD-10-CM | POA: Insufficient documentation

## 2018-06-29 MED ORDER — LOSARTAN POTASSIUM 50 MG PO TABS: 50.0000 mg | ORAL_TABLET | Freq: Every day | ORAL | 5 refills | Status: DC

## 2018-06-29 NOTE — Telephone Encounter (Signed)
Called and spoke with patient. Patient experiencing higher blood pressures (135-145/76-90); denies headache, blurred vision, dizziness, lightheadedness, chest pain, arm pain, or jaw pain.   Patient wants to know if he can take 91m of Losartan instead of his 251mcurrently.   Last seen in Office 06/13/2018 with B. MaWarner Mccreedy BrAaron Edelmanlease advise

## 2018-06-29 NOTE — Telephone Encounter (Signed)
Pt is calling back [phone]

## 2018-06-29 NOTE — Telephone Encounter (Signed)
Called and spoke with pt letting him know that [name], NP has said it is okay to send a script of losartan 65m to pharmacy for pt.  Stated to pt until he receives the new Rx to take 2 tabs of the 262m  Pt expressed understanding. While speaking to pt, he stated to me to make sure I put a note for the pharmacy to deliver the med [facility] Nothing further needed.

## 2018-06-29 NOTE — Telephone Encounter (Signed)
Yes okay to increase. Thank you for calling.   Please place order for Losartan 2m daily (1 tablet) for management of hypertension.      Pt can take two 218mtablets of Losartan in the mean time.  Please continue to monitor your blood pressure daily and notify our office if blood pressure remains elevated.   BrWyn QuakerNP

## 2018-07-03 ENCOUNTER — Encounter (HOSPITAL_COMMUNITY): Payer: Self-pay

## 2018-07-03 ENCOUNTER — Other Ambulatory Visit: Payer: Self-pay

## 2018-07-03 ENCOUNTER — Encounter (HOSPITAL_COMMUNITY)
Admission: RE | Admit: 2018-07-03 | Discharge: 2018-07-03 | Disposition: A | Discharge status: Home / Self Care | Payer: Medicare Other | Source: Ambulatory Visit | Attending: Neurosurgery | Admitting: Neurosurgery

## 2018-07-03 DIAGNOSIS — R9431 Abnormal electrocardiogram [ECG] [EKG]: Secondary | ICD-10-CM | POA: Diagnosis not present

## 2018-07-03 DIAGNOSIS — Z01812 Encounter for preprocedural laboratory examination: Secondary | ICD-10-CM | POA: Insufficient documentation

## 2018-07-03 DIAGNOSIS — Z01810 Encounter for preprocedural cardiovascular examination: Secondary | ICD-10-CM | POA: Insufficient documentation

## 2018-07-03 HISTORY — DX: Other secondary scoliosis, site unspecified: M41.50

## 2018-07-03 HISTORY — DX: Prediabetes: R73.03

## 2018-07-03 HISTORY — DX: Presence of spectacles and contact lenses: Z97.3

## 2018-07-03 HISTORY — DX: Other forms of scoliosis, site unspecified: M41.80

## 2018-07-03 LAB — COMPREHENSIVE METABOLIC PANEL
ALK PHOS: 46 U/L (ref 38–126)
ALT: 21 U/L (ref 0–44)
AST: 26 U/L (ref 15–41)
Albumin: 3.6 g/dL (ref 3.5–5.0)
Anion gap: 7 (ref 5–15)
BILIRUBIN TOTAL: 0.8 mg/dL (ref 0.3–1.2)
BUN: 19 mg/dL (ref 8–23)
CHLORIDE: 103 mmol/L (ref 98–111)
CO2: 25 mmol/L (ref 22–32)
CREATININE: 0.94 mg/dL (ref 0.61–1.24)
Calcium: 8.7 mg/dL — ABNORMAL LOW (ref 8.9–10.3)
GFR calc non Af Amer: >60 mL/min (ref >60)
Glucose, Bld: 100 mg/dL — ABNORMAL HIGH (ref 70–99)
Potassium: 4 mmol/L (ref 3.5–5.1)
Sodium: 135 mmol/L (ref 135–145)
TOTAL PROTEIN: 6.7 g/dL (ref 6.5–8.1)

## 2018-07-03 LAB — SURGICAL PCR SCREEN
MRSA, PCR: NEGATIVE
Staphylococcus aureus: NEGATIVE

## 2018-07-03 LAB — TYPE AND SCREEN
ABO/RH(D): A POS
Antibody Screen: NEGATIVE

## 2018-07-03 LAB — CBC
HCT: 41.9 % (ref 39.0–52.0)
Hemoglobin: 13.6 g/dL (ref 13.0–17.0)
MCH: 35.5 pg — ABNORMAL HIGH (ref 26.0–34.0)
MCHC: 32.5 g/dL (ref 30.0–36.0)
MCV: 109.4 fL — ABNORMAL HIGH (ref 78.0–100.0)
PLATELETS: 271 10*3/uL (ref 150–400)
RBC: 3.83 MIL/uL — AB (ref 4.22–5.81)
RDW: 15.2 % (ref 11.5–15.5)
WBC: 8.2 10*3/uL (ref 4.0–10.5)

## 2018-07-03 LAB — GLUCOSE, CAPILLARY: GLUCOSE-CAPILLARY: 115 mg/dL — AB (ref 70–99)

## 2018-07-03 LAB — HEMOGLOBIN A1C
Hgb A1c MFr Bld: 5.9 % — ABNORMAL HIGH (ref 4.8–5.6)
Mean Plasma Glucose: 122.63 mg/dL

## 2018-07-03 NOTE — Progress Notes (Signed)
Pt denies any acute cardiopulmonary issues. Pt denies chest pain and being under the care of a cardiologist. Pt denies having an echo and cardiac cath but stated that a stress test was performed at [facility]; records requested (stress test, LOV note and any cardiac studies). Pt stated that he was instructed by surgeon to stop taking Aspirin 1 week prior to surgery. Pt denies having an EKG within the ;last year. Pt denies recent labs. Pt verbalized understanding of all pre-op instructions. Pt chart forwarded to anesthesia for review of pulmonary, cardiac history and anesthesia complication.

## 2018-07-03 NOTE — Pre-Procedure Instructions (Signed)
   [name]  07/03/2018      [facility] - [place], [place] - [address] [address] Phone: [phone] Fax: [place], [place] - [address] [place] [address] Phone: [phone] Fax: [phone]  [place], [place] - [address] [facility]. [address] [place] [address] Phone: [phone] Fax: [phone]  [id number] [place], [place] [id number]: [phone]: [phone]   Your procedure is scheduled on Monday, July 10, 2018  [facility]  Call this number if you have problems the morning of surgery:  [phone]   Remember:  Do not eat or drink after midnight Sunday, July 09, 2018   Take these medicines the morning of surgery with A SIP OF WATER : gabapentin (NEURONTIN), predniSONE (DELTASONE), Tiotropium Bromide Monohydrate (SPIRIVA RESPIMAT), BREO ELLIPTA  If needed:fluticasone (FLONASE)  nasal spray for allergies,  sodium chloride (OCEAN) nasal spray  for congestion, albuterol (PROVENTIL HFA;VENTOLIN HFA)  Inhaler for wheezing or shortness of breath ( Bring inhaler in with you on day of surgery).  Stop taking Aspirin (unless advised otherwise by surgeon), vitamins, fish oil and herbal medications. Do not take any NSAIDs ie: Ibuprofen, Advil, Naproxen (Aleve), Motrin, BC and Goody Powder; stop now.  Do not wear jewelry, make-up or nail polish.  Do not wear lotions, powders, or perfumes, or deodorant.  Do not shave 48 hours prior to surgery.  Men may shave face and neck.  Do not bring valuables to the hospital.  [facility] or [facility].  Contacts, dentures or bridgework may not be worn into surgery.  Leave your suitcase in the car.  After  surgery it may be brought to your room Patients discharged the day of surgery will not be allowed to drive home.  Special instructions: See " [place]-Preparing For Surgery"  sheet. Please read over the following fact sheets that you were given. Pain Booklet, Coughing and Deep Breathing, MRSA Information and Surgical Site Infection Prevention

## 2018-07-04 ENCOUNTER — Encounter: Payer: Self-pay | Admitting: Pulmonary Disease

## 2018-07-04 ENCOUNTER — Ambulatory Visit: Payer: Medicare Other | Admitting: Pulmonary Disease

## 2018-07-04 ENCOUNTER — Ambulatory Visit (INDEPENDENT_AMBULATORY_CARE_PROVIDER_SITE_OTHER): Payer: Medicare Other | Admitting: Pulmonary Disease

## 2018-07-04 DIAGNOSIS — Z9989 Dependence on other enabling machines and devices: Secondary | ICD-10-CM

## 2018-07-04 DIAGNOSIS — G4733 Obstructive sleep apnea (adult) (pediatric): Secondary | ICD-10-CM | POA: Diagnosis not present

## 2018-07-04 DIAGNOSIS — M05741 Rheumatoid arthritis with rheumatoid factor of right hand without organ or systems involvement: Secondary | ICD-10-CM | POA: Diagnosis not present

## 2018-07-04 DIAGNOSIS — R0609 Other forms of dyspnea: Secondary | ICD-10-CM

## 2018-07-04 DIAGNOSIS — M05742 Rheumatoid arthritis with rheumatoid factor of left hand without organ or systems involvement: Secondary | ICD-10-CM

## 2018-07-04 DIAGNOSIS — J309 Allergic rhinitis, unspecified: Secondary | ICD-10-CM

## 2018-07-04 DIAGNOSIS — Z87891 Personal history of nicotine dependence: Secondary | ICD-10-CM

## 2018-07-04 DIAGNOSIS — J42 Unspecified chronic bronchitis: Secondary | ICD-10-CM | POA: Diagnosis not present

## 2018-07-04 DIAGNOSIS — J849 Interstitial pulmonary disease, unspecified: Secondary | ICD-10-CM | POA: Insufficient documentation

## 2018-07-04 LAB — PULMONARY FUNCTION TEST
DL/VA % PRED: 78 %
DL/VA: 3.46 ml/min/mmHg/L
DLCO cor % pred: 54 %
DLCO cor: 15.33 ml/min/mmHg
DLCO unc % pred: 52 %
DLCO unc: 14.88 ml/min/mmHg
FEF 25-75 Post: 1.12 L/sec
FEF 25-75 Pre: 1.17 L/sec
FEF2575-%Change-Post: -3 %
FEF2575-%PRED-PRE: 61 %
FEF2575-%Pred-Post: 59 %
FEV1-%Change-Post: 0 %
FEV1-%PRED-PRE: 70 %
FEV1-%Pred-Post: 70 %
FEV1-POST: 1.86 L
FEV1-PRE: 1.87 L
FEV1FVC-%Change-Post: 1 %
FEV1FVC-%Pred-Pre: 98 %
FEV6-%Change-Post: -1 %
FEV6-%PRED-PRE: 76 %
FEV6-%Pred-Post: 74 %
FEV6-Post: 2.58 L
FEV6-Pre: 2.63 L
FEV6FVC-%Change-Post: 0 %
FEV6FVC-%PRED-POST: 107 %
FEV6FVC-%Pred-Pre: 107 %
FVC-%CHANGE-POST: -1 %
FVC-%PRED-POST: 70 %
FVC-%PRED-PRE: 71 %
FVC-POST: 2.59 L
FVC-PRE: 2.64 L
PRE FEV1/FVC RATIO: 71 %
PRE FEV6/FVC RATIO: 100 %
Post FEV1/FVC ratio: 72 %
Post FEV6/FVC ratio: 100 %
RV % PRED: 109 %
RV: 2.64 L
TLC % PRED: 81 %
TLC: 5.26 L

## 2018-07-04 NOTE — Assessment & Plan Note (Signed)
Continue not smoking

## 2018-07-04 NOTE — Progress Notes (Signed)
Discussed results with patient in office.  Nothing further is needed at this time.  Velma Hanna FNP  

## 2018-07-04 NOTE — Assessment & Plan Note (Signed)
We will follow-up with Dr. Trudie Reed regarding your CT and pulmonary function test results  Follow-up with our office in 3 months

## 2018-07-04 NOTE — Assessment & Plan Note (Signed)
Follow-up with our office in 3 months  Continue Breo Ellipta 100 >>> Take 1 puff daily in the morning right when you wake up >>>Rinse your mouth out after use >>>This is a daily maintenance inhaler, NOT a rescue inhaler >>>Contact our office if you are having difficulties affording or obtaining this medication >>>It is important for you to be able to take this daily and not miss any doses   Continue Spiriva Respimat 2.5 >>> 2 puffs daily >>> Do this every day >>>This is not a rescue inhaler  I would recommend that you start a daily antihistamine >>>Choose from Zyrtec, Claritin, Allegra >>>Choose 1 >>>Can get the generic >>>Take daily  Can use Flonase as well if nasal congestion does not improve  Continue nasal saline rinses

## 2018-07-04 NOTE — Progress Notes (Addendum)
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 Pertinent hx includes HTN, COPD, OSA on CPAP, DOE, GERD, Rheumatoid arthritis, Prediabetic.   Anesthesia history: Post-op on the evening of PLIF 02/17/17, pt was on CPAP and developed hypoxia with O2 stats in the low 80's.  Placed on BiPAP, stay in ICU stepdown. Thought to be due to post-op atelectasis, OSA, acute pain, COPD, possible aspiration with narcotic pain use.  Pt recently underwent right hip gluteal tendon repair 11/02/2017 without apparent complication.  Pt has pulmonary clearance by Dr. Lake Bells stating pt is at moderate surgical risk.  Anticipate he can proceed as planned barring acute status change.  VS: BP 137/73   Pulse 88   Temp 36.7 C (Oral)   Resp 18   Ht 5' 7.5" (1.715 m)   Wt 104.4 kg   SpO2 95%   BMI 35.52 kg/m   PROVIDERS: Javier Glazier, MD is PCP  [name], NP is Pulmonology provider last seen 06/13/2018  LABS: Labs reviewed: Acceptable for surgery. (all labs ordered are listed, but only abnormal results are displayed)  Labs Reviewed  HEMOGLOBIN A1C - Abnormal; Notable for the following components:      Result Value   Hgb A1c MFr Bld 5.9 (*)    All other components within normal limits  COMPREHENSIVE METABOLIC PANEL - Abnormal; Notable for the following components:   Glucose, Bld 100 (*)    Calcium 8.7 (*)    All other components within normal limits  CBC - Abnormal; Notable for the following  components:   RBC 3.83 (*)    MCV 109.4 (*)    MCH 35.5 (*)    All other components within normal limits  GLUCOSE, CAPILLARY - Abnormal; Notable for the following components:   Glucose-Capillary 115 (*)    All other components within normal limits  SURGICAL PCR SCREEN  TYPE AND SCREEN     IMAGES: CT Chest 06/29/2018: IMPRESSION: 1. 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 Findings asymmetrically involve the right lung. Findings are new since 2006 chest CT, minimally progressed since 2008 chest CT and stable from most recent 12/19/2017 screening chest CT. 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 2. Left upper lobe 5 mm solid pulmonary nodule is stable since 03/25 screening chest CT. Please see the 12/19/2017 screening chest CT report for follow-up recommendations. 3. Mild-to-moderate centrilobular and paraseptal emphysema with diffuse bronchial wall thickening, suggesting COPD. 4. Three-vessel coronary atherosclerosis. 5. Stable dilated main pulmonary artery, suggesting chronic pulmonary arterial hypertension.   EKG: 07/03/2018 (better tracing in MUSE, tracing in Epic with sig artifact): Sinus Rhythm with PACs. LAD. Cannot rule out inferior infarct, age undetermined.   CV: Pharmacologic stress test 04/22/2016: Conclusions: 1.  Normal myocardial perfusion study without evidence of inducible ischemia or prior infarction. 2.  Normal left ventricular wall motion and segmental function. 3.  Post stress ejection fraction is  measured at 71%.  Past Medical History:  Diagnosis Date  . Chronic lower back pain   . Complication of anesthesia 01/2017   was in ICU due to breathing complications-  . COPD (chronic obstructive pulmonary disease) ([place])   . Degenerative scoliosis in adult patient   . Depression   . Dyspnea    on exertion  .  GERD (gastroesophageal reflux disease)   . H/O seasonal allergies   . High cholesterol   . 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 History of kidney stones   . Hypertension   . 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 OSA on CPAP    wears CPAP  . Pneumonia    "I've had it several times; last time was end of Jan 2018" (05/02/2017)  . Pre-diabetes   . Prostate cancer ([place])   . Rheumatoid arteritis ([place])    "all over" (05/02/2017)  . Thrush 12/07/2017  . Wears glasses     Past Surgical History:  Procedure Laterality Date  . BACK SURGERY    . COLONOSCOPY W/ POLYPECTOMY    . FRACTURE SURGERY    . HERNIA REPAIR    . HIP ARTHROPLASTY Left 04/2016  . INGUINAL HERNIA REPAIR Left 1986  . JOINT REPLACEMENT    . LUMBAR WOUND DEBRIDEMENT N/A 04/18/2017   Procedure: REPAIR OF LUMBAR PSEUDOMENINGOCELE;  Surgeon: Newman Pies, MD;  Location: [facility];  Service: Neurosurgery;  Laterality: N/A;  REAIR OF LUMBAR PSEUDOMENINGOCELE  . LUMBAR WOUND DEBRIDEMENT N/A 05/05/2017   Procedure: I&D Lumbar Wound;  Surgeon: Newman Pies, MD;  Location: [facility];  Service: Neurosurgery;  Laterality: N/A;  . MAXIMUM ACCESS (MAS)POSTERIOR LUMBAR INTERBODY FUSION (PLIF) 3 LEVEL  02/17/2017   [name] 02/17/2017  . OPEN SURGICAL REPAIR OF GLUTEAL TENDON Right 11/02/2017   Procedure: Right hip gluteal tendon repair;  Surgeon: Gaynelle Arabian, MD;  Location: [facility];  Service: Orthopedics;  Laterality: Right;  . POSTERIOR LAMINECTOMY / DECOMPRESSION LUMBAR SPINE  2010  . PROSTATE BIOPSY    . PROSTATE BIOPSY  <2013 X 3  . SPLENECTOMY  1955  . TONSILLECTOMY    . TOTAL HIP ARTHROPLASTY Right 10/2015  . UMBILICAL HERNIA REPAIR  05/2016    MEDICATIONS: . albuterol (PROVENTIL HFA;VENTOLIN HFA) 108 (90 Base) MCG/ACT inhaler  . BREO ELLIPTA 100-25 MCG/INH AEPB  . ciprofloxacin (CIPRO) 750 MG tablet  . docusate sodium (COLACE) 250 MG capsule  . DULoxetine (CYMBALTA)  60 MG capsule  . famotidine (PEPCID) 40 MG tablet  . finasteride (PROSCAR) 5 MG tablet  . fluconazole (DIFLUCAN) 100 MG tablet  . fluticasone (FLONASE) 50 MCG/ACT nasal spray  . fluticasone furoate-vilanterol (BREO ELLIPTA) 100-25 MCG/INH AEPB  . folic acid (FOLVITE) 1 MG tablet  . gabapentin (NEURONTIN) 300 MG capsule  . ibuprofen (ADVIL,MOTRIN) 200 MG tablet  . losartan (COZAAR) 50 MG tablet  . methotrexate 2.5 MG tablet  . metoprolol succinate (TOPROL-XL) 25 MG 24 hr tablet  . predniSONE (DELTASONE) 5 MG tablet  . sennosides-docusate sodium (SENOKOT-S) 8.6-50 MG tablet  . simvastatin (ZOCOR) 40 MG tablet  . sodium chloride (OCEAN) 0.65 % SOLN nasal spray  . tamsulosin (FLOMAX) 0.4 MG CAPS capsule  . Tiotropium Bromide Monohydrate (SPIRIVA RESPIMAT) 2.5 MCG/ACT AERS  . Tiotropium Bromide Monohydrate (SPIRIVA RESPIMAT) 2.5 MCG/ACT AERS  . traZODone (DESYREL) 50 MG tablet   No current facility-administered medications for this encounter.    Karoline Caldwell, PA-C [facility] Short Stay Center/Anesthesiology  Phone [phone] 07/04/2018 5:02 PM

## 2018-07-04 NOTE — Progress Notes (Signed)
PFT done today. 

## 2018-07-04 NOTE — Assessment & Plan Note (Signed)
  I would recommend that you start a daily antihistamine >>>Choose from Zyrtec, Claritin, Allegra >>>Choose 1 >>>Can get the generic >>>Take daily  Can use Flonase as well if nasal congestion does not improve  Continue nasal saline rinses

## 2018-07-04 NOTE — Progress Notes (Signed)
_0  ID: [name], male    DOB: December 24, 1940, 77 y.o.   MRN: [id number]  Chief Complaint  Patient presents with  . Follow-up    with PFT, rev CT results     Referring provider: Javier Glazier, MD  HPI:  77 year old male former smoker referred to our office in 2018.  Managed in our office for COPD and obstructive sleep apnea (managed on CPAP).  PMH: Rheumatoid arthritis Smoker/ Smoking History: Former Smoker, Quit 2012, 81 pack years Maintenance:  Breo Ellipta 100, Spiriva 2.5 Pt of: Dr. Lake Bells  07/04/2018  - Visit   77 year old patient presenting today for follow-up visit after completing pulmonary function test.  Pulmonary function test today reveals:  07/04/2018-pulmonary function test- FVC 2.64 (71% predicted), ratio 71, FEV1 70, no significant bronchodilator response, DLCO 54 >>> Patient took Breo Ellipta 100 and Spiriva 2.5 this morning  CPAP compliance report today showing great compliance 30 out of 30 days use.  All 30 of those days greater than 4 hours, average usage 11 hours and 12 minutes, CPAP set pressure of 8, AHI 1.2  Patient reports his symptoms have been stable still having occasional shortness of breath and dyspnea.  Patient is proceeding forward with surgery with Dr. [name] for his back.  This is tentatively scheduled for 07/10/2018.  Patient needs to schedule follow-up with Dr. Trudie Reed as he canceled his upcoming appointment due to the back surgery.   Tests:  Pulmonary function testing: >>>October 2015 from [facility] ratio 58% FEV1 1.54 L 57% predicted, FVC 2.65 L 70% predicted, total lung capacity 157% predicted residual volume 263% predicted DLCO 61% predic   07/04/2018-pulmonary function test- FVC 2.64 (71% predicted), ratio 71, FEV1 70, no significant bronchodilator response, DLCO 54  Chest imaging: March 2018 CT chest lung cancer screening: Rads 2, images independently reviewed showing nonspecific scarring more in the right lung base than  the left, not consistent with an interstitial lung disease 12/19/2017-CT chest lung cancer screening-lung RADS 2, follow-up in 12 months 05/30/2018-chest x-ray-stable bilateral interstitial prominence suggesting chronic interstitial lung disease 06/29/2018-CT chest high-res- spectrum of findings compatible with 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 PSG on 04/25/2000 gave an index of 78/hr.  Ever since then he has  been using CPAP at 8 C. WP  FENO:  No results found for: NITRICOXIDE  PFT: PFT Results Latest Ref Rng & Units 07/04/2018  FVC-Pre L 2.64  FVC-Predicted Pre % 71  FVC-Post L 2.59  FVC-Predicted Post % 70  Pre FEV1/FVC % % 71  Post FEV1/FCV % % 72  FEV1-Pre L 1.87  FEV1-Predicted Pre % 70  DLCO UNC% % 52  DLCO COR %Predicted % 78    Imaging: Ct Chest High Resolution  Result Date: 06/29/2018 CLINICAL DATA:  Dyspnea on exertion and cough for several months. Rales on exam. Former smoker. EXAM: CT CHEST WITHOUT CONTRAST TECHNIQUE: Multidetector CT imaging of the chest was performed following the standard protocol without intravenous contrast. High resolution imaging of the lungs, as well as inspiratory and expiratory imaging, was performed. COMPARISON:  05/30/2018 chest radiograph. 12/19/2017 screening chest CT. FINDINGS: Cardiovascular: Top-normal heart size. No significant pericardial effusion/thickening. Three-vessel coronary atherosclerosis. Atherosclerotic nonaneurysmal thoracic aorta. Stable dilated main pulmonary artery (3.6 cm diameter). Mediastinum/Nodes: No discrete thyroid nodules. Unremarkable esophagus. No pathologically enlarged axillary,  mediastinal or hilar lymph nodes, noting limited sensitivity for the detection of hilar adenopathy  on this noncontrast study. Nonenlarged coarsely calcified AP window, subcarinal and left hilar nodes from prior granulomatous disease, unchanged. Lungs/Pleura: No pneumothorax. No pleural effusion. No acute consolidative airspace disease or lung masses. Stable coarsely calcified granulomas scattered in the left lung. Peripheral left upper lobe 5 mm solid pulmonary nodule (series 5/image 89), stable. No new significant pulmonary nodules. There is patchy subpleural reticulation and ground-glass attenuation in both lungs with associated mild traction bronchiectasis, subpleural lines and mild architectural distortion. These findings asymmetrically involve the right lung. There is a mild basilar gradient to these findings. No frank honeycombing. These findings are new since 07/16/2005 chest CT, appears slightly worse compared to 12/16/2016 screening chest CT and are not appreciably changed from 12/19/2017 screening chest CT. No significant lobular air trapping on the expiration sequence. Mild-to-moderate centrilobular and paraseptal emphysema with diffuse bronchial wall thickening. Upper abdomen: Scattered granulomatous liver and splenic calcifications. Musculoskeletal: No aggressive appearing focal osseous lesions. Marked thoracic spondylosis. Partial visualization of fixation hardware in the lumbar spine. IMPRESSION: 1. 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 Findings asymmetrically involve the right lung. Findings are new since 2006 chest CT, minimally progressed since 2008 chest CT and stable from most recent 12/19/2017 screening chest CT. 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 2. Left upper lobe 5 mm solid pulmonary nodule is stable since 03/25 screening chest CT. Please see the 12/19/2017 screening chest CT report for follow-up recommendations. 3. Mild-to-moderate  centrilobular and paraseptal emphysema with diffuse bronchial wall thickening, suggesting COPD. 4. Three-vessel coronary atherosclerosis. 5. Stable dilated main pulmonary artery, suggesting chronic pulmonary arterial hypertension. Aortic Atherosclerosis (ICD10-I70.0) and Emphysema (ICD10-J43.9). Electronically Signed   By: Ilona Sorrel M.D.   On: 06/29/2018 12:09    Chart Review:    Specialty Problems      Pulmonary Problems   GOLD COPD II B    Pulmonary function testing: October 2015 from [facility] ratio 58% FEV1 1.54 L 57% predicted, FVC 2.65 L 70% predicted, total lung capacity 157% predicted residual volume 263% predicted DLCO 61% predicted  07/04/2018-pulmonary function test- FVC 2.64 (71% predicted), ratio 71, FEV1 70, no significant bronchodilator response, DLCO 54       OSA on CPAP    >>>N. PSG on 04/25/2000 gave an index of 78/hr.  Ever since then he has  been using CPAP at 8 C. 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 Sulfa Antibiotics Shortness Of Breath  . Sulfasalazine Shortness Of Breath  . Lisinopril Cough  . Varenicline Other (See Comments)  Strange thoughts    Immunization History  Administered Date(s) Administered  . 19-influenza Whole  07/18/2012, 06/14/2014, 08/12/2015, 06/03/2016  . HiB (PRP-T) 08/23/2017  . Influenza, High Dose Seasonal PF 06/12/2018  . Influenza,inj,Quad PF,6+ Mos 06/28/2017  . Influenza-Unspecified 07/18/2012, 06/14/2014, 08/12/2015, 06/03/2016, 06/28/2017  . Meningococcal B, OMV 08/23/2017, 09/21/2017  . Meningococcal Conjugate 08/23/2017  . Meningococcal Mcv4o 08/23/2017, 10/18/2017  . Pneumococcal Conjugate-13 11/28/2013, 11/28/2013  . Pneumococcal Polysaccharide-23 12/26/2005, 09/13/2012  . Pneumococcal-Unspecified 12/26/2005, 09/13/2012  . Tdap 11/28/2013, 11/28/2013  . Zoster 06/06/2012, 06/06/2012    Past Medical History:  Diagnosis Date  . Chronic lower back pain   . Complication of anesthesia 01/2017   was in ICU due to breathing complications-  . COPD (chronic obstructive pulmonary disease) ([place])   . Degenerative scoliosis in adult patient   . Depression   . Dyspnea    on exertion  . GERD (gastroesophageal reflux disease)   . H/O seasonal allergies   . High cholesterol   . 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 History of kidney stones   . Hypertension   . 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 OSA on CPAP    wears CPAP  . Pneumonia    "I've had it several times; last time was end of Jan 2018" (05/02/2017)  . Pre-diabetes   . Prostate cancer ([place])   . 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 Thrush 12/07/2017  . Wears glasses     Tobacco History: Social History   Tobacco Use  Smoking Status Former Smoker  . Packs/day: 1.50  . Years: 54.00  . Pack years: 81.00  . Types: Cigarettes  . Last attempt to quit: 09/27/2014  . Years since quitting: 3.7  Smokeless Tobacco Never Used  Tobacco Comment   05/02/2017 "quit vaping 09/2016"   Counseling given: Not Answered Comment: 05/02/2017 "quit vaping 09/2016"  Continue not smoking.   Outpatient Encounter Medications as of 07/04/2018  Medication Sig    . albuterol (PROVENTIL HFA;VENTOLIN HFA) 108 (90 Base) MCG/ACT inhaler Inhale 2 puffs into the lungs every 6 (six) hours as needed for wheezing or shortness of breath.  Marland Kitchen BREO ELLIPTA 100-25 MCG/INH AEPB INHALE 1 PUFF INTO THE LUNGS ONCE DAILY (Patient taking differently: Inhale 1 puff into the lungs daily. )  . ciprofloxacin (CIPRO) 750 MG tablet TAKE ONE TABLET BY MOUTH TWICE DAILY (Patient taking differently: Take 750 mg by mouth 2 (two) times daily. )  . docusate sodium (COLACE) 250 MG capsule Take 250-500 mg by mouth daily.  . DULoxetine (CYMBALTA) 60 MG capsule Take 60 mg by mouth every evening.   . famotidine (PEPCID) 40 MG tablet Take 1 tablet (40 mg total) by mouth daily.  . finasteride (PROSCAR) 5 MG tablet Take 5 mg by mouth at bedtime.   . fluconazole (DIFLUCAN) 100 MG tablet Take 1 tablet (100 mg total) by mouth daily.  . fluticasone (FLONASE) 50 MCG/ACT nasal spray Place 2 sprays into both nostrils daily as needed for allergies.   . folic acid (FOLVITE) 1 MG tablet Take 1 mg by mouth daily.  Marland Kitchen gabapentin (NEURONTIN) 300 MG capsule Take 600 mg by mouth 3 (three) times daily.  Marland Kitchen ibuprofen (ADVIL,MOTRIN) 200 MG tablet Take 400 mg by mouth 3 (three) times daily.  Marland Kitchen losartan (COZAAR) 50 MG tablet Take 1 tablet (50 mg total) by mouth daily.  . methotrexate 2.5 MG tablet Take 25 mg  by mouth every Saturday.   . metoprolol succinate (TOPROL-XL) 25 MG 24 hr tablet Take 25 mg by mouth at bedtime.  . predniSONE (DELTASONE) 5 MG tablet Take 5 mg by mouth daily with breakfast.  . sennosides-docusate sodium (SENOKOT-S) 8.6-50 MG tablet Take 2 tablets by mouth at bedtime as needed (for constipation.).   Marland Kitchen simvastatin (ZOCOR) 40 MG tablet Take 40 mg by mouth at bedtime.   . sodium chloride (OCEAN) 0.65 % SOLN nasal spray Place 1 spray into both nostrils as needed for congestion.  . tamsulosin (FLOMAX) 0.4 MG CAPS capsule Take 0.4 mg by mouth at bedtime.   . Tiotropium Bromide Monohydrate (SPIRIVA  RESPIMAT) 2.5 MCG/ACT AERS Inhale 2 puffs into the lungs daily.  . traZODone (DESYREL) 50 MG tablet Take 50 mg by mouth at bedtime.  . [DISCONTINUED] fluticasone furoate-vilanterol (BREO ELLIPTA) 100-25 MCG/INH AEPB Inhale 1 puff into the lungs daily.  . [DISCONTINUED] Tiotropium Bromide Monohydrate (SPIRIVA RESPIMAT) 2.5 MCG/ACT AERS Inhale 2 puffs into the lungs daily.   No facility-administered encounter medications on file as of 07/04/2018.      Review of Systems  Review of Systems  Constitutional: Positive for fatigue. Negative for activity change, chills, fever and unexpected weight change.  HENT: Positive for congestion and postnasal drip. Negative for rhinorrhea, sinus pressure and sore throat.   Eyes: Negative.   Respiratory: Positive for shortness of breath. Negative for cough and wheezing.   Cardiovascular: Negative for chest pain and palpitations.  Gastrointestinal: Positive for constipation. Negative for diarrhea, nausea and vomiting.  Endocrine: Negative.   Musculoskeletal: Negative.   Skin: Negative.   Allergic/Immunologic: Positive for environmental allergies.  Neurological: Negative for dizziness and headaches.  Psychiatric/Behavioral: Negative.  Negative for dysphoric mood. The patient is not nervous/anxious.   All other systems reviewed and are negative.    Physical Exam  BP 130/72 (BP Location: Left Arm, Cuff Size: Normal)   Pulse (!) 109   Ht _0  (1.702 m)   Wt 230 lb (104.3 kg)   SpO2 90%   BMI 36.02 kg/m   Wt Readings from Last 5 Encounters:  07/04/18 230 lb (104.3 kg)  07/03/18 230 lb 3.2 oz (104.4 kg)  06/13/18 229 lb 9.6 oz (104.1 kg)  05/30/18 234 lb 9.6 oz (106.4 kg)  03/20/18 234 lb (106.1 kg)     Physical Exam  Constitutional: He is oriented to person, place, and time and well-developed, well-nourished, and in no distress. No distress.  HENT:  Head: Normocephalic and atraumatic.  Right Ear: Hearing, tympanic membrane, external ear and  ear canal normal.  Left Ear: Hearing, tympanic membrane, external ear and ear canal normal.  Nose: Nose normal. Right sinus exhibits no maxillary sinus tenderness and no frontal sinus tenderness. Left sinus exhibits no maxillary sinus tenderness and no frontal sinus tenderness.  Mouth/Throat: Uvula is midline and oropharynx is clear and moist. No oropharyngeal exudate.  Eyes: Pupils are equal, round, and reactive to light.  Neck: Normal range of motion. Neck supple. No JVD present.  Cardiovascular: Normal rate, regular rhythm and normal heart sounds.  Pulmonary/Chest: Effort normal. No accessory muscle usage. No respiratory distress. He has no decreased breath sounds. He has no wheezes. He has no rhonchi. He has rales (Right base crackles ).  Abdominal: Soft. Bowel sounds are normal. There is tenderness (slight tenderness throught exam, all 4 quadrants, pt reports this is chronic).  Musculoskeletal: Normal range of motion. He exhibits no edema.  Lymphadenopathy:    He  has no cervical adenopathy.  Neurological: He is alert and oriented to person, place, and time. Gait normal.  Skin: Skin is warm and dry. He is not diaphoretic. No erythema.  Psychiatric: Mood, memory, affect and judgment normal.  Nursing note and vitals reviewed.     Lab Results:  CBC    Component Value Date/Time   WBC 8.2 07/03/2018 1055   RBC 3.83 (L) 07/03/2018 1055   HGB 13.6 07/03/2018 1055   HCT 41.9 07/03/2018 1055   PLT 271 07/03/2018 1055   MCV 109.4 (H) 07/03/2018 1055   MCH 35.5 (H) 07/03/2018 1055   MCHC 32.5 07/03/2018 1055   RDW 15.2 07/03/2018 1055   LYMPHSABS 2,195 12/07/2017 1041   EOSABS 246 12/07/2017 1041   BASOSABS 56 12/07/2017 1041    BMET    Component Value Date/Time   NA 135 07/03/2018 1055   K 4.0 07/03/2018 1055   CL 103 07/03/2018 1055   CO2 25 07/03/2018 1055   GLUCOSE 100 (H) 07/03/2018 1055   BUN 19 07/03/2018 1055   CREATININE 0.94 07/03/2018 1055   CREATININE 0.89  03/20/2018 1054   CALCIUM 8.7 (L) 07/03/2018 1055   GFRNONAA >60 07/03/2018 1055   GFRNONAA 84 03/20/2018 1054   GFRAA >60 07/03/2018 1055   GFRAA 97 03/20/2018 1054    BNP No results found for: BNP  ProBNP No results found for: PROBNP    Assessment & Plan:   Pleasant 77 year old patient seen in office visit today.  Believe it is okay for patient to proceed forward for surgery.  Patient is in moderate pulmonary risk based off of ILD seen on CT, underlying COPD, reduced DLCO on PFT.  Major Pulmonary risks identified in the multifactorial risk analysis are but not limited to a) pneumonia; b) recurrent intubation risk; c) prolonged or recurrent acute respiratory failure needing mechanical ventilation; d) prolonged hospitalization; e) DVT/Pulmonary embolism; f) Acute Pulmonary edema  Recommend 1. Short duration of surgery as much as possible and avoid paralytic if possible 2. Recovery in step down or ICU with Pulmonary consultation 3. DVT prophylaxis 4. Aggressive pulmonary toilet with o2, bronchodilatation, and incentive spirometry and early ambulation  Patient to keep regular follow-up with Dr. Trudie Reed.  I believe at this time is reasonable for patient to continue with methotrexate.  Patient to continue Breo Ellipta 100 and Spiriva 2.5 as prescribed.  Patient will need to follow-up with our office in 3 months.   Rheumatoid arthritis ([place]) Continue follow-up with Dr. Trudie Reed  Former tobacco use Continue not smoking  OSA on CPAP We recommend that you continue using your CPAP daily >>>Keep up the hard work using your device >>> Goal should be wearing this for the entire night that you are sleeping, at least 4 to 6 hours  Remember:  . Do not drive or operate heavy machinery if tired or drowsy.  . Please notify the supply company and office if you are unable to use your device regularly due to missing supplies or machine being broken.  . Work on maintaining a healthy weight and  following your recommended nutrition plan  . Maintain proper daily exercise and movement  . Maintaining proper use of your device can also help improve management of other chronic illnesses such as: Blood pressure, blood sugars, and weight management.   BiPAP/ CPAP Cleaning:  >>>Clean weekly, with Dawn soap, and bottle brush.  Set up to air dry.    GOLD COPD II B Follow-up with our office in 3  months  Continue Breo Ellipta 100 >>> Take 1 puff daily in the morning right when you wake up >>>Rinse your mouth out after use >>>This is a daily maintenance inhaler, NOT a rescue inhaler >>>Contact our office if you are having difficulties affording or obtaining this medication >>>It is important for you to be able to take this daily and not miss any doses   Continue Spiriva Respimat 2.5 >>> 2 puffs daily >>> Do this every day >>>This is not a rescue inhaler  I would recommend that you start a daily antihistamine >>>Choose from Zyrtec, Claritin, Allegra >>>Choose 1 >>>Can get the generic >>>Take daily  Can use Flonase as well if nasal congestion does not improve  Continue nasal saline rinses   Allergic rhinitis  I would recommend that you start a daily antihistamine >>>Choose from Zyrtec, Claritin, Allegra >>>Choose 1 >>>Can get the generic >>>Take daily  Can use Flonase as well if nasal congestion does not improve  Continue nasal saline rinses   ILD (interstitial lung disease) ([facility]) We will follow-up with Dr. Trudie Reed regarding your CT and pulmonary function test results  Follow-up with our office in 3 months   This appointment was 44 minutes along with her 50% that time direct face-to-face patient care, assessment, plan of care follow-up, review of CT, review of pulmonary function test, discussion of recommendations for surgery.   Lauraine Rinne, NP 07/04/2018

## 2018-07-04 NOTE — Assessment & Plan Note (Signed)

## 2018-07-04 NOTE — Assessment & Plan Note (Signed)
Continue follow-up with Dr. Trudie Reed

## 2018-07-04 NOTE — Patient Instructions (Signed)
We will follow-up with Dr. Trudie Reed regarding your CT and pulmonary function test results  Okay to proceed forward with surgery with Dr. [name]  Follow-up with our office in 3 months  Continue Breo Ellipta 100 >>> Take 1 puff daily in the morning right when you wake up >>>Rinse your mouth out after use >>>This is a daily maintenance inhaler, NOT a rescue inhaler >>>Contact our office if you are having difficulties affording or obtaining this medication >>>It is important for you to be able to take this daily and not miss any doses   Continue Spiriva Respimat 2.5 >>> 2 puffs daily >>> Do this every day >>>This is not a rescue inhaler  I would recommend that you start a daily antihistamine >>>Choose from Zyrtec, Claritin, Allegra >>>Choose 1 >>>Can get the generic >>>Take daily  Can use Flonase as well if nasal congestion does not improve  Continue nasal saline rinses       November/2019 we will be moving! We will no longer be at our [place] location.  Be on the look out for a post card/mailer to let you know we have officially moved.  Our new address and phone number will be:  [address] [place], [place] [address] Telephone number: [phone]  It is flu season:   >>>Remember to be washing your hands regularly, using hand sanitizer, be careful to use around herself with has contact with people who are sick will increase her chances of getting sick yourself. >>> Best ways to protect herself from the flu: Receive the yearly flu vaccine, practice good hand hygiene washing with soap and also using hand sanitizer when available, eat a nutritious meals, get adequate rest, hydrate appropriately   Please contact the office if your symptoms worsen or you have concerns that you are not improving.   Thank you for choosing [facility] for your healthcare, and for allowing [place] to partner with you on your healthcare journey. I am thankful to be able to provide care  to you today.   [name] FNP-C

## 2018-07-04 NOTE — Progress Notes (Signed)
Discussed results with patient in office.  Nothing further is needed at this time.  Brian Mack FNP  

## 2018-07-06 ENCOUNTER — Other Ambulatory Visit: Payer: Self-pay | Admitting: Infectious Disease

## 2018-07-06 DIAGNOSIS — T847XXD Infection and inflammatory reaction due to other internal orthopedic prosthetic devices, implants and grafts, subsequent encounter: Secondary | ICD-10-CM

## 2018-07-07 ENCOUNTER — Telehealth: Payer: Self-pay

## 2018-07-07 NOTE — Telephone Encounter (Signed)
Dr. Lake Bells,         I called and spoke to patient. Patient stated that he is unable to make an appointment for 3-4 weeks. Patient reported that he is having back surgery on Monday, 07/10/18.  Patient reported that when he is feeling better and thinks he would be able to make it in he will call the office to schedule a follow up visit.   [name]

## 2018-07-07 NOTE — Telephone Encounter (Signed)
-----  Message from Juanito Doom, MD sent at 07/07/2018  3:49 PM EDT -----   ----- Message ----- From: Lauraine Rinne, NP Sent: 07/04/2018   5:08 PM EDT To: Juanito Doom, MD  PFT showing reduced DLCO - 54.  High-res CT with relatively stable ILD from 2008 CT.  Patient on methotrexate sees Dr. Trudie Reed.  Patient proceeding forward with surgery with Dr. [name]. [name]

## 2018-07-07 NOTE — Progress Notes (Signed)
Reviewed, agree  BJ, please make sure he has an appointment with me in the next 3-4 weeks.

## 2018-07-10 ENCOUNTER — Inpatient Hospital Stay (HOSPITAL_COMMUNITY): Payer: Medicare Other | Admitting: Vascular Surgery

## 2018-07-10 ENCOUNTER — Inpatient Hospital Stay (HOSPITAL_COMMUNITY): Payer: Medicare Other

## 2018-07-10 ENCOUNTER — Encounter (HOSPITAL_COMMUNITY)
Admission: RE | Disposition: A | Discharge status: Skilled Nursing Facility | Payer: Self-pay | Source: Home / Self Care | Attending: Neurosurgery

## 2018-07-10 ENCOUNTER — Inpatient Hospital Stay (HOSPITAL_COMMUNITY): Payer: Medicare Other | Admitting: Physician Assistant

## 2018-07-10 ENCOUNTER — Other Ambulatory Visit: Payer: Self-pay

## 2018-07-10 ENCOUNTER — Inpatient Hospital Stay (HOSPITAL_COMMUNITY)
Admission: RE | Admit: 2018-07-10 | Discharge: 2018-07-17 | DRG: 454 | Disposition: A | Discharge status: Skilled Nursing Facility | Payer: Medicare Other | Attending: Neurosurgery | Admitting: Neurosurgery

## 2018-07-10 ENCOUNTER — Encounter (HOSPITAL_COMMUNITY): Payer: Self-pay | Admitting: General Practice

## 2018-07-10 DIAGNOSIS — G8929 Other chronic pain: Secondary | ICD-10-CM | POA: Diagnosis present

## 2018-07-10 DIAGNOSIS — D72829 Elevated white blood cell count, unspecified: Secondary | ICD-10-CM | POA: Diagnosis not present

## 2018-07-10 DIAGNOSIS — Z419 Encounter for procedure for purposes other than remedying health state, unspecified: Secondary | ICD-10-CM

## 2018-07-10 DIAGNOSIS — R7303 Prediabetes: Secondary | ICD-10-CM | POA: Diagnosis present

## 2018-07-10 DIAGNOSIS — I776 Arteritis, unspecified: Secondary | ICD-10-CM | POA: Diagnosis present

## 2018-07-10 DIAGNOSIS — Z981 Arthrodesis status: Secondary | ICD-10-CM | POA: Diagnosis not present

## 2018-07-10 DIAGNOSIS — Z8249 Family history of ischemic heart disease and other diseases of the circulatory system: Secondary | ICD-10-CM

## 2018-07-10 DIAGNOSIS — Z87442 Personal history of urinary calculi: Secondary | ICD-10-CM

## 2018-07-10 DIAGNOSIS — R600 Localized edema: Secondary | ICD-10-CM | POA: Diagnosis present

## 2018-07-10 DIAGNOSIS — I4892 Unspecified atrial flutter: Secondary | ICD-10-CM | POA: Diagnosis not present

## 2018-07-10 DIAGNOSIS — I361 Nonrheumatic tricuspid (valve) insufficiency: Secondary | ICD-10-CM | POA: Diagnosis not present

## 2018-07-10 DIAGNOSIS — I959 Hypotension, unspecified: Secondary | ICD-10-CM | POA: Diagnosis not present

## 2018-07-10 DIAGNOSIS — Z7951 Long term (current) use of inhaled steroids: Secondary | ICD-10-CM

## 2018-07-10 DIAGNOSIS — Z973 Presence of spectacles and contact lenses: Secondary | ICD-10-CM

## 2018-07-10 DIAGNOSIS — I10 Essential (primary) hypertension: Secondary | ICD-10-CM | POA: Diagnosis present

## 2018-07-10 DIAGNOSIS — E669 Obesity, unspecified: Secondary | ICD-10-CM | POA: Diagnosis present

## 2018-07-10 DIAGNOSIS — M5116 Intervertebral disc disorders with radiculopathy, lumbar region: Principal | ICD-10-CM | POA: Diagnosis present

## 2018-07-10 DIAGNOSIS — R0600 Dyspnea, unspecified: Secondary | ICD-10-CM | POA: Diagnosis not present

## 2018-07-10 DIAGNOSIS — M5136 Other intervertebral disc degeneration, lumbar region: Secondary | ICD-10-CM

## 2018-07-10 DIAGNOSIS — G4733 Obstructive sleep apnea (adult) (pediatric): Secondary | ICD-10-CM | POA: Diagnosis present

## 2018-07-10 DIAGNOSIS — I2510 Atherosclerotic heart disease of native coronary artery without angina pectoris: Secondary | ICD-10-CM | POA: Diagnosis present

## 2018-07-10 DIAGNOSIS — J449 Chronic obstructive pulmonary disease, unspecified: Secondary | ICD-10-CM | POA: Diagnosis present

## 2018-07-10 DIAGNOSIS — K219 Gastro-esophageal reflux disease without esophagitis: Secondary | ICD-10-CM | POA: Diagnosis present

## 2018-07-10 DIAGNOSIS — Z882 Allergy status to sulfonamides status: Secondary | ICD-10-CM

## 2018-07-10 DIAGNOSIS — Z79899 Other long term (current) drug therapy: Secondary | ICD-10-CM

## 2018-07-10 DIAGNOSIS — M954 Acquired deformity of chest and rib: Secondary | ICD-10-CM | POA: Diagnosis present

## 2018-07-10 DIAGNOSIS — I471 Supraventricular tachycardia: Secondary | ICD-10-CM | POA: Diagnosis not present

## 2018-07-10 DIAGNOSIS — E7800 Pure hypercholesterolemia, unspecified: Secondary | ICD-10-CM | POA: Diagnosis present

## 2018-07-10 DIAGNOSIS — M48062 Spinal stenosis, lumbar region with neurogenic claudication: Secondary | ICD-10-CM | POA: Diagnosis present

## 2018-07-10 DIAGNOSIS — Z791 Long term (current) use of non-steroidal anti-inflammatories (NSAID): Secondary | ICD-10-CM

## 2018-07-10 DIAGNOSIS — R0902 Hypoxemia: Secondary | ICD-10-CM | POA: Diagnosis not present

## 2018-07-10 DIAGNOSIS — Z87891 Personal history of nicotine dependence: Secondary | ICD-10-CM

## 2018-07-10 DIAGNOSIS — I480 Paroxysmal atrial fibrillation: Secondary | ICD-10-CM | POA: Diagnosis not present

## 2018-07-10 DIAGNOSIS — M4316 Spondylolisthesis, lumbar region: Secondary | ICD-10-CM

## 2018-07-10 DIAGNOSIS — Z888 Allergy status to other drugs, medicaments and biological substances status: Secondary | ICD-10-CM

## 2018-07-10 DIAGNOSIS — I370 Nonrheumatic pulmonary valve stenosis: Secondary | ICD-10-CM | POA: Diagnosis not present

## 2018-07-10 DIAGNOSIS — F329 Major depressive disorder, single episode, unspecified: Secondary | ICD-10-CM | POA: Diagnosis present

## 2018-07-10 DIAGNOSIS — M4150 Other secondary scoliosis, site unspecified: Secondary | ICD-10-CM | POA: Diagnosis present

## 2018-07-10 DIAGNOSIS — M4310 Spondylolisthesis, site unspecified: Secondary | ICD-10-CM | POA: Diagnosis present

## 2018-07-10 DIAGNOSIS — Z7952 Long term (current) use of systemic steroids: Secondary | ICD-10-CM

## 2018-07-10 DIAGNOSIS — I8390 Asymptomatic varicose veins of unspecified lower extremity: Secondary | ICD-10-CM | POA: Diagnosis present

## 2018-07-10 DIAGNOSIS — G8918 Other acute postprocedural pain: Secondary | ICD-10-CM

## 2018-07-10 DIAGNOSIS — I4891 Unspecified atrial fibrillation: Secondary | ICD-10-CM | POA: Diagnosis present

## 2018-07-10 DIAGNOSIS — M549 Dorsalgia, unspecified: Secondary | ICD-10-CM | POA: Diagnosis present

## 2018-07-10 DIAGNOSIS — Z6836 Body mass index (BMI) 36.0-36.9, adult: Secondary | ICD-10-CM

## 2018-07-10 DIAGNOSIS — J302 Other seasonal allergic rhinitis: Secondary | ICD-10-CM | POA: Diagnosis present

## 2018-07-10 DIAGNOSIS — Z96643 Presence of artificial hip joint, bilateral: Secondary | ICD-10-CM | POA: Diagnosis present

## 2018-07-10 DIAGNOSIS — Z9081 Acquired absence of spleen: Secondary | ICD-10-CM

## 2018-07-10 DIAGNOSIS — M51369 Other intervertebral disc degeneration, lumbar region without mention of lumbar back pain or lower extremity pain: Secondary | ICD-10-CM

## 2018-07-10 LAB — GLUCOSE, CAPILLARY: GLUCOSE-CAPILLARY: 105 mg/dL — AB (ref 70–99)

## 2018-07-10 SURGERY — POSTERIOR LUMBAR FUSION 1 LEVEL
Anesthesia: General | Site: Spine Lumbar

## 2018-07-10 MED ORDER — CYCLOBENZAPRINE HCL 10 MG PO TABS
10.0000 mg | ORAL_TABLET | Freq: Three times a day (TID) | ORAL | Status: DC | PRN
  Administered 2018-07-10 – 2018-07-14 (×6): 10 mg via ORAL
  Filled 2018-07-10 (×5): qty 1

## 2018-07-10 MED ORDER — ALBUTEROL SULFATE (2.5 MG/3ML) 0.083% IN NEBU: 3.0000 mL | INHALATION_SOLUTION | Freq: Four times a day (QID) | RESPIRATORY_TRACT | Status: DC | PRN

## 2018-07-10 MED ORDER — TAMSULOSIN HCL 0.4 MG PO CAPS
0.4000 mg | ORAL_CAPSULE | Freq: Every day | ORAL | Status: DC
  Administered 2018-07-10 – 2018-07-16 (×7): 0.4 mg via ORAL
  Filled 2018-07-10 (×7): qty 1

## 2018-07-10 MED ORDER — 0.9 % SODIUM CHLORIDE (POUR BTL) OPTIME
TOPICAL | Status: DC | PRN
  Administered 2018-07-10: 1000 mL

## 2018-07-10 MED ORDER — VANCOMYCIN HCL 1000 MG IV SOLR
INTRAVENOUS | Status: AC
  Filled 2018-07-10: qty 1000

## 2018-07-10 MED ORDER — LIDOCAINE HCL (CARDIAC) PF 100 MG/5ML IV SOSY
PREFILLED_SYRINGE | INTRAVENOUS | Status: DC | PRN
  Administered 2018-07-10: 60 mg via INTRAVENOUS

## 2018-07-10 MED ORDER — LACTATED RINGERS IV SOLN
INTRAVENOUS | Status: DC | PRN
  Administered 2018-07-10 (×2): via INTRAVENOUS

## 2018-07-10 MED ORDER — CHLORHEXIDINE GLUCONATE CLOTH 2 % EX PADS: 6.0000 | MEDICATED_PAD | Freq: Once | CUTANEOUS | Status: DC

## 2018-07-10 MED ORDER — FENTANYL CITRATE (PF) 100 MCG/2ML IJ SOLN
INTRAMUSCULAR | Status: AC
  Filled 2018-07-10: qty 2

## 2018-07-10 MED ORDER — ROCURONIUM BROMIDE 50 MG/5ML IV SOSY
PREFILLED_SYRINGE | INTRAVENOUS | Status: AC
  Filled 2018-07-10: qty 5

## 2018-07-10 MED ORDER — GABAPENTIN 300 MG PO CAPS
600.0000 mg | ORAL_CAPSULE | Freq: Three times a day (TID) | ORAL | Status: DC
  Administered 2018-07-10 – 2018-07-17 (×21): 600 mg via ORAL
  Filled 2018-07-10 (×21): qty 2

## 2018-07-10 MED ORDER — METOPROLOL SUCCINATE ER 25 MG PO TB24
25.0000 mg | ORAL_TABLET | Freq: Every day | ORAL | Status: DC
  Administered 2018-07-10 – 2018-07-12 (×3): 25 mg via ORAL
  Filled 2018-07-10 (×3): qty 1

## 2018-07-10 MED ORDER — DEXAMETHASONE SODIUM PHOSPHATE 10 MG/ML IJ SOLN
INTRAMUSCULAR | Status: AC
  Filled 2018-07-10: qty 1

## 2018-07-10 MED ORDER — SIMVASTATIN 40 MG PO TABS
40.0000 mg | ORAL_TABLET | Freq: Every day | ORAL | Status: DC
  Administered 2018-07-10 – 2018-07-13 (×4): 40 mg via ORAL
  Filled 2018-07-10 (×4): qty 1

## 2018-07-10 MED ORDER — FOLIC ACID 1 MG PO TABS
1.0000 mg | ORAL_TABLET | Freq: Every day | ORAL | Status: DC
  Administered 2018-07-11 – 2018-07-17 (×7): 1 mg via ORAL
  Filled 2018-07-10 (×7): qty 1

## 2018-07-10 MED ORDER — HYDROMORPHONE HCL 1 MG/ML IJ SOLN
INTRAMUSCULAR | Status: AC
  Filled 2018-07-10: qty 0.5

## 2018-07-10 MED ORDER — LACTATED RINGERS IV SOLN
INTRAVENOUS | Status: DC
  Administered 2018-07-10: 10:00:00 via INTRAVENOUS

## 2018-07-10 MED ORDER — HYDROMORPHONE HCL 1 MG/ML IJ SOLN
INTRAMUSCULAR | Status: DC | PRN
  Administered 2018-07-10: 0.5 mg via INTRAVENOUS

## 2018-07-10 MED ORDER — SUGAMMADEX SODIUM 500 MG/5ML IV SOLN
INTRAVENOUS | Status: AC
  Filled 2018-07-10: qty 5

## 2018-07-10 MED ORDER — TRAZODONE HCL 50 MG PO TABS
50.0000 mg | ORAL_TABLET | Freq: Every day | ORAL | Status: DC
  Administered 2018-07-10 – 2018-07-16 (×7): 50 mg via ORAL
  Filled 2018-07-10 (×7): qty 1

## 2018-07-10 MED ORDER — FAMOTIDINE 20 MG PO TABS
40.0000 mg | ORAL_TABLET | Freq: Every day | ORAL | Status: DC
  Administered 2018-07-11 – 2018-07-17 (×7): 40 mg via ORAL
  Filled 2018-07-10 (×7): qty 2

## 2018-07-10 MED ORDER — ALBUTEROL SULFATE (2.5 MG/3ML) 0.083% IN NEBU
2.5000 mg | INHALATION_SOLUTION | Freq: Four times a day (QID) | RESPIRATORY_TRACT | Status: DC | PRN
  Administered 2018-07-10: 2.5 mg via RESPIRATORY_TRACT

## 2018-07-10 MED ORDER — BUPIVACAINE-EPINEPHRINE 0.5% -1:200000 IJ SOLN
INTRAMUSCULAR | Status: AC
  Filled 2018-07-10: qty 1

## 2018-07-10 MED ORDER — CEFAZOLIN SODIUM-DEXTROSE 2-4 GM/100ML-% IV SOLN
2.0000 g | INTRAVENOUS | Status: AC
  Administered 2018-07-10 (×2): 2 g via INTRAVENOUS

## 2018-07-10 MED ORDER — CYCLOBENZAPRINE HCL 10 MG PO TABS
ORAL_TABLET | ORAL | Status: AC
  Administered 2018-07-11: 10 mg via ORAL
  Filled 2018-07-10: qty 1

## 2018-07-10 MED ORDER — SUCCINYLCHOLINE CHLORIDE 20 MG/ML IJ SOLN
INTRAMUSCULAR | Status: DC | PRN
  Administered 2018-07-10: 120 mg via INTRAVENOUS

## 2018-07-10 MED ORDER — SODIUM CHLORIDE 0.9 % IV SOLN
250.0000 mL | INTRAVENOUS | Status: DC
  Administered 2018-07-10: 250 mL via INTRAVENOUS

## 2018-07-10 MED ORDER — SODIUM CHLORIDE 0.9 % IV SOLN
INTRAVENOUS | Status: DC | PRN
  Administered 2018-07-10: 500 mL

## 2018-07-10 MED ORDER — DEXAMETHASONE SODIUM PHOSPHATE 4 MG/ML IJ SOLN
INTRAMUSCULAR | Status: DC | PRN
  Administered 2018-07-10: 5 mg via INTRAVENOUS

## 2018-07-10 MED ORDER — HYDROCORTISONE NA SUCCINATE PF 250 MG IJ SOLR
INTRAMUSCULAR | Status: AC
  Filled 2018-07-10: qty 250

## 2018-07-10 MED ORDER — ESMOLOL HCL 100 MG/10ML IV SOLN
INTRAVENOUS | Status: AC
  Filled 2018-07-10: qty 10

## 2018-07-10 MED ORDER — PHENYLEPHRINE 40 MCG/ML (10ML) SYRINGE FOR IV PUSH (FOR BLOOD PRESSURE SUPPORT)
PREFILLED_SYRINGE | INTRAVENOUS | Status: AC
  Filled 2018-07-10: qty 10

## 2018-07-10 MED ORDER — KETOROLAC TROMETHAMINE 30 MG/ML IJ SOLN
INTRAMUSCULAR | Status: AC
  Filled 2018-07-10: qty 1

## 2018-07-10 MED ORDER — SALINE SPRAY 0.65 % NA SOLN
1.0000 | NASAL | Status: DC | PRN
  Filled 2018-07-10: qty 44

## 2018-07-10 MED ORDER — DULOXETINE HCL 60 MG PO CPEP
60.0000 mg | ORAL_CAPSULE | Freq: Every evening | ORAL | Status: DC
  Administered 2018-07-10 – 2018-07-17 (×8): 60 mg via ORAL
  Filled 2018-07-10 (×8): qty 1

## 2018-07-10 MED ORDER — CEFAZOLIN SODIUM-DEXTROSE 2-4 GM/100ML-% IV SOLN
2.0000 g | Freq: Three times a day (TID) | INTRAVENOUS | Status: AC
  Administered 2018-07-11 (×2): 2 g via INTRAVENOUS
  Filled 2018-07-10 (×2): qty 100

## 2018-07-10 MED ORDER — LOSARTAN POTASSIUM 50 MG PO TABS
50.0000 mg | ORAL_TABLET | Freq: Every day | ORAL | Status: DC
  Administered 2018-07-11 – 2018-07-15 (×5): 50 mg via ORAL
  Filled 2018-07-10 (×7): qty 1

## 2018-07-10 MED ORDER — UMECLIDINIUM BROMIDE 62.5 MCG/INH IN AEPB
1.0000 | INHALATION_SPRAY | Freq: Every day | RESPIRATORY_TRACT | Status: DC
  Filled 2018-07-10: qty 7

## 2018-07-10 MED ORDER — OXYCODONE HCL 5 MG PO TABS
10.0000 mg | ORAL_TABLET | ORAL | Status: DC | PRN
  Administered 2018-07-10 – 2018-07-17 (×22): 10 mg via ORAL
  Filled 2018-07-10 (×23): qty 2

## 2018-07-10 MED ORDER — PHENYLEPHRINE HCL 10 MG/ML IJ SOLN
INTRAMUSCULAR | Status: DC | PRN
  Administered 2018-07-10: 120 ug via INTRAVENOUS
  Administered 2018-07-10: 80 ug via INTRAVENOUS

## 2018-07-10 MED ORDER — FLUTICASONE PROPIONATE 50 MCG/ACT NA SUSP
2.0000 | Freq: Every day | NASAL | Status: DC | PRN
  Filled 2018-07-10: qty 16

## 2018-07-10 MED ORDER — HYDROCORTISONE NA SUCCINATE PF 100 MG IJ SOLR
50.0000 mg | Freq: Three times a day (TID) | INTRAMUSCULAR | Status: AC
  Administered 2018-07-10 – 2018-07-12 (×6): 50 mg via INTRAVENOUS
  Filled 2018-07-10 (×6): qty 2

## 2018-07-10 MED ORDER — ACETAMINOPHEN 650 MG RE SUPP: 650.0000 mg | RECTAL | Status: DC | PRN

## 2018-07-10 MED ORDER — UMECLIDINIUM BROMIDE 62.5 MCG/INH IN AEPB
1.0000 | INHALATION_SPRAY | Freq: Every day | RESPIRATORY_TRACT | Status: DC
  Administered 2018-07-11 – 2018-07-17 (×7): 1 via RESPIRATORY_TRACT
  Filled 2018-07-10 (×2): qty 7

## 2018-07-10 MED ORDER — LIDOCAINE 2% (20 MG/ML) 5 ML SYRINGE
INTRAMUSCULAR | Status: AC
  Filled 2018-07-10: qty 5

## 2018-07-10 MED ORDER — FENTANYL CITRATE (PF) 100 MCG/2ML IJ SOLN
25.0000 ug | INTRAMUSCULAR | Status: DC | PRN
  Administered 2018-07-10 (×4): 25 ug via INTRAVENOUS

## 2018-07-10 MED ORDER — THROMBIN 5000 UNITS EX SOLR
CUTANEOUS | Status: AC
  Filled 2018-07-10: qty 5000

## 2018-07-10 MED ORDER — ONDANSETRON HCL 4 MG/2ML IJ SOLN
INTRAMUSCULAR | Status: DC | PRN
  Administered 2018-07-10: 4 mg via INTRAVENOUS

## 2018-07-10 MED ORDER — PREDNISONE 5 MG PO TABS
5.0000 mg | ORAL_TABLET | Freq: Every day | ORAL | Status: DC
  Administered 2018-07-13 – 2018-07-17 (×5): 5 mg via ORAL
  Filled 2018-07-10 (×5): qty 1

## 2018-07-10 MED ORDER — FENTANYL CITRATE (PF) 250 MCG/5ML IJ SOLN
INTRAMUSCULAR | Status: AC
  Filled 2018-07-10: qty 5

## 2018-07-10 MED ORDER — SUGAMMADEX SODIUM 200 MG/2ML IV SOLN
INTRAVENOUS | Status: DC | PRN
  Administered 2018-07-10: 200 mg via INTRAVENOUS

## 2018-07-10 MED ORDER — BISACODYL 10 MG RE SUPP
10.0000 mg | Freq: Every day | RECTAL | Status: DC | PRN
  Administered 2018-07-14 – 2018-07-15 (×2): 10 mg via RECTAL
  Filled 2018-07-10 (×2): qty 1

## 2018-07-10 MED ORDER — ACETAMINOPHEN 500 MG PO TABS
1000.0000 mg | ORAL_TABLET | Freq: Four times a day (QID) | ORAL | Status: AC
  Administered 2018-07-10 – 2018-07-11 (×4): 1000 mg via ORAL
  Filled 2018-07-10 (×4): qty 2

## 2018-07-10 MED ORDER — TIOTROPIUM BROMIDE MONOHYDRATE 2.5 MCG/ACT IN AERS: 2.0000 | INHALATION_SPRAY | Freq: Every day | RESPIRATORY_TRACT | Status: DC

## 2018-07-10 MED ORDER — EPHEDRINE SULFATE 50 MG/ML IJ SOLN
INTRAMUSCULAR | Status: DC | PRN
  Administered 2018-07-10: 10 mg via INTRAVENOUS

## 2018-07-10 MED ORDER — BUPIVACAINE LIPOSOME 1.3 % IJ SUSP
20.0000 mL | INTRAMUSCULAR | Status: AC
  Administered 2018-07-10: 20 mL
  Filled 2018-07-10: qty 20

## 2018-07-10 MED ORDER — BACITRACIN ZINC 500 UNIT/GM EX OINT
TOPICAL_OINTMENT | CUTANEOUS | Status: DC | PRN
  Administered 2018-07-10: 1 via TOPICAL

## 2018-07-10 MED ORDER — ONDANSETRON HCL 4 MG/2ML IJ SOLN
4.0000 mg | Freq: Four times a day (QID) | INTRAMUSCULAR | Status: DC | PRN
  Administered 2018-07-15: 4 mg via INTRAVENOUS
  Filled 2018-07-10: qty 2

## 2018-07-10 MED ORDER — SODIUM CHLORIDE 0.9 % IV SOLN
INTRAVENOUS | Status: DC | PRN
  Administered 2018-07-10: 25 ug/min via INTRAVENOUS
  Administered 2018-07-10: 14:00:00 via INTRAVENOUS

## 2018-07-10 MED ORDER — BUPIVACAINE-EPINEPHRINE (PF) 0.5% -1:200000 IJ SOLN
INTRAMUSCULAR | Status: DC | PRN
  Administered 2018-07-10: 10 mL

## 2018-07-10 MED ORDER — VANCOMYCIN HCL 1 G IV SOLR
INTRAVENOUS | Status: DC | PRN
  Administered 2018-07-10: 1000 mg via TOPICAL

## 2018-07-10 MED ORDER — ALBUTEROL SULFATE (2.5 MG/3ML) 0.083% IN NEBU
INHALATION_SOLUTION | RESPIRATORY_TRACT | Status: AC
  Filled 2018-07-10: qty 3

## 2018-07-10 MED ORDER — BACITRACIN ZINC 500 UNIT/GM EX OINT
TOPICAL_OINTMENT | CUTANEOUS | Status: AC
  Filled 2018-07-10: qty 28.35

## 2018-07-10 MED ORDER — ONDANSETRON HCL 4 MG/2ML IJ SOLN
INTRAMUSCULAR | Status: AC
  Filled 2018-07-10: qty 2

## 2018-07-10 MED ORDER — ROCURONIUM BROMIDE 100 MG/10ML IV SOLN
INTRAVENOUS | Status: DC | PRN
  Administered 2018-07-10 (×5): 20 mg via INTRAVENOUS
  Administered 2018-07-10: 50 mg via INTRAVENOUS
  Administered 2018-07-10: 10 mg via INTRAVENOUS

## 2018-07-10 MED ORDER — OXYCODONE HCL 5 MG PO TABS
ORAL_TABLET | ORAL | Status: AC
  Filled 2018-07-10: qty 1

## 2018-07-10 MED ORDER — SODIUM CHLORIDE 0.9% FLUSH: 3.0000 mL | INTRAVENOUS | Status: DC | PRN

## 2018-07-10 MED ORDER — ONDANSETRON HCL 4 MG PO TABS: 4.0000 mg | ORAL_TABLET | Freq: Four times a day (QID) | ORAL | Status: DC | PRN

## 2018-07-10 MED ORDER — PROPOFOL 10 MG/ML IV BOLUS
INTRAVENOUS | Status: AC
  Filled 2018-07-10: qty 20

## 2018-07-10 MED ORDER — PHENYLEPHRINE HCL 10 MG/ML IJ SOLN
INTRAMUSCULAR | Status: AC
  Filled 2018-07-10: qty 1

## 2018-07-10 MED ORDER — FINASTERIDE 5 MG PO TABS
5.0000 mg | ORAL_TABLET | Freq: Every day | ORAL | Status: DC
  Administered 2018-07-10 – 2018-07-16 (×7): 5 mg via ORAL
  Filled 2018-07-10 (×7): qty 1

## 2018-07-10 MED ORDER — PHENOL 1.4 % MT LIQD: 1.0000 | OROMUCOSAL | Status: DC | PRN

## 2018-07-10 MED ORDER — ACETAMINOPHEN 325 MG PO TABS: 650.0000 mg | ORAL_TABLET | ORAL | Status: DC | PRN

## 2018-07-10 MED ORDER — CEFAZOLIN SODIUM-DEXTROSE 2-4 GM/100ML-% IV SOLN
INTRAVENOUS | Status: AC
  Filled 2018-07-10: qty 100

## 2018-07-10 MED ORDER — MORPHINE SULFATE (PF) 4 MG/ML IV SOLN: 4.0000 mg | INTRAVENOUS | Status: DC | PRN

## 2018-07-10 MED ORDER — FLUTICASONE FUROATE-VILANTEROL 100-25 MCG/INH IN AEPB
1.0000 | INHALATION_SPRAY | Freq: Every day | RESPIRATORY_TRACT | Status: DC
  Administered 2018-07-11 – 2018-07-17 (×7): 1 via RESPIRATORY_TRACT
  Filled 2018-07-10: qty 28

## 2018-07-10 MED ORDER — LACTATED RINGERS IV SOLN: INTRAVENOUS | Status: DC | PRN

## 2018-07-10 MED ORDER — SODIUM CHLORIDE 0.9% FLUSH
3.0000 mL | Freq: Two times a day (BID) | INTRAVENOUS | Status: DC
  Administered 2018-07-11 – 2018-07-17 (×12): 3 mL via INTRAVENOUS

## 2018-07-10 MED ORDER — FENTANYL CITRATE (PF) 250 MCG/5ML IJ SOLN
INTRAMUSCULAR | Status: DC | PRN
  Administered 2018-07-10: 150 ug via INTRAVENOUS
  Administered 2018-07-10 (×7): 50 ug via INTRAVENOUS

## 2018-07-10 MED ORDER — THROMBIN 5000 UNITS EX SOLR
OROMUCOSAL | Status: DC | PRN
  Administered 2018-07-10 (×2): 5 mL via TOPICAL

## 2018-07-10 MED ORDER — OXYCODONE HCL 5 MG PO TABS
5.0000 mg | ORAL_TABLET | ORAL | Status: DC | PRN
  Administered 2018-07-10 – 2018-07-17 (×5): 5 mg via ORAL
  Filled 2018-07-10 (×3): qty 1

## 2018-07-10 MED ORDER — MENTHOL 3 MG MT LOZG: 1.0000 | LOZENGE | OROMUCOSAL | Status: DC | PRN

## 2018-07-10 MED ORDER — PROPOFOL 10 MG/ML IV BOLUS
INTRAVENOUS | Status: DC | PRN
  Administered 2018-07-10: 140 mg via INTRAVENOUS

## 2018-07-10 MED ORDER — ESMOLOL HCL 100 MG/10ML IV SOLN
INTRAVENOUS | Status: DC | PRN
  Administered 2018-07-10: 30 mg via INTRAVENOUS

## 2018-07-10 MED ORDER — CIPROFLOXACIN HCL 500 MG PO TABS
750.0000 mg | ORAL_TABLET | Freq: Two times a day (BID) | ORAL | Status: DC
  Administered 2018-07-10 – 2018-07-17 (×14): 750 mg via ORAL
  Filled 2018-07-10 (×14): qty 1

## 2018-07-10 MED ORDER — ALBUMIN HUMAN 5 % IV SOLN
INTRAVENOUS | Status: DC | PRN
  Administered 2018-07-10 (×2): via INTRAVENOUS

## 2018-07-10 MED ORDER — DOCUSATE SODIUM 100 MG PO CAPS
200.0000 mg | ORAL_CAPSULE | Freq: Every day | ORAL | Status: DC
  Administered 2018-07-11 – 2018-07-17 (×7): 200 mg via ORAL
  Filled 2018-07-10 (×7): qty 2

## 2018-07-10 SURGICAL SUPPLY — 87 items
ADH SKN CLS APL DERMABOND .7 (GAUZE/BANDAGES/DRESSINGS)
APL SKNCLS STERI-STRIP NONHPOA (GAUZE/BANDAGES/DRESSINGS) ×1
BAG DECANTER FOR FLEXI CONT (MISCELLANEOUS) ×2 IMPLANT
BASKET BONE COLLECTION (BASKET) ×1 IMPLANT
BENZOIN TINCTURE PRP APPL 2/3 (GAUZE/BANDAGES/DRESSINGS) ×2 IMPLANT
BLADE CLIPPER SURG (BLADE) IMPLANT
BUR MATCHSTICK NEURO 3.0 LAGG (BURR) ×2 IMPLANT
BUR PRECISION FLUTE 6.0 (BURR) ×2 IMPLANT
CANISTER SUCT 3000ML PPV (MISCELLANEOUS) ×2 IMPLANT
CAP REVERE LOCKING (Cap) ×18 IMPLANT
CARTRIDGE OIL MAESTRO DRILL (MISCELLANEOUS) ×1 IMPLANT
CONN CROSSLINK REV 38-50MM (Connector) ×2 IMPLANT
CONN CROSSLINK REV 6.35 48-60 (Connector) ×2 IMPLANT
CONNECTOR CRSLINK REV 38-50MM (Connector) IMPLANT
CONNECTOR CRSLNK REV6.35 48-60 (Connector) IMPLANT
CONT SPEC 4OZ CLIKSEAL STRL BL (MISCELLANEOUS) ×2 IMPLANT
COVER BACK TABLE 60X90IN (DRAPES) ×2 IMPLANT
COVER WAND RF STERILE (DRAPES) ×2 IMPLANT
DECANTER SPIKE VIAL GLASS SM (MISCELLANEOUS) ×2 IMPLANT
DERMABOND ADVANCED (GAUZE/BANDAGES/DRESSINGS)
DERMABOND ADVANCED .7 DNX12 (GAUZE/BANDAGES/DRESSINGS) IMPLANT
DIFFUSER DRILL AIR PNEUMATIC (MISCELLANEOUS) ×2 IMPLANT
DRAPE C-ARM 42X72 X-RAY (DRAPES) ×4 IMPLANT
DRAPE HALF SHEET 40X57 (DRAPES) ×2 IMPLANT
DRAPE LAPAROTOMY 100X72X124 (DRAPES) ×2 IMPLANT
DRAPE SURG 17X23 STRL (DRAPES) ×8 IMPLANT
DRSG OPSITE POSTOP 4X10 (GAUZE/BANDAGES/DRESSINGS) ×1 IMPLANT
DRSG OPSITE POSTOP 4X6 (GAUZE/BANDAGES/DRESSINGS) ×1 IMPLANT
ELECT BLADE 4.0 EZ CLEAN MEGAD (MISCELLANEOUS) ×2
ELECT REM PT RETURN 9FT ADLT (ELECTROSURGICAL) ×2
ELECTRODE BLDE 4.0 EZ CLN MEGD (MISCELLANEOUS) ×1 IMPLANT
ELECTRODE REM PT RTRN 9FT ADLT (ELECTROSURGICAL) ×1 IMPLANT
EVACUATOR 1/8 PVC DRAIN (DRAIN) IMPLANT
GAUZE 4X4 16PLY RFD (DISPOSABLE) ×2 IMPLANT
GAUZE SPONGE 4X4 12PLY STRL (GAUZE/BANDAGES/DRESSINGS) ×1 IMPLANT
GLOVE BIO SURGEON STRL SZ8 (GLOVE) ×4 IMPLANT
GLOVE BIO SURGEON STRL SZ8.5 (GLOVE) ×4 IMPLANT
GLOVE BIOGEL PI IND STRL 6.5 (GLOVE) IMPLANT
GLOVE BIOGEL PI IND STRL 7.0 (GLOVE) IMPLANT
GLOVE BIOGEL PI IND STRL 7.5 (GLOVE) IMPLANT
GLOVE BIOGEL PI INDICATOR 6.5 (GLOVE) ×1
GLOVE BIOGEL PI INDICATOR 7.0 (GLOVE) ×2
GLOVE BIOGEL PI INDICATOR 7.5 (GLOVE) ×1
GLOVE EXAM NITRILE LRG STRL (GLOVE) IMPLANT
GLOVE EXAM NITRILE XL STR (GLOVE) IMPLANT
GLOVE EXAM NITRILE XS STR PU (GLOVE) IMPLANT
GLOVE SS BIOGEL STRL SZ 7 (GLOVE) IMPLANT
GLOVE SUPERSENSE BIOGEL SZ 7 (GLOVE) ×3
GLOVE SURG SS PI 6.0 STRL IVOR (GLOVE) ×2 IMPLANT
GOWN STRL REUS W/ TWL LRG LVL3 (GOWN DISPOSABLE) IMPLANT
GOWN STRL REUS W/ TWL XL LVL3 (GOWN DISPOSABLE) ×2 IMPLANT
GOWN STRL REUS W/TWL 2XL LVL3 (GOWN DISPOSABLE) IMPLANT
GOWN STRL REUS W/TWL LRG LVL3 (GOWN DISPOSABLE) ×4
GOWN STRL REUS W/TWL XL LVL3 (GOWN DISPOSABLE) ×4
HEMOSTAT POWDER KIT SURGIFOAM (HEMOSTASIS) ×3 IMPLANT
KIT BASIN OR (CUSTOM PROCEDURE TRAY) ×2 IMPLANT
KIT INFUSE MEDIUM (Orthopedic Implant) ×1 IMPLANT
KIT TURNOVER KIT B (KITS) ×2 IMPLANT
MILL MEDIUM DISP (BLADE) ×2 IMPLANT
NDL HYPO 21X1.5 SAFETY (NEEDLE) IMPLANT
NEEDLE HYPO 21X1.5 SAFETY (NEEDLE) ×2 IMPLANT
NEEDLE HYPO 22GX1.5 SAFETY (NEEDLE) ×2 IMPLANT
NS IRRIG 1000ML POUR BTL (IV SOLUTION) ×2 IMPLANT
OIL CARTRIDGE MAESTRO DRILL (MISCELLANEOUS) ×2
PACK LAMINECTOMY NEURO (CUSTOM PROCEDURE TRAY) ×2 IMPLANT
PAD ARMBOARD 7.5X6 YLW CONV (MISCELLANEOUS) ×6 IMPLANT
PATTIES SURGICAL .5 X1 (DISPOSABLE) IMPLANT
ROD 500MM (Rod) ×1 IMPLANT
ROD 6.35 300MM (Rod) ×1 IMPLANT
SCREW 7.5X45MM (Screw) ×4 IMPLANT
SCREW REVERE 6.35 7.5X40 (Screw) ×6 IMPLANT
SPONGE LAP 4X18 RFD (DISPOSABLE) IMPLANT
SPONGE NEURO XRAY DETECT 1X3 (DISPOSABLE) IMPLANT
SPONGE SURGIFOAM ABS GEL 100 (HEMOSTASIS) IMPLANT
STRIP BIOACTIVE 10CC 25X100X4 (Miscellaneous) ×1 IMPLANT
STRIP BIOACTIVE 10CC 25X50X8 (Miscellaneous) ×1 IMPLANT
STRIP CLOSURE SKIN 1/2X4 (GAUZE/BANDAGES/DRESSINGS) ×4 IMPLANT
SUT VIC AB 1 CT1 18XBRD ANBCTR (SUTURE) ×2 IMPLANT
SUT VIC AB 1 CT1 8-18 (SUTURE) ×8
SUT VIC AB 2-0 CP2 18 (SUTURE) ×6 IMPLANT
SWAB COLLECTION DEVICE MRSA (MISCELLANEOUS) ×1 IMPLANT
SWAB CULTURE ESWAB REG 1ML (MISCELLANEOUS) ×1 IMPLANT
SYR 20CC LL (SYRINGE) ×1 IMPLANT
TOWEL GREEN STERILE (TOWEL DISPOSABLE) ×2 IMPLANT
TOWEL GREEN STERILE FF (TOWEL DISPOSABLE) ×2 IMPLANT
TRAY FOLEY MTR SLVR 16FR STAT (SET/KITS/TRAYS/PACK) ×2 IMPLANT
WATER STERILE IRR 1000ML POUR (IV SOLUTION) ×2 IMPLANT

## 2018-07-10 NOTE — Anesthesia Preprocedure Evaluation (Addendum)
Anesthesia Evaluation  Patient identified by MRN, date of birth, ID band Patient awake    Airway Mallampati: II  TM Distance: >3 FB     Dental   Pulmonary shortness of breath, sleep apnea , pneumonia, COPD, former smoker,    breath sounds clear to auscultation       Cardiovascular hypertension,  Rhythm:Regular Rate:Normal     Neuro/Psych    GI/Hepatic Neg liver ROS, GERD  ,  Endo/Other    Renal/GU negative Renal ROS     Musculoskeletal  (+) Arthritis ,   Abdominal   Peds  Hematology   Anesthesia Other Findings   Reproductive/Obstetrics                            Anesthesia Physical Anesthesia Plan  ASA: III  Anesthesia Plan: General   Post-op Pain Management:    Induction: Intravenous  PONV Risk Score and Plan: 2 and Midazolam, Treatment may vary due to age or medical condition and Ondansetron  Airway Management Planned: Oral ETT  Additional Equipment:   Intra-op Plan:   Post-operative Plan: Possible Post-op intubation/ventilation  Informed Consent: I have reviewed the patients History and Physical, chart, labs and discussed the procedure including the risks, benefits and alternatives for the proposed anesthesia with the patient or authorized representative who has indicated his/her understanding and acceptance.   Dental advisory given  Plan Discussed with: CRNA and Anesthesiologist  Anesthesia Plan Comments:        Anesthesia Quick Evaluation

## 2018-07-10 NOTE — Anesthesia Postprocedure Evaluation (Signed)
Anesthesia Post Note  Patient: [name]  Procedure(s) Performed: POSTERIOR LUMBAR INTERBODY FUSION, INTERBODY PROSTHESIS LUMBAR ONE- LUMBAR TWO, POSTERIOR INSTRUMENTATION /FUSION THORACIC NINE- LUMBAR FIVE (N/A Spine Lumbar)     Patient location during evaluation: PACU Anesthesia Type: General Level of consciousness: awake Pain management: pain level controlled Vital Signs Assessment: post-procedure vital signs reviewed and stable Respiratory status: spontaneous breathing Postop Assessment: no headache Anesthetic complications: no    Last Vitals:  Vitals:   07/10/18 0949 07/10/18 1720  BP: 130/87 124/86  Pulse: 89   Resp: 18 20  Temp: 36.4 C (P) 36.8 C  SpO2: 95%     Last Pain:  Vitals:   07/10/18 1010  TempSrc:   PainSc: 2                  Sudais Banghart

## 2018-07-10 NOTE — Op Note (Signed)
Brief history: The patient is a 77 year old white male on whom I performed an L2-3, L3-4 and L4-5 decompression, instrumentation and fusion.  He has developed recurrent back and leg pain.  He has failed medical management.  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  I discussed the various treatment options with the patient including surgery.  He has weighed the risks, benefits, and alternatives of surgery and decided proceed with a thoracolumbar decompression, instrumentation and fusion.  Preop diagnosis: Lumbar adjacent segment disease; lumbar herniated disks, lumbar stenosis, lumbar radiculopathy, neurogenic claudication; adult degenerative scoliosis  Postop diagnosis: The same  Procedure: L1-2 laminectomy and discectomy; exploration of lumbar fusion; posterior segmental instrumentation T9-L5 with globus titanium pedicle screws and rods; posterior lateral arthrodesis T9-10, T10-11, T11-12, T12-L1, L1-2, L2-3, L3-4 and L4-5 with local morselized autograft bone, bone morphogenic protein soaked collagen sponges, Kinnex bone graft extender  Surgeon: Dr. Earle Gell  Assistant: Dr. Granville Lewis  Anesthesia: General tracheal  Estimated blood loss: 350 cc  Specimens: None  Drains: None  Complications: None  Description of procedure: The patient was brought to the operating room by the anesthesia team.  General endotracheal anesthesia was induced.  The patient was carefully turned to the prone position on the [place] table.  The patient's thoracolumbar region was then prepared with Betadine scrub and Betadine solution.  Sterile drapes were applied.  I then injected the area to be incised with Marcaine with epinephrine solution.  I used the scalpel to make an incision through his previous lumbar scar extending up to the lower thoracic region.  I used electrocautery to perform a bilateral subperiosteal dissection exposing  the spinous processes and lamina of T9, T10, T11, T12, L1 and L2 and expose the old hardware from L2-L5.  I inserted the Versatrac retractor for exposure.  I explored the fusion by removing the cross connectors and the.  I then remove the rod.  I inspected the arthrodesis.  I did not see any clear bony growth posterior laterally and therefore decided to redo the posterior lateral fusion at L2-3, L3-4 and L4-5.  I now turned my attention to the discectomy/decompression.  I used a high-speed drill to perform bilateral L1 to laminotomies.  I carefully dissected the epidural tissue from the thecal sac.  We encountered a huge herniated disc at L1-2 which had migrated cephalad.  The disc herniation was quite adherent to the dura.  We carefully freed up the disc herniation and remove that multiple fragments with the pituitary forceps decompressing the thecal sac.  I attempted to gain access to the intervertebral disc at L1 to but the disc space was quite spondylotic and collapsed.  I therefore decided not to perform a posterior lumbar interbody fusion at this level.  Have completed the decompression now turned my attention to the instrumentation.  Under fluoroscopic guidance I cannulated the bilateral T9, T10, T11, T12 and L1 pedicles with a bone probe.  I tapped the pedicles bilaterally with a 6.5 mm tap.  I removed the tap and probed inside the tapped hole with a ball probe to rule out cortical breaches.  I inserted 7.5 x 40 and 45 mm pedicle screws into the bilateral T9, T10, T11, T12 and L1 pedicles.  We got good bony purchase.  We cut and contoured to rods to the appropriate configuration.  We connected the unilateral pedicle screws from T9-L5 with the rod.  We secured the  rod in place with the caps.  We placed a cross connectors between the rods.  This completed the instrumentation.  We now turned our attention to the posterior lateral arthrodesis.  We used a high-speed drill to decorticate the facets,  transverse process, lamina, etc. at T9-10, T10-11, T11-12, T12-L1, L1-2, L2-3, L3-4 and L4-5.  We laid a combination of local morselized autograft bone, bone morphogenic protein soaked collagen sponges and Kinnex bone graft extender over the decorticated posterior lateral structures to complete in the posterior lateral arthrodesis at T9-10, T10-11, T11-12, T12-L1, L1-2, L2-3, L3-4 and L4-5.  We then obtained hemostasis using bipolar cautery.  We removed the retractors.  We injected the soft tissues with Exparel.  We placed vancomycin into the wound.  We then reapproximated patient's thoracolumbar fascia with interrupted #1 Vicryl suture.  We reapproximated the subcutaneous tissue with interrupted 2-0 Vicryl suture.  We reapproximate the skin with Steri-Strips and benzoin.  The wound was then coated with bacitracin ointment.  A sterile dressing was applied.  The drapes were removed.  The patient was returned to the supine position.  By report all sponge, instrument, and needle counts were correct at the end of this case.

## 2018-07-10 NOTE — Transfer of Care (Signed)
Immediate Anesthesia Transfer of Care Note  Patient: [name]  Procedure(s) Performed: POSTERIOR LUMBAR INTERBODY FUSION, INTERBODY PROSTHESIS LUMBAR ONE- LUMBAR TWO, POSTERIOR INSTRUMENTATION /FUSION THORACIC NINE- LUMBAR FIVE (N/A Spine Lumbar)  Patient Location: PACU  Anesthesia Type:General  Level of Consciousness: awake, alert , oriented and patient cooperative  Airway & Oxygen Therapy: Patient Spontanous Breathing and Patient connected to face mask oxygen  Post-op Assessment: Report given to RN, Post -op Vital signs reviewed and stable and Patient moving all extremities X 4  Post vital signs: Reviewed and stable  Last Vitals:  Vitals Value Taken Time  BP 124/86 07/10/2018  5:20 PM  Temp    Pulse 104 07/10/2018  5:23 PM  Resp 19 07/10/2018  5:23 PM  SpO2 99 % 07/10/2018  5:23 PM  Vitals shown include unvalidated device data.  Last Pain:  Vitals:   07/10/18 1010  TempSrc:   PainSc: 2       Patients Stated Pain Goal: 2 (21/97/58 8325)  Complications: No apparent anesthesia complications

## 2018-07-10 NOTE — Progress Notes (Signed)
Subjective: The patient is alert and pleasant.  He looks well.  Objective: Vital signs in last 24 hours: Temp:  [97.6 F (36.4 C)] 97.6 F (36.4 C) (10/14 0949) Pulse Rate:  [89] 89 (10/14 0949) Resp:  [18-20] 20 (10/14 1720) BP: (124-130)/(86-87) 124/86 (10/14 1720) SpO2:  [95 %] 95 % (10/14 0949) Weight:  [104.3 kg] 104.3 kg (10/14 0949) Estimated body mass index is 36.02 kg/m as calculated from the following:   Height as of this encounter: _0  (1.702 m).   Weight as of this encounter: 104.3 kg.   Intake/Output from previous day: No intake/output data recorded. Intake/Output this shift: Total I/O In: 2800 [I.V.:2100; IV Piggyback:700] Out: 1340 [Urine:940; Blood:400]  Physical exam the patient is alert and pleasant.  He is moving his lower extremities well.  Lab Results: No results for input(s): WBC, HGB, HCT, PLT in the last 72 hours. BMET No results for input(s): NA, K, CL, CO2, GLUCOSE, BUN, CREATININE, CALCIUM in the last 72 hours.  Studies/Results: Dg Lumbar Spine 2-3 Views  Result Date: 07/10/2018 CLINICAL DATA:  T9-L5 fusion EXAM: LUMBAR SPINE - 2-3 VIEW; DG C-ARM 61-120 MIN COMPARISON:  Chest CT-06/29/2018 FINDINGS: Four spot intraoperative fluoroscopic images of the lower thoracic and lumbar spine are provided for review. Spinal labeling is difficult given the coned field of view and non definitive visualization of the lumbosacral junction. Provided images demonstrate sequela paraspinal fusion at least 2 level intervertebral disc space replacement. Provided images demonstrate anatomic alignment. Additional surgical support apparatus is seen posterior to the operative site. No definite radiopaque foreign body. IMPRESSION: Post long segment thoracolumbar paraspinal fusion and intervertebral disc space replacement. Electronically Signed   By: Sandi Mariscal M.D.   On: 07/10/2018 16:27   Dg Lumbar Spine 1 View  Result Date: 07/10/2018 CLINICAL DATA:  Elective surgery.   Localization of L1-2 PLIF. EXAM: LUMBAR SPINE - 1 VIEW COMPARISON:  MRI of the lumbar spine 05/18/2018 FINDINGS: Intraoperative imaging of lumbar spine demonstrates a surgical probe at the L1-2 disc level. IMPRESSION: Intraoperative localization of L1-2. Electronically Signed   By: San Morelle M.D.   On: 07/10/2018 16:48   Dg C-arm 1-60 Min  Result Date: 07/10/2018 CLINICAL DATA:  T9-L5 fusion EXAM: LUMBAR SPINE - 2-3 VIEW; DG C-ARM 61-120 MIN COMPARISON:  Chest CT-06/29/2018 FINDINGS: Four spot intraoperative fluoroscopic images of the lower thoracic and lumbar spine are provided for review. Spinal labeling is difficult given the coned field of view and non definitive visualization of the lumbosacral junction. Provided images demonstrate sequela paraspinal fusion at least 2 level intervertebral disc space replacement. Provided images demonstrate anatomic alignment. Additional surgical support apparatus is seen posterior to the operative site. No definite radiopaque foreign body. IMPRESSION: Post long segment thoracolumbar paraspinal fusion and intervertebral disc space replacement. Electronically Signed   By: Sandi Mariscal M.D.   On: 07/10/2018 16:27    Assessment/Plan: I spoke with the patient's family.  He is doing well.  LOS: 0 days     Ophelia Charter 07/10/2018, 5:49 PM

## 2018-07-10 NOTE — H&P (Signed)
Subjective:   The patient is a 77 year old white male on whom I previously performed an L2-3, L3-4 and L4-5 decompression, nstrumentation and fusion.  This was complicated by a infection which was treated with antibiotics.  The patient eventually made a good recovery but has developed recurrent back and leg pain.  He has failed medical management.  He was worked up with a lumbar MRI which demonstrated degenerative disc disease and stenosis at L1-2.  I discussed the various treatment option with the patient.  He has decided to proceed with surgery after weighing the risks, benefits and alternatives. Past Medical History:  Diagnosis Date  . Chronic lower back pain   . Complication of anesthesia 01/2017   was in ICU due to breathing complications-  . COPD (chronic obstructive pulmonary disease) ([place])   . Degenerative scoliosis in adult patient   . Depression   . Dyspnea    on exertion  . GERD (gastroesophageal reflux disease)   . H/O seasonal allergies   . High cholesterol   . 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 History of kidney stones   . Hypertension   . 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 OSA on CPAP    wears CPAP  . Pneumonia    "I've had it several times; last time was end of Jan 2018" (05/02/2017)  . Pre-diabetes   . Prostate cancer ([place])   . Rheumatoid arteritis ([place])    "all over" (05/02/2017)  . Thrush 12/07/2017  . Wears glasses     Past Surgical History:  Procedure Laterality Date  . BACK SURGERY    . COLONOSCOPY W/ POLYPECTOMY    . FRACTURE SURGERY    . HERNIA REPAIR    . HIP ARTHROPLASTY Left 04/2016  . INGUINAL HERNIA REPAIR Left 1986  . JOINT REPLACEMENT    . LUMBAR WOUND DEBRIDEMENT N/A 04/18/2017   Procedure: REPAIR OF LUMBAR PSEUDOMENINGOCELE;  Surgeon: Newman Pies, MD;  Location: [facility];  Service: Neurosurgery;  Laterality: N/A;  REAIR OF LUMBAR PSEUDOMENINGOCELE  . LUMBAR WOUND DEBRIDEMENT  N/A 05/05/2017   Procedure: I&D Lumbar Wound;  Surgeon: Newman Pies, MD;  Location: [facility];  Service: Neurosurgery;  Laterality: N/A;  . MAXIMUM ACCESS (MAS)POSTERIOR LUMBAR INTERBODY FUSION (PLIF) 3 LEVEL  02/17/2017   [name] 02/17/2017  . OPEN SURGICAL REPAIR OF GLUTEAL TENDON Right 11/02/2017   Procedure: Right hip gluteal tendon repair;  Surgeon: Gaynelle Arabian, MD;  Location: [facility];  Service: Orthopedics;  Laterality: Right;  . POSTERIOR LAMINECTOMY / DECOMPRESSION LUMBAR SPINE  2010  . PROSTATE BIOPSY    . PROSTATE BIOPSY  <2013 X 3  . SPLENECTOMY  1955  . TONSILLECTOMY    . TOTAL HIP ARTHROPLASTY Right 10/2015  . UMBILICAL HERNIA REPAIR  05/2016    Allergies  Allergen Reactions  . Sulfa Antibiotics Shortness Of Breath  . Sulfasalazine Shortness Of Breath  . Lisinopril Cough  . Varenicline Other (See Comments)    Strange thoughts    Social History   Tobacco Use  . Smoking status: Former Smoker    Packs/day: 1.50    Years: 54.00    Pack years: 81.00    Types: Cigarettes    Last attempt to quit: 09/27/2014    Years since quitting: 3.7  . Smokeless tobacco: Never Used  . Tobacco comment: 05/02/2017 "quit vaping 09/2016"  Substance Use Topics  . Alcohol use: Yes    Alcohol/week: 17.0 standard drinks  Types: 7 Cans of beer, 10 Shots of liquor per week    Comment: Daily shots, beer etc    Family History  Problem Relation Age of Onset  . Cancer Mother   . Heart disease Father   . Stroke Father    Prior to Admission medications   Medication Sig Start Date End Date Taking? Authorizing Provider  albuterol (PROVENTIL HFA;VENTOLIN HFA) 108 (90 Base) MCG/ACT inhaler Inhale 2 puffs into the lungs every 6 (six) hours as needed for wheezing or shortness of breath.   Yes [provider]  BREO ELLIPTA 100-25 MCG/INH AEPB INHALE 1 PUFF INTO THE LUNGS ONCE DAILY Patient taking differently: Inhale 1 puff into the lungs daily.  05/31/18  Yes Juanito Doom, MD   ciprofloxacin (CIPRO) 750 MG tablet TAKE ONE TABLET BY MOUTH TWICE DAILY 07/06/18  Yes Tommy Medal, Lavell Islam, MD  docusate sodium (COLACE) 250 MG capsule Take 250-500 mg by mouth daily.   Yes [provider]  DULoxetine (CYMBALTA) 60 MG capsule Take 60 mg by mouth every evening.    Yes [provider]  finasteride (PROSCAR) 5 MG tablet Take 5 mg by mouth at bedtime.    Yes [provider]  fluticasone (FLONASE) 50 MCG/ACT nasal spray Place 2 sprays into both nostrils daily as needed for allergies.    Yes [provider]  folic acid (FOLVITE) 1 MG tablet Take 1 mg by mouth daily.   Yes [provider]  gabapentin (NEURONTIN) 300 MG capsule Take 600 mg by mouth 3 (three) times daily.   Yes [provider]  ibuprofen (ADVIL,MOTRIN) 200 MG tablet Take 400 mg by mouth 3 (three) times daily.   Yes [provider]  losartan (COZAAR) 50 MG tablet Take 1 tablet (50 mg total) by mouth daily. 06/29/18  Yes Lauraine Rinne, NP  methotrexate 2.5 MG tablet Take 25 mg by mouth every Saturday.    Yes [provider]  metoprolol succinate (TOPROL-XL) 25 MG 24 hr tablet Take 25 mg by mouth at bedtime.   Yes [provider]  predniSONE (DELTASONE) 5 MG tablet Take 5 mg by mouth daily with breakfast.   Yes [provider]  sennosides-docusate sodium (SENOKOT-S) 8.6-50 MG tablet Take 2 tablets by mouth at bedtime as needed (for constipation.).    Yes [provider]  simvastatin (ZOCOR) 40 MG tablet Take 40 mg by mouth at bedtime.    Yes [provider]  sodium chloride (OCEAN) 0.65 % SOLN nasal spray Place 1 spray into both nostrils as needed for congestion.   Yes [provider]  tamsulosin (FLOMAX) 0.4 MG CAPS capsule Take 0.4 mg by mouth at bedtime.    Yes [provider]  Tiotropium Bromide Monohydrate (SPIRIVA RESPIMAT) 2.5 MCG/ACT AERS Inhale 2 puffs into the lungs daily. 07/05/17  Yes  Juanito Doom, MD  traZODone (DESYREL) 50 MG tablet Take 50 mg by mouth at bedtime.   Yes [provider]  famotidine (PEPCID) 40 MG tablet Take 1 tablet (40 mg total) by mouth daily. 05/08/17   Kristeen Miss, MD  fluconazole (DIFLUCAN) 100 MG tablet Take 1 tablet (100 mg total) by mouth daily. 12/07/17   Truman Hayward, MD     Review of Systems  Positive ROS: As above  All other systems have been reviewed and were otherwise negative with the exception of those mentioned in the HPI and as above.  Objective: Vital signs in last 24 hours:  Temp:  [97.6 F (36.4 C)] 97.6 F (36.4 C) (10/14 0949) Pulse Rate:  [89] 89 (10/14 0949) Resp:  [18] 18 (10/14 0949) BP: (130)/(87) 130/87 (10/14 0949) SpO2:  [95 %] 95 % (10/14 0949) Weight:  [104.3 kg] 104.3 kg (10/14 0949) Estimated body mass index is 36.02 kg/m as calculated from the following:   Height as of this encounter: _0  (1.702 m).   Weight as of this encounter: 104.3 kg.   General Appearance: Alert Head: Normocephalic, without obvious abnormality, atraumatic Eyes: PERRL, conjunctiva/corneas clear, EOM's intact,    Ears: Normal  Throat: Normal  Neck: Supple, Back: The patient's lumbar incision is well-healed. Lungs: Clear to auscultation bilaterally, respirations unlabored Heart: Regular rate and rhythm, no murmur, rub or gallop Abdomen: Soft, non-tender Extremities: Extremities normal, atraumatic, no cyanosis or edema Skin: unremarkable  NEUROLOGIC:   Mental status: alert and oriented,Motor Exam - grossly normal Sensory Exam - grossly normal Reflexes:  Coordination - grossly normal Gait - grossly normal Balance - grossly normal Cranial Nerves: I: smell Not tested  II: visual acuity  OS: Normal  OD: Normal   II: visual fields Full to confrontation  II: pupils Equal, round, reactive to light  III,VII: ptosis None  III,IV,VI: extraocular muscles  Full ROM  V: mastication Normal  V: facial light  touch sensation  Normal  V,VII: corneal reflex  Present  VII: facial muscle function - upper  Normal  VII: facial muscle function - lower Normal  VIII: hearing Not tested  IX: soft palate elevation  Normal  IX,X: gag reflex Present  XI: trapezius strength  5/5  XI: sternocleidomastoid strength 5/5  XI: neck flexion strength  5/5  XII: tongue strength  Normal    Data Review Lab Results  Component Value Date   WBC 8.2 07/03/2018   HGB 13.6 07/03/2018   HCT 41.9 07/03/2018   MCV 109.4 (H) 07/03/2018   PLT 271 07/03/2018   Lab Results  Component Value Date   NA 135 07/03/2018   K 4.0 07/03/2018   CL 103 07/03/2018   CO2 25 07/03/2018   BUN 19 07/03/2018   CREATININE 0.94 07/03/2018   GLUCOSE 100 (H) 07/03/2018   No results found for: INR, PROTIME  Assessment/Plan: L1-2 degenerative disc disease, stenosis, lumbago, lumbar radiculopathy: I have discussed the situation with the patient.  I have reviewed his imaging studies with him and pointed out the abnormalities.  We have discussed the various treatment options including extending his decompression and fusion up to T9.  I described that surgery to him.  I have shown him surgical models.  I have given him a surgical pamphlet.  We have discussed the risks, benefits, alternatives, expected postoperative course, and likelihood of achieving our goals with surgery.  I have answered all his questions.  He has decided to proceed with surgery.   Ophelia Charter 07/10/2018 10:23 AM

## 2018-07-10 NOTE — Anesthesia Procedure Notes (Signed)
Procedure Name: Intubation Date/Time: 07/10/2018 11:06 AM Performed by: Glynda Jaeger, CRNA Pre-anesthesia Checklist: Patient identified, Patient being monitored, Timeout performed, Emergency Drugs available and Suction available Patient Re-evaluated:Patient Re-evaluated prior to induction Oxygen Delivery Method: Circle System Utilized Preoxygenation: Pre-oxygenation with 100% oxygen Induction Type: IV induction Ventilation: Mask ventilation without difficulty Laryngoscope Size: Mac and 4 Grade View: Grade II Tube type: Oral Tube size: 7.5 mm Number of attempts: 1 Airway Equipment and Method: Stylet Placement Confirmation: ETT inserted through vocal cords under direct vision,  positive ETCO2 and breath sounds checked- equal and bilateral Secured at: 22 cm Tube secured with: Tape Dental Injury: Teeth and Oropharynx as per pre-operative assessment

## 2018-07-11 LAB — BASIC METABOLIC PANEL
ANION GAP: 9 (ref 5–15)
BUN: 11 mg/dL (ref 8–23)
CALCIUM: 8.5 mg/dL — AB (ref 8.9–10.3)
CO2: 29 mmol/L (ref 22–32)
Chloride: 100 mmol/L (ref 98–111)
Creatinine, Ser: 0.95 mg/dL (ref 0.61–1.24)
GFR calc Af Amer: >60 mL/min (ref >60)
GFR calc non Af Amer: >60 mL/min (ref >60)
Glucose, Bld: 148 mg/dL — ABNORMAL HIGH (ref 70–99)
Potassium: 4.9 mmol/L (ref 3.5–5.1)
Sodium: 138 mmol/L (ref 135–145)

## 2018-07-11 LAB — CBC
HEMATOCRIT: 33.5 % — AB (ref 39.0–52.0)
Hemoglobin: 10.6 g/dL — ABNORMAL LOW (ref 13.0–17.0)
MCH: 34.6 pg — AB (ref 26.0–34.0)
MCHC: 31.6 g/dL (ref 30.0–36.0)
MCV: 109.5 fL — ABNORMAL HIGH (ref 80.0–100.0)
Platelets: 239 10*3/uL (ref 150–400)
RBC: 3.06 MIL/uL — ABNORMAL LOW (ref 4.22–5.81)
RDW: 15.7 % — AB (ref 11.5–15.5)
WBC: 13.4 10*3/uL — ABNORMAL HIGH (ref 4.0–10.5)
nRBC: 0 % (ref 0.0–0.2)

## 2018-07-11 NOTE — Progress Notes (Signed)
Physical Therapy Treatment Patient Details Name: [name] MRN: [id number] DOB: 11-12-1940 Today's Date: 07/11/2018    History of Present Illness The patient is a 77 year old white male who Dr. Raliegh Scarlet performed an L2-3, L3-4 and L4-5 decompression, instrumentation and fusion.  He has developed recurrent back and leg pain. On 10/14 pt underwent L1-2 laminectomy and discectomy; exploration of lumbar fusion; posterior segmental instrumentation T9-L5 with globus titanium pedicle screws and rods; posterior lateral arthrodesis T9-10, T10-11, T11-12, T12-L1, L1-2, L2-3, L3-4 and L4-5 with local morselized autograft bone. PMH: COPD, depression, RA, prostate cancer. PSH: previous back surgerys, L THA, hernia repair.    PT Comments    Pt now with TLSO. Pt minA for transfers and ambulation however HR increased to 172 during ambulation. Pt HR jumping from 106-136bpm while in chair. SpO2 >95% on RA. Worked extensively with patient and family on donning/doffing the brace both in supine and sitting EOB. Pt with some SOB during mobility today. RN notifited of tachycardia. Acute PT to cont to follow.    Follow Up Recommendations  SNF(at retirement facility)     Equipment Recommendations  None recommended by PT    Recommendations for Other Services       Precautions / Restrictions Precautions Precautions: Back Precaution Booklet Issued: Yes (comment) Precaution Comments: pt with verbal understanding Required Braces or Orthoses: Spinal Brace Spinal Brace: Thoracolumbosacral orthotic Restrictions Weight Bearing Restrictions: No    Mobility  Bed Mobility Overal bed mobility: Needs Assistance Bed Mobility: Rolling;Sidelying to Sit Rolling: Supervision(used bed rail) Sidelying to sit: Min assist       General bed mobility comments: minA for trunk elevation  Transfers Overall transfer level: Needs assistance Equipment used: Rolling walker (2 wheeled) Transfers: Sit to/from Stand Sit  to Stand: Min assist         General transfer comment: verbal cues to push up from bed not pull up from walker, minA to power up  Ambulation/Gait Ambulation/Gait assistance: Min assist Gait Distance (Feet): 60 Feet Assistive device: Rolling walker (2 wheeled) Gait Pattern/deviations: Step-through pattern;Decreased stride length;Wide base of support Gait velocity: slow Gait velocity interpretation: <1.31 ft/sec, indicative of household ambulator General Gait Details: pt slow and guarded, HR up to 170s, SpO2 >95% on RA   Stairs             Wheelchair Mobility    Modified Rankin (Stroke Patients Only)       Balance Overall balance assessment: Needs assistance Sitting-balance support: Feet supported;Bilateral upper extremity supported Sitting balance-Leahy Scale: Fair     Standing balance support: Bilateral upper extremity supported Standing balance-Leahy Scale: Fair Standing balance comment: dependent on RW                            Cognition Arousal/Alertness: Awake/alert Behavior During Therapy: WFL for tasks assessed/performed Overall Cognitive Status: Within Functional Limits for tasks assessed                                        Exercises      General Comments General comments (skin integrity, edema, etc.): discussed at length with patient and family how to don/doff brace both in supine and sitting EOB. Spouse and daughter return demonstrated      Pertinent Vitals/Pain Pain Assessment: 0-10 Pain Score: 7  Pain Location: surgical site at back Pain Descriptors / Indicators: Discomfort  Pain Intervention(s): Monitored during session    Home Living                      Prior Function            PT Goals (current goals can now be found in the care plan section) Acute Rehab PT Goals Patient Stated Goal: get up Progress towards PT goals: Progressing toward goals    Frequency    Min 5X/week      PT  Plan Current plan remains appropriate    Co-evaluation              AM-PAC PT "6 Clicks" Daily Activity  Outcome Measure  Difficulty turning over in bed (including adjusting bedclothes, sheets and blankets)?: Unable Difficulty moving from lying on back to sitting on the side of the bed? : Unable Difficulty sitting down on and standing up from a chair with arms (e.g., wheelchair, bedside commode, etc,.)?: Unable Help needed moving to and from a bed to chair (including a wheelchair)?: A Little Help needed walking in hospital room?: A Little Help needed climbing 3-5 steps with a railing? : A Little 6 Click Score: 12    End of Session Equipment Utilized During Treatment: Gait belt;Back brace Activity Tolerance: (limited by HR into 170s') Patient left: in chair;with call bell/phone within reach;with family/visitor present Nurse Communication: Mobility status(and HR going from 106-140s sitting and 172 during ambulation) PT Visit Diagnosis: Unsteadiness on feet (R26.81);Pain;Difficulty in walking, not elsewhere classified (R26.2) Pain - part of body: (back)     Time: 1230-1300 PT Time Calculation (min) (ACUTE ONLY): 30 min  Charges:  $Gait Training: 8-22 mins $Therapeutic Activity: 8-22 mins                     [name], PT, DPT Acute Rehabilitation Services Pager #: [phone] Office #: [phone]    Berline Lopes 07/11/2018, 1:30 PM

## 2018-07-11 NOTE — Evaluation (Signed)
Physical Therapy Evaluation Patient Details Name: [name] MRN: [id number] DOB: Nov 07, 1940 Today's Date: 07/11/2018   History of Present Illness  The patient is a 77 year old white male who Dr. Raliegh Scarlet performed an L2-3, L3-4 and L4-5 decompression, instrumentation and fusion.  He has developed recurrent back and leg pain. On 10/14 pt underwent L1-2 laminectomy and discectomy; exploration of lumbar fusion; posterior segmental instrumentation T9-L5 with globus titanium pedicle screws and rods; posterior lateral arthrodesis T9-10, T10-11, T11-12, T12-L1, L1-2, L2-3, L3-4 and L4-5 with local morselized autograft bone. PMH: COPD, depression, RA, prostate cancer. PSH: previous back surgerys, L THA, hernia repair.  Clinical Impression  Pt admitted with above. PT began education and discussion about needing TLSO for OOB however per orders pt can go to bathroom without it. Dr. [name] came into room and asked to hold OOB mobility until TLSO was fitted later today. No OOB mobility completed this eval. Will return once TLSO has been fitted and provided for patient to progress OOB mobility.    Follow Up Recommendations CIR    Equipment Recommendations  None recommended by PT(TBD)    Recommendations for Other Services       Precautions / Restrictions Precautions Precautions: Back Precaution Booklet Issued: Yes (comment) Precaution Comments: pt with verbal understanding Required Braces or Orthoses: Spinal Brace Spinal Brace: Thoracolumbosacral orthotic(called and ordered from [employer]) Restrictions Weight Bearing Restrictions: No      Mobility  Bed Mobility               General bed mobility comments: Dr. [name] came into room and asked to hold mobility until TLSO arrived despite orders to go to bathroom without brace.  Transfers                    Ambulation/Gait                Stairs            Wheelchair Mobility    Modified Rankin (Stroke  Patients Only)       Balance                                             Pertinent Vitals/Pain Pain Assessment: 0-10 Pain Score: 7  Pain Location: surgical site at back Pain Descriptors / Indicators: Discomfort Pain Intervention(s): Monitored during session    Home Living Family/patient expects to be discharged to:: Skilled nursing facility Living Arrangements: Spouse/significant other               Additional Comments: lives at retirment community at [facility] but plans on going ot skilled side upon D/C    Prior Function Level of Independence: Independent with assistive device(s)         Comments: uses cane     Hand Dominance   Dominant Hand: Right    Extremity/Trunk Assessment   Upper Extremity Assessment Upper Extremity Assessment: Overall WFL for tasks assessed    Lower Extremity Assessment Lower Extremity Assessment: Overall WFL for tasks assessed    Cervical / Trunk Assessment Cervical / Trunk Assessment: Other exceptions Cervical / Trunk Exceptions: recent back surgery  Communication   Communication: No difficulties  Cognition Arousal/Alertness: Awake/alert Behavior During Therapy: WFL for tasks assessed/performed Overall Cognitive Status: Within Functional Limits for tasks assessed  General Comments      Exercises     Assessment/Plan    PT Assessment Patient needs continued PT services  PT Problem List Decreased strength;Decreased range of motion;Decreased activity tolerance;Decreased balance;Decreased mobility;Decreased coordination;Decreased knowledge of use of DME;Pain       PT Treatment Interventions DME instruction;Gait training;Functional mobility training;Therapeutic activities;Therapeutic exercise;Balance training    PT Goals (Current goals can be found in the Care Plan section)  Acute Rehab PT Goals Patient Stated Goal: get to rehab PT Goal  Formulation: With patient Time For Goal Achievement: 07/25/18 Potential to Achieve Goals: Good    Frequency Min 5X/week   Barriers to discharge        Co-evaluation               AM-PAC PT "6 Clicks" Daily Activity  Outcome Measure Difficulty turning over in bed (including adjusting bedclothes, sheets and blankets)?: Unable Difficulty moving from lying on back to sitting on the side of the bed? : Unable Difficulty sitting down on and standing up from a chair with arms (e.g., wheelchair, bedside commode, etc,.)?: Unable Help needed moving to and from a bed to chair (including a wheelchair)?: A Lot Help needed walking in hospital room?: A Lot Help needed climbing 3-5 steps with a railing? : A Lot 6 Click Score: 9    End of Session   Activity Tolerance: Other (comment)(awaiting brace) Patient left: in bed;with call bell/phone within reach;with family/visitor present Nurse Communication: Mobility status(need to call and order TLSO from [employer]) PT Visit Diagnosis: Unsteadiness on feet (R26.81);Pain;Difficulty in walking, not elsewhere classified (R26.2) Pain - part of body: (back)    Time: 6720-9198 PT Time Calculation (min) (ACUTE ONLY): 16 min   Charges:   PT Evaluation $PT Eval Moderate Complexity: 1 Mod          Kittie Plater, PT, DPT Acute Rehabilitation Services Pager #: [phone] Office #: [phone]   Berline Lopes 07/11/2018, 11:29 AM

## 2018-07-11 NOTE — NC FL2 (Signed)
[place] LEVEL OF CARE SCREENING TOOL     IDENTIFICATION  Patient Name: [name] Birthdate: Oct 28, 1940 Sex: male Admission Date (Current Location): 07/10/2018  [facility] [place] [facility] and [place] Number:  Herbalist and Address:  [facility] [place] [address] [place], [place] [address]      Provider Number: [id number]  Attending Physician Name and Address:  Newman Pies, MD  Relative Name and Phone Number:  [name]; wife; [phone]    Current Level of Care: Hospital Recommended Level of Care: [place] Prior Approval Number:    Date Approved/Denied:   PASRR Number: [id number]  Discharge Plan: SNF    Current Diagnoses: Patient Active Problem List   Diagnosis Date Noted  . Lumbar adjacent segment disease with spondylolisthesis 07/10/2018  . ILD (interstitial lung disease) ([place]) 07/04/2018  . Dyspnea 06/13/2018  . Cough due to ACE inhibitor 06/13/2018  . Former tobacco use 06/13/2018  . Acute sinusitis 05/30/2018  . Thrush 12/07/2017  . Tendinopathy of right gluteus medius 11/02/2017  . 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 Hardware complicating wound infection ([place])   . Vertebral osteophyte   . Vertebral osteomyelitis ([place])   . Pseudomonas aeruginosa infection   . Rheumatoid arthritis involving multiple sites with positive rheumatoid factor ([place])   . Wound drainage 05/02/2017  . 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/2017  . Surgery, elective   . Hypoxia   . Lumbar degenerative disc disease 02/17/2017  . Cigarette smoker 11/17/2016  . 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 Allergic rhinitis 03/23/2008  . HYPERLIPIDEMIA 03/15/2008  . 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 PNEUMONIA 03/14/2008  . GOLD COPD II B 03/14/2008  . Rheumatoid arthritis ([place]) 03/14/2008  . OSA on CPAP 03/14/2008    Orientation RESPIRATION BLADDER Height & Weight     Self,  Time, Situation, Place  Normal Continent Weight: 230 lb (104.3 kg) Height:  5' 7" (170.2 cm)  BEHAVIORAL SYMPTOMS/MOOD NEUROLOGICAL BOWEL NUTRITION STATUS      Continent Diet(see discharge summary)  AMBULATORY STATUS COMMUNICATION OF NEEDS Skin   Limited Assist Verbally Surgical wounds(incision on back with honeycomb dressing)                       Personal Care Assistance Level of Assistance  Feeding, Bathing, Dressing Bathing Assistance: Maximum assistance Feeding assistance: Independent Dressing Assistance: Limited assistance     Functional Limitations Info  Sight, Hearing, Speech Sight Info: Adequate Hearing Info: Adequate Speech Info: Adequate    SPECIAL CARE FACTORS FREQUENCY  PT (By licensed PT), OT (By licensed OT)     PT Frequency: 5x week OT Frequency: 5x week            Contractures Contractures Info: Not present    Additional Factors Info  Code Status, Allergies, Psychotropic Code Status Info: Full Code Allergies Info: SULFA ANTIBIOTICS, SULFASALAZINE, LISINOPRIL, VARENICLINE  Psychotropic Info: DULoxetine (CYMBALTA) DR capsule 60 mg every evening PO ;traZODone (DESYREL) tablet 50 mg daily at bedtime PO         Current Medications (07/11/2018):  This is the current hospital active medication list Current Facility-Administered Medications  Medication Dose Route Frequency Provider Last Rate Last Dose  . 0.9 %  sodium chloride infusion  250 mL Intravenous Continuous Newman Pies, MD 1 mL/hr at 07/10/18 2105 250 mL at 07/10/18 2105  . acetaminophen (TYLENOL) tablet 650 mg  650 mg Oral Q4H PRN Newman Pies, MD  Or  . acetaminophen (TYLENOL) suppository 650 mg  650 mg Rectal Q4H PRN Newman Pies, MD      . albuterol (PROVENTIL) (2.5 MG/3ML) 0.083% nebulizer solution 3 mL  3 mL Inhalation Q6H PRN Newman Pies, MD      . bisacodyl (DULCOLAX) suppository 10 mg  10 mg Rectal Daily PRN Newman Pies, MD      . ciprofloxacin (CIPRO)  tablet 750 mg  750 mg Oral BID Newman Pies, MD   750 mg at 07/11/18 4098  . cyclobenzaprine (FLEXERIL) tablet 10 mg  10 mg Oral TID PRN Newman Pies, MD   10 mg at 07/11/18 0630  . docusate sodium (COLACE) capsule 200 mg  200 mg Oral Daily Newman Pies, MD   200 mg at 07/11/18 1191  . DULoxetine (CYMBALTA) DR capsule 60 mg  60 mg Oral QPM Newman Pies, MD   60 mg at 07/10/18 2113  . famotidine (PEPCID) tablet 40 mg  40 mg Oral Daily Newman Pies, MD   40 mg at 07/11/18 1203  . finasteride (PROSCAR) tablet 5 mg  5 mg Oral QHS Newman Pies, MD   5 mg at 07/10/18 2106  . fluticasone (FLONASE) 50 MCG/ACT nasal spray 2 spray  2 spray Each Nare Daily PRN Newman Pies, MD      . fluticasone furoate-vilanterol (BREO ELLIPTA) 100-25 MCG/INH 1 puff  1 puff Inhalation Daily Newman Pies, MD   1 puff at 07/11/18 0950  . folic acid (FOLVITE) tablet 1 mg  1 mg Oral Daily Newman Pies, MD   1 mg at 07/11/18 0813  . gabapentin (NEURONTIN) capsule 600 mg  600 mg Oral TID Newman Pies, MD   600 mg at 07/11/18 4782  . hydrocortisone sodium succinate (SOLU-CORTEF) 100 MG injection 50 mg  50 mg Intravenous Q8H Newman Pies, MD   50 mg at 07/11/18 1203  . lactated ringers infusion   Intravenous Continuous Belinda Block, MD   Stopped at 07/10/18 1630  . losartan (COZAAR) tablet 50 mg  50 mg Oral Daily Newman Pies, MD      . menthol-cetylpyridinium (CEPACOL) lozenge 3 mg  1 lozenge Oral PRN Newman Pies, MD       Or  . phenol [facility]) mouth spray 1 spray  1 spray Mouth/Throat PRN Newman Pies, MD      . metoprolol succinate (TOPROL-XL) 24 hr tablet 25 mg  25 mg Oral QHS Newman Pies, MD   25 mg at 07/10/18 2107  . morphine 4 MG/ML injection 4 mg  4 mg Intravenous Q2H PRN Newman Pies, MD      . ondansetron [facility]) tablet 4 mg  4 mg Oral Q6H PRN Newman Pies, MD       Or  . ondansetron [facility]) injection 4 mg  4 mg Intravenous Q6H PRN Newman Pies, MD      . oxyCODONE (Oxy IR/ROXICODONE) immediate release tablet 10 mg  10 mg Oral Q3H PRN Newman Pies, MD   10 mg at 07/11/18 1006  . oxyCODONE (Oxy IR/ROXICODONE) immediate release tablet 5 mg  5 mg Oral Q3H PRN Newman Pies, MD   5 mg at 07/10/18 1749  . [START ON 07/13/2018] predniSONE (DELTASONE) tablet 5 mg  5 mg Oral Q breakfast Newman Pies, MD      . simvastatin (ZOCOR) tablet 40 mg  40 mg Oral QHS Newman Pies, MD   40 mg at 07/10/18 2107  . sodium chloride (OCEAN) 0.65 % nasal spray 1 spray  1  spray Each Nare PRN Newman Pies, MD      . sodium chloride flush (NS) 0.9 % injection 3 mL  3 mL Intravenous Q12H Newman Pies, MD   3 mL at 07/11/18 0813  . sodium chloride flush (NS) 0.9 % injection 3 mL  3 mL Intravenous PRN Newman Pies, MD      . tamsulosin [facility]) capsule 0.4 mg  0.4 mg Oral QHS Newman Pies, MD   0.4 mg at 07/10/18 2107  . traZODone (DESYREL) tablet 50 mg  50 mg Oral QHS Newman Pies, MD   50 mg at 07/10/18 2107  . umeclidinium bromide (INCRUSE ELLIPTA) 62.5 MCG/INH 1 puff  1 puff Inhalation Daily Newman Pies, MD   1 puff at 07/11/18 6788     Discharge Medications: Please see discharge summary for a list of discharge medications.  Relevant Imaging Results:  Relevant Lab Results:   Additional Information [id number] [place], [place]

## 2018-07-11 NOTE — Progress Notes (Signed)
Orthopedic Tech Progress Note Patient Details:  SABA NEUMAN 1941/09/24 [id number]  Patient ID: [name], male   DOB: Sep 25, 1941, 77 y.o.   MRN: [id number]   [name] 07/11/2018, 8:31 AMCalled [employer] for [employer] TLSO brace.

## 2018-07-11 NOTE — Progress Notes (Signed)
Pt tachycardia with PT in 170's with a few PACs. HR currently 107. Dr. [name] notified and orders received. Will continue to monitor pt closely. Leanne Chang, RN

## 2018-07-11 NOTE — Social Work (Addendum)
CSW received a call, pt is from [employer]. Aware CIR is recommended level of care but await further therapy assessment. Pt would be able to go to SNF side of [place] but will need authorization prior to discharge.   2:50pm- CSW sent needed information to [facility] to initiate authorization.  Westley Hummer, MSW, [place] Work [phone]

## 2018-07-11 NOTE — Progress Notes (Signed)
Subjective: The patient is alert and pleasant.  His wife is at the bedside.  He looks well.  Objective: Vital signs in last 24 hours: Temp:  [97.6 F (36.4 C)-99.2 F (37.3 C)] 98.4 F (36.9 C) (10/15 0321) Pulse Rate:  [89-125] 91 (10/15 0321) Resp:  [12-40] 25 (10/15 0321) BP: (117-141)/(63-87) 131/63 (10/15 0321) SpO2:  [89 %-100 %] 95 % (10/15 0321) Weight:  [104.3 kg] 104.3 kg (10/14 0949) Estimated body mass index is 36.02 kg/m as calculated from the following:   Height as of this encounter: _0  (1.702 m).   Weight as of this encounter: 104.3 kg.   Intake/Output from previous day: 10/14 0701 - 10/15 0700 In: 3000 [P.O.:200; I.V.:2100; IV Piggyback:700] Out: 2115 [Urine:1715; Blood:400] Intake/Output this shift: No intake/output data recorded.  Physical exam the patient is alert and oriented.  He is moving his lower extremities well.  Lab Results: Recent Labs    07/11/18 0335  WBC 13.4*  HGB 10.6*  HCT 33.5*  PLT 239   BMET Recent Labs    07/11/18 0335  NA 138  K 4.9  CL 100  CO2 29  GLUCOSE 148*  BUN 11  CREATININE 0.95  CALCIUM 8.5*    Studies/Results: Dg Lumbar Spine 2-3 Views  Result Date: 07/10/2018 CLINICAL DATA:  T9-L5 fusion EXAM: LUMBAR SPINE - 2-3 VIEW; DG C-ARM 61-120 MIN COMPARISON:  Chest CT-06/29/2018 FINDINGS: Four spot intraoperative fluoroscopic images of the lower thoracic and lumbar spine are provided for review. Spinal labeling is difficult given the coned field of view and non definitive visualization of the lumbosacral junction. Provided images demonstrate sequela paraspinal fusion at least 2 level intervertebral disc space replacement. Provided images demonstrate anatomic alignment. Additional surgical support apparatus is seen posterior to the operative site. No definite radiopaque foreign body. IMPRESSION: Post long segment thoracolumbar paraspinal fusion and intervertebral disc space replacement. Electronically Signed   By: Sandi Mariscal M.D.   On: 07/10/2018 16:27   Dg Lumbar Spine 1 View  Result Date: 07/10/2018 CLINICAL DATA:  Elective surgery.  Localization of L1-2 PLIF. EXAM: LUMBAR SPINE - 1 VIEW COMPARISON:  MRI of the lumbar spine 05/18/2018 FINDINGS: Intraoperative imaging of lumbar spine demonstrates a surgical probe at the L1-2 disc level. IMPRESSION: Intraoperative localization of L1-2. Electronically Signed   By: San Morelle M.D.   On: 07/10/2018 16:48   Dg Chest Port 1 View  Result Date: 07/10/2018 CLINICAL DATA:  Shortness of breath postoperatively. EXAM: PORTABLE CHEST 1 VIEW COMPARISON:  05/30/2018 FINDINGS: Interval spinal fusion. Worsened atelectasis in the left lower lobe with a small left effusion. Mild chronic scarring at the right lung base. Upper lungs are clear. IMPRESSION: Worsened atelectasis at the left base with a small left effusion. Electronically Signed   By: Nelson Chimes M.D.   On: 07/10/2018 19:34   Dg C-arm 1-60 Min  Result Date: 07/10/2018 CLINICAL DATA:  T9-L5 fusion EXAM: LUMBAR SPINE - 2-3 VIEW; DG C-ARM 61-120 MIN COMPARISON:  Chest CT-06/29/2018 FINDINGS: Four spot intraoperative fluoroscopic images of the lower thoracic and lumbar spine are provided for review. Spinal labeling is difficult given the coned field of view and non definitive visualization of the lumbosacral junction. Provided images demonstrate sequela paraspinal fusion at least 2 level intervertebral disc space replacement. Provided images demonstrate anatomic alignment. Additional surgical support apparatus is seen posterior to the operative site. No definite radiopaque foreign body. IMPRESSION: Post long segment thoracolumbar paraspinal fusion and intervertebral disc space replacement. Electronically Signed  By: Sandi Mariscal M.D.   On: 07/10/2018 16:27    Assessment/Plan: Postop day #1: The patient is doing well.  We are awaiting a TLSO in order to be able to mobilize him.  He will likely go to rehab in a  few days.  LOS: 1 day     Ophelia Charter 07/11/2018, 8:07 AM

## 2018-07-11 NOTE — Clinical Social Work Note (Signed)
Clinical Social Work Assessment  Patient Details  Name: [name] MRN: [id number] Date of Birth: 1941-03-09  Date of referral:  07/11/18               Reason for consult:  Discharge Planning                Permission sought to share information with:  Facility Sport and exercise psychologist, Family Supports Permission granted to share information::  Yes, Verbal Permission Granted  Name::     [place]::  [place]  Relationship::  wife  Contact Information:  [phone]  Housing/Transportation Living arrangements for the past 2 months:  [place] of Information:  Facility, Patient, Spouse Patient Interpreter Needed:  None Criminal Activity/Legal Involvement Pertinent to Current Situation/Hospitalization:  No - Comment as needed Significant Relationships:  Spouse Lives with:  Spouse, Facility Resident Do you feel safe going back to the place where you live?  Yes Need for family participation in patient care:  Yes (Comment)  Care giving concerns: Pt lives at [place] at [facility]. Requiring assistance for ADLs and IADLs, recommended for SNF level support and supervision at discharge.   Social Worker assessment / plan:  CSW met with pt and pt wife at bedsid, introduced self and role, as well as reason for visit. Confirmed that pt is a resident at [facility]. Pt and pt wife have lived there about two years and have two children that live nearby as well. Pt and pt wife both interested in SNF level at [facility]. CSW explained referral process and that pt would need insurance authorization prior to discharging to SNF. Both pt and pt wife state understanding, they stated no further questions and await confirmation of insurance authorization for SNF.   Employment status:  Retired Nurse, adult PT Recommendations:  [address], [place] / Referral to community resources:  [place]  Patient/Family's Response to care:  Pt and pt wife amenable to meeting with CSW and understand SNF recommendations. Are in agreement and await insurance authorization.   Patient/Family's Understanding of and Emotional Response to Diagnosis, Current Treatment, and Prognosis:  Pt and pt wife state understanding of diagnosis, current treatment and prognosis. Pt and pt wife amenable to SNF and returning to [facility]. They were both emotionally appropriate throughout assessment and appear to be happy with care here at hospital.  Emotional Assessment Appearance:  Appears stated age Attitude/Demeanor/Rapport:  Engaged Affect (typically observed):  Accepting, Adaptable, Appropriate Orientation:  Oriented to Self, Oriented to Place, Oriented to  Time, Oriented to Situation Alcohol / Substance use:  Not Applicable Psych involvement (Current and /or in the community):  No (Comment)  Discharge Needs  Concerns to be addressed:  Care Coordination Readmission within the last 30 days:  No Current discharge risk:  Physical Impairment Barriers to Discharge:  Ship broker, Continued Medical Work up   [employer], [place] 07/11/2018, 4:16 PM

## 2018-07-12 MED ORDER — CEPHALEXIN 500 MG PO CAPS
1000.0000 mg | ORAL_CAPSULE | Freq: Four times a day (QID) | ORAL | Status: AC
  Administered 2018-07-12 – 2018-07-14 (×6): 1000 mg via ORAL
  Filled 2018-07-12 (×6): qty 2

## 2018-07-12 NOTE — Social Work (Signed)
CSW continues to await insurance approval for pt to discharge to SNF at [facility].  Westley Hummer, MSW, [place] Work [phone]

## 2018-07-12 NOTE — Progress Notes (Signed)
Subjective: The patient is alert and pleasant.  He is sitting at the bedside with his TLSO on.  His wife is at the bedside.  Objective: Vital signs in last 24 hours: Temp:  [98.1 F (36.7 C)-99.2 F (37.3 C)] 98.4 F (36.9 C) (10/16 0810) Pulse Rate:  [77-104] 77 (10/16 0810) Resp:  [18-28] 27 (10/16 0810) BP: (90-142)/(63-76) 119/67 (10/16 0810) SpO2:  [94 %-100 %] 100 % (10/16 0938) Estimated body mass index is 36.02 kg/m as calculated from the following:   Height as of this encounter: _0  (1.702 m).   Weight as of this encounter: 104.3 kg.   Intake/Output from previous day: 10/15 0701 - 10/16 0700 In: 600 [P.O.:600] Out: 1200 [Urine:1200] Intake/Output this shift: No intake/output data recorded.  Physical exam the patient is alert and oriented.  His strength is grossly normal in his lower extremities.  By report his wound has had some bloody drainage.  Lab Results: Recent Labs    07/11/18 0335  WBC 13.4*  HGB 10.6*  HCT 33.5*  PLT 239   BMET Recent Labs    07/11/18 0335  NA 138  K 4.9  CL 100  CO2 29  GLUCOSE 148*  BUN 11  CREATININE 0.95  CALCIUM 8.5*    Studies/Results: Dg Lumbar Spine 2-3 Views  Result Date: 07/10/2018 CLINICAL DATA:  T9-L5 fusion EXAM: LUMBAR SPINE - 2-3 VIEW; DG C-ARM 61-120 MIN COMPARISON:  Chest CT-06/29/2018 FINDINGS: Four spot intraoperative fluoroscopic images of the lower thoracic and lumbar spine are provided for review. Spinal labeling is difficult given the coned field of view and non definitive visualization of the lumbosacral junction. Provided images demonstrate sequela paraspinal fusion at least 2 level intervertebral disc space replacement. Provided images demonstrate anatomic alignment. Additional surgical support apparatus is seen posterior to the operative site. No definite radiopaque foreign body. IMPRESSION: Post long segment thoracolumbar paraspinal fusion and intervertebral disc space replacement. Electronically  Signed   By: Sandi Mariscal M.D.   On: 07/10/2018 16:27   Dg Lumbar Spine 1 View  Result Date: 07/10/2018 CLINICAL DATA:  Elective surgery.  Localization of L1-2 PLIF. EXAM: LUMBAR SPINE - 1 VIEW COMPARISON:  MRI of the lumbar spine 05/18/2018 FINDINGS: Intraoperative imaging of lumbar spine demonstrates a surgical probe at the L1-2 disc level. IMPRESSION: Intraoperative localization of L1-2. Electronically Signed   By: San Morelle M.D.   On: 07/10/2018 16:48   Dg Chest Port 1 View  Result Date: 07/10/2018 CLINICAL DATA:  Shortness of breath postoperatively. EXAM: PORTABLE CHEST 1 VIEW COMPARISON:  05/30/2018 FINDINGS: Interval spinal fusion. Worsened atelectasis in the left lower lobe with a small left effusion. Mild chronic scarring at the right lung base. Upper lungs are clear. IMPRESSION: Worsened atelectasis at the left base with a small left effusion. Electronically Signed   By: Nelson Chimes M.D.   On: 07/10/2018 19:34   Dg C-arm 1-60 Min  Result Date: 07/10/2018 CLINICAL DATA:  T9-L5 fusion EXAM: LUMBAR SPINE - 2-3 VIEW; DG C-ARM 61-120 MIN COMPARISON:  Chest CT-06/29/2018 FINDINGS: Four spot intraoperative fluoroscopic images of the lower thoracic and lumbar spine are provided for review. Spinal labeling is difficult given the coned field of view and non definitive visualization of the lumbosacral junction. Provided images demonstrate sequela paraspinal fusion at least 2 level intervertebral disc space replacement. Provided images demonstrate anatomic alignment. Additional surgical support apparatus is seen posterior to the operative site. No definite radiopaque foreign body. IMPRESSION: Post long segment thoracolumbar paraspinal fusion  and intervertebral disc space replacement. Electronically Signed   By: Sandi Mariscal M.D.   On: 07/10/2018 16:27    Assessment/Plan: Postop day #2: The patient is doing well.  I will start empiric Keflex because of his wound drainage to decrease the  chance of infection.  He will likely transfer to the rehab facility tomorrow or Friday.  I have answered all their questions.  LOS: 2 days     Ophelia Charter 07/12/2018, 10:14 AM

## 2018-07-12 NOTE — Progress Notes (Signed)
Physical Therapy Treatment Patient Details Name: [name] MRN: [id number] DOB: 02-Jan-1941 Today's Date: 07/12/2018    History of Present Illness The patient is a 77 year old white male who Dr. Raliegh Scarlet performed an L2-3, L3-4 and L4-5 decompression, instrumentation and fusion.  He has developed recurrent back and leg pain. On 10/14 pt underwent L1-2 laminectomy and discectomy; exploration of lumbar fusion; posterior segmental instrumentation T9-L5 with globus titanium pedicle screws and rods; posterior lateral arthrodesis T9-10, T10-11, T11-12, T12-L1, L1-2, L2-3, L3-4 and L4-5 with local morselized autograft bone. PMH: COPD, depression, RA, prostate cancer. PSH: previous back surgerys, L THA, hernia repair.    PT Comments    Pt with improved ambulation tolerance this date and more controlled HR. HR inc to 130s with amb with noted SOB due to difficulty breathing with TLSO. Pt with good understanding of back precautions. Acute PT to cont to follow.    Follow Up Recommendations  SNF     Equipment Recommendations  None recommended by PT    Recommendations for Other Services       Precautions / Restrictions Precautions Precautions: Back Precaution Booklet Issued: Yes (comment) Precaution Comments: pt able to recall 3/3 back precautions Required Braces or Orthoses: Spinal Brace Spinal Brace: Thoracolumbosacral orthotic;Applied in supine position Restrictions Weight Bearing Restrictions: No    Mobility  Bed Mobility Overal bed mobility: Needs Assistance Bed Mobility: Rolling;Sidelying to Sit Rolling: Min assist Sidelying to sit: Min assist       General bed mobility comments: pt with increased back pain requiring minA for rolling and trunk elevation to sit EOB, pt rolled x2 to don TLSO in supine  Transfers Overall transfer level: Needs assistance Equipment used: Rolling walker (2 wheeled) Transfers: Sit to/from Stand Sit to Stand: Min assist         General  transfer comment: v/c's to push up from bed, minA to steady during transition of hands due to instability with knees  Ambulation/Gait Ambulation/Gait assistance: Min assist Gait Distance (Feet): 150 Feet Assistive device: Rolling walker (2 wheeled) Gait Pattern/deviations: Step-through pattern;Decreased stride length Gait velocity: slow Gait velocity interpretation: <1.31 ft/sec, indicative of household ambulator General Gait Details: pt HR increased to 130s during amb and SPO2 into 80s on RA, pt given 2LO2 via [place], pt in 90s SPO2. Pt with 2 standing rest breaks, bilat knee instability with onset of fatigue, noted SOB, pt states its hard to breath with this brace on   Stairs             Wheelchair Mobility    Modified Rankin (Stroke Patients Only)       Balance Overall balance assessment: Needs assistance Sitting-balance support: Feet supported;Bilateral upper extremity supported Sitting balance-Leahy Scale: Fair     Standing balance support: Bilateral upper extremity supported Standing balance-Leahy Scale: Fair Standing balance comment: dependent on RW                            Cognition Arousal/Alertness: Awake/alert Behavior During Therapy: WFL for tasks assessed/performed Overall Cognitive Status: Within Functional Limits for tasks assessed                                        Exercises      General Comments General comments (skin integrity, edema, etc.): pt dependent for donning of brace      Pertinent Vitals/Pain Pain  Assessment: 0-10 Pain Score: 7  Pain Location: surgical site at back Pain Descriptors / Indicators: Discomfort Pain Intervention(s): Monitored during session    Home Living                      Prior Function            PT Goals (current goals can now be found in the care plan section) Acute Rehab PT Goals Patient Stated Goal: get up Progress towards PT goals: Progressing toward goals     Frequency    Min 5X/week      PT Plan Current plan remains appropriate    Co-evaluation              AM-PAC PT "6 Clicks" Daily Activity  Outcome Measure  Difficulty turning over in bed (including adjusting bedclothes, sheets and blankets)?: Unable Difficulty moving from lying on back to sitting on the side of the bed? : Unable Difficulty sitting down on and standing up from a chair with arms (e.g., wheelchair, bedside commode, etc,.)?: Unable Help needed moving to and from a bed to chair (including a wheelchair)?: A Little Help needed walking in hospital room?: A Little Help needed climbing 3-5 steps with a railing? : A Little 6 Click Score: 12    End of Session Equipment Utilized During Treatment: Gait belt;Back brace Activity Tolerance: Patient tolerated treatment well Patient left: in chair;with call bell/phone within reach;with family/visitor present Nurse Communication: Mobility status PT Visit Diagnosis: Unsteadiness on feet (R26.81);Pain;Difficulty in walking, not elsewhere classified (R26.2)     Time: 9563-8756 PT Time Calculation (min) (ACUTE ONLY): 32 min  Charges:  $Gait Training: 8-22 mins $Therapeutic Activity: 8-22 mins                     Kittie Plater, PT, DPT Acute Rehabilitation Services Pager #: [phone] Office #: [phone]    Berline Lopes 07/12/2018, 9:14 AM

## 2018-07-12 NOTE — Evaluation (Signed)
Occupational Therapy Evaluation Patient Details Name: [name] MRN: [id number] DOB: [birth year] Today's Date: 07/12/2018    History of Present Illness The patient is a 77 year old white male who Dr. Raliegh Scarlet performed an L2-3, L3-4 and L4-5 decompression, instrumentation and fusion.  He has developed recurrent back and leg pain. On 10/14 pt underwent L1-2 laminectomy and discectomy; exploration of lumbar fusion; posterior segmental instrumentation T9-L5 with globus titanium pedicle screws and rods; posterior lateral arthrodesis T9-10, T10-11, T11-12, T12-L1, L1-2, L2-3, L3-4 and L4-5 with local morselized autograft bone. PMH: COPD, depression, RA, prostate cancer. PSH: previous back surgerys, L THA, hernia repair.   Clinical Impression   PTA, pt was living with his wife at [facility] and was independent with BADLs and using a cane for functional mobility. Pt currently requiring Max A for brace management, Min Guard A for LB ADLs with AE, and Min Guard A for functional mobility with RW. Providing education on back precautions, brace management, bed mobility, LB ADLs, and toileting. Pt motivated to participate in therapy and return to independence. During activity, pt HR jumping from 140s-190s; requiring pt to take rest breaks and HR decreasing to 140s. At rest, pt HR 110s. Pt would benefit from further acute OT to facilitate safe dc. Recommend dc to SNF for further OT to optimize safety, independence with ADLs, and return to PLOF.      Follow Up Recommendations  SNF;Supervision/Assistance - 24 hour    Equipment Recommendations  Other (comment)(Defer to next venue)    Recommendations for Other Services PT consult     Precautions / Restrictions Precautions Precautions: Back Precaution Booklet Issued: Yes (comment) Precaution Comments: pt able to recall 3/3 back precautions Required Braces or Orthoses: Spinal Brace Spinal Brace: Thoracolumbosacral orthotic;Applied in supine  position Restrictions Weight Bearing Restrictions: No      Mobility Bed Mobility Overal bed mobility: Needs Assistance Bed Mobility: Rolling;Sidelying to Sit Rolling: Min assist Sidelying to sit: Min guard       General bed mobility comments: Min A to roll onto left side. Min Guard A for safety while pushing up into sitting  Transfers Overall transfer level: Needs assistance Equipment used: Rolling walker (2 wheeled) Transfers: Sit to/from Stand Sit to Stand: Min guard         General transfer comment: Min Guard A for safety. demonstrating good hand placement    Balance Overall balance assessment: Needs assistance Sitting-balance support: Feet supported;Bilateral upper extremity supported Sitting balance-Leahy Scale: Fair     Standing balance support: Bilateral upper extremity supported Standing balance-Leahy Scale: Fair Standing balance comment: dependent on RW                           ADL either performed or assessed with clinical judgement   ADL Overall ADL's : Needs assistance/impaired Eating/Feeding: Independent;Sitting   Grooming: Set up;Supervision/safety;Standing   Upper Body Bathing: Minimal assistance;Sitting   Lower Body Bathing: Min guard;Sit to/from stand;With adaptive equipment   Upper Body Dressing : Maximal assistance;With caregiver independent assisting;Sitting Upper Body Dressing Details (indicate cue type and reason): Pt's wife donning brace with Mod A to adjust straps and achieve correct position.  Lower Body Dressing: Min guard;Sit to/from stand;With adaptive equipment Lower Body Dressing Details (indicate cue type and reason): Providing educaiton on LB dressing with AE. Pt donning underwear with AE demonstrating understanding of compensatory techniques.  Toilet Transfer: Min guard;Ambulation;RW Toilet Transfer Details (indicate cue type and reason): Min Guard A for safety  Functional mobility during ADLs: Min  guard;Rolling walker General ADL Comments: HR elevated throughout mobility. RN present and stating he is alright if not symptomatic. Providing education on LB dressing with AE, toileting, brace management, and bed mobility.      Vision Baseline Vision/History: Wears glasses Wears Glasses: At all times Patient Visual Report: No change from baseline       Perception     Praxis      Pertinent Vitals/Pain Pain Assessment: Faces Faces Pain Scale: Hurts little more Pain Location: surgical site at back Pain Descriptors / Indicators: Discomfort Pain Intervention(s): Monitored during session;Limited activity within patient's tolerance;Repositioned     Hand Dominance Right   Extremity/Trunk Assessment Upper Extremity Assessment Upper Extremity Assessment: Overall WFL for tasks assessed   Lower Extremity Assessment Lower Extremity Assessment: Overall WFL for tasks assessed   Cervical / Trunk Assessment Cervical / Trunk Assessment: Other exceptions Cervical / Trunk Exceptions: recent back surgery   Communication Communication Communication: No difficulties   Cognition Arousal/Alertness: Awake/alert Behavior During Therapy: WFL for tasks assessed/performed Overall Cognitive Status: Within Functional Limits for tasks assessed                                     General Comments  Pt with HR elevating to 190s. RN present and aware. not symptomatic. SItting back at EOB after toileting.     Exercises     Shoulder Instructions      Home Living Family/patient expects to be discharged to:: Skilled nursing facility Living Arrangements: Spouse/significant other                               Additional Comments: Pt lives at retirment community at [facility] but plans on going to rehab upon D/C      Prior Functioning/Environment Level of Independence: Independent with assistive device(s)        Comments: uses cane        OT Problem List:  Decreased activity tolerance;Decreased range of motion;Impaired balance (sitting and/or standing);Decreased knowledge of use of DME or AE;Decreased knowledge of precautions;Cardiopulmonary status limiting activity;Pain      OT Treatment/Interventions: Self-care/ADL training;Therapeutic exercise;Energy conservation;DME and/or AE instruction;Therapeutic activities;Patient/family education    OT Goals(Current goals can be found in the care plan section) Acute Rehab OT Goals Patient Stated Goal: "Go to rehab" OT Goal Formulation: With patient/family Time For Goal Achievement: 07/26/18 Potential to Achieve Goals: Good  OT Frequency: Min 2X/week   Barriers to D/C:            Co-evaluation              AM-PAC PT "6 Clicks" Daily Activity     Outcome Measure Help from another person eating meals?: None Help from another person taking care of personal grooming?: A Little Help from another person toileting, which includes using toliet, bedpan, or urinal?: A Little Help from another person bathing (including washing, rinsing, drying)?: A Little Help from another person to put on and taking off regular upper body clothing?: A Lot Help from another person to put on and taking off regular lower body clothing?: A Little 6 Click Score: 18   End of Session Equipment Utilized During Treatment: Rolling walker;Back brace Nurse Communication: Mobility status;Other (comment)(HR)  Activity Tolerance: Patient tolerated treatment well Patient left: in bed;with call bell/phone within reach;with family/visitor present;with SCD's  reapplied  OT Visit Diagnosis: Unsteadiness on feet (R26.81);Other abnormalities of gait and mobility (R26.89);Muscle weakness (generalized) (M62.81);Pain Pain - part of body: (Back)                Time: 2505-3976 OT Time Calculation (min): 44 min Charges:  OT General Charges $OT Visit: 1 Visit OT Evaluation $OT Eval Moderate Complexity: 1 Mod OT Treatments $Self  Care/Home Management : 23-37 mins  [place], OTR/L Acute Rehab Pager: [phone] Office: [place] 07/12/2018, 3:54 PM

## 2018-07-12 NOTE — Progress Notes (Signed)
I forgot to mention that his subcutaneous fluid cultures taken at surgery are negative so far.

## 2018-07-12 NOTE — Social Work (Signed)
Additional clinicals sent to [facility] at [place], continue to await approval for SNF at [place] through [facility].  Westley Hummer, MSW, [place] Work [phone]

## 2018-07-13 ENCOUNTER — Inpatient Hospital Stay (HOSPITAL_COMMUNITY): Payer: Medicare Other

## 2018-07-13 DIAGNOSIS — I480 Paroxysmal atrial fibrillation: Secondary | ICD-10-CM

## 2018-07-13 LAB — CBC
HCT: 30.7 % — ABNORMAL LOW (ref 39.0–52.0)
Hemoglobin: 9.8 g/dL — ABNORMAL LOW (ref 13.0–17.0)
MCH: 35.3 pg — AB (ref 26.0–34.0)
MCHC: 31.9 g/dL (ref 30.0–36.0)
MCV: 110.4 fL — AB (ref 80.0–100.0)
PLATELETS: 244 10*3/uL (ref 150–400)
RBC: 2.78 MIL/uL — AB (ref 4.22–5.81)
RDW: 15.2 % (ref 11.5–15.5)
WBC: 14.7 10*3/uL — ABNORMAL HIGH (ref 4.0–10.5)
nRBC: 0.4 % — ABNORMAL HIGH (ref 0.0–0.2)

## 2018-07-13 LAB — BASIC METABOLIC PANEL
Anion gap: 8 (ref 5–15)
BUN: 15 mg/dL (ref 8–23)
CO2: 26 mmol/L (ref 22–32)
CREATININE: 0.84 mg/dL (ref 0.61–1.24)
Calcium: 8 mg/dL — ABNORMAL LOW (ref 8.9–10.3)
Chloride: 100 mmol/L (ref 98–111)
GFR calc Af Amer: >60 mL/min (ref >60)
GLUCOSE: 120 mg/dL — AB (ref 70–99)
POTASSIUM: 3.7 mmol/L (ref 3.5–5.1)
Sodium: 134 mmol/L — ABNORMAL LOW (ref 135–145)

## 2018-07-13 LAB — TROPONIN I: Troponin I: <0.03 ng/mL (ref <0.03)

## 2018-07-13 LAB — BRAIN NATRIURETIC PEPTIDE: B Natriuretic Peptide: 120 pg/mL — ABNORMAL HIGH (ref 0.0–100.0)

## 2018-07-13 MED ORDER — METOPROLOL SUCCINATE ER 50 MG PO TB24
50.0000 mg | ORAL_TABLET | Freq: Every day | ORAL | Status: DC
  Administered 2018-07-13 – 2018-07-14 (×2): 50 mg via ORAL
  Filled 2018-07-13 (×2): qty 1

## 2018-07-13 MED ORDER — DILTIAZEM HCL 60 MG PO TABS
30.0000 mg | ORAL_TABLET | Freq: Four times a day (QID) | ORAL | Status: DC
  Filled 2018-07-13: qty 1

## 2018-07-13 MED FILL — Heparin Sodium (Porcine) Inj 1000 Unit/ML: INTRAMUSCULAR | Qty: 30 | Status: AC

## 2018-07-13 MED FILL — Sodium Chloride IV Soln 0.9%: INTRAVENOUS | Qty: 1000 | Status: AC

## 2018-07-13 NOTE — Consult Note (Signed)
Cardiology Consultation:   [name] MRN: [id number]; DOB: 12-05-1940  Admit date: 07/10/2018 Date of Consult: 07/13/2018  Primary Care Provider: Javier Glazier, MD Primary Cardiologist: Dr Stanford Breed   Patient Profile:   [name] is a 77 y.o. male with a hx of rheumatoid arthritis, prostate cancer, hypertension, hyperlipidemia, gastroesophageal reflux disease, COPD and status post back surgery who is being seen today for the evaluation of atrial fibrillation at the request of Kipp Brood MD.  History of Present Illness:   Patient underwent back surgery on October 14.  He complained of increasing dyspnea today and was felt to have atrial fibrillation.  Cardiology now asked to evaluate.  Patient has chronic dyspnea on exertion that he attributes to deconditioning no orthopnea or PND.  Occasional pedal edema.  He does not have chest pain.  He does state that he has intermittent palpitations at home predominantly at night.  Patient stated that today he has felt slightly more dyspneic with ambulation.  He has not had significant palpitations or chest pain.  He was felt to have potential atrial fibrillation on telemetry and cardiology asked to evaluate.  Past Medical History:  Diagnosis Date  . Chronic lower back pain   . Complication of anesthesia 01/2017   was in ICU due to breathing complications-  . COPD (chronic obstructive pulmonary disease) ([place])   . Degenerative scoliosis in adult patient   . Depression   . Dyspnea    on exertion  . GERD (gastroesophageal reflux disease)   . H/O seasonal allergies   . High cholesterol   . 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 History of kidney stones   . Hypertension   . 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 OSA on CPAP    wears CPAP  . Pneumonia    "I've had it several times; last time was end of Jan 2018" (05/02/2017)  . Pre-diabetes   .  Prostate cancer ([place])   . Rheumatoid arteritis ([place])    "all over" (05/02/2017)  . Thrush 12/07/2017  . Wears glasses     Past Surgical History:  Procedure Laterality Date  . BACK SURGERY    . COLONOSCOPY W/ POLYPECTOMY    . FRACTURE SURGERY    . HERNIA REPAIR    . HIP ARTHROPLASTY Left 04/2016  . INGUINAL HERNIA REPAIR Left 1986  . JOINT REPLACEMENT    . LUMBAR WOUND DEBRIDEMENT N/A 04/18/2017   Procedure: REPAIR OF LUMBAR PSEUDOMENINGOCELE;  Surgeon: Newman Pies, MD;  Location: [facility];  Service: Neurosurgery;  Laterality: N/A;  REAIR OF LUMBAR PSEUDOMENINGOCELE  . LUMBAR WOUND DEBRIDEMENT N/A 05/05/2017   Procedure: I&D Lumbar Wound;  Surgeon: Newman Pies, MD;  Location: [facility];  Service: Neurosurgery;  Laterality: N/A;  . MAXIMUM ACCESS (MAS)POSTERIOR LUMBAR INTERBODY FUSION (PLIF) 3 LEVEL  02/17/2017   [name] 02/17/2017  . OPEN SURGICAL REPAIR OF GLUTEAL TENDON Right 11/02/2017   Procedure: Right hip gluteal tendon repair;  Surgeon: Gaynelle Arabian, MD;  Location: [facility];  Service: Orthopedics;  Laterality: Right;  . POSTERIOR LAMINECTOMY / DECOMPRESSION LUMBAR SPINE  2010  . PROSTATE BIOPSY    . PROSTATE BIOPSY  <2013 X 3  . SPLENECTOMY  1955  . TONSILLECTOMY    . TOTAL HIP ARTHROPLASTY Right 10/2015  . UMBILICAL HERNIA REPAIR  05/2016       Inpatient Medications: Scheduled Meds: . cephALEXin  1,000 mg Oral Q6H  .  ciprofloxacin  750 mg Oral BID  . diltiazem  30 mg Oral Q6H  . docusate sodium  200 mg Oral Daily  . DULoxetine  60 mg Oral QPM  . famotidine  40 mg Oral Daily  . finasteride  5 mg Oral QHS  . fluticasone furoate-vilanterol  1 puff Inhalation Daily  . folic acid  1 mg Oral Daily  . gabapentin  600 mg Oral TID  . losartan  50 mg Oral Daily  . metoprolol succinate  25 mg Oral QHS  . predniSONE  5 mg Oral Q breakfast  . simvastatin  40 mg Oral QHS  . sodium chloride flush  3 mL Intravenous Q12H  . tamsulosin  0.4 mg Oral QHS  . traZODone  50 mg Oral QHS    . umeclidinium bromide  1 puff Inhalation Daily   Continuous Infusions: . sodium chloride Stopped (07/11/18 1541)  . lactated ringers Stopped (07/10/18 1630)   PRN Meds: acetaminophen **OR** acetaminophen, albuterol, bisacodyl, cyclobenzaprine, fluticasone, menthol-cetylpyridinium **OR** phenol, morphine injection, ondansetron **OR** ondansetron (ZOFRAN) IV, oxyCODONE, oxyCODONE, sodium chloride, sodium chloride flush  Allergies:    Allergies  Allergen Reactions  . Sulfa Antibiotics Shortness Of Breath  . Sulfasalazine Shortness Of Breath  . Lisinopril Cough  . Varenicline Other (See Comments)    Strange thoughts    Social History:   Social History   Socioeconomic History  . Marital status: Married    Spouse name: Not on file  . Number of children: Not on file  . Years of education: Not on file  . Highest education level: Not on file  Occupational History  . Not on file  Social Needs  . Financial resource strain: Not on file  . Food insecurity:    Worry: Not on file    Inability: Not on file  . Transportation needs:    Medical: Not on file    Non-medical: Not on file  Tobacco Use  . Smoking status: Former Smoker    Packs/day: 1.50    Years: 54.00    Pack years: 81.00    Types: Cigarettes    Last attempt to quit: 09/27/2014    Years since quitting: 3.7  . Smokeless tobacco: Never Used  . Tobacco comment: 05/02/2017 "quit vaping 09/2016"  Substance and Sexual Activity  . Alcohol use: Yes    Alcohol/week: 17.0 standard drinks    Types: 7 Cans of beer, 10 Shots of liquor per week    Comment: Daily shots, beer etc  . Drug use: No  . Sexual activity: Not Currently  Lifestyle  . Physical activity:    Days per week: Not on file    Minutes per session: Not on file  . Stress: Not on file  Relationships  . Social connections:    Talks on phone: Not on file    Gets together: Not on file    Attends religious service: Not on file    Active member of club or  organization: Not on file    Attends meetings of clubs or organizations: Not on file    Relationship status: Not on file  . Intimate partner violence:    Fear of current or ex partner: Not on file    Emotionally abused: Not on file    Physically abused: Not on file    Forced sexual activity: Not on file  Other Topics Concern  . Not on file  Social History Narrative  . Not on file    Family History:  Family History  Problem Relation Age of Onset  . Cancer Mother   . Heart disease Father   . Stroke Father      ROS:  Please see the history of present illness.  Some back and leg pain.  No fevers, chills, productive cough, hemoptysis, melena.  He does describe constipation. All other ROS reviewed and negative.     Physical Exam/Data:   Vitals:   07/13/18 0357 07/13/18 0813 07/13/18 0849 07/13/18 1235  BP: 126/85 122/68  115/65  Pulse: 88 84  82  Resp:      Temp: 98.1 F (36.7 C) 97.7 F (36.5 C)  97.7 F (36.5 C)  TempSrc: Oral Oral  Oral  SpO2: 99% 91% 97% 99%  Weight:      Height:        Intake/Output Summary (Last 24 hours) at 07/13/2018 1755 Last data filed at 07/13/2018 0300 Gross per 24 hour  Intake 173.36 ml  Output -  Net 173.36 ml   Filed Weights   07/10/18 0949  Weight: 104.3 kg   Body mass index is 36.02 kg/m.  General:  Obese, well developed, in no acute distress HEENT: normal Lymph: no adenopathy Neck: no JVD Endocrine:  No thryomegaly Vascular: No carotid bruits; FA pulses 2+ bilaterally without bruits  Cardiac: Tachycardic and irregular Lungs: Mild basilar crackles Abd: soft, nontender, no hepatomegaly  Ext: no edema Musculoskeletal:  No deformities, BUE and BLE strength normal and equal, status post back surgery Skin: warm and dry  Neuro:  CNs 2-12 intact, no focal abnormalities noted Psych:  Normal affect   EKG:  The EKG was personally reviewed and demonstrates: Sinus rhythm with PACs and nonspecific ST changes.  Prior inferior  infarct cannot be excluded. Telemetry:  Telemetry was personally reviewed and demonstrates: Sinus tachycardia with PACs and what appears to be runs of PAT.   Laboratory Data:  Chemistry Recent Labs  Lab 07/11/18 0335  NA 138  K 4.9  CL 100  CO2 29  GLUCOSE 148*  BUN 11  CREATININE 0.95  CALCIUM 8.5*  GFRNONAA >60  GFRAA >60  ANIONGAP 9    Hematology Recent Labs  Lab 07/11/18 0335  WBC 13.4*  RBC 3.06*  HGB 10.6*  HCT 33.5*  MCV 109.5*  MCH 34.6*  MCHC 31.6  RDW 15.7*  PLT 239   Radiology/Studies:  Dg Lumbar Spine 2-3 Views  Result Date: 07/10/2018 CLINICAL DATA:  T9-L5 fusion EXAM: LUMBAR SPINE - 2-3 VIEW; DG C-ARM 61-120 MIN COMPARISON:  Chest CT-06/29/2018 FINDINGS: Four spot intraoperative fluoroscopic images of the lower thoracic and lumbar spine are provided for review. Spinal labeling is difficult given the coned field of view and non definitive visualization of the lumbosacral junction. Provided images demonstrate sequela paraspinal fusion at least 2 level intervertebral disc space replacement. Provided images demonstrate anatomic alignment. Additional surgical support apparatus is seen posterior to the operative site. No definite radiopaque foreign body. IMPRESSION: Post long segment thoracolumbar paraspinal fusion and intervertebral disc space replacement. Electronically Signed   By: Sandi Mariscal M.D.   On: 07/10/2018 16:27   Dg Lumbar Spine 1 View  Result Date: 07/10/2018 CLINICAL DATA:  Elective surgery.  Localization of L1-2 PLIF. EXAM: LUMBAR SPINE - 1 VIEW COMPARISON:  MRI of the lumbar spine 05/18/2018 FINDINGS: Intraoperative imaging of lumbar spine demonstrates a surgical probe at the L1-2 disc level. IMPRESSION: Intraoperative localization of L1-2. Electronically Signed   By: San Morelle M.D.   On: 07/10/2018 16:48  Dg Chest Port 1 View  Result Date: 07/10/2018 CLINICAL DATA:  Shortness of breath postoperatively. EXAM: PORTABLE CHEST 1 VIEW  COMPARISON:  05/30/2018 FINDINGS: Interval spinal fusion. Worsened atelectasis in the left lower lobe with a small left effusion. Mild chronic scarring at the right lung base. Upper lungs are clear. IMPRESSION: Worsened atelectasis at the left base with a small left effusion. Electronically Signed   By: Nelson Chimes M.D.   On: 07/10/2018 19:34   Dg C-arm 1-60 Min  Result Date: 07/10/2018 CLINICAL DATA:  T9-L5 fusion EXAM: LUMBAR SPINE - 2-3 VIEW; DG C-ARM 61-120 MIN COMPARISON:  Chest CT-06/29/2018 FINDINGS: Four spot intraoperative fluoroscopic images of the lower thoracic and lumbar spine are provided for review. Spinal labeling is difficult given the coned field of view and non definitive visualization of the lumbosacral junction. Provided images demonstrate sequela paraspinal fusion at least 2 level intervertebral disc space replacement. Provided images demonstrate anatomic alignment. Additional surgical support apparatus is seen posterior to the operative site. No definite radiopaque foreign body. IMPRESSION: Post long segment thoracolumbar paraspinal fusion and intervertebral disc space replacement. Electronically Signed   By: Sandi Mariscal M.D.   On: 07/10/2018 16:27    Assessment and Plan:   1 question atrial fibrillation-I have reviewed the patient's telemetry.  He appears to be in sinus to sinus tachycardia with PACs and runs of PAT.  This may be related to the hyperadrenergic state associated with his recent back surgery.  I will discontinue Cardizem and increase Toprol to 50 mg daily.  Check echocardiogram for LV function.  Check TSH.  Follow on telemetry.  He certainly is at risk for atrial fibrillation given his baseline lung disease.  I am also concerned about his description of palpitations at home.  May need event monitor at discharge. CHADSvasc 3.  If atrial fibrillation documented would need anticoagulation long-term.  2 hypertension-blood pressure is controlled.  Continue present  medications and follow.  3 COPD-pulmonary following.  Question UIP on recent CT scan.  4 coronary calcification-noted on CT scan.  Continue statin.  If he does not require long-term anticoagulation will add aspirin.  5 status post back surgery-Per neurosurgery.  We will follow.  For questions or updates, please contact [facility] Please consult [email or web address] for contact info under     Signed, Kirk Ruths, MD  07/13/2018 5:55 PM

## 2018-07-13 NOTE — Progress Notes (Signed)
Physical Therapy Treatment Patient Details Name: [name] MRN: [id number] DOB: 05-03-1941 Today's Date: 07/13/2018    History of Present Illness The patient is a 77 year old white male who Dr. [name] performed an L2-3, L3-4 and L4-5 decompression, instrumentation and fusion.  He has developed recurrent back and leg pain. On 10/14 pt underwent L1-2 laminectomy and discectomy; exploration of lumbar fusion; posterior segmental instrumentation T9-L5. PMH: COPD, depression, RA, prostate cancer. PSH: previous back surgerys, L THA, hernia repair.    PT Comments    Pt pleasant on arrival, visiting with wife. Pt reports donned TLSO sitting EOB yesterday, attempted today with significant increased difficulty. Pt reporting new left hip pain throughout session. Pt with increased pain and SOB throughout session today, on 2L pt SpO2 90-93, DOE 3/4. Pt with two ambulation trials today of 60 and 44f. Pt HR up to 150-160 with prompt sitting and spike up to 170-180 in seated, 3 minute recovery to 125. Nurse aware. Pt HR limiting session today. Pt with decreased need for assistance in tranfers with improved balance and knee support during ambulation today.     Follow Up Recommendations  SNF     Equipment Recommendations  None recommended by PT    Recommendations for Other Services       Precautions / Restrictions Precautions Precautions: Back Precaution Comments: pt able to recall 3/3 back precautions Required Braces or Orthoses: Spinal Brace Spinal Brace: Applied in sitting position;Thoracolumbosacral orthotic    Mobility  Bed Mobility Overal bed mobility: Needs Assistance Bed Mobility: Rolling;Sidelying to Sit Rolling: Min guard Sidelying to sit: Min guard       General bed mobility comments: min guard for safety. pt with heavy reliance on rail, increased time for trunk elevation. good technique for log roll, minimal cues to bend knees before starting.   Transfers Overall transfer  level: Needs assistance Equipment used: Rolling walker (2 wheeled) Transfers: Sit to/from Stand Sit to Stand: Min guard         General transfer comment: demonstrating good hand placement, increased time. min guard for safety. pt with increased labored breathing today with sitting up. Hr up to 133 in sitting.   Ambulation/Gait Ambulation/Gait assistance: Min guard Gait Distance (Feet): 60 Feet(60, 20) Assistive device: Rolling walker (2 wheeled) Gait Pattern/deviations: Step-through pattern;Decreased stride length Gait velocity: slow Gait velocity interpretation: <1.31 ft/sec, indicative of household ambulator General Gait Details: pt HR up to 155 during ambulation, immediately on sitting HR rose to 170-180. 3 minute recovery back to 125 after first trial, second trial HR remained ~150 after 3 minutes. Two ambulation trials of 675fand 2066fith RW and min guard. bilat knee instability, noted SOB. Pt reports Left Hip pain through walking with need to stop after every few steps.    Stairs             Wheelchair Mobility    Modified Rankin (Stroke Patients Only)       Balance Overall balance assessment: Needs assistance Sitting-balance support: Feet supported;Bilateral upper extremity supported Sitting balance-Leahy Scale: Fair     Standing balance support: Bilateral upper extremity supported Standing balance-Leahy Scale: Fair Standing balance comment: dependent on RW                            Cognition Arousal/Alertness: Awake/alert Behavior During Therapy: WFL for tasks assessed/performed Overall Cognitive Status: Within Functional Limits for tasks assessed  Exercises      General Comments        Pertinent Vitals/Pain Pain Score: 8  Pain Location: surgical site at back, left hip  Pain Descriptors / Indicators: Discomfort;Operative site guarding;Aching;Sharp;Moaning;Grimacing;Guarding Pain  Intervention(s): Limited activity within patient's tolerance;Monitored during session;Premedicated before session;Repositioned    Home Living                      Prior Function            PT Goals (current goals can now be found in the care plan section) Progress towards PT goals: Not progressing toward goals - comment(pt limited by HR to 180s today.)    Frequency    Min 5X/week      PT Plan Current plan remains appropriate    Co-evaluation              AM-PAC PT "6 Clicks" Daily Activity  Outcome Measure  Difficulty turning over in bed (including adjusting bedclothes, sheets and blankets)?: Unable Difficulty moving from lying on back to sitting on the side of the bed? : Unable Difficulty sitting down on and standing up from a chair with arms (e.g., wheelchair, bedside commode, etc,.)?: Unable Help needed moving to and from a bed to chair (including a wheelchair)?: A Little Help needed walking in hospital room?: A Little Help needed climbing 3-5 steps with a railing? : A Lot 6 Click Score: 11    End of Session Equipment Utilized During Treatment: Gait belt;Back brace Activity Tolerance: Patient limited by pain;Treatment limited secondary to medical complications (Comment)(HR up to 180 ) Patient left: in chair;with family/visitor present;with call bell/phone within reach Nurse Communication: Mobility status PT Visit Diagnosis: Unsteadiness on feet (R26.81);Pain;Difficulty in walking, not elsewhere classified (R26.2) Pain - part of body: (back)     Time: 4967-5916 PT Time Calculation (min) (ACUTE ONLY): 34 min  Charges:  $Gait Training: 8-22 mins $Therapeutic Activity: 8-22 mins                     Samuella Bruin, [place]  Acute Rehab [phone]    Samuella Bruin 07/13/2018, 12:05 PM

## 2018-07-13 NOTE — Consult Note (Signed)
NAME:  GENERAL WEARING, MRN:  [id number], DOB:  25-Jul-1941, LOS: 3 ADMISSION DATE:  07/10/2018, CONSULTATION DATE:  07/13/2018 REFERRING MD:  [name] - CNS, CHIEF COMPLAINT:  Dyspnea.   Brief History   77 year old man who underwent a lumbar decompression and fusion with instrumentation. Post-operatively he has been noted to be more short of breath than at baseline. There have been episodes of desaturation with ambulation and tachycardia.  Consults: date of consult/date signed off & final recs:  PCCM 07/13/18   Procedures (surgical and bedside):  10/14: L1-2 laminectomy and discectomy; exploration of lumbar fusion; posterior segmental instrumentation T9-L5 with globus titanium pedicle screws and rods; posterior lateral arthrodesis T9-10, T10-11, T11-12, T12-L1, L1-2, L2-3, L3-4 and L4-5 with local morselized autograft bone, bone morphogenic protein soaked collagen sponges, Kinnex bone graft extender [name]).  Subjective:  The patient reports increasing dyspnea particularly with ambulation with the TLSO brace which he finds constricting. He is having incision related back pain but he doesn't feel it's impairing her breathing.  Objective   Blood pressure 115/65, pulse 82, temperature 97.7 F (36.5 C), temperature source Oral, resp. rate (!) 32, height _0  (1.702 m), weight 104.3 kg, SpO2 99 %.        Intake/Output Summary (Last 24 hours) at 07/13/2018 1450 Last data filed at 07/13/2018 0300 Gross per 24 hour  Intake 173.36 ml  Output -  Net 173.36 ml   Filed Weights   07/10/18 0949  Weight: 104.3 kg    Examination: General: Obese man. Awake and conversant. HENT: High neck circumference. Ancanthosis.  Lungs: Barrel chest with accessory muscle use and limited but symmetric chest excursion. No tracheal tug. Normal fremitus, no dullness. Vesicular breath sounds throughout with crackles at both bases which do not clear with coughing. Able to pull 1526m on I/S. Saturating 97% on  4lpm. Cardiovascular: moderate ankle edema with varicose veins. JVP was difficult to assess given neck girth. Heart sounds are normal. Rhythm irreg. Abdomen: protuberant but soft and non-tender. Extremities: Back incision healing well. Neuro: No focal deficits. GU: No Foley in place.  I performed a POC ultrasound: no DVT by compression ultrasonography. Limited echo showed normal LV/RV size and function and IVC not dilated. Lung ultrasound shows mild basilar interstitial disease consistent with known underlying lung disease.  Resolved Hospital Problem list    Assessment & Plan:   Assessment:  Increase in baseline dyspnea in patient with known COPD and OSA.  No wheezing or sputum production, so likely not COPD exacerbation.  Possible atelectasis on background of poor and progressive lung function.  New onset Afib in context of increase dyspnea post operatively.  PE a lower consideration given lack of clear worsening and negative venous scan, and negative echo.  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 As treatment involves immunosuppressives should be deferred until after recovers from surgery.   Plan:  Echocardiogram to evaluate LV function and LA size. Diltiazem for rate control. Continue current bronchodilators. Continue ambulation, encourage IS use and continued use of nocturnal PPV. Repeat chemistry, CBC.  Troponin to rule out subclinical cardiac event. Follow-up with Dr MLake Bellsas outpatient.    Disposition / Summary of Today's Plan 07/13/18   Initiate diltiazem for rate control   Labs and Ancillary Testing (personally reviewed)  CBC: mild leukocytosis 10/15  Chemistry: normal chemistry 10/15  ABG n/a  Coagulation Profile: n/a  Cardiac Enzymes: n/a  CBG: none  Microbiology: none  CT with HR 10/3: mixed fibrotic  lung disease and COPD, atherosclerosis.  Admitting History of Present Illness.   77 year old who underwent lumbar  fusion for back pain. Noted to be more dyspneic post-operatively with hypoxia with ambulation.  On questioning the patient has been more dyspneic over the past few months and unsure that his breathing is worse now but wife finds him more dyspneic.  Denies cough or chest pain. No worsening leg swelling,  Review of Systems:   Review of Systems  Constitutional: Negative.   HENT: Negative.   Eyes: Negative.   Respiratory: Positive for shortness of breath. Negative for sputum production.   Cardiovascular: Positive for leg swelling (bilateral ankle swelling at end of day.).  Gastrointestinal: Negative.   Genitourinary: Positive for frequency.  Musculoskeletal: Positive for back pain.  Skin: Negative.   Neurological: Negative.   Endo/Heme/Allergies: Negative.   Psychiatric/Behavioral: Negative.     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   Does not use home oxygen.   Surgical History    Past Surgical History:  Procedure Laterality Date  . BACK SURGERY    . COLONOSCOPY W/ POLYPECTOMY    . FRACTURE SURGERY    . HERNIA REPAIR    . HIP ARTHROPLASTY Left 04/2016  . INGUINAL HERNIA REPAIR Left 1986  . JOINT REPLACEMENT    . LUMBAR WOUND DEBRIDEMENT N/A 04/18/2017   Procedure: REPAIR OF LUMBAR PSEUDOMENINGOCELE;  Surgeon: Newman Pies, MD;  Location: [facility];  Service: Neurosurgery;  Laterality: N/A;  REAIR OF LUMBAR PSEUDOMENINGOCELE  . LUMBAR  WOUND DEBRIDEMENT N/A 05/05/2017   Procedure: I&D Lumbar Wound;  Surgeon: Newman Pies, MD;  Location: [facility];  Service: Neurosurgery;  Laterality: N/A;  . MAXIMUM ACCESS (MAS)POSTERIOR LUMBAR INTERBODY FUSION (PLIF) 3 LEVEL  02/17/2017   [name] 02/17/2017  . OPEN SURGICAL REPAIR OF GLUTEAL TENDON Right 11/02/2017   Procedure: Right hip gluteal tendon repair;  Surgeon: Gaynelle Arabian, MD;  Location: [facility];  Service: Orthopedics;  Laterality: Right;  . POSTERIOR LAMINECTOMY / DECOMPRESSION LUMBAR SPINE  2010  . PROSTATE BIOPSY    . PROSTATE BIOPSY  <2013 X 3  . SPLENECTOMY  1955  . TONSILLECTOMY    . TOTAL HIP ARTHROPLASTY Right 10/2015  . UMBILICAL HERNIA REPAIR  05/2016     Social History   Social History   Socioeconomic History  . Marital status: Married    Spouse name: Not on file  . Number of children: Not on file  . Years of education: Not on file  . Highest education level: Not on file  Occupational History  . Not on file  Social Needs  . Financial resource strain: Not on file  . Food insecurity:    Worry: Not on file    Inability: Not on file  . Transportation needs:    Medical: Not on file    Non-medical: Not on file  Tobacco Use  . Smoking status: Former Smoker    Packs/day: 1.50    Years: 54.00    Pack years: 81.00    Types: Cigarettes    Last attempt to quit: 09/27/2014  Years since quitting: 3.7  . Smokeless tobacco: Never Used  . Tobacco comment: 05/02/2017 "quit vaping 09/2016"  Substance and Sexual Activity  . Alcohol use: Yes    Alcohol/week: 17.0 standard drinks    Types: 7 Cans of beer, 10 Shots of liquor per week    Comment: Daily shots, beer etc  . Drug use: No  . Sexual activity: Not Currently  Lifestyle  . Physical activity:    Days per week: Not on file    Minutes per session: Not on file  . Stress: Not on file  Relationships  . Social connections:    Talks on phone: Not on file    Gets together: Not on file    Attends religious service:  Not on file    Active member of club or organization: Not on file    Attends meetings of clubs or organizations: Not on file    Relationship status: Not on file  . Intimate partner violence:    Fear of current or ex partner: Not on file    Emotionally abused: Not on file    Physically abused: Not on file    Forced sexual activity: Not on file  Other Topics Concern  . Not on file  Social History Narrative  . Not on file  ,  reports that he quit smoking about 3 years ago. His smoking use included cigarettes. He has a 81.00 pack-year smoking history. He has never used smokeless tobacco. He reports that he drinks about 17.0 standard drinks of alcohol per week. He reports that he does not use drugs.   Family history   His family history includes Cancer in his mother; Heart disease in his father; Stroke in his father.   Allergies Allergies  Allergen Reactions  . Sulfa Antibiotics Shortness Of Breath  . Sulfasalazine Shortness Of Breath  . Lisinopril Cough  . Varenicline Other (See Comments)    Strange thoughts    Home meds  Prior to Admission medications   Medication Sig Start Date End Date Taking? Authorizing Provider  albuterol (PROVENTIL HFA;VENTOLIN HFA) 108 (90 Base) MCG/ACT inhaler Inhale 2 puffs into the lungs every 6 (six) hours as needed for wheezing or shortness of breath.   Yes [provider]  BREO ELLIPTA 100-25 MCG/INH AEPB INHALE 1 PUFF INTO THE LUNGS ONCE DAILY Patient taking differently: Inhale 1 puff into the lungs daily.  05/31/18  Yes Juanito Doom, MD  ciprofloxacin (CIPRO) 750 MG tablet TAKE ONE TABLET BY MOUTH TWICE DAILY 07/06/18  Yes Tommy Medal, Lavell Islam, MD  docusate sodium (COLACE) 250 MG capsule Take 250-500 mg by mouth daily.   Yes [provider]  DULoxetine (CYMBALTA) 60 MG capsule Take 60 mg by mouth every evening.    Yes [provider]  finasteride (PROSCAR) 5 MG tablet Take 5 mg by mouth at bedtime.    Yes [provider]  fluticasone (FLONASE) 50 MCG/ACT nasal spray Place 2 sprays into both nostrils daily as needed for allergies.    Yes [provider]  folic acid (FOLVITE) 1 MG tablet Take 1 mg by mouth daily.   Yes [provider]  gabapentin (NEURONTIN) 300 MG capsule Take 600 mg by mouth 3 (three) times daily.   Yes [provider]  ibuprofen (ADVIL,MOTRIN) 200 MG tablet Take 400 mg by mouth 3 (three) times daily.   Yes [provider]  losartan (COZAAR) 50 MG tablet Take 1 tablet (50 mg total) by mouth  daily. 06/29/18  Yes Lauraine Rinne, NP  methotrexate 2.5 MG tablet Take 25 mg by mouth every Saturday.    Yes [provider]  metoprolol succinate (TOPROL-XL) 25 MG 24 hr tablet Take 25 mg by mouth at bedtime.   Yes [provider]  predniSONE (DELTASONE) 5 MG tablet Take 5 mg by mouth daily with breakfast.   Yes [provider]  sennosides-docusate sodium (SENOKOT-S) 8.6-50 MG tablet Take 2 tablets by mouth at bedtime as needed (for constipation.).    Yes [provider]  simvastatin (ZOCOR) 40 MG tablet Take 40 mg by mouth at bedtime.    Yes [provider]  sodium chloride (OCEAN) 0.65 % SOLN nasal spray Place 1 spray into both nostrils as needed for congestion.   Yes [provider]  tamsulosin (FLOMAX) 0.4 MG CAPS capsule Take 0.4 mg by mouth at bedtime.    Yes [provider]  Tiotropium Bromide Monohydrate (SPIRIVA RESPIMAT) 2.5 MCG/ACT AERS Inhale 2 puffs into the lungs daily. 07/05/17  Yes Juanito Doom, MD  traZODone (DESYREL) 50 MG tablet Take 50 mg by mouth at bedtime.   Yes [provider]  famotidine (PEPCID) 40 MG tablet Take 1 tablet (40 mg total) by mouth daily. 05/08/17   Kristeen Miss, MD  fluconazole (DIFLUCAN) 100 MG tablet Take 1 tablet (100 mg total) by mouth daily. 12/07/17   Truman Hayward, MD       Kipp Brood, MD [facility] ICU Physician [place]  Pager: [phone] Mobile: [phone] After hours: [phone].

## 2018-07-13 NOTE — Progress Notes (Signed)
Handoff from night nurse complete at this time.

## 2018-07-13 NOTE — Progress Notes (Signed)
Subjective: The patient is alert and pleasant.  His wife is at the bedside.  He has had some episodes of tachycardia and hypoxia when he stands and walks.  He feels the TLSO is too tight contributing to his shortness of breath.  Dr. Lake Bells is his pulmonologist.  Objective: Vital signs in last 24 hours: Temp:  [97.7 F (36.5 C)-99 F (37.2 C)] 97.7 F (36.5 C) (10/17 1235) Pulse Rate:  [82-160] 82 (10/17 1235) Resp:  [25-32] 32 (10/16 2300) BP: (115-130)/(65-111) 115/65 (10/17 1235) SpO2:  [91 %-99 %] 99 % (10/17 1235) Estimated body mass index is 36.02 kg/m as calculated from the following:   Height as of this encounter: 5' 7" (1.702 m).   Weight as of this encounter: 104.3 kg.   Intake/Output from previous day: 10/16 0701 - 10/17 0700 In: 413.4 [P.O.:240; I.V.:173.4] Out: -  Intake/Output this shift: No intake/output data recorded.  Physical exam the patient is alert and pleasant.  He is in no apparent distress.  His lower extremity strength is grossly normal.  Lab Results: Recent Labs    07/11/18 0335  WBC 13.4*  HGB 10.6*  HCT 33.5*  PLT 239   BMET Recent Labs    07/11/18 0335  NA 138  K 4.9  CL 100  CO2 29  GLUCOSE 148*  BUN 11  CREATININE 0.95  CALCIUM 8.5*    Studies/Results: No results found.  Assessment/Plan: Postop day #3: He is doing well neurologically.  We will continue to mobilize him with PT and OT.  We are awaiting skilled nursing facility placement.  Shortness of breath, hypoxia: I will order a chest x-ray.  I would ask pulmonary to see the patient.  LOS: 3 days     Ophelia Charter 07/13/2018, 2:01 PM

## 2018-07-13 NOTE — Social Work (Addendum)
Authorization received for pt to discharge to [facility] when medically appropriate. All paperwork complete for pt to discharge when ready.   Westley Hummer, MSW, [place] [facility] [phone]

## 2018-07-13 NOTE — Progress Notes (Signed)
Pt placed on CPAP, 3L of 02 bleed thought.  Nasal prongs secured.  Pt comfortable.

## 2018-07-13 NOTE — Care Management Important Message (Signed)
Important Message  Patient Details  Name: [name] MRN: [id number] Date of Birth: 01/12/1941   Medicare Important Message Given:  Yes    [name] 07/13/2018, 3:30 PM

## 2018-07-13 NOTE — Plan of Care (Signed)
  Problem: Education: Goal: Knowledge of General Education information will improve Description Including pain rating scale, medication(s)/side effects and non-pharmacologic comfort measures Outcome: Progressing   Problem: Health Behavior/Discharge Planning: Goal: Ability to manage health-related needs will improve Outcome: Progressing   Problem: Clinical Measurements: Goal: Ability to maintain clinical measurements within normal limits will improve Outcome: Progressing Goal: Will remain free from infection Outcome: Progressing Goal: Diagnostic test results will improve Outcome: Progressing Goal: Respiratory complications will improve Outcome: Progressing Goal: Cardiovascular complication will be avoided Outcome: Progressing   Problem: Activity: Goal: Risk for activity intolerance will decrease Outcome: Progressing   Problem: Nutrition: Goal: Adequate nutrition will be maintained Outcome: Progressing   Problem: Coping: Goal: Level of anxiety will decrease Outcome: Progressing   Problem: Elimination: Goal: Will not experience complications related to bowel motility Outcome: Progressing Goal: Will not experience complications related to urinary retention Outcome: Progressing   Problem: Pain Managment: Goal: General experience of comfort will improve Outcome: Progressing   Problem: Safety: Goal: Ability to remain free from injury will improve Outcome: Progressing   Problem: Skin Integrity: Goal: Risk for impaired skin integrity will decrease Outcome: Progressing   Problem: Medication Management: Goal: General experience of comfort will improve Outcome: Progressing

## 2018-07-14 ENCOUNTER — Inpatient Hospital Stay (HOSPITAL_COMMUNITY): Payer: Medicare Other

## 2018-07-14 DIAGNOSIS — R0600 Dyspnea, unspecified: Secondary | ICD-10-CM

## 2018-07-14 DIAGNOSIS — I471 Supraventricular tachycardia: Secondary | ICD-10-CM

## 2018-07-14 LAB — TROPONIN I: Troponin I: <0.03 ng/mL (ref <0.03)

## 2018-07-14 MED ORDER — METOPROLOL TARTRATE 5 MG/5ML IV SOLN
5.0000 mg | INTRAVENOUS | Status: AC | PRN
  Administered 2018-07-14 (×3): 5 mg via INTRAVENOUS
  Filled 2018-07-14 (×3): qty 5

## 2018-07-14 MED ORDER — ATORVASTATIN CALCIUM 20 MG PO TABS
20.0000 mg | ORAL_TABLET | Freq: Every day | ORAL | Status: DC
  Administered 2018-07-14 – 2018-07-17 (×4): 20 mg via ORAL
  Filled 2018-07-14 (×4): qty 1

## 2018-07-14 NOTE — Progress Notes (Signed)
Physical Therapy Treatment Patient Details Name: [name] MRN: [id number] DOB: Apr 01, 1941 Today's Date: 07/14/2018    History of Present Illness The patient is a 77 year old white male who Dr. [name] performed an L2-3, L3-4 and L4-5 decompression, instrumentation and fusion.  He has developed recurrent back and leg pain. On 10/14 pt underwent L1-2 laminectomy and discectomy; exploration of lumbar fusion; posterior segmental instrumentation T9-L5. PMH: COPD, depression, RA, prostate cancer. PSH: previous back surgerys, L THA, hernia repair.    PT Comments    Pt again requesting brace be donned in supine but in order to do so straps must be loosened fully to fit around abdomen in sitting with total assist to don/doff brace during session. Pt with good ability with bed mobility and standing but reports sharp cramping pain of left hip and anterior thigh as well as dizziness impacting function during session. Pt only able to walk just outside of door today and then sit. Unable to advance gait trials with bil knee instability on standing 3rd trials and increased HR and BP with 4th trial with HR up to 170 and BP 132/105. Pt in sinus tach during session with a few periods of PAC. RN aware of session and return to bed.    Follow Up Recommendations  SNF     Equipment Recommendations  None recommended by PT    Recommendations for Other Services       Precautions / Restrictions Precautions Precautions: Back Required Braces or Orthoses: Spinal Brace Spinal Brace: Applied in sitting position;Thoracolumbosacral orthotic    Mobility  Bed Mobility Overal bed mobility: Needs Assistance Bed Mobility: Rolling;Sidelying to Sit;Sit to Sidelying Rolling: Min guard Sidelying to sit: Min guard     Sit to sidelying: Mod assist General bed mobility comments: guarding for safety with use of rail, increased time to rise from surface. Return to bed required assist to elevate legs to surface. Pt  requested return to bed due to back pain  Transfers Overall transfer level: Needs assistance   Transfers: Sit to/from Stand Sit to Stand: Min guard         General transfer comment: guarding for safety with good hand placement x 2 trials from bed and 3 from chair. Initial stand pt stated dizziness with BP 104/71 (78) HR 129. Pt sat then able to stand and walk short distance. On 3rd stand pt with bil partial knee buckling and unable to advance legs stating not feeling right and left thigh pain. Pt with HR up to 150 with SpO2 dropping to 85% on RA with cues for breathing and returned to sitting.   Ambulation/Gait   Gait Distance (Feet): 10 Feet Assistive device: Rolling walker (2 wheeled) Gait Pattern/deviations: Step-through pattern;Decreased stride length   Gait velocity interpretation: <1.8 ft/sec, indicate of risk for recurrent falls General Gait Details: pt with HR 145 with gait today and limited by left thigh pain, dizziness and "not feeling right". BP stable with standing trials. Pt with close chair follow for safety and despite several attempts unable to ambulate further today, returned to room in chair and to bed   Stairs             Wheelchair Mobility    Modified Rankin (Stroke Patients Only)       Balance Overall balance assessment: Needs assistance Sitting-balance support: Feet supported;Bilateral upper extremity supported Sitting balance-Leahy Scale: Fair     Standing balance support: Bilateral upper extremity supported Standing balance-Leahy Scale: Poor  Cognition Arousal/Alertness: Awake/alert Behavior During Therapy: WFL for tasks assessed/performed Overall Cognitive Status: Impaired/Different from baseline Area of Impairment: Orientation;Problem solving                 Orientation Level: Disoriented to;Time           Problem Solving: Slow processing General Comments: pt initially asking for  Sunday paper, increased processing time and needing plan and cues repeated at times      Exercises      General Comments        Pertinent Vitals/Pain Pain Score: 9  Pain Location: left anterior thigh and back, 6 supine up to 9 in standing Pain Descriptors / Indicators: Discomfort;Operative site guarding;Aching;Sharp;Moaning;Grimacing;Guarding Pain Intervention(s): Limited activity within patient's tolerance;Repositioned;Monitored during session;Premedicated before session;Patient requesting pain meds-RN notified    Home Living                      Prior Function            PT Goals (current goals can now be found in the care plan section) Progress towards PT goals: Not progressing toward goals - comment(limited by pain, fatigue and cardiopulmonary status)    Frequency    Min 5X/week      PT Plan Current plan remains appropriate    Co-evaluation              AM-PAC PT "6 Clicks" Daily Activity  Outcome Measure  Difficulty turning over in bed (including adjusting bedclothes, sheets and blankets)?: Unable Difficulty moving from lying on back to sitting on the side of the bed? : Unable Difficulty sitting down on and standing up from a chair with arms (e.g., wheelchair, bedside commode, etc,.)?: A Lot Help needed moving to and from a bed to chair (including a wheelchair)?: A Little Help needed walking in hospital room?: A Little Help needed climbing 3-5 steps with a railing? : A Lot 6 Click Score: 12    End of Session Equipment Utilized During Treatment: Gait belt;Back brace Activity Tolerance: Patient limited by pain;Treatment limited secondary to medical complications (Comment)(HR up to 170 with final transfer) Patient left: in bed;with call bell/phone within reach;with family/visitor present;with nursing/sitter in room Nurse Communication: Mobility status;Precautions PT Visit Diagnosis: Unsteadiness on feet (R26.81);Pain;Difficulty in walking, not  elsewhere classified (R26.2)     Time: 2979-8921 PT Time Calculation (min) (ACUTE ONLY): 48 min  Charges:  $Gait Training: 8-22 mins $Therapeutic Activity: 23-37 mins                     [place], PT Acute Rehabilitation Services Pager: [phone] Office: [place] 07/14/2018, 8:57 AM

## 2018-07-14 NOTE — Progress Notes (Signed)
Clinical Social Worker following patient for support and discharge needs. Patient has bed ready at  [facility] and authorization through insurance has been obtained. Facility stated if patient does not discharge today he will be unable to come to facility until Monday. Patient able to discharge once medically cleared by MD. CSW will continue to follow for support and discharge need.   Rhea Pink, MSW,  [place]

## 2018-07-14 NOTE — Progress Notes (Signed)
Subjective: The patient is alert and pleasant.  He is in no apparent distress.  He has no complaints.  Objective: Vital signs in last 24 hours: Temp:  [97.7 F (36.5 C)-98.8 F (37.1 C)] 97.8 F (36.6 C) (10/18 0356) Pulse Rate:  [82-128] 103 (10/18 0356) Resp:  [22-34] 34 (10/18 0356) BP: (102-123)/(65-80) 102/71 (10/18 0356) SpO2:  [86 %-99 %] 86 % (10/18 0356) Estimated body mass index is 36.02 kg/m as calculated from the following:   Height as of this encounter: _0  (1.702 m).   Weight as of this encounter: 104.3 kg.   Intake/Output from previous day: 10/17 0701 - 10/18 0700 In: -  Out: 740 [Urine:875] Intake/Output this shift: No intake/output data recorded.  Physical exam patient is alert and oriented.  His lower extremity strength is grossly normal.  Lab Results: Recent Labs    07/13/18 1858  WBC 14.7*  HGB 9.8*  HCT 30.7*  PLT 244   BMET Recent Labs    07/13/18 1858  NA 134*  K 3.7  CL 100  CO2 26  GLUCOSE 120*  BUN 15  CREATININE 0.84  CALCIUM 8.0*    Studies/Results: Dg Chest 2 View  Result Date: 07/13/2018 CLINICAL DATA:  History of recent back surgery, now with hypoxia EXAM: CHEST - 2 VIEW COMPARISON:  07/10/2018, 06/29/2018 radiograph and CT FINDINGS: Mild bibasilar atelectasis and fibrosis. No acute consolidation or pleural effusion. Enlarged cardiomediastinal silhouette with aortic atherosclerosis. No pneumothorax. Postsurgical changes in the lower thoracic spine. IMPRESSION: 1. Bibasilar atelectasis and scarring with tiny left effusion, not much interval change since 07/10/2018. 2. Cardiomegaly Electronically Signed   By: Donavan Foil M.D.   On: 07/13/2018 22:09    Assessment/Plan: Postop day #4: The patient is doing well neurologically.  He is ready for transfer to the rehab facility when the proper arrangements have been made.  Hypoxia, tachycardia: I appreciate pulmonary and cardiology's help with this.  LOS: 4 days     Ophelia Charter 07/14/2018, 8:00 AM

## 2018-07-14 NOTE — Consult Note (Signed)
NAME:  [name], MRN:  [id number], DOB:  29-Jun-1941, LOS: 4 ADMISSION DATE:  07/10/2018, CONSULTATION DATE:  07/13/2018 REFERRING MD:  [name] - CNS, CHIEF COMPLAINT:  Dyspnea.   Brief History   77 year old man who underwent a lumbar decompression and fusion with instrumentation. Post-operatively he has been noted to be more short of breath than at baseline. There have been episodes of desaturation with ambulation and tachycardia.  Consults: date of consult/date signed off & final recs:  PCCM 07/13/18   Procedures (surgical and bedside):  10/14: L1-2 laminectomy and discectomy; exploration of lumbar fusion; posterior segmental instrumentation T9-L5 with globus titanium pedicle screws and rods; posterior lateral arthrodesis T9-10, T10-11, T11-12, T12-L1, L1-2, L2-3, L3-4 and L4-5 with local morselized autograft bone, bone morphogenic protein soaked collagen sponges, Kinnex bone graft extender [name]).  Subjective:  Continued complaints of dyspnea with exertion Did not do well with PTOT today Sats are 99% on RA at rest in bed Objective   Blood pressure 113/68, pulse 90, temperature 98.2 F (36.8 C), temperature source Oral, resp. rate (!) 26, height _0  (1.702 m), weight 104.3 kg, SpO2 96 %.        Intake/Output Summary (Last 24 hours) at 07/14/2018 1149 Last data filed at 07/14/2018 0800 Gross per 24 hour  Intake 0 ml  Output 875 ml  Net -875 ml   Filed Weights   07/10/18 0949  Weight: 104.3 kg    Examination: General: Awake and alert male supine in bed, appropriate HENT: High neck circumference. Ancanthosis. No LAD Lungs:Bilateral chest excursion, barrel chest, NAD while at rest, Breath sounds clear,m but very diminished per bases.Vesicular breath sounds throughout with crackles bilaterally per bases. Able to pull 1552m on I/S. Sats 99% on RA Cardiovascular: 1-2 + ankle edema,   S1, S2, , irregular RR ,   If Fib vs ST with intermittent PAT. Abdomen: protuberant  but soft and non-tender, BS +, obese Extremities: Back incision healing well, No obvious deformities Neuro: No focal deficits. GU: No Foley in place. Dr. ALynetta Mare performed a POC ultrasound: no DVT by compression ultrasonography. Limited echo showed normal LV/RV size and function and IVC not dilated. Lung ultrasound shows mild basilar interstitial disease consistent with known underlying lung disease.  Resolved Hospital Problem list    Assessment & Plan:   Assessment:  Increase in baseline dyspnea in patient with known COPD and OSA.  No wheezing or sputum production, so likely not COPD exacerbation.  HRCT done 06-29-2018>> ILD stable since last CT 12-19-2017 Questionable PAH per dilated main pulmonary artery Possible atelectasis on background of poor and progressive lung function. Plan: Aggressive Pulmonary Toilet IS Q 1 while awake Nocturnal PPV PTOT>> OOB  And ambulation Continue current BD and maintenance medications CXR prn Will need follow up with Pulmonary as OP Will need further eval of questionable PAH as contributing cause of dyspnea ( along with  IPF) Will need ambulatory saturation walk prior to DC Scheduled hospital follow up 08-04-2018 with BWyn QuakerNP Scheduled Follow up With Dr. [name]11-27-2019  New onset  Afib vs  ST with intermittent PAT. increase dyspnea from baseline with exertion post operatively.  PE a lower consideration given lack of clear worsening and negative venous scan, and negative echo. Plan>> per cards consult Continue Toprol Echo ( pending) to evaluate LV function and LA size,and  PAP. 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  Plan: As treatment involves immunosuppressives antifibrotics  should be deferred until after recovers from surgery, at follow up with Dr.  Lake Bells-- [name], NP. Will need 6 minute walk when able Will need Spirometry with DLCA  Leukocytosis Afebrile Chronic prednisone Plan Trend CBC CXR prn Culture as is clinically indicated     Disposition / Summary of Today's Plan 07/14/18   States his dyspnea is better today at rest, still worse with exertion He is deconditioned and did not tolerate PT-OT HR better controlled on Toprol Echo pending Needs aggressive pulmonary toilet Explained he needs to get OOB as much as he is able IS Q 1 while awake Follow up pulmonary appointments made, and dates and times given to wife   Labs and Ancillary Testing (personally reviewed)  CBC: mild leukocytosis 10/15  Chemistry: normal chemistry 10/15  ABG n/a  Coagulation Profile: n/a  Cardiac Enzymes: n/a  CBG: none  Microbiology: none  CT with HR 10/3: mixed fibrotic lung disease and COPD, atherosclerosis.  Admitting History of Present Illness.   77 year old who underwent lumbar fusion for back pain. Noted to be more dyspneic post-operatively with hypoxia with ambulation.  On questioning the patient has been more dyspneic over the past few months and unsure that his breathing is worse now but wife finds him more dyspneic.  Denies cough or chest pain. 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   Does not use home oxygen.   Surgical History    Past  Surgical History:  Procedure Laterality Date  . BACK SURGERY    . COLONOSCOPY W/ POLYPECTOMY    . FRACTURE SURGERY    . HERNIA REPAIR    . HIP ARTHROPLASTY Left 04/2016  . INGUINAL HERNIA REPAIR Left 1986  . JOINT REPLACEMENT    . LUMBAR WOUND DEBRIDEMENT N/A 04/18/2017   Procedure: REPAIR OF LUMBAR PSEUDOMENINGOCELE;  Surgeon: Newman Pies, MD;  Location: [facility];  Service: Neurosurgery;  Laterality: N/A;  REAIR OF LUMBAR PSEUDOMENINGOCELE  . LUMBAR WOUND DEBRIDEMENT N/A 05/05/2017   Procedure: I&D Lumbar Wound;  Surgeon: Newman Pies, MD;  Location: [facility];  Service: Neurosurgery;  Laterality: N/A;  . MAXIMUM ACCESS (MAS)POSTERIOR LUMBAR INTERBODY FUSION (PLIF) 3 LEVEL  02/17/2017   [name] 02/17/2017  . OPEN SURGICAL REPAIR OF GLUTEAL TENDON Right 11/02/2017   Procedure: Right hip gluteal tendon repair;  Surgeon: Gaynelle Arabian, MD;  Location: [facility];  Service: Orthopedics;  Laterality: Right;  . POSTERIOR LAMINECTOMY / DECOMPRESSION LUMBAR SPINE  2010  . PROSTATE BIOPSY    . PROSTATE BIOPSY  <2013 X 3  . SPLENECTOMY  1955  . TONSILLECTOMY    . TOTAL HIP ARTHROPLASTY Right 10/2015  . UMBILICAL HERNIA REPAIR  05/2016     Social History   Social History   Socioeconomic History  . Marital status: Married    Spouse name: Not on file  . Number of children: Not on file  . Years of education: Not on file  . Highest  education level: Not on file  Occupational History  . Not on file  Social Needs  . Financial resource strain: Not on file  . Food insecurity:    Worry: Not on file    Inability: Not on file  . Transportation needs:    Medical: Not on file    Non-medical: Not on file  Tobacco Use  . Smoking status: Former Smoker    Packs/day: 1.50    Years: 54.00    Pack years: 81.00    Types: Cigarettes    Last attempt to quit: 09/27/2014    Years since quitting: 3.7  . Smokeless tobacco: Never Used  . Tobacco comment: 05/02/2017 "quit vaping 09/2016"  Substance and Sexual  Activity  . Alcohol use: Yes    Alcohol/week: 17.0 standard drinks    Types: 7 Cans of beer, 10 Shots of liquor per week    Comment: Daily shots, beer etc  . Drug use: No  . Sexual activity: Not Currently  Lifestyle  . Physical activity:    Days per week: Not on file    Minutes per session: Not on file  . Stress: Not on file  Relationships  . Social connections:    Talks on phone: Not on file    Gets together: Not on file    Attends religious service: Not on file    Active member of club or organization: Not on file    Attends meetings of clubs or organizations: Not on file    Relationship status: Not on file  . Intimate partner violence:    Fear of current or ex partner: Not on file    Emotionally abused: Not on file    Physically abused: Not on file    Forced sexual activity: Not on file  Other Topics Concern  . Not on file  Social History Narrative  . Not on file  ,  reports that he quit smoking about 3 years ago. His smoking use included cigarettes. He has a 81.00 pack-year smoking history. He has never used smokeless tobacco. He reports that he drinks about 17.0 standard drinks of alcohol per week. He reports that he does not use drugs.   Family history   His family history includes Cancer in his mother; Heart disease in his father; Stroke in his father.   Allergies Allergies  Allergen Reactions  . Sulfa Antibiotics Shortness Of Breath  . Sulfasalazine Shortness Of Breath  . Lisinopril Cough  . Varenicline Other (See Comments)    Strange thoughts    Home meds  Prior to Admission medications   Medication Sig Start Date End Date Taking? Authorizing Provider  albuterol (PROVENTIL HFA;VENTOLIN HFA) 108 (90 Base) MCG/ACT inhaler Inhale 2 puffs into the lungs every 6 (six) hours as needed for wheezing or shortness of breath.   Yes [provider]  BREO ELLIPTA 100-25 MCG/INH AEPB INHALE 1 PUFF INTO THE LUNGS ONCE DAILY Patient taking differently: Inhale 1 puff  into the lungs daily.  05/31/18  Yes Juanito Doom, MD  ciprofloxacin (CIPRO) 750 MG tablet TAKE ONE TABLET BY MOUTH TWICE DAILY 07/06/18  Yes Tommy Medal, Lavell Islam, MD  docusate sodium (COLACE) 250 MG capsule Take 250-500 mg by mouth daily.   Yes [provider]  DULoxetine (CYMBALTA) 60 MG capsule Take 60 mg by mouth every evening.    Yes [provider]  finasteride (PROSCAR) 5 MG tablet Take 5 mg by mouth at bedtime.    Yes [provider]  fluticasone (FLONASE) 50 MCG/ACT nasal spray Place 2 sprays into both nostrils daily as needed for allergies.    Yes [provider]  folic acid (FOLVITE) 1 MG tablet Take 1 mg by mouth daily.   Yes [provider]  gabapentin (NEURONTIN) 300 MG capsule Take 600 mg by mouth 3 (three) times daily.   Yes [provider]  ibuprofen (ADVIL,MOTRIN) 200 MG tablet Take 400 mg by mouth 3 (three) times daily.   Yes [provider]  losartan (COZAAR) 50 MG tablet Take 1 tablet (50 mg total) by mouth daily. 06/29/18  Yes Lauraine Rinne, NP  methotrexate 2.5 MG tablet Take 25 mg by mouth every Saturday.    Yes [provider]  metoprolol succinate (TOPROL-XL) 25 MG 24 hr tablet Take 25 mg by mouth at bedtime.   Yes [provider]  predniSONE (DELTASONE) 5 MG tablet Take 5 mg by mouth daily with breakfast.   Yes [provider]  sennosides-docusate sodium (SENOKOT-S) 8.6-50 MG tablet Take 2 tablets by mouth at bedtime as needed (for constipation.).    Yes [provider]  simvastatin (ZOCOR) 40 MG tablet Take 40 mg by mouth at bedtime.    Yes [provider]  sodium chloride (OCEAN) 0.65 % SOLN nasal spray Place 1 spray into both nostrils as needed for congestion.   Yes [provider]  tamsulosin (FLOMAX) 0.4 MG CAPS capsule Take 0.4 mg by mouth at bedtime.    Yes [provider]  Tiotropium Bromide Monohydrate (SPIRIVA RESPIMAT) 2.5 MCG/ACT AERS  Inhale 2 puffs into the lungs daily. 07/05/17  Yes Juanito Doom, MD  traZODone (DESYREL) 50 MG tablet Take 50 mg by mouth at bedtime.   Yes [provider]  famotidine (PEPCID) 40 MG tablet Take 1 tablet (40 mg total) by mouth daily. 05/08/17   Kristeen Miss, MD  fluconazole (DIFLUCAN) 100 MG tablet Take 1 tablet (100 mg total) by mouth daily. 12/07/17   Truman Hayward, MD       Magdalen Spatz, AGACNP-BC [facility]  Pager  [phone].  07/14/2018 12:48 PM

## 2018-07-14 NOTE — Progress Notes (Signed)
[name], male   DOB: 06-Jun-1941, 77 y.o.   MRN: [id number]  Pulmonary critical care attending:  77 year old man with questionable increased dyspnea following back surgery.  He has known mixed restrictive and obstructive lung disease as well as obesity related sleep apnea.  Today, he has not ambulated as he feels lightheaded when he stands up.  He has been found to have a supraventricular tachycardia currently being evaluated by cardiology.  On my examination today: He is in no respiratory distress and is saturating 97% on 2 L nasal cannula.  Apparently his saturation has been fine on room air earlier on.  He is barrel chested but his lungs are clear his crackles are unchanged.  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  These appear to be close to baseline.  I have made no recommendations to change his bronchodilator therapy and he can follow-up on discharge with Dr. Lake Bells, his usual pulmonologist.  Supraventricular tachycardia, likely cause of episodes of dizziness and decreased exercise tolerance.  Unclear still whether this is atrial fibrillation or PSVT.  Cardiology is following.  Trial of increased Alcian channel blocker to control heart rate.  Pulmonary will sign off at this time.  Please reconsult if further issues arise.

## 2018-07-14 NOTE — Progress Notes (Signed)
  Echocardiogram 2D Echocardiogram has been attempted. Patient HR 130-140's too high to complete echo.  Charlayne Vultaggio G Kasidi Shanker 07/14/2018, 2:49 PM

## 2018-07-14 NOTE — Progress Notes (Signed)
Progress Note  Patient Name: [name] Date of Encounter: 07/14/2018  Primary Cardiologist: Dr Stanford Breed  Subjective   No CP; mild dyspnea  Inpatient Medications    Scheduled Meds: . cephALEXin  1,000 mg Oral Q6H  . ciprofloxacin  750 mg Oral BID  . docusate sodium  200 mg Oral Daily  . DULoxetine  60 mg Oral QPM  . famotidine  40 mg Oral Daily  . finasteride  5 mg Oral QHS  . fluticasone furoate-vilanterol  1 puff Inhalation Daily  . folic acid  1 mg Oral Daily  . gabapentin  600 mg Oral TID  . losartan  50 mg Oral Daily  . metoprolol succinate  50 mg Oral QHS  . predniSONE  5 mg Oral Q breakfast  . simvastatin  40 mg Oral QHS  . sodium chloride flush  3 mL Intravenous Q12H  . tamsulosin  0.4 mg Oral QHS  . traZODone  50 mg Oral QHS  . umeclidinium bromide  1 puff Inhalation Daily   Continuous Infusions: . sodium chloride Stopped (07/11/18 1541)  . lactated ringers Stopped (07/10/18 1630)   PRN Meds: acetaminophen **OR** acetaminophen, albuterol, bisacodyl, cyclobenzaprine, fluticasone, menthol-cetylpyridinium **OR** phenol, morphine injection, ondansetron **OR** ondansetron (ZOFRAN) IV, oxyCODONE, oxyCODONE, sodium chloride, sodium chloride flush   Vital Signs    Vitals:   07/14/18 0356 07/14/18 0803 07/14/18 0845 07/14/18 0850  BP: 102/71 111/74    Pulse: (!) 103 94 (!) 150   Resp: (!) 34     Temp: 97.8 F (36.6 C) 98.1 F (36.7 C)    TempSrc: Oral Oral    SpO2: (!) 86% 91% 92% 92%  Weight:      Height:        Intake/Output Summary (Last 24 hours) at 07/14/2018 0927 Last data filed at 07/14/2018 0800 Gross per 24 hour  Intake 0 ml  Output 875 ml  Net -875 ml   Filed Weights   07/10/18 0949  Weight: 104.3 kg    Telemetry    Sinus with PAT- Personally Reviewed   Physical Exam   GEN: No acute distress.  Obese Neck: No JVD Cardiac: RRR, no murmurs, rubs, or gallops.  Respiratory: Clear to auscultation bilaterally. GI: Soft,  nontender, non-distended  MS: No edema Neuro:  Nonfocal  Psych: Normal affect   Labs    Chemistry Recent Labs  Lab 07/11/18 0335 07/13/18 1858  NA 138 134*  K 4.9 3.7  CL 100 100  CO2 29 26  GLUCOSE 148* 120*  BUN 11 15  CREATININE 0.95 0.84  CALCIUM 8.5* 8.0*  GFRNONAA >60 >60  GFRAA >60 >60  ANIONGAP 9 8     Hematology Recent Labs  Lab 07/11/18 0335 07/13/18 1858  WBC 13.4* 14.7*  RBC 3.06* 2.78*  HGB 10.6* 9.8*  HCT 33.5* 30.7*  MCV 109.5* 110.4*  MCH 34.6* 35.3*  MCHC 31.6 31.9  RDW 15.7* 15.2  PLT 239 244    Cardiac Enzymes Recent Labs  Lab 07/13/18 1858 07/13/18 2219 07/14/18 0308  TROPONINI <0.03 <0.03 <0.03    BNP Recent Labs  Lab 07/13/18 1858  BNP 120.0*      Radiology    Dg Chest 2 View  Result Date: 07/13/2018 CLINICAL DATA:  History of recent back surgery, now with hypoxia EXAM: CHEST - 2 VIEW COMPARISON:  07/10/2018, 06/29/2018 radiograph and CT FINDINGS: Mild bibasilar atelectasis and fibrosis. No acute consolidation or pleural effusion. Enlarged cardiomediastinal silhouette with aortic atherosclerosis. No pneumothorax.  Postsurgical changes in the lower thoracic spine. IMPRESSION: 1. Bibasilar atelectasis and scarring with tiny left effusion, not much interval change since 07/10/2018. 2. Cardiomegaly Electronically Signed   By: Donavan Foil M.D.   On: 07/13/2018 22:09    Patient Profile     77 y.o. male with past medical history of rheumatoid arthritis, prostate cancer and status post back surgery being evaluated for possible atrial fibrillation.  Assessment & Plan    1 question atrial fibrillation-I have again reviewed the patient's telemetry.  He appears to have sinus to sinus tachycardia with intermittent paroxysmal atrial tachycardia.  I am not convinced he has had atrial fibrillation at this point.  We will continue telemetry.  Continue Toprol.  Await echocardiogram.  If he has documented atrial fibrillation he would require  anticoagulation.  If no documentation while in-house would arrange outpatient event monitor as he describes occasional palpitations at night.    2 hypertension-blood pressure is controlled.  Continue present medications and follow.  3 COPD-pulmonary following and managing.  Question UIP on recent CT scan.  4 coronary calcification-noted on CT scan.  Continue statin.  If he does not require long-term anticoagulation will add aspirin.  5 status post back surgery-Per neurosurgery.  For questions or updates, please contact [place] Please consult [email or web address] for contact info under        Signed, Kirk Ruths, MD  07/14/2018, 9:27 AM

## 2018-07-15 ENCOUNTER — Inpatient Hospital Stay (HOSPITAL_COMMUNITY): Payer: Medicare Other

## 2018-07-15 DIAGNOSIS — I361 Nonrheumatic tricuspid (valve) insufficiency: Secondary | ICD-10-CM

## 2018-07-15 DIAGNOSIS — I370 Nonrheumatic pulmonary valve stenosis: Secondary | ICD-10-CM

## 2018-07-15 LAB — AEROBIC/ANAEROBIC CULTURE (SURGICAL/DEEP WOUND): GRAM STAIN: NONE SEEN

## 2018-07-15 LAB — AEROBIC/ANAEROBIC CULTURE W GRAM STAIN (SURGICAL/DEEP WOUND): Culture: NO GROWTH

## 2018-07-15 MED ORDER — HEPARIN SODIUM (PORCINE) 5000 UNIT/ML IJ SOLN
5000.0000 [IU] | Freq: Three times a day (TID) | INTRAMUSCULAR | Status: DC
  Administered 2018-07-15 – 2018-07-17 (×7): 5000 [IU] via SUBCUTANEOUS
  Filled 2018-07-15 (×7): qty 1

## 2018-07-15 MED ORDER — PERFLUTREN LIPID MICROSPHERE
1.0000 mL | INTRAVENOUS | Status: AC | PRN
  Administered 2018-07-15: 2 mL via INTRAVENOUS
  Filled 2018-07-15: qty 10

## 2018-07-15 MED ORDER — SODIUM CHLORIDE 0.9 % IV BOLUS
500.0000 mL | Freq: Once | INTRAVENOUS | Status: AC
  Administered 2018-07-15: 500 mL via INTRAVENOUS

## 2018-07-15 MED ORDER — METOPROLOL TARTRATE 50 MG PO TABS
50.0000 mg | ORAL_TABLET | Freq: Three times a day (TID) | ORAL | Status: DC
  Administered 2018-07-15 – 2018-07-16 (×3): 50 mg via ORAL
  Filled 2018-07-15 (×5): qty 1

## 2018-07-15 NOTE — Progress Notes (Addendum)
  Echocardiogram 2D Echocardiogram has been performed with [facility].  [name] 07/15/2018, 4:40 PM

## 2018-07-15 NOTE — Procedures (Signed)
Echo attempted. Pt using restroom. Will return later.

## 2018-07-15 NOTE — Progress Notes (Signed)
Pt set up with CPAP for the night- no issues noted at time of set up.

## 2018-07-15 NOTE — Progress Notes (Signed)
Neurosurgery Service Progress Note  Subjective: No acute events overnight, SVT vs Afib yesterday, now resolved  Objective: Vitals:   07/15/18 0100 07/15/18 0300 07/15/18 0926 07/15/18 0936  BP:  132/69  122/68  Pulse: 73 (!) 112  93  Resp: (!) _0 Temp:  97.6 F (36.4 C)  98.1 F (36.7 C)  TempSrc:  Oral  Oral  SpO2: 97% 97% 98% 98%  Weight:      Height:       Temp (24hrs), Avg:98.2 F (36.8 C), Min:97.6 F (36.4 C), Max:99.4 F (37.4 C)  CBC Latest Ref Rng & Units 07/13/2018 07/11/2018 07/03/2018  WBC 4.0 - 10.5 K/uL 14.7(H) 13.4(H) 8.2  Hemoglobin 13.0 - 17.0 g/dL 9.8(L) 10.6(L) 13.6  Hematocrit 39.0 - 52.0 % 30.7(L) 33.5(L) 41.9  Platelets 150 - 400 K/uL 244 239 271   BMP Latest Ref Rng & Units 07/13/2018 07/11/2018 07/03/2018  Glucose 70 - 99 mg/dL 120(H) 148(H) 100(H)  BUN 8 - 23 mg/dL _1 Creatinine 0.61 - 1.24 mg/dL 0.84 0.95 0.94  BUN/Creat Ratio 6 - 22 (calc) - - -  Sodium 135 - 145 mmol/L 134(L) 138 135  Potassium 3.5 - 5.1 mmol/L 3.7 4.9 4.0  Chloride 98 - 111 mmol/L 100 100 103  CO2 22 - 32 mmol/L _2 Calcium 8.9 - 10.3 mg/dL 8.0(L) 8.5(L) 8.7(L)    Intake/Output Summary (Last 24 hours) at 07/15/2018 0939 Last data filed at 07/14/2018 2211 Gross per 24 hour  Intake 3 ml  Output -  Net 3 ml    Current Facility-Administered Medications:  .  0.9 %  sodium chloride infusion, 250 mL, Intravenous, Continuous, Newman Pies, MD, Stopped at 07/11/18 1541 .  acetaminophen (TYLENOL) tablet 650 mg, 650 mg, Oral, Q4H PRN **OR** acetaminophen (TYLENOL) suppository 650 mg, 650 mg, Rectal, Q4H PRN, Newman Pies, MD .  albuterol (PROVENTIL) (2.5 MG/3ML) 0.083% nebulizer solution 3 mL, 3 mL, Inhalation, Q6H PRN, Newman Pies, MD .  atorvastatin (LIPITOR) tablet 20 mg, 20 mg, Oral, q1800, Newman Pies, MD, 20 mg at 07/14/18 1743 .  bisacodyl (DULCOLAX) suppository 10 mg, 10 mg, Rectal, Daily PRN, Newman Pies, MD, 10 mg at 07/15/18  0742 .  ciprofloxacin (CIPRO) tablet 750 mg, 750 mg, Oral, BID, Newman Pies, MD, 750 mg at 07/15/18 0931 .  cyclobenzaprine (FLEXERIL) tablet 10 mg, 10 mg, Oral, TID PRN, Newman Pies, MD, 10 mg at 07/14/18 0840 .  docusate sodium (COLACE) capsule 200 mg, 200 mg, Oral, Daily, Newman Pies, MD, 200 mg at 07/15/18 0931 .  DULoxetine (CYMBALTA) DR capsule 60 mg, 60 mg, Oral, QPM, Newman Pies, MD, 60 mg at 07/14/18 1743 .  famotidine (PEPCID) tablet 40 mg, 40 mg, Oral, Daily, Newman Pies, MD, 40 mg at 07/15/18 0932 .  finasteride (PROSCAR) tablet 5 mg, 5 mg, Oral, QHS, Newman Pies, MD, 5 mg at 07/14/18 2211 .  fluticasone (FLONASE) 50 MCG/ACT nasal spray 2 spray, 2 spray, Each Nare, Daily PRN, Newman Pies, MD .  fluticasone furoate-vilanterol (BREO ELLIPTA) 100-25 MCG/INH 1 puff, 1 puff, Inhalation, Daily, Newman Pies, MD, 1 puff at 07/15/18 0926 .  folic acid (FOLVITE) tablet 1 mg, 1 mg, Oral, Daily, Newman Pies, MD, 1 mg at 07/15/18 0931 .  gabapentin (NEURONTIN) capsule 600 mg, 600 mg, Oral, TID, Newman Pies, MD, 600 mg at 07/15/18 0932 .  lactated ringers infusion, , Intravenous, Continuous, Belinda Block, MD, Stopped at 07/10/18 1630 .  losartan (COZAAR) tablet  50 mg, 50 mg, Oral, Daily, Newman Pies, MD, 50 mg at 07/15/18 0931 .  menthol-cetylpyridinium (CEPACOL) lozenge 3 mg, 1 lozenge, Oral, PRN **OR** phenol (CHLORASEPTIC) mouth spray 1 spray, 1 spray, Mouth/Throat, PRN, Newman Pies, MD .  metoprolol succinate (TOPROL-XL) 24 hr tablet 50 mg, 50 mg, Oral, QHS, Lelon Perla, MD, 50 mg at 07/14/18 2211 .  morphine 4 MG/ML injection 4 mg, 4 mg, Intravenous, Q2H PRN, Newman Pies, MD .  ondansetron [facility]) tablet 4 mg, 4 mg, Oral, Q6H PRN **OR** ondansetron (ZOFRAN) injection 4 mg, 4 mg, Intravenous, Q6H PRN, Newman Pies, MD, 4 mg at 07/15/18 0546 .  oxyCODONE (Oxy IR/ROXICODONE) immediate release tablet 10 mg, 10 mg, Oral,  Q3H PRN, Newman Pies, MD, 10 mg at 07/14/18 2218 .  oxyCODONE (Oxy IR/ROXICODONE) immediate release tablet 5 mg, 5 mg, Oral, Q3H PRN, Newman Pies, MD, 5 mg at 07/13/18 1121 .  predniSONE (DELTASONE) tablet 5 mg, 5 mg, Oral, Q breakfast, Newman Pies, MD, 5 mg at 07/15/18 0931 .  sodium chloride (OCEAN) 0.65 % nasal spray 1 spray, 1 spray, Each Nare, PRN, Newman Pies, MD .  sodium chloride flush (NS) 0.9 % injection 3 mL, 3 mL, Intravenous, Q12H, Newman Pies, MD, 3 mL at 07/15/18 0932 .  sodium chloride flush (NS) 0.9 % injection 3 mL, 3 mL, Intravenous, PRN, Newman Pies, MD .  tamsulosin [facility]) capsule 0.4 mg, 0.4 mg, Oral, QHS, Newman Pies, MD, 0.4 mg at 07/14/18 2211 .  traZODone (DESYREL) tablet 50 mg, 50 mg, Oral, QHS, Newman Pies, MD, 50 mg at 07/14/18 2211 .  umeclidinium bromide (INCRUSE ELLIPTA) 62.5 MCG/INH 1 puff, 1 puff, Inhalation, Daily, Newman Pies, MD, 1 puff at 07/15/18 0926   Physical Exam: AOx3, PERRL, EOMI, FS, TM,  Strength 5/5 x4  Assessment & Plan: 77 y.o. man s/p T9-L5 fusion, recovering well.  -cards: f/u echo, cards recs, cont metop,  -pulm: cont bronchodilator regimen -SCDs/TEDs/SQH  Judith Part  07/15/18 9:39 AM

## 2018-07-15 NOTE — Progress Notes (Signed)
Progress Note  Patient Name: [name] Date of Encounter: 07/15/2018  Primary Cardiologist: Stanford Breed  Subjective   Still with back pain having issues getting binder that fits   Inpatient Medications    Scheduled Meds: . atorvastatin  20 mg Oral q1800  . ciprofloxacin  750 mg Oral BID  . docusate sodium  200 mg Oral Daily  . DULoxetine  60 mg Oral QPM  . famotidine  40 mg Oral Daily  . finasteride  5 mg Oral QHS  . fluticasone furoate-vilanterol  1 puff Inhalation Daily  . folic acid  1 mg Oral Daily  . gabapentin  600 mg Oral TID  . heparin injection (subcutaneous)  5,000 Units Subcutaneous Q8H  . losartan  50 mg Oral Daily  . metoprolol succinate  50 mg Oral QHS  . predniSONE  5 mg Oral Q breakfast  . sodium chloride flush  3 mL Intravenous Q12H  . tamsulosin  0.4 mg Oral QHS  . traZODone  50 mg Oral QHS  . umeclidinium bromide  1 puff Inhalation Daily   Continuous Infusions: . sodium chloride Stopped (07/11/18 1541)  . lactated ringers Stopped (07/10/18 1630)   PRN Meds: acetaminophen **OR** acetaminophen, albuterol, bisacodyl, cyclobenzaprine, fluticasone, menthol-cetylpyridinium **OR** phenol, morphine injection, ondansetron **OR** ondansetron (ZOFRAN) IV, oxyCODONE, oxyCODONE, sodium chloride, sodium chloride flush   Vital Signs    Vitals:   07/15/18 0100 07/15/18 0300 07/15/18 0926 07/15/18 0936  BP:  132/69  122/68  Pulse: 73 (!) 112  93  Resp: (!) _0 Temp:  97.6 F (36.4 C)  98.1 F (36.7 C)  TempSrc:  Oral  Oral  SpO2: 97% 97% 98% 98%  Weight:      Height:        Intake/Output Summary (Last 24 hours) at 07/15/2018 1012 Last data filed at 07/14/2018 2211 Gross per 24 hour  Intake 3 ml  Output -  Net 3 ml   Filed Weights   07/10/18 0949  Weight: 104.3 kg    Telemetry    SR with bursts of PAT  - Personally Reviewed  ECG    SR multiform PACls nonspecific ST changes  - Personally Reviewed  Physical Exam  Obese white  male  GEN: No acute distress.   Neck: No JVD Cardiac: RRR, no murmurs, rubs, or gallops.  Respiratory: Clear to auscultation bilaterally. GI: Soft, nontender, non-distended  MS: No edema; No deformity. Neuro:  Nonfocal  Psych: Normal affect  Post lumbar surgery   Labs    Chemistry Recent Labs  Lab 07/11/18 0335 07/13/18 1858  NA 138 134*  K 4.9 3.7  CL 100 100  CO2 29 26  GLUCOSE 148* 120*  BUN 11 15  CREATININE 0.95 0.84  CALCIUM 8.5* 8.0*  GFRNONAA >60 >60  GFRAA >60 >60  ANIONGAP 9 8     Hematology Recent Labs  Lab 07/11/18 0335 07/13/18 1858  WBC 13.4* 14.7*  RBC 3.06* 2.78*  HGB 10.6* 9.8*  HCT 33.5* 30.7*  MCV 109.5* 110.4*  MCH 34.6* 35.3*  MCHC 31.6 31.9  RDW 15.7* 15.2  PLT 239 244    Cardiac Enzymes Recent Labs  Lab 07/13/18 1858 07/13/18 2219 07/14/18 0308  TROPONINI <0.03 <0.03 <0.03   No results for input(s): TROPIPOC in the last 168 hours.   BNP Recent Labs  Lab 07/13/18 1858  BNP 120.0*     DDimer No results for input(s): DDIMER in the last 168 hours.  Radiology    Dg Chest 2 View  Result Date: 07/13/2018 CLINICAL DATA:  History of recent back surgery, now with hypoxia EXAM: CHEST - 2 VIEW COMPARISON:  07/10/2018, 06/29/2018 radiograph and CT FINDINGS: Mild bibasilar atelectasis and fibrosis. No acute consolidation or pleural effusion. Enlarged cardiomediastinal silhouette with aortic atherosclerosis. No pneumothorax. Postsurgical changes in the lower thoracic spine. IMPRESSION: 1. Bibasilar atelectasis and scarring with tiny left effusion, not much interval change since 07/10/2018. 2. Cardiomegaly Electronically Signed   By: Donavan Foil M.D.   On: 07/13/2018 22:09    Cardiac Studies   TTE pending   Patient Profile     77 y.o. male with RA, prostate CA, HTN, HLD Reflux, COPD post back surgery with PAT  Assessment & Plan    PAT:   Clearly not PAF and no need for anticoagulation especially post spinal surgery. Will  change Lopressor to tid may need AAT in long run with outpatient event monitor and EP fu. Ok to get up  Needs to be OOB and continue with rehabPT.  TTE is pending       For questions or updates, please contact [place] Please consult [email or web address] for contact info under        Signed, Jenkins Rouge, MD  07/15/2018, 10:12 AM

## 2018-07-15 NOTE — Progress Notes (Signed)
Physical Therapy Treatment Patient Details Name: [name] MRN: [id number] DOB: [birth year] Today's Date: 07/15/2018    History of Present Illness The patient is a 77 year old white male who Dr. [name] performed an L2-3, L3-4 and L4-5 decompression, instrumentation and fusion.  He has developed recurrent back and leg pain. On 10/14 pt underwent L1-2 laminectomy and discectomy; exploration of lumbar fusion; posterior segmental instrumentation T9-L5. PMH: COPD, depression, RA, prostate cancer. PSH: previous back surgerys, L THA, hernia repair.    PT Comments    Patient seen for activity progression. Tolerated bed mobility and standing but became significantly orthostatic in standing. BP sitting 132/69 dropping to 87/56 in standing with Hr increased to upper 140s. Patient + dizziness, diaphoretic, and flush. Nursing notified. Patient returned to bed and repositioned. Will continue to attempt progression as toelrated.   Follow Up Recommendations  SNF     Equipment Recommendations  None recommended by PT    Recommendations for Other Services       Precautions / Restrictions Precautions Precautions: Back Precaution Booklet Issued: Yes (comment) Precaution Comments: pt able to recall 3/3 back precautions Required Braces or Orthoses: Spinal Brace Spinal Brace: Applied in sitting position;Thoracolumbosacral orthotic Restrictions Weight Bearing Restrictions: No    Mobility  Bed Mobility Overal bed mobility: Needs Assistance Bed Mobility: Rolling;Sidelying to Sit;Sit to Sidelying Rolling: Min guard Sidelying to sit: Min guard     Sit to sidelying: Min assist General bed mobility comments: Increased time and effort to perform, min assist to elevate LEs upon return to sidelying.  Transfers Overall transfer level: Needs assistance Equipment used: Rolling walker (2 wheeled) Transfers: Sit to/from Stand Sit to Stand: Min guard         General transfer comment: Min guard  for safety upon coming to standing  Ambulation/Gait Ambulation/Gait assistance: Min guard Gait Distance (Feet): 2 Feet Assistive device: Rolling walker (2 wheeled) Gait Pattern/deviations: Step-through pattern;Decreased stride length Gait velocity: slow   General Gait Details: HR elevation to 140s with significant orthostatic reaction +dizziness, diaphoresis, and elevated HE   Stairs             Wheelchair Mobility    Modified Rankin (Stroke Patients Only)       Balance Overall balance assessment: Needs assistance Sitting-balance support: Feet supported;Bilateral upper extremity supported Sitting balance-Leahy Scale: Fair     Standing balance support: Bilateral upper extremity supported Standing balance-Leahy Scale: Poor Standing balance comment: relaince on RW                            Cognition Arousal/Alertness: Awake/alert Behavior During Therapy: WFL for tasks assessed/performed Overall Cognitive Status: Impaired/Different from baseline Area of Impairment: Orientation;Problem solving                 Orientation Level: Disoriented to;Time           Problem Solving: Slow processing        Exercises      General Comments        Pertinent Vitals/Pain Pain Assessment: Faces Faces Pain Scale: Hurts even more Pain Location: left anterior thigh and back, 6 supine up to 9 in standing Pain Descriptors / Indicators: Discomfort;Operative site guarding;Aching;Sharp;Moaning;Grimacing;Guarding Pain Intervention(s): Monitored during session    Home Living                      Prior Function  PT Goals (current goals can now be found in the care plan section) Acute Rehab PT Goals Patient Stated Goal: "Go to rehab" PT Goal Formulation: With patient Time For Goal Achievement: 07/25/18 Potential to Achieve Goals: Good Progress towards PT goals: Not progressing toward goals - comment(limited by orthostatic BP  today)    Frequency    Min 5X/week      PT Plan Current plan remains appropriate    Co-evaluation              AM-PAC PT "6 Clicks" Daily Activity  Outcome Measure  Difficulty turning over in bed (including adjusting bedclothes, sheets and blankets)?: Unable Difficulty moving from lying on back to sitting on the side of the bed? : Unable Difficulty sitting down on and standing up from a chair with arms (e.g., wheelchair, bedside commode, etc,.)?: A Lot Help needed moving to and from a bed to chair (including a wheelchair)?: A Little Help needed walking in hospital room?: A Little Help needed climbing 3-5 steps with a railing? : A Lot 6 Click Score: 12    End of Session Equipment Utilized During Treatment: Gait belt;Back brace Activity Tolerance: Patient limited by pain;Treatment limited secondary to medical complications (Comment)(HR up to 170 with final transfer) Patient left: in bed;with call bell/phone within reach;with family/visitor present;with nursing/sitter in room Nurse Communication: Mobility status;Precautions PT Visit Diagnosis: Unsteadiness on feet (R26.81);Pain;Difficulty in walking, not elsewhere classified (R26.2) Pain - part of body: (back)     Time: 2162-4469 PT Time Calculation (min) (ACUTE ONLY): 25 min  Charges:  $Therapeutic Activity: 23-37 mins                     Alben Deeds, PT DPT  Board Certified Neurologic Specialist [place] Pager [phone] Office [place] 07/15/2018, 3:38 PM

## 2018-07-16 LAB — ECHOCARDIOGRAM COMPLETE
HEIGHTINCHES: 67 in
Weight: 3680 oz

## 2018-07-16 MED ORDER — FLECAINIDE ACETATE 50 MG PO TABS
75.0000 mg | ORAL_TABLET | Freq: Two times a day (BID) | ORAL | Status: DC
  Administered 2018-07-16 (×2): 75 mg via ORAL
  Filled 2018-07-16 (×3): qty 2

## 2018-07-16 NOTE — Progress Notes (Signed)
Neurosurgery Service Progress Note  Subjective: No acute events overnight, no palpitations / symptoms or orthostasis  Objective: Vitals:   07/16/18 0629 07/16/18 0805 07/16/18 0845 07/16/18 0951  BP: 101/62 97/60 (!) 89/54   Pulse: 82 89 83   Resp: 19 (!) 25 (!) 21   Temp:  97.7 F (36.5 C) 97.7 F (36.5 C)   TempSrc:  Oral    SpO2: 97% 93% 94% 95%  Weight:      Height:       Temp (24hrs), Avg:98.6 F (37 C), Min:97.7 F (36.5 C), Max:100.2 F (37.9 C)  CBC Latest Ref Rng & Units 07/13/2018 07/11/2018 07/03/2018  WBC 4.0 - 10.5 K/uL 14.7(H) 13.4(H) 8.2  Hemoglobin 13.0 - 17.0 g/dL 9.8(L) 10.6(L) 13.6  Hematocrit 39.0 - 52.0 % 30.7(L) 33.5(L) 41.9  Platelets 150 - 400 K/uL 244 239 271   BMP Latest Ref Rng & Units 07/13/2018 07/11/2018 07/03/2018  Glucose 70 - 99 mg/dL 120(H) 148(H) 100(H)  BUN 8 - 23 mg/dL _0 Creatinine 0.61 - 1.24 mg/dL 0.84 0.95 0.94  BUN/Creat Ratio 6 - 22 (calc) - - -  Sodium 135 - 145 mmol/L 134(L) 138 135  Potassium 3.5 - 5.1 mmol/L 3.7 4.9 4.0  Chloride 98 - 111 mmol/L 100 100 103  CO2 22 - 32 mmol/L _1 Calcium 8.9 - 10.3 mg/dL 8.0(L) 8.5(L) 8.7(L)    Intake/Output Summary (Last 24 hours) at 07/16/2018 1154 Last data filed at 07/16/2018 0800 Gross per 24 hour  Intake 524.03 ml  Output 1 ml  Net 523.03 ml    Current Facility-Administered Medications:  .  0.9 %  sodium chloride infusion, 250 mL, Intravenous, Continuous, Newman Pies, MD, Stopped at 07/11/18 1541 .  acetaminophen (TYLENOL) tablet 650 mg, 650 mg, Oral, Q4H PRN **OR** acetaminophen (TYLENOL) suppository 650 mg, 650 mg, Rectal, Q4H PRN, Newman Pies, MD .  albuterol (PROVENTIL) (2.5 MG/3ML) 0.083% nebulizer solution 3 mL, 3 mL, Inhalation, Q6H PRN, Newman Pies, MD .  atorvastatin (LIPITOR) tablet 20 mg, 20 mg, Oral, q1800, Newman Pies, MD, 20 mg at 07/15/18 1734 .  bisacodyl (DULCOLAX) suppository 10 mg, 10 mg, Rectal, Daily PRN, Newman Pies,  MD, 10 mg at 07/15/18 0742 .  ciprofloxacin (CIPRO) tablet 750 mg, 750 mg, Oral, BID, Newman Pies, MD, 750 mg at 07/16/18 0844 .  cyclobenzaprine (FLEXERIL) tablet 10 mg, 10 mg, Oral, TID PRN, Newman Pies, MD, 10 mg at 07/14/18 0840 .  docusate sodium (COLACE) capsule 200 mg, 200 mg, Oral, Daily, Newman Pies, MD, 200 mg at 07/16/18 0850 .  DULoxetine (CYMBALTA) DR capsule 60 mg, 60 mg, Oral, QPM, Newman Pies, MD, 60 mg at 07/15/18 1734 .  famotidine (PEPCID) tablet 40 mg, 40 mg, Oral, Daily, Newman Pies, MD, 40 mg at 07/16/18 0845 .  finasteride (PROSCAR) tablet 5 mg, 5 mg, Oral, QHS, Newman Pies, MD, 5 mg at 07/15/18 2245 .  flecainide (TAMBOCOR) tablet 75 mg, 75 mg, Oral, Q12H, Nishan, Peter C, MD .  fluticasone (FLONASE) 50 MCG/ACT nasal spray 2 spray, 2 spray, Each Nare, Daily PRN, Newman Pies, MD .  fluticasone furoate-vilanterol (BREO ELLIPTA) 100-25 MCG/INH 1 puff, 1 puff, Inhalation, Daily, Newman Pies, MD, 1 puff at 07/16/18 0951 .  folic acid (FOLVITE) tablet 1 mg, 1 mg, Oral, Daily, Newman Pies, MD, 1 mg at 07/16/18 0845 .  gabapentin (NEURONTIN) capsule 600 mg, 600 mg, Oral, TID, Newman Pies, MD, 600 mg at 07/16/18 0844 .  heparin  injection 5,000 Units, 5,000 Units, Subcutaneous, Q8H, Judith Part, MD, 5,000 Units at 07/16/18 [phone] .  lactated ringers infusion, , Intravenous, Continuous, Belinda Block, MD, Stopped at 07/10/18 1630 .  losartan (COZAAR) tablet 50 mg, 50 mg, Oral, Daily, Newman Pies, MD, 50 mg at 07/15/18 0931 .  menthol-cetylpyridinium (CEPACOL) lozenge 3 mg, 1 lozenge, Oral, PRN **OR** phenol (CHLORASEPTIC) mouth spray 1 spray, 1 spray, Mouth/Throat, PRN, Newman Pies, MD .  metoprolol tartrate (LOPRESSOR) tablet 50 mg, 50 mg, Oral, Q8H, Josue Hector, MD, 50 mg at 07/15/18 2246 .  morphine 4 MG/ML injection 4 mg, 4 mg, Intravenous, Q2H PRN, Newman Pies, MD .  ondansetron [facility]) tablet 4 mg, 4  mg, Oral, Q6H PRN **OR** ondansetron (ZOFRAN) injection 4 mg, 4 mg, Intravenous, Q6H PRN, Newman Pies, MD, 4 mg at 07/15/18 0546 .  oxyCODONE (Oxy IR/ROXICODONE) immediate release tablet 10 mg, 10 mg, Oral, Q3H PRN, Newman Pies, MD, 10 mg at 07/16/18 0355 .  oxyCODONE (Oxy IR/ROXICODONE) immediate release tablet 5 mg, 5 mg, Oral, Q3H PRN, Newman Pies, MD, 5 mg at 07/16/18 0848 .  predniSONE (DELTASONE) tablet 5 mg, 5 mg, Oral, Q breakfast, Newman Pies, MD, 5 mg at 07/16/18 0844 .  sodium chloride (OCEAN) 0.65 % nasal spray 1 spray, 1 spray, Each Nare, PRN, Newman Pies, MD .  sodium chloride flush (NS) 0.9 % injection 3 mL, 3 mL, Intravenous, Q12H, Newman Pies, MD, 3 mL at 07/16/18 0846 .  sodium chloride flush (NS) 0.9 % injection 3 mL, 3 mL, Intravenous, PRN, Newman Pies, MD .  tamsulosin [facility]) capsule 0.4 mg, 0.4 mg, Oral, QHS, Newman Pies, MD, 0.4 mg at 07/15/18 2245 .  traZODone (DESYREL) tablet 50 mg, 50 mg, Oral, QHS, Newman Pies, MD, 50 mg at 07/15/18 2245 .  umeclidinium bromide (INCRUSE ELLIPTA) 62.5 MCG/INH 1 puff, 1 puff, Inhalation, Daily, Newman Pies, MD, 1 puff at 07/16/18 0951   Physical Exam: AOx3, PERRL, EOMI, FS, TM Strength 5/5 x4  Assessment & Plan: 77 y.o. man s/p T9-L5 fusion, recovering well.  -cards to start flecainide, have EP evaluate tomorrow , echo yesterday EF 60-65% -SNF planning -SCDs/TEDs/SQH  Judith Part  07/16/18 11:54 AM

## 2018-07-16 NOTE — Progress Notes (Signed)
Progress Note  Patient Name: [name] Date of Encounter: 07/16/2018  Primary Cardiologist: Stanford Breed  Subjective   No cardiac symptoms has only been from bed to commode   Inpatient Medications    Scheduled Meds: . atorvastatin  20 mg Oral q1800  . ciprofloxacin  750 mg Oral BID  . docusate sodium  200 mg Oral Daily  . DULoxetine  60 mg Oral QPM  . famotidine  40 mg Oral Daily  . finasteride  5 mg Oral QHS  . fluticasone furoate-vilanterol  1 puff Inhalation Daily  . folic acid  1 mg Oral Daily  . gabapentin  600 mg Oral TID  . heparin injection (subcutaneous)  5,000 Units Subcutaneous Q8H  . losartan  50 mg Oral Daily  . metoprolol tartrate  50 mg Oral Q8H  . predniSONE  5 mg Oral Q breakfast  . sodium chloride flush  3 mL Intravenous Q12H  . tamsulosin  0.4 mg Oral QHS  . traZODone  50 mg Oral QHS  . umeclidinium bromide  1 puff Inhalation Daily   Continuous Infusions: . sodium chloride Stopped (07/11/18 1541)  . lactated ringers Stopped (07/10/18 1630)   PRN Meds: acetaminophen **OR** acetaminophen, albuterol, bisacodyl, cyclobenzaprine, fluticasone, menthol-cetylpyridinium **OR** phenol, morphine injection, ondansetron **OR** ondansetron (ZOFRAN) IV, oxyCODONE, oxyCODONE, sodium chloride, sodium chloride flush   Vital Signs    Vitals:   07/16/18 0629 07/16/18 0805 07/16/18 0845 07/16/18 0951  BP: 101/62 97/60 (!) 89/54   Pulse: 82 89 83   Resp: 19 (!) 25 (!) 21   Temp:  97.7 F (36.5 C) 97.7 F (36.5 C)   TempSrc:  Oral    SpO2: 97% 93% 94% 95%  Weight:      Height:        Intake/Output Summary (Last 24 hours) at 07/16/2018 1129 Last data filed at 07/16/2018 0800 Gross per 24 hour  Intake 524.03 ml  Output 1 ml  Net 523.03 ml   Filed Weights   07/10/18 0949  Weight: 104.3 kg    Telemetry    SR with bursts of PAT  - Personally Reviewed  ECG    SR multiform PACls nonspecific ST changes  - Personally Reviewed  Physical Exam  Obese  white male  GEN: No acute distress.   Neck: No JVD Cardiac: RRR, no murmurs, rubs, or gallops.  Respiratory: Clear to auscultation bilaterally. GI: Soft, nontender, non-distended  MS: No edema; No deformity. Neuro:  Nonfocal  Psych: Normal affect  Post lumbar surgery   Labs    Chemistry Recent Labs  Lab 07/11/18 0335 07/13/18 1858  NA 138 134*  K 4.9 3.7  CL 100 100  CO2 29 26  GLUCOSE 148* 120*  BUN 11 15  CREATININE 0.95 0.84  CALCIUM 8.5* 8.0*  GFRNONAA >60 >60  GFRAA >60 >60  ANIONGAP 9 8     Hematology Recent Labs  Lab 07/11/18 0335 07/13/18 1858  WBC 13.4* 14.7*  RBC 3.06* 2.78*  HGB 10.6* 9.8*  HCT 33.5* 30.7*  MCV 109.5* 110.4*  MCH 34.6* 35.3*  MCHC 31.6 31.9  RDW 15.7* 15.2  PLT 239 244    Cardiac Enzymes Recent Labs  Lab 07/13/18 1858 07/13/18 2219 07/14/18 0308  TROPONINI <0.03 <0.03 <0.03   No results for input(s): TROPIPOC in the last 168 hours.   BNP Recent Labs  Lab 07/13/18 1858  BNP 120.0*     DDimer No results for input(s): DDIMER in the last 168 hours.  Radiology    No results found.  Cardiac Studies   TTE pending   Patient Profile     77 y.o. male with RA, prostate CA, HTN, HLD Reflux, COPD post back surgery with PAT  Assessment & Plan    PAT:   Clearly not PAF and no need for anticoagulation especially post spinal surgery. HR a bit Better with tid lopressor but needs AAT TTE normal EF 60-65% no history of CAD will start Flecainide 75 bid have call into Dr Rayann Heman and will have EP see in am       For questions or updates, please contact [facility] Please consult [email or web address] for contact info under        Signed, Jenkins Rouge, MD  07/16/2018, 11:29 AM

## 2018-07-16 NOTE — Progress Notes (Signed)
Patient placed on CPAP and tolerating well. 2L Bleed in of O2 and no issues.

## 2018-07-17 MED ORDER — CYCLOBENZAPRINE HCL 10 MG PO TABS: 10.0000 mg | ORAL_TABLET | Freq: Three times a day (TID) | ORAL | 1 refills | Status: DC | PRN

## 2018-07-17 MED ORDER — METOPROLOL TARTRATE 50 MG PO TABS: 50.0000 mg | ORAL_TABLET | Freq: Two times a day (BID) | ORAL | Status: DC

## 2018-07-17 MED ORDER — OXYCODONE HCL 10 MG PO TABS: 10.0000 mg | ORAL_TABLET | ORAL | 0 refills | Status: DC | PRN

## 2018-07-17 NOTE — Progress Notes (Signed)
Subjective: The patient is alert and pleasant.  His wife is at the bedside.  Objective: Vital signs in last 24 hours: Temp:  [98 F (36.7 C)-99.5 F (37.5 C)] 99 F (37.2 C) (10/21 0900) Pulse Rate:  [89-128] 110 (10/21 0849) Resp:  [20-29] 21 (10/21 0300) BP: (85-115)/(49-73) 91/67 (10/21 0555) SpO2:  [92 %-97 %] 95 % (10/21 0907) Estimated body mass index is 36.02 kg/m as calculated from the following:   Height as of this encounter: _0  (1.702 m).   Weight as of this encounter: 104.3 kg.   Intake/Output from previous day: 10/20 0701 - 10/21 0700 In: 963 [P.O.:960; I.V.:3] Out: 1301 [Urine:1301] Intake/Output this shift: Total I/O In: 300 [P.O.:300] Out: -   Physical exam the patient is alert and pleasant.  His lower extremity strength is normal.  He is in no apparent distress.  Lab Results: No results for input(s): WBC, HGB, HCT, PLT in the last 72 hours. BMET No results for input(s): NA, K, CL, CO2, GLUCOSE, BUN, CREATININE, CALCIUM in the last 72 hours.  Studies/Results: No results found.  Assessment/Plan: Postop day #7; the patient seems stable for transfer to the rehab facility.  I will need to confirm that the bed is still available.  So I will discharge him today.  I have put a call into the Education officer, museum.  LOS: 7 days     Ophelia Charter 07/17/2018, 10:03 AM

## 2018-07-17 NOTE — Progress Notes (Signed)
Patient will DC to: [facility] Anticipated DC date: 07/17/18 Family notified: Spouse Transport by: Corey Harold   Per MD patient ready for DC to [facility]. RN, patient, patient's family, and facility notified of DC. Discharge Summary and FL2 sent to facility. RN to call report prior to discharge [phone]). DC packet on chart. Ambulance transport requested for patient.   CSW will sign off for now as social work intervention is no longer needed. Please consult [place] again if new needs arise.  Cedric Fishman, LCSW Clinical Social Worker [phone]

## 2018-07-17 NOTE — Care Management Important Message (Signed)
Important Message  Patient Details  Name: MILES LEYDA MRN: [id number] Date of Birth: [birth year]   Medicare Important Message Given:  Yes    [name] 07/17/2018, 3:14 PM

## 2018-07-17 NOTE — Progress Notes (Addendum)
Physical Therapy Treatment Patient Details Name: [name] MRN: [id number] DOB: 02/27/1941 Today's Date: 07/17/2018    History of Present Illness The patient is a 77 year old white male who Dr. [name] performed an L2-3, L3-4 and L4-5 decompression, instrumentation and fusion.  He has developed recurrent back and leg pain. On 10/14 pt underwent L1-2 laminectomy and discectomy; exploration of lumbar fusion; posterior segmental instrumentation T9-L5. PMH: COPD, depression, RA, prostate cancer. PSH: previous back surgerys, L THA, hernia repair.    PT Comments    Gerry sitting up in chair without brace. TLSO applied in sitting with increased ease with refitting of brace. Pt with improved HR during gait with majority of activity HR <120 with brief rises to 135 which were unsustained. SpO2 88-94% throughout on RA with cues for pursed lip breathing pt able to maintain sats >90%. Pt able to recall 2/3 precautions, wife present and assisting with chair follow. Continued improvement in mobility and HR this session.   BP sitting 91/55 (67) Standing 92/72 (79)  Follow Up Recommendations  SNF     Equipment Recommendations  None recommended by PT    Recommendations for Other Services       Precautions / Restrictions Precautions Precautions: Back Precaution Comments: pt able to recall 2/3 back precautions with education for no lifting Required Braces or Orthoses: Spinal Brace Spinal Brace: Applied in sitting position;Thoracolumbosacral orthotic    Mobility  Bed Mobility               General bed mobility comments: in chair without brace on arrival after bathing with NT  Transfers Overall transfer level: Needs assistance   Transfers: Sit to/from Stand Sit to Stand: Min guard         General transfer comment: cues for hand placement, scooting to edge, anterior translation and sequence, no physical assist x 2 trials  Ambulation/Gait Ambulation/Gait assistance: Min  guard Gait Distance (Feet): 50 Feet Assistive device: Rolling walker (2 wheeled) Gait Pattern/deviations: Step-through pattern;Decreased stride length   Gait velocity interpretation: <1.8 ft/sec, indicate of risk for recurrent falls General Gait Details: pt with tendency to bend knees and lean back during gait to attempt to correct left hip pain. Cues for posture, anterior translation and position in RW. Chair follow with pt able to walk 50' x 2 with seated rest. HR 102-112 with gait with brief spike to 135 with fatigue and sitting in chair which quickly dropped in 5 sec to 120. SpO2 88-92% on RA with rise in SpO2 with cues for pursed lip breathing   Stairs             Wheelchair Mobility    Modified Rankin (Stroke Patients Only)       Balance Overall balance assessment: Needs assistance Sitting-balance support: Feet supported;Bilateral upper extremity supported Sitting balance-Leahy Scale: Fair     Standing balance support: Bilateral upper extremity supported Standing balance-Leahy Scale: Poor                              Cognition Arousal/Alertness: Awake/alert Behavior During Therapy: WFL for tasks assessed/performed Overall Cognitive Status: Impaired/Different from baseline Area of Impairment: Memory                     Memory: Decreased short-term memory       Problem Solving: Slow processing        Exercises      General Comments  Pertinent Vitals/Pain Pain Score: 6  Pain Location: left hip and anterior thigh Pain Descriptors / Indicators: Aching;Cramping;Constant Pain Intervention(s): Limited activity within patient's tolerance;Repositioned;Monitored during session;Premedicated before session    Home Living                      Prior Function            PT Goals (current goals can now be found in the care plan section) Progress towards PT goals: Progressing toward goals    Frequency    Min  5X/week      PT Plan Current plan remains appropriate    Co-evaluation              AM-PAC PT "6 Clicks" Daily Activity  Outcome Measure  Difficulty turning over in bed (including adjusting bedclothes, sheets and blankets)?: Unable Difficulty moving from lying on back to sitting on the side of the bed? : Unable Difficulty sitting down on and standing up from a chair with arms (e.g., wheelchair, bedside commode, etc,.)?: A Little Help needed moving to and from a bed to chair (including a wheelchair)?: A Little Help needed walking in hospital room?: A Little Help needed climbing 3-5 steps with a railing? : A Lot 6 Click Score: 13    End of Session Equipment Utilized During Treatment: Gait belt;Back brace Activity Tolerance: Patient limited by pain Patient left: in chair;with call bell/phone within reach;with family/visitor present Nurse Communication: Mobility status;Precautions PT Visit Diagnosis: Unsteadiness on feet (R26.81);Pain;Difficulty in walking, not elsewhere classified (R26.2)     Time: 6754-4920 PT Time Calculation (min) (ACUTE ONLY): 26 min  Charges:  $Gait Training: 8-22 mins $Therapeutic Activity: 8-22 mins                     [place], PT Acute Rehabilitation Services Pager: [phone] Office: [place] 07/17/2018, 8:53 AM

## 2018-07-17 NOTE — Care Management Note (Signed)
Case Management Note  Patient Details  Name: [name] MRN: [id number] Date of Birth: 1940/11/06  Subjective/Objective:  The patient is a 77 year old white male who Dr. [name] performed an L2-3, L3-4 and L4-5 decompression, instrumentation and fusion.  PTA, pt resided at [facility].                  Action/Plan: PT/OT recommending SNF LOC at dc.    Expected Discharge Date:  07/17/18               Expected Discharge Plan:  Skilled Nursing Facility  In-House Referral:  Clinical Social Work  Discharge planning Services  CM Consult  Post Acute Care Choice:    Choice offered to:     DME Arranged:    DME Agency:     HH Arranged:    [place] Agency:     Status of Service:  Completed, signed off  If discussed at [employer], dates discussed:    Additional Comments:  07/17/18 J. Josafat Enrico, RN, BSN Pt medically stable for dc today.  Plan dc to SNF, per CSW arrangements.  Ella Bodo, RN 07/17/2018, 4:45 PM

## 2018-07-17 NOTE — Progress Notes (Addendum)
3:55pm-Paperwork faxed to [facility]. They are able to accept patient today.   12:11pm-CSW left [facility] a voicemail to see if patient is still able to discharge there today.   Percell Locus Rishawn Walck LCSW [phone]

## 2018-07-17 NOTE — Discharge Summary (Signed)
Physician Discharge Summary  Patient ID: PACE LAMADRID MRN: [id number] DOB/AGE: 1941/02/28 77 y.o.  Admit date: 07/10/2018 Discharge date: 07/17/2018  Admission Diagnoses:Lumbar herniated disc, lumbar stenosis, lumbago, lumbar radiculopathy  Discharge Diagnoses:  The same Active Problems:   Lumbar adjacent segment disease with spondylolisthesis   Discharged Condition: good  Hospital Course:  I performed a L1-2 laminectomy and diskectomy with instrumentation and fusion from T9-L5 on the patient on 07/10/2018.  The surgery went well.  The patient's postoperative course was remarkable for some tachycardia and atrial flutter.  We had cardiology see the patient.  They round echocardiogram and adjust his his medications.  The patient also had some shortness of breath.  I had pulmonary medicine see the patient.  They also made recommendations and adjust his medicines.  The patient was seen by PT and OT.  By 07/17/2018 the patient felt better and requested to be discharged to a skilled nursing facility for recuperation.  Arrangements were made.  I gave the patient, and his wife, oral and written discharge instructions.  All their questions were answered.   The patient was instructed to follow up with his pulmonologist and cardiologist as per their directions.  Consults: PT, OT, Critical Care Medicine, Cardiology Significant Diagnostic Studies: echocardiogram Treatments: thoracolumbar decompression, instrumentation, and fusion Discharge Exam: Blood pressure 91/67, pulse (!) 110, temperature 99 F (37.2 C), resp. rate (!) 21, height 5' 7" (1.702 m), weight 104.3 kg, SpO2 95 %.  the patient is alert and pleasant.  His lower extremity strength is normal.  He is in no apparent distress.  Disposition:   [facility] [place]  Discharge Instructions    Call MD for:  difficulty breathing, headache or visual disturbances   Complete by:  As directed    Call MD for:   extreme fatigue   Complete by:  As directed    Call MD for:  hives   Complete by:  As directed    Call MD for:  persistant dizziness or light-headedness   Complete by:  As directed    Call MD for:  persistant nausea and vomiting   Complete by:  As directed    Call MD for:  redness, tenderness, or signs of infection (pain, swelling, redness, odor or green/yellow discharge around incision site)   Complete by:  As directed    Call MD for:  severe uncontrolled pain   Complete by:  As directed    Call MD for:  temperature >100.4   Complete by:  As directed    Diet - low sodium heart healthy   Complete by:  As directed    Discharge instructions   Complete by:  As directed    Call [phone] for a followup appointment. Take a stool softener while you are using pain medications.   Driving Restrictions   Complete by:  As directed    Do not drive for 2 weeks.   Increase activity slowly   Complete by:  As directed    Lifting restrictions   Complete by:  As directed    Do not lift more than 5 pounds. No excessive bending or twisting.   May shower / Bathe   Complete by:  As directed    Remove the dressing for 3 days after surgery.  You may shower, but leave the incision alone.   No dressing needed   Complete by:  As directed      Allergies as of 07/17/2018  Reactions   Sulfa Antibiotics Shortness Of Breath   Sulfasalazine Shortness Of Breath   Lisinopril Cough   Varenicline Other (See Comments)   Strange thoughts      Medication List    STOP taking these medications   ibuprofen 200 MG tablet Commonly known as:  ADVIL,MOTRIN   methotrexate 2.5 MG tablet   sennosides-docusate sodium 8.6-50 MG tablet Commonly known as:  SENOKOT-S     TAKE these medications   albuterol 108 (90 Base) MCG/ACT inhaler Commonly known as:  PROVENTIL HFA;VENTOLIN HFA Inhale 2 puffs into the lungs every 6 (six) hours as needed for wheezing or shortness of breath.   BREO ELLIPTA 100-25  MCG/INH Aepb Generic drug:  fluticasone furoate-vilanterol INHALE 1 PUFF INTO THE LUNGS ONCE DAILY What changed:  See the new instructions.   ciprofloxacin 750 MG tablet Commonly known as:  CIPRO TAKE ONE TABLET BY MOUTH TWICE DAILY   cyclobenzaprine 10 MG tablet Commonly known as:  FLEXERIL Take 1 tablet (10 mg total) by mouth 3 (three) times daily as needed for muscle spasms.   docusate sodium 250 MG capsule Commonly known as:  COLACE Take 250-500 mg by mouth daily.   DULoxetine 60 MG capsule Commonly known as:  CYMBALTA Take 60 mg by mouth every evening.   famotidine 40 MG tablet Commonly known as:  PEPCID Take 1 tablet (40 mg total) by mouth daily.   finasteride 5 MG tablet Commonly known as:  PROSCAR Take 5 mg by mouth at bedtime.   fluconazole 100 MG tablet Commonly known as:  DIFLUCAN Take 1 tablet (100 mg total) by mouth daily.   fluticasone 50 MCG/ACT nasal spray Commonly known as:  FLONASE Place 2 sprays into both nostrils daily as needed for allergies.   folic acid 1 MG tablet Commonly known as:  FOLVITE Take 1 mg by mouth daily.   gabapentin 300 MG capsule Commonly known as:  NEURONTIN Take 600 mg by mouth 3 (three) times daily.   losartan 50 MG tablet Commonly known as:  COZAAR Take 1 tablet (50 mg total) by mouth daily.   metoprolol succinate 25 MG 24 hr tablet Commonly known as:  TOPROL-XL Take 25 mg by mouth at bedtime.   Oxycodone HCl 10 MG Tabs Take 1 tablet (10 mg total) by mouth every 4 (four) hours as needed for severe pain ((score 7 to 10)).   predniSONE 5 MG tablet Commonly known as:  DELTASONE Take 5 mg by mouth daily with breakfast.   simvastatin 40 MG tablet Commonly known as:  ZOCOR Take 40 mg by mouth at bedtime.   sodium chloride 0.65 % Soln nasal spray Commonly known as:  OCEAN Place 1 spray into both nostrils as needed for congestion.   tamsulosin 0.4 MG Caps capsule Commonly known as:  FLOMAX Take 0.4 mg by mouth at  bedtime.   Tiotropium Bromide Monohydrate 2.5 MCG/ACT Aers Inhale 2 puffs into the lungs daily.   traZODone 50 MG tablet Commonly known as:  DESYREL Take 50 mg by mouth at bedtime.       Contact information for follow-up providers    Lauraine Rinne, NP Follow up on 08/04/2018.   Specialty:  Pulmonary Disease Why:  2 pm with [name], NP Please arrive 15 minutes early Contact information: [address] [place] [address] [phone]        Juanito Doom, MD Follow up on 08/23/2018.   Specialty:  Pulmonary Disease Why:  10:15  am  Please arrive 15 minutes early Contact information: [address] [place] [address] [phone]        Patsey Berthold, NP Follow up on 07/28/2018.   Specialty:  Cardiology Why:  at [facility] information: [place] [address] [phone]            Contact information for after-discharge care    Destination    [facility] .   Service:  Skilled Nursing Contact information: [address] [place] [phone]                  Signed: Ophelia Charter 07/17/2018, 1:25 PM

## 2018-07-17 NOTE — Consult Note (Addendum)
ELECTROPHYSIOLOGY CONSULT NOTE    [name] MRN: [id number], DOB/AGE: Jul 09, 1941 77 y.o.  Admit date: 07/10/2018 Date of Consult: 07/17/2018  Primary Physician: Javier Glazier, MD Primary Cardiologist: Stanford Breed (new this admission) Electrophysiologist: Alaia Lordi (new this admission)  Patient Profile: [name] is a 77 y.o. male with a history of rheumatoid arthritis, prostate cancer, hypertension, hyperlipidemia, GERD, COPD who is being seen today for the evaluation of atrial tachycardia at the request of Dr Johnsie Cancel.  HPI:  [name] is a 77 y.o. male with the above past medical history. He was admitted for elective back surgery and developed shortness of breath post procedure as well as atrial tachycardia.  His BB has been uptitrated this admission.  He has continued with brief runs of atrial tachycardia. Flecainide was started yesterday and EP was asked to see today for further recommendations.  He has occasional nocturnal palpitations at home that self terminate.  He has not had sustained arrhythmias. He has OSA and is compliant with CPAP.    Echocardiogram this admission demonstrated EF 60-65%, LA 24.  He denies chest pain, dyspnea, PND, orthopnea, nausea, vomiting, dizziness, syncope, edema, weight gain, or early satiety.  Past Medical History:  Diagnosis Date  . Chronic lower back pain   . Complication of anesthesia 01/2017   was in ICU due to breathing complications-  . COPD (chronic obstructive pulmonary disease) ([place])   . Degenerative scoliosis in adult patient   . Depression   . Dyspnea    on exertion  . GERD (gastroesophageal reflux disease)   . H/O seasonal allergies   . High cholesterol   . 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 History of kidney stones   . Hypertension   . 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 OSA on CPAP    wears CPAP  . Pneumonia    "I've had it  several times; last time was end of Jan 2018" (05/02/2017)  . Pre-diabetes   . Prostate cancer ([place])   . Rheumatoid arteritis ([place])    "all over" (05/02/2017)  . Thrush 12/07/2017  . Wears glasses      Surgical History:  Past Surgical History:  Procedure Laterality Date  . BACK SURGERY    . COLONOSCOPY W/ POLYPECTOMY    . FRACTURE SURGERY    . HERNIA REPAIR    . HIP ARTHROPLASTY Left 04/2016  . INGUINAL HERNIA REPAIR Left 1986  . JOINT REPLACEMENT    . LUMBAR WOUND DEBRIDEMENT N/A 04/18/2017   Procedure: REPAIR OF LUMBAR PSEUDOMENINGOCELE;  Surgeon: Newman Pies, MD;  Location: [facility];  Service: Neurosurgery;  Laterality: N/A;  REAIR OF LUMBAR PSEUDOMENINGOCELE  . LUMBAR WOUND DEBRIDEMENT N/A 05/05/2017   Procedure: I&D Lumbar Wound;  Surgeon: Newman Pies, MD;  Location: [facility];  Service: Neurosurgery;  Laterality: N/A;  . MAXIMUM ACCESS (MAS)POSTERIOR LUMBAR INTERBODY FUSION (PLIF) 3 LEVEL  02/17/2017   [name] 02/17/2017  . OPEN SURGICAL REPAIR OF GLUTEAL TENDON Right 11/02/2017   Procedure: Right hip gluteal tendon repair;  Surgeon: Gaynelle Arabian, MD;  Location: [facility];  Service: Orthopedics;  Laterality: Right;  . POSTERIOR LAMINECTOMY / DECOMPRESSION LUMBAR SPINE  2010  . PROSTATE BIOPSY    . PROSTATE BIOPSY  <2013 X 3  . SPLENECTOMY  1955  . TONSILLECTOMY    . TOTAL HIP ARTHROPLASTY Right 10/2015  . UMBILICAL HERNIA REPAIR  05/2016     Medications  Prior to Admission  Medication Sig Dispense Refill Last Dose  . albuterol (PROVENTIL HFA;VENTOLIN HFA) 108 (90 Base) MCG/ACT inhaler Inhale 2 puffs into the lungs every 6 (six) hours as needed for wheezing or shortness of breath.   Past Week at Unknown time  . BREO ELLIPTA 100-25 MCG/INH AEPB INHALE 1 PUFF INTO THE LUNGS ONCE DAILY (Patient taking differently: Inhale 1 puff into the lungs daily. ) 180 each 1 07/10/2018 at 000  . ciprofloxacin (CIPRO) 750 MG tablet TAKE ONE TABLET BY MOUTH TWICE DAILY 180 tablet 0 07/09/2018 at  Unknown time  . docusate sodium (COLACE) 250 MG capsule Take 250-500 mg by mouth daily.   07/09/2018 at Unknown time  . DULoxetine (CYMBALTA) 60 MG capsule Take 60 mg by mouth every evening.    07/09/2018 at Unknown time  . finasteride (PROSCAR) 5 MG tablet Take 5 mg by mouth at bedtime.    07/09/2018 at Unknown time  . fluticasone (FLONASE) 50 MCG/ACT nasal spray Place 2 sprays into both nostrils daily as needed for allergies.    Past Week at Unknown time  . folic acid (FOLVITE) 1 MG tablet Take 1 mg by mouth daily.   07/09/2018 at Unknown time  . gabapentin (NEURONTIN) 300 MG capsule Take 600 mg by mouth 3 (three) times daily.   07/09/2018 at Unknown time  . ibuprofen (ADVIL,MOTRIN) 200 MG tablet Take 400 mg by mouth 3 (three) times daily.   Past Week at Unknown time  . losartan (COZAAR) 50 MG tablet Take 1 tablet (50 mg total) by mouth daily. 30 tablet 5 07/09/2018 at Unknown time  . methotrexate 2.5 MG tablet Take 25 mg by mouth every Saturday.    Past Week at Unknown time  . metoprolol succinate (TOPROL-XL) 25 MG 24 hr tablet Take 25 mg by mouth at bedtime.   07/09/2018 at 1830  . predniSONE (DELTASONE) 5 MG tablet Take 5 mg by mouth daily with breakfast.   07/10/2018 at 0700  . sennosides-docusate sodium (SENOKOT-S) 8.6-50 MG tablet Take 2 tablets by mouth at bedtime as needed (for constipation.).    Taking  . simvastatin (ZOCOR) 40 MG tablet Take 40 mg by mouth at bedtime.    07/09/2018 at Unknown time  . sodium chloride (OCEAN) 0.65 % SOLN nasal spray Place 1 spray into both nostrils as needed for congestion.   Past Week at Unknown time  . tamsulosin (FLOMAX) 0.4 MG CAPS capsule Take 0.4 mg by mouth at bedtime.    07/09/2018 at Unknown time  . Tiotropium Bromide Monohydrate (SPIRIVA RESPIMAT) 2.5 MCG/ACT AERS Inhale 2 puffs into the lungs daily. 3 Inhaler 3 07/10/2018 at 0700  . traZODone (DESYREL) 50 MG tablet Take 50 mg by mouth at bedtime.   07/09/2018 at Unknown time  . famotidine  (PEPCID) 40 MG tablet Take 1 tablet (40 mg total) by mouth daily. 30 tablet 6 Taking  . fluconazole (DIFLUCAN) 100 MG tablet Take 1 tablet (100 mg total) by mouth daily. 14 tablet 3 Taking    Inpatient Medications:  . atorvastatin  20 mg Oral q1800  . ciprofloxacin  750 mg Oral BID  . docusate sodium  200 mg Oral Daily  . DULoxetine  60 mg Oral QPM  . famotidine  40 mg Oral Daily  . finasteride  5 mg Oral QHS  . fluticasone furoate-vilanterol  1 puff Inhalation Daily  . folic acid  1 mg Oral Daily  . gabapentin  600 mg Oral TID  . heparin  injection (subcutaneous)  5,000 Units Subcutaneous Q8H  . metoprolol tartrate  50 mg Oral BID  . predniSONE  5 mg Oral Q breakfast  . sodium chloride flush  3 mL Intravenous Q12H  . tamsulosin  0.4 mg Oral QHS  . traZODone  50 mg Oral QHS  . umeclidinium bromide  1 puff Inhalation Daily    Allergies:  Allergies  Allergen Reactions  . Sulfa Antibiotics Shortness Of Breath  . Sulfasalazine Shortness Of Breath  . Lisinopril Cough  . Varenicline Other (See Comments)    Strange thoughts    Social History   Socioeconomic History  . Marital status: Married    Spouse name: Not on file  . Number of children: Not on file  . Years of education: Not on file  . Highest education level: Not on file  Occupational History  . Not on file  Social Needs  . Financial resource strain: Not on file  . Food insecurity:    Worry: Not on file    Inability: Not on file  . Transportation needs:    Medical: Not on file    Non-medical: Not on file  Tobacco Use  . Smoking status: Former Smoker    Packs/day: 1.50    Years: 54.00    Pack years: 81.00    Types: Cigarettes    Last attempt to quit: 09/27/2014    Years since quitting: 3.8  . Smokeless tobacco: Never Used  . Tobacco comment: 05/02/2017 "quit vaping 09/2016"  Substance and Sexual Activity  . Alcohol use: Yes    Alcohol/week: 17.0 standard drinks    Types: 7 Cans of beer, 10 Shots of liquor per  week    Comment: Daily shots, beer etc  . Drug use: No  . Sexual activity: Not Currently  Lifestyle  . Physical activity:    Days per week: Not on file    Minutes per session: Not on file  . Stress: Not on file  Relationships  . Social connections:    Talks on phone: Not on file    Gets together: Not on file    Attends religious service: Not on file    Active member of club or organization: Not on file    Attends meetings of clubs or organizations: Not on file    Relationship status: Not on file  . Intimate partner violence:    Fear of current or ex partner: Not on file    Emotionally abused: Not on file    Physically abused: Not on file    Forced sexual activity: Not on file  Other Topics Concern  . Not on file  Social History Narrative  . Not on file     Family History  Problem Relation Age of Onset  . Cancer Mother   . Heart disease Father   . Stroke Father      Review of Systems: All other systems reviewed and are otherwise negative except as noted above.  Physical Exam: Vitals:   07/17/18 0700 07/17/18 0849 07/17/18 0900 07/17/18 0907  BP:      Pulse:  (!) 110    Resp:      Temp: 99 F (37.2 C)  99 F (37.2 C)   TempSrc:      SpO2:  92%  95%  Weight:      Height:        GEN- The patient is well appearing, alert and oriented x 3 today.   HEENT: normocephalic, atraumatic; sclera clear,  conjunctiva pink; hearing intact; oropharynx clear; neck supple Lungs- Clear to ausculation bilaterally, normal work of breathing.  No wheezes, rales, rhonchi, +back brace Heart- Regular rate and rhythm, no murmurs, rubs or gallops GI- soft, non-tender, non-distended, bowel sounds present Extremities- no clubbing, cyanosis, or edema  MS- no significant deformity or atrophy Skin- warm and dry, no rash or lesion Psych- euthymic mood, full affect Neuro- strength and sensation are intact  Labs:   Lab Results  Component Value Date   WBC 14.7 (H) 07/13/2018   HGB 9.8  (L) 07/13/2018   HCT 30.7 (L) 07/13/2018   MCV 110.4 (H) 07/13/2018   PLT 244 07/13/2018    Recent Labs  Lab 07/13/18 1858  NA 134*  K 3.7  CL 100  CO2 26  BUN 15  CREATININE 0.84  CALCIUM 8.0*  GLUCOSE 120*      Radiology/Studies: Dg Chest 2 View  Result Date: 07/13/2018 CLINICAL DATA:  History of recent back surgery, now with hypoxia EXAM: CHEST - 2 VIEW COMPARISON:  07/10/2018, 06/29/2018 radiograph and CT FINDINGS: Mild bibasilar atelectasis and fibrosis. No acute consolidation or pleural effusion. Enlarged cardiomediastinal silhouette with aortic atherosclerosis. No pneumothorax. Postsurgical changes in the lower thoracic spine. IMPRESSION: 1. Bibasilar atelectasis and scarring with tiny left effusion, not much interval change since 07/10/2018. 2. Cardiomegaly Electronically Signed   By: Donavan Foil M.D.   On: 07/13/2018 22:09   Dg Lumbar Spine 2-3 Views  Result Date: 07/10/2018 CLINICAL DATA:  T9-L5 fusion EXAM: LUMBAR SPINE - 2-3 VIEW; DG C-ARM 61-120 MIN COMPARISON:  Chest CT-06/29/2018 FINDINGS: Four spot intraoperative fluoroscopic images of the lower thoracic and lumbar spine are provided for review. Spinal labeling is difficult given the coned field of view and non definitive visualization of the lumbosacral junction. Provided images demonstrate sequela paraspinal fusion at least 2 level intervertebral disc space replacement. Provided images demonstrate anatomic alignment. Additional surgical support apparatus is seen posterior to the operative site. No definite radiopaque foreign body. IMPRESSION: Post long segment thoracolumbar paraspinal fusion and intervertebral disc space replacement. Electronically Signed   By: Sandi Mariscal M.D.   On: 07/10/2018 16:27   Dg Lumbar Spine 1 View  Result Date: 07/10/2018 CLINICAL DATA:  Elective surgery.  Localization of L1-2 PLIF. EXAM: LUMBAR SPINE - 1 VIEW COMPARISON:  MRI of the lumbar spine 05/18/2018 FINDINGS: Intraoperative  imaging of lumbar spine demonstrates a surgical probe at the L1-2 disc level. IMPRESSION: Intraoperative localization of L1-2. Electronically Signed   By: San Morelle M.D.   On: 07/10/2018 16:48   Ct Chest High Resolution  Result Date: 06/29/2018 CLINICAL DATA:  Dyspnea on exertion and cough for several months. Rales on exam. Former smoker. EXAM: CT CHEST WITHOUT CONTRAST TECHNIQUE: Multidetector CT imaging of the chest was performed following the standard protocol without intravenous contrast. High resolution imaging of the lungs, as well as inspiratory and expiratory imaging, was performed. COMPARISON:  05/30/2018 chest radiograph. 12/19/2017 screening chest CT. FINDINGS: Cardiovascular: Top-normal heart size. No significant pericardial effusion/thickening. Three-vessel coronary atherosclerosis. Atherosclerotic nonaneurysmal thoracic aorta. Stable dilated main pulmonary artery (3.6 cm diameter). Mediastinum/Nodes: No discrete thyroid nodules. Unremarkable esophagus. No pathologically enlarged axillary, mediastinal or hilar lymph nodes, noting limited sensitivity for the detection of hilar adenopathy on this noncontrast study. Nonenlarged coarsely calcified AP window, subcarinal and left hilar nodes from prior granulomatous disease, unchanged. Lungs/Pleura: No pneumothorax. No pleural effusion. No acute consolidative airspace disease or lung masses. Stable coarsely calcified granulomas scattered in the left lung.  Peripheral left upper lobe 5 mm solid pulmonary nodule (series 5/image 89), stable. No new significant pulmonary nodules. There is patchy subpleural reticulation and ground-glass attenuation in both lungs with associated mild traction bronchiectasis, subpleural lines and mild architectural distortion. These findings asymmetrically involve the right lung. There is a mild basilar gradient to these findings. No frank honeycombing. These findings are new since 07/16/2005 chest CT, appears slightly  worse compared to 12/16/2016 screening chest CT and are not appreciably changed from 12/19/2017 screening chest CT. No significant lobular air trapping on the expiration sequence. Mild-to-moderate centrilobular and paraseptal emphysema with diffuse bronchial wall thickening. Upper abdomen: Scattered granulomatous liver and splenic calcifications. Musculoskeletal: No aggressive appearing focal osseous lesions. Marked thoracic spondylosis. Partial visualization of fixation hardware in the lumbar spine. IMPRESSION: 1. 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 Findings asymmetrically involve the right lung. Findings are new since 2006 chest CT, minimally progressed since 2008 chest CT and stable from most recent 12/19/2017 screening chest CT. 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 2. Left upper lobe 5 mm solid pulmonary nodule is stable since 03/25 screening chest CT. Please see the 12/19/2017 screening chest CT report for follow-up recommendations. 3. Mild-to-moderate centrilobular and paraseptal emphysema with diffuse bronchial wall thickening, suggesting COPD. 4. Three-vessel coronary atherosclerosis. 5. Stable dilated main pulmonary artery, suggesting chronic pulmonary arterial hypertension. Aortic Atherosclerosis (ICD10-I70.0) and Emphysema (ICD10-J43.9). Electronically Signed   By: Ilona Sorrel M.D.   On: 06/29/2018 12:09   Dg Chest Port 1 View  Result Date: 07/10/2018 CLINICAL DATA:  Shortness of breath postoperatively. EXAM: PORTABLE CHEST 1 VIEW COMPARISON:  05/30/2018 FINDINGS: Interval spinal fusion. Worsened atelectasis in the left lower lobe with a small left effusion. Mild chronic scarring at the right lung base. Upper lungs are clear. IMPRESSION: Worsened atelectasis at the left base with a small left effusion. Electronically Signed   By: Nelson Chimes  M.D.   On: 07/10/2018 19:34   Dg C-arm 1-60 Min  Result Date: 07/10/2018 CLINICAL DATA:  T9-L5 fusion EXAM: LUMBAR SPINE - 2-3 VIEW; DG C-ARM 61-120 MIN COMPARISON:  Chest CT-06/29/2018 FINDINGS: Four spot intraoperative fluoroscopic images of the lower thoracic and lumbar spine are provided for review. Spinal labeling is difficult given the coned field of view and non definitive visualization of the lumbosacral junction. Provided images demonstrate sequela paraspinal fusion at least 2 level intervertebral disc space replacement. Provided images demonstrate anatomic alignment. Additional surgical support apparatus is seen posterior to the operative site. No definite radiopaque foreign body. IMPRESSION: Post long segment thoracolumbar paraspinal fusion and intervertebral disc space replacement. Electronically Signed   By: Sandi Mariscal M.D.   On: 07/10/2018 16:27    ZOX:[id number] rhythm, brief run of AT (personally reviewed)  TELEMETRY: SR with brief runs of AT, PAC's (personally reviewed)  Assessment/Plan: 1.  Atrial tachycardia Relatively asymptomatic Would avoid AAD therapy at this time with few symptoms Continue BB - doses have been held 2/2 hypotension, will decrease Metoprolol to 90m twice daily  2.  HTN Will stop Losartan to allow for up-titration of BB if needed  3.  Post back surgery Per neurosurgery  4.  OSA Compliance with CPAP recommended   Ok to discharge home from our standpoint. Will arrange outpatient EP follow up in a couple of weeks.    [facility] will sign off.   Medication Recommendations:  Stop flecainide, stop losartan, metoprolol 520m  twice daily Other recommendations (labs, testing, etc):  none Follow up as an outpatient:  With EP in 2 weeks (scheduled and entered in [facility])  For questions or updates, please contact [facility] Please consult [email or web address] for contact info under Cardiology/STEMI.  Signed, Chanetta Marshall 07/17/2018 9:14 AM   I have seen,  examined the patient, and reviewed the above assessment and plan.  Changes to above are made where necessary.  On exam, RRR. Pt with episodes of atach in the setting of surgery.  Would continue metoprolol.  Currently, I do not think that flecainide is required.  Follow-up with EP in the office in 2 weeks for additional evaluation.  Co Sign: Thompson Grayer, MD

## 2018-07-19 NOTE — Progress Notes (Signed)
Electrophysiology Office Note Date: 07/28/2018  ID:  [name], DOB 02-23-1941, MRN [id number]  PCP: Javier Glazier, MD Primary Cardiologist: Stanford Breed Electrophysiologist: Allred  CC: AT follow up  [name] is a 77 y.o. male seen today for Dr Rayann Heman.  He was recently admitted for back surgery and developed atrial tachycardia post op. He was initially started on Flecainide but because his AT was relatively asymptomatic, Flecainide was discontinued and BB was continued. He presents today for follow up.  Since discharge, the patient reports doing relatively well.  He is going back home from rehab tomorrow.  A lot of his medications have been substituted while in rehab. He notes some hypotension with associated fatigue and orthostasis.  He denies chest pain, dyspnea, PND, orthopnea, nausea, vomiting, syncope, edema, weight gain, or early satiety.  Past Medical History:  Diagnosis Date  . Chronic lower back pain   . Complication of anesthesia 01/2017   was in ICU due to breathing complications-  . COPD (chronic obstructive pulmonary disease) ([place])   . Degenerative scoliosis in adult patient   . Depression   . Dyspnea    on exertion  . GERD (gastroesophageal reflux disease)   . H/O seasonal allergies   . High cholesterol   . 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 History of kidney stones   . Hypertension   . 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 OSA on CPAP    wears CPAP  . Pneumonia    "I've had it several times; last time was end of Jan 2018" (05/02/2017)  . Pre-diabetes   . Prostate cancer ([place])   . Rheumatoid arteritis ([place])    "all over" (05/02/2017)  . Thrush 12/07/2017  . Wears glasses    Past Surgical History:  Procedure Laterality Date  . BACK SURGERY    . COLONOSCOPY W/ POLYPECTOMY    . FRACTURE SURGERY    . HERNIA REPAIR    . HIP ARTHROPLASTY Left 04/2016  . INGUINAL HERNIA REPAIR  Left 1986  . JOINT REPLACEMENT    . LUMBAR WOUND DEBRIDEMENT N/A 04/18/2017   Procedure: REPAIR OF LUMBAR PSEUDOMENINGOCELE;  Surgeon: Newman Pies, MD;  Location: [facility];  Service: Neurosurgery;  Laterality: N/A;  REAIR OF LUMBAR PSEUDOMENINGOCELE  . LUMBAR WOUND DEBRIDEMENT N/A 05/05/2017   Procedure: I&D Lumbar Wound;  Surgeon: Newman Pies, MD;  Location: [facility];  Service: Neurosurgery;  Laterality: N/A;  . MAXIMUM ACCESS (MAS)POSTERIOR LUMBAR INTERBODY FUSION (PLIF) 3 LEVEL  02/17/2017   [name] 02/17/2017  . OPEN SURGICAL REPAIR OF GLUTEAL TENDON Right 11/02/2017   Procedure: Right hip gluteal tendon repair;  Surgeon: Gaynelle Arabian, MD;  Location: [facility];  Service: Orthopedics;  Laterality: Right;  . POSTERIOR LAMINECTOMY / DECOMPRESSION LUMBAR SPINE  2010  . PROSTATE BIOPSY    . PROSTATE BIOPSY  <2013 X 3  . SPLENECTOMY  1955  . TONSILLECTOMY    . TOTAL HIP ARTHROPLASTY Right 10/2015  . UMBILICAL HERNIA REPAIR  05/2016    Current Outpatient Medications  Medication Sig Dispense Refill  . albuterol (PROVENTIL HFA;VENTOLIN HFA) 108 (90 Base) MCG/ACT inhaler Inhale 2 puffs into the lungs every 6 (six) hours as needed for wheezing or shortness of breath.    . ciprofloxacin (CIPRO) 750 MG tablet TAKE ONE TABLET BY MOUTH TWICE DAILY 180 tablet 0  . DULoxetine (CYMBALTA) 60 MG capsule Take 60 mg by mouth every evening.     Marland Kitchen  famotidine (PEPCID) 40 MG tablet Take 1 tablet (40 mg total) by mouth daily. 30 tablet 6  . finasteride (PROSCAR) 5 MG tablet Take 5 mg by mouth at bedtime.     . fluticasone (FLONASE) 50 MCG/ACT nasal spray Place 2 sprays into both nostrils daily as needed for allergies.     . folic acid (FOLVITE) 1 MG tablet Take 1 mg by mouth daily.    Marland Kitchen gabapentin (NEURONTIN) 300 MG capsule Take 600 mg by mouth 3 (three) times daily.    Marland Kitchen HYDROcodone-acetaminophen (NORCO/VICODIN) 5-325 MG tablet Take 1 tablet by mouth every 4 (four) hours as needed for moderate pain.    Marland Kitchen insulin  aspart (NOVOLOG) 100 UNIT/ML injection Inject 2-8 Units into the skin as directed.    . insulin detemir (LEVEMIR) 100 UNIT/ML injection Inject 10 Units into the skin 2 (two) times daily.    . Lacosamide (VIMPAT) 100 MG TABS Take 1 tablet by mouth 2 (two) times daily.    Marland Kitchen levETIRAcetam (KEPPRA) 1000 MG tablet Take 1,000 mg by mouth 2 (two) times daily.    Marland Kitchen oxyCODONE 10 MG TABS Take 1 tablet (10 mg total) by mouth every 4 (four) hours as needed for severe pain ((score 7 to 10)). 30 tablet 0  . pantoprazole (PROTONIX) 40 MG tablet Take 40 mg by mouth daily.    . predniSONE (DELTASONE) 5 MG tablet Take 5 mg by mouth daily with breakfast.    . QUEtiapine (SEROQUEL) 25 MG tablet Take 12.5 mg by mouth at bedtime.    . simvastatin (ZOCOR) 40 MG tablet Take 40 mg by mouth at bedtime.     . sodium chloride (OCEAN) 0.65 % SOLN nasal spray Place 1 spray into both nostrils as needed for congestion.    . tamsulosin (FLOMAX) 0.4 MG CAPS capsule Take 0.4 mg by mouth at bedtime.     . Tiotropium Bromide Monohydrate (SPIRIVA RESPIMAT) 2.5 MCG/ACT AERS Inhale 2 puffs into the lungs daily. 3 Inhaler 3  . traZODone (DESYREL) 50 MG tablet Take 50 mg by mouth at bedtime.    Marland Kitchen losartan (COZAAR) 25 MG tablet Take 1 tablet (25 mg total) by mouth daily. 90 tablet 3  . metoprolol succinate (TOPROL-XL) 25 MG 24 hr tablet Take 1 tablet (25 mg total) by mouth 2 (two) times daily. Take with or immediately following a meal. 180 tablet 3   No current facility-administered medications for this visit.     Allergies:   Sulfa antibiotics; Sulfasalazine; Lisinopril; and Varenicline   Social History: Social History   Socioeconomic History  . Marital status: Married    Spouse name: Not on file  . Number of children: Not on file  . Years of education: Not on file  . Highest education level: Not on file  Occupational History  . Not on file  Social Needs  . Financial resource strain: Not on file  . Food insecurity:     Worry: Not on file    Inability: Not on file  . Transportation needs:    Medical: Not on file    Non-medical: Not on file  Tobacco Use  . Smoking status: Former Smoker    Packs/day: 1.50    Years: 54.00    Pack years: 81.00    Types: Cigarettes    Last attempt to quit: 09/27/2014    Years since quitting: 3.8  . Smokeless tobacco: Never Used  . Tobacco comment: 05/02/2017 "quit vaping 09/2016"  Substance and Sexual Activity  .  Alcohol use: Yes    Alcohol/week: 17.0 standard drinks    Types: 7 Cans of beer, 10 Shots of liquor per week    Comment: Daily shots, beer etc  . Drug use: No  . Sexual activity: Not Currently  Lifestyle  . Physical activity:    Days per week: Not on file    Minutes per session: Not on file  . Stress: Not on file  Relationships  . Social connections:    Talks on phone: Not on file    Gets together: Not on file    Attends religious service: Not on file    Active member of club or organization: Not on file    Attends meetings of clubs or organizations: Not on file    Relationship status: Not on file  . Intimate partner violence:    Fear of current or ex partner: Not on file    Emotionally abused: Not on file    Physically abused: Not on file    Forced sexual activity: Not on file  Other Topics Concern  . Not on file  Social History Narrative  . Not on file    Family History: Family History  Problem Relation Age of Onset  . Cancer Mother   . Heart disease Father   . Stroke Father     Review of Systems: All other systems reviewed and are otherwise negative except as noted above.   Physical Exam: VS:  BP 114/72   Pulse 78   Ht _0  (1.702 m)   Wt 232 lb (105.2 kg)   SpO2 98%   BMI 36.34 kg/m  , BMI Body mass index is 36.34 kg/m. Wt Readings from Last 3 Encounters:  07/28/18 232 lb (105.2 kg)  07/10/18 230 lb (104.3 kg)  07/04/18 230 lb (104.3 kg)    GEN- The patient is well appearing, alert and oriented x 3 today.   HEENT:  normocephalic, atraumatic; sclera clear, conjunctiva pink; hearing intact; oropharynx clear; neck supple  Lungs- Clear to ausculation bilaterally, normal work of breathing.  No wheezes, rales, rhonchi, +back brace Heart- Regular rate and rhythm  GI- soft, non-tender, non-distended, bowel sounds present  Extremities- no clubbing, cyanosis, 2+ BLE edema MS- no significant deformity or atrophy Skin- warm and dry, no rash or lesion  Psych- euthymic mood, full affect Neuro- strength and sensation are intact   EKG:  EKG is not ordered today.  Recent Labs: 07/03/2018: ALT 21 07/13/2018: B Natriuretic Peptide 120.0; BUN 15; Creatinine, Ser 0.84; Hemoglobin 9.8; Platelets 244; Potassium 3.7; Sodium 134    Other studies Reviewed: Additional studies/ records that were reviewed today include: hospital records  Assessment and Plan:  1.  Atrial tachycardia Continue BB Asymptomatic Decrease Metoprolol to 48m twice daily. If HR controlled and no palpitations in 6 weeks, ok to decrease to 276mat bedtime (prior home dose)  2.  HTN Stop Amlodipine and resume Losartan at home He has been having increased LE edema - this may help   3.  OSA Compliance with CPAP recommended   Current medicines are reviewed at length with the patient today.   The patient does not have concerns regarding his medicines.  The following changes were made today:  none  Labs/ tests ordered today include: none No orders of the defined types were placed in this encounter.    Disposition:   Follow up with me in 3 months    Signed, AmChanetta MarshallNP 07/28/2018 12:37 PM   [facility]  [address] [place] [address] [phone] (office) [phone] (fax)

## 2018-07-28 ENCOUNTER — Ambulatory Visit: Payer: Medicare Other | Admitting: Nurse Practitioner

## 2018-07-28 ENCOUNTER — Telehealth: Payer: Self-pay

## 2018-07-28 ENCOUNTER — Encounter: Payer: Self-pay | Admitting: Nurse Practitioner

## 2018-07-28 VITALS — BP 114/72 | HR 78 | Ht 67.0 in | Wt 232.0 lb

## 2018-07-28 DIAGNOSIS — Z9989 Dependence on other enabling machines and devices: Secondary | ICD-10-CM

## 2018-07-28 DIAGNOSIS — G4733 Obstructive sleep apnea (adult) (pediatric): Secondary | ICD-10-CM | POA: Diagnosis not present

## 2018-07-28 DIAGNOSIS — I4719 Other supraventricular tachycardia: Secondary | ICD-10-CM

## 2018-07-28 DIAGNOSIS — I471 Supraventricular tachycardia: Secondary | ICD-10-CM

## 2018-07-28 DIAGNOSIS — I10 Essential (primary) hypertension: Secondary | ICD-10-CM

## 2018-07-28 MED ORDER — METOPROLOL SUCCINATE ER 25 MG PO TB24: 25.0000 mg | ORAL_TABLET | Freq: Two times a day (BID) | ORAL | 3 refills | Status: DC

## 2018-07-28 MED ORDER — LOSARTAN POTASSIUM 25 MG PO TABS: 25.0000 mg | ORAL_TABLET | Freq: Every day | ORAL | 3 refills | Status: DC

## 2018-07-28 NOTE — Patient Instructions (Signed)
Medication Instructions:  START Losartan 50 mg daily DECREASE Metoprolol Succinate to 25 mg twice per day.  6 weeks from now if Blood Pressure is good, DECREASE to once per day If you need a refill on your cardiac medications before your next appointment, please call your pharmacy.   Lab work: none If you have labs (blood work) drawn today and your tests are completely normal, you will receive your results only by: Marland Kitchen MyChart Message (if you have MyChart) OR . A paper copy in the mail If you have any lab test that is abnormal or we need to change your treatment, we will call you to review the results.  Testing/Procedures: none  Follow-Up: At [facility], you and your health needs are our priority.  As part of our continuing mission to provide you with exceptional heart care, we have created designated Provider Care Teams.  These Care Teams include your primary Cardiologist (physician) and Advanced Practice Providers (APPs -  Physician Assistants and Nurse Practitioners) who all work together to provide you with the care you need, when you need it. You will need a follow up appointment in 3 months.  Please call our office 2 months in advance to schedule this appointment.  You may see Chanetta Marshall or one of the following Engineer, manufacturing Providers on your designated Care Team:   Chanetta Marshall, NP . Tommye Standard, PA-C  Any Other Special Instructions Will Be Listed Below (If Applicable).

## 2018-07-28 NOTE — Telephone Encounter (Signed)
Left patient a message informing him that he can come in at 11:00 for appt.

## 2018-08-04 ENCOUNTER — Encounter: Payer: Self-pay | Admitting: Pulmonary Disease

## 2018-08-04 ENCOUNTER — Ambulatory Visit: Payer: Medicare Other | Admitting: Pulmonary Disease

## 2018-08-04 DIAGNOSIS — J849 Interstitial pulmonary disease, unspecified: Secondary | ICD-10-CM

## 2018-08-04 DIAGNOSIS — T464X5A Adverse effect of angiotensin-converting-enzyme inhibitors, initial encounter: Secondary | ICD-10-CM

## 2018-08-04 DIAGNOSIS — R05 Cough: Secondary | ICD-10-CM

## 2018-08-04 DIAGNOSIS — G4733 Obstructive sleep apnea (adult) (pediatric): Secondary | ICD-10-CM

## 2018-08-04 DIAGNOSIS — J42 Unspecified chronic bronchitis: Secondary | ICD-10-CM | POA: Diagnosis not present

## 2018-08-04 DIAGNOSIS — R058 Other specified cough: Secondary | ICD-10-CM

## 2018-08-04 DIAGNOSIS — Z9989 Dependence on other enabling machines and devices: Secondary | ICD-10-CM

## 2018-08-04 IMAGING — CT CT CHEST LUNG CANCER SCREENING LOW DOSE W/O CM
2 of 4 series · 15 of 40 positions shown, 18 images · non-contrast
Comparison: 12/16/2016 screening chest CT.

CLINICAL DATA: 76-year-old asymptomatic male former smoker with 81
pack-year smoking history.

EXAM:
CT CHEST WITHOUT CONTRAST LOW-DOSE FOR LUNG CANCER SCREENING
TECHNIQUE: Multidetector CT imaging of the chest was performed following the
standard protocol without IV contrast.

[Series 2: axial st · axial · 0.81mm/px · z∈[-278,-28]mm · 12 of 60 slices shown, 15 images]
[im 5/60  mediastinal]
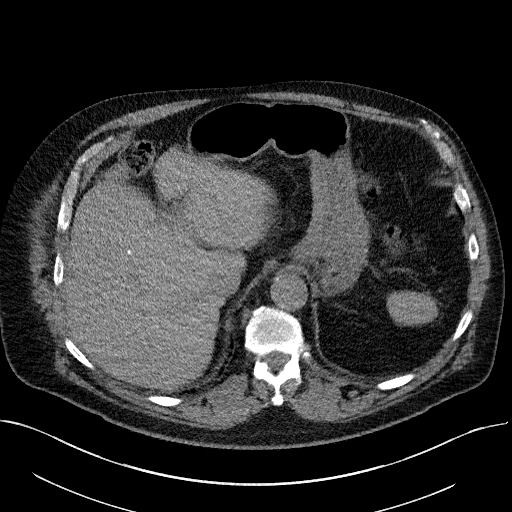
[im 5/60  lung]
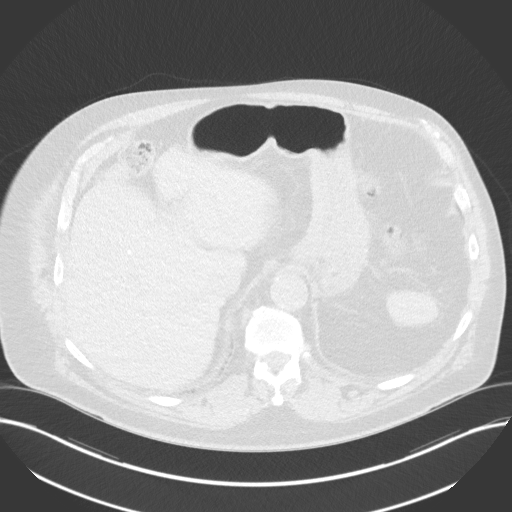
[im 10/60  lung]
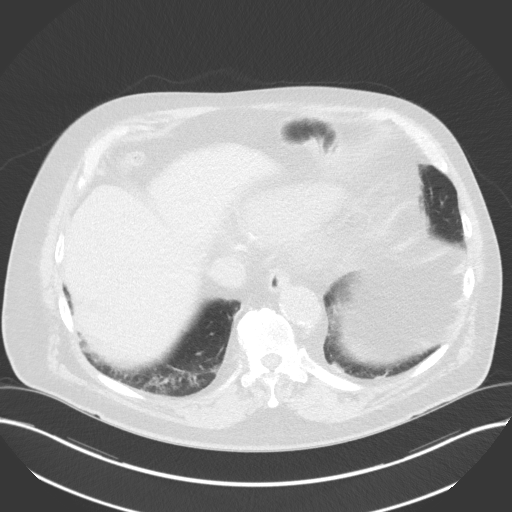
[im 14/60  lung]
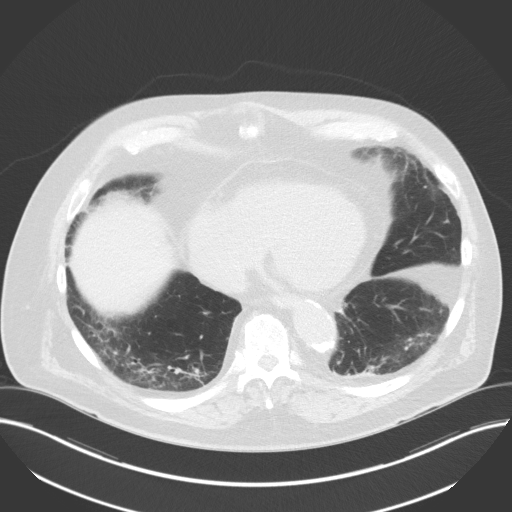
[im 19/60  lung]
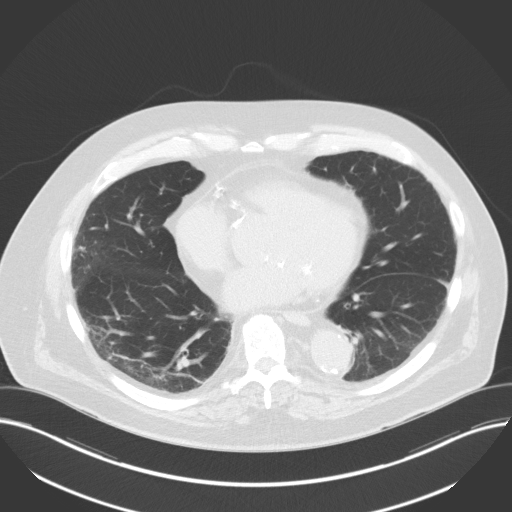
[im 23/60  mediastinal]
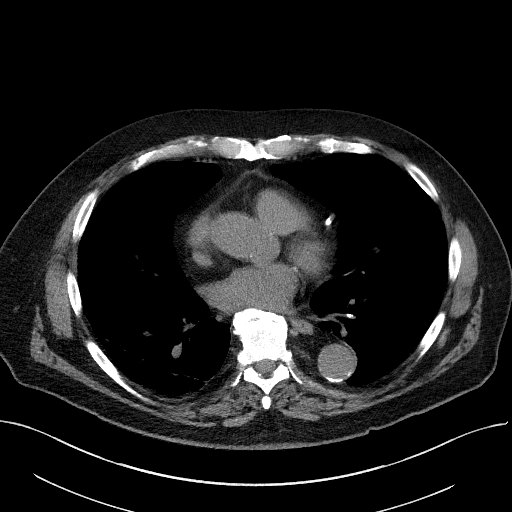
[im 23/60  lung]
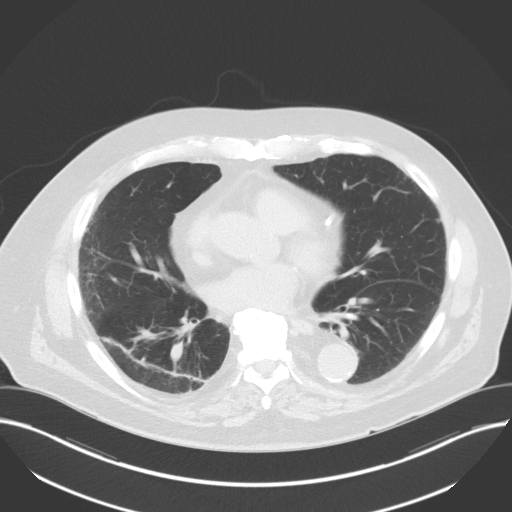
[im 28/60  lung]
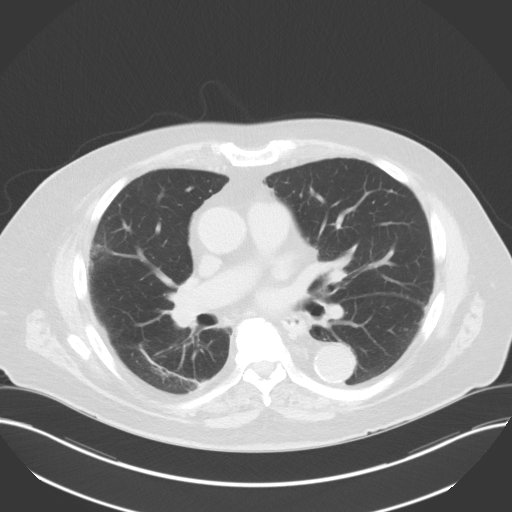
[im 32/60  lung]
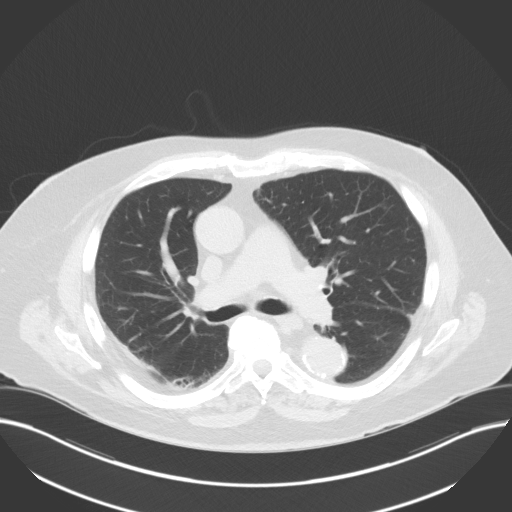
[im 37/60  lung]
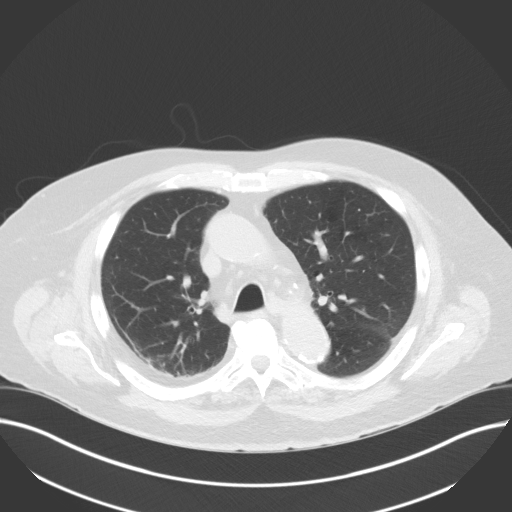
[im 41/60  mediastinal]
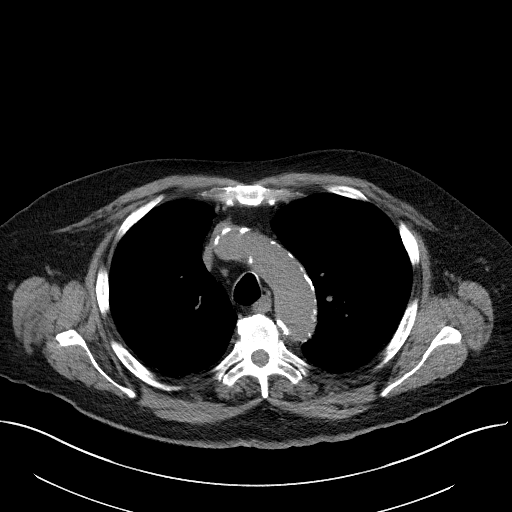
[im 41/60  lung]
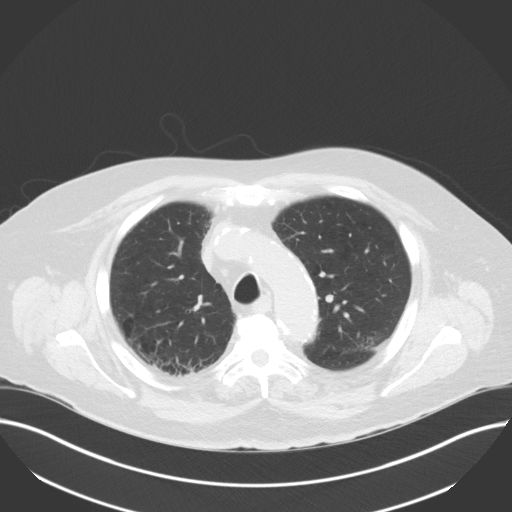
[im 46/60  lung]
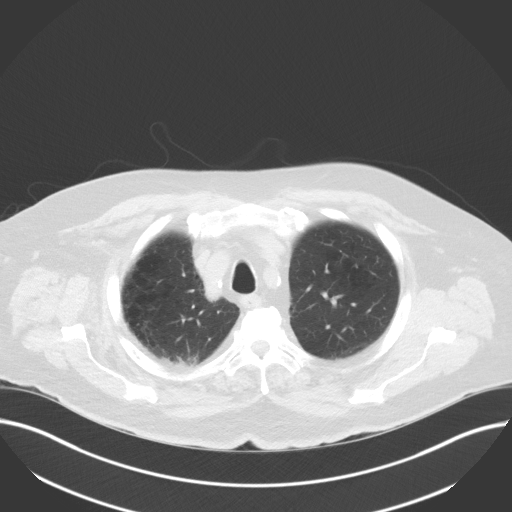
[im 50/60  lung]
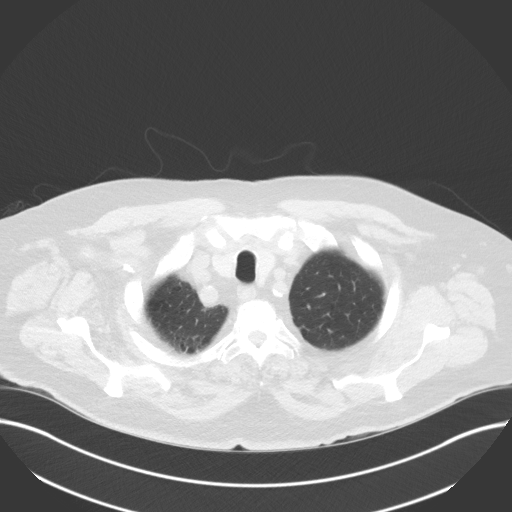
[im 55/60  lung]
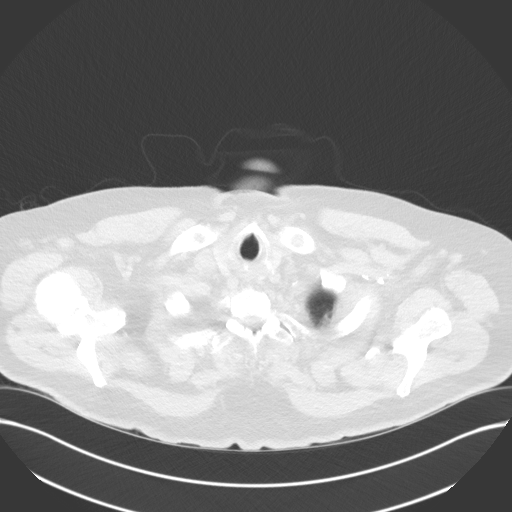

[Series 4: coronal · coronal · 0.62mm/px · 3 of 237 slices shown]
[im 48/237  lung]
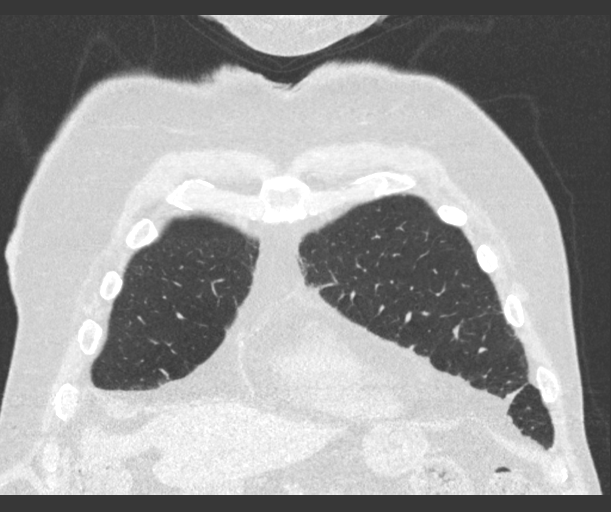
[im 95/237  lung]
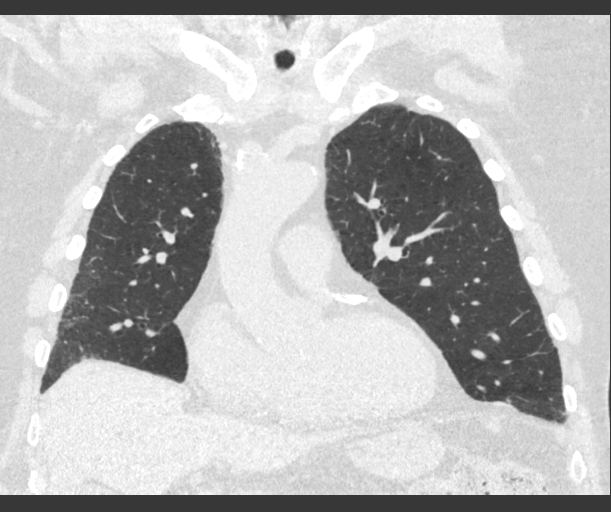
[im 142/237  lung]
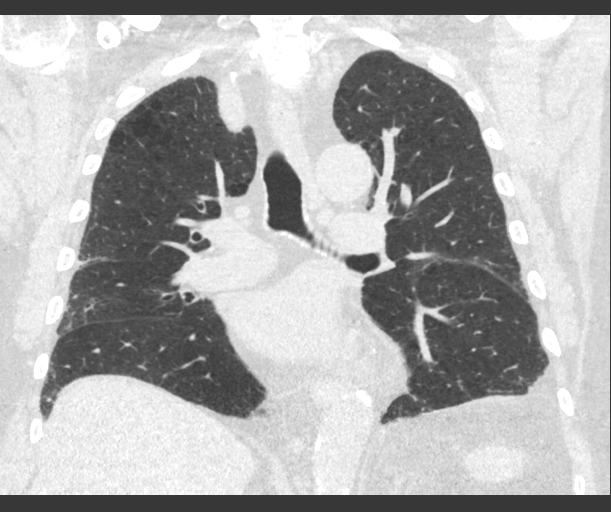

[15 of 40 positions shown; findings below may reference images not displayed]

FINDINGS: Cardiovascular: Normal heart size. No significant pericardial
fluid/thickening. Three-vessel coronary atherosclerosis.
Atherosclerotic nonaneurysmal thoracic aorta. Normal caliber
pulmonary arteries.

Mediastinum/Nodes: No discrete thyroid nodules. Unremarkable
esophagus. No pathologically enlarged axillary, mediastinal or gross
hilar lymph nodes, noting limited sensitivity for the detection of
hilar adenopathy on this noncontrast study. Stable nonenlarged
coarsely calcified AP window, subcarinal and left hilar nodes from
prior granulomatous disease.

Lungs/Pleura: No pneumothorax. No pleural effusion. Mild-to-moderate
centrilobular and paraseptal emphysema with diffuse bronchial wall
thickening. Stable parenchymal banding at both lung bases compatible
with postinfectious/postinflammatory scarring. No acute
consolidative airspace disease or lung masses. No significant
interval growth of previously visualized scattered pulmonary
nodules. No new significant pulmonary nodules.

Upper abdomen: Stable left upper quadrant splenule. Stable
granulomatous calcification in the right liver.

Musculoskeletal: No aggressive appearing focal osseous lesions.
Moderate thoracic spondylosis.
IMPRESSION: 1. Lung-RADS 2, benign appearance or behavior. Continue annual
screening with low-dose chest CT without contrast in 12 months.
2. Three-vessel coronary atherosclerosis.

Aortic Atherosclerosis (SQPTN-8VW.W) and Emphysema (SQPTN-L7U.J).

## 2018-08-04 NOTE — Assessment & Plan Note (Signed)
Resolved

## 2018-08-04 NOTE — Progress Notes (Signed)
_0  ID: [name], male    DOB: [birth year], 77 y.o.   MRN: [id number]  Chief Complaint  Patient presents with  . Hospitalization Follow-up    ILD follow-up    Referring provider: Javier Glazier, MD  HPI:  77 year old male former smoker referred to our office in 2018.  Managed in our office for COPD and obstructive sleep apnea (managed on CPAP).  PMH: Rheumatoid arthritis (currently on methotrexate)-followed by Dr. Trudie Reed Smoker/ Smoking History: Former Smoker, Quit 2012, 81 pack years Maintenance:  Breo Ellipta 100, Spiriva 2.5 Pt of: Dr. Lake Bells  08/04/2018  - Visit   77 year old male patient presenting today for hospital follow-up.  Patient recently had back surgery.  Patient reports that since being discharged she has been doing well.  Breathing is back to baseline.  Patient reports that his cough has significantly improved since stopping his ACE inhibitor.  Patient completed 2 weeks of rehab at [facility] where he lives.  Patient reports he is feeling much better.  Patient is preparing for an additional surgery due to a hernia.  Patient reports he has been adherent to Brio 100 as well as Spiriva 2.5.  Patient is frustrated as he scheduled for multiple office visits with our office and wants clarification on when he needs to follow-up.  Unfortunately patient missed follow-up with rheumatology Dr. Trudie Reed.  Patient reports he will reschedule this.      Tests:  Pulmonary function testing: >>>October 2015 from [facility] ratio 58% FEV1 1.54 L 57% predicted, FVC 2.65 L 70% predicted, total lung capacity 157% predicted residual volume 263% predicted DLCO 61% predic   07/04/2018-pulmonary function test- FVC 2.64 (71% predicted), ratio 71, FEV1 70, no significant bronchodilator response, DLCO 54  Chest imaging: March 2018 CT chest lung cancer screening: Rads 2, images independently reviewed showing nonspecific scarring more in the right lung base than the left, not  consistent with an interstitial lung disease 12/19/2017-CT chest lung cancer screening-lung RADS 2, follow-up in 12 months 05/30/2018-chest x-ray-stable bilateral interstitial prominence suggesting chronic interstitial lung disease 06/29/2018-CT chest high-res- spectrum of findings compatible with 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 PSG on 04/25/2000 gave an index of 78/hr.  Ever since then he has  been using CPAP at 8 C. WP  FENO:  No results found for: NITRICOXIDE  PFT: PFT Results Latest Ref Rng & Units 07/04/2018  FVC-Pre L 2.64  FVC-Predicted Pre % 71  FVC-Post L 2.59  FVC-Predicted Post % 70  Pre FEV1/FVC % % 71  Post FEV1/FCV % % 72  FEV1-Pre L 1.87  FEV1-Predicted Pre % 70  DLCO UNC% % 52  DLCO COR %Predicted % 78  TLC L 5.26  TLC % Predicted % 81  RV % Predicted % 109    Imaging: Dg Chest 2 View  Result Date: 07/13/2018 CLINICAL DATA:  History of recent back surgery, now with hypoxia EXAM: CHEST - 2 VIEW COMPARISON:  07/10/2018, 06/29/2018 radiograph and CT FINDINGS: Mild bibasilar atelectasis and fibrosis. No acute consolidation or pleural effusion. Enlarged cardiomediastinal silhouette with aortic atherosclerosis. No pneumothorax. Postsurgical changes in the lower thoracic spine. IMPRESSION: 1. Bibasilar atelectasis and scarring with tiny left effusion, not much interval change since 07/10/2018. 2. Cardiomegaly Electronically Signed   By: Donavan Foil M.D.   On: 07/13/2018 22:09  Dg Lumbar Spine 2-3 Views  Result Date: 07/10/2018 CLINICAL DATA:  T9-L5 fusion EXAM: LUMBAR SPINE - 2-3 VIEW; DG C-ARM 61-120 MIN COMPARISON:  Chest CT-06/29/2018 FINDINGS: Four spot intraoperative fluoroscopic images of the lower thoracic and lumbar  spine are provided for review. Spinal labeling is difficult given the coned field of view and non definitive visualization of the lumbosacral junction. Provided images demonstrate sequela paraspinal fusion at least 2 level intervertebral disc space replacement. Provided images demonstrate anatomic alignment. Additional surgical support apparatus is seen posterior to the operative site. No definite radiopaque foreign body. IMPRESSION: Post long segment thoracolumbar paraspinal fusion and intervertebral disc space replacement. Electronically Signed   By: Sandi Mariscal M.D.   On: 07/10/2018 16:27   Dg Lumbar Spine 1 View  Result Date: 07/10/2018 CLINICAL DATA:  Elective surgery.  Localization of L1-2 PLIF. EXAM: LUMBAR SPINE - 1 VIEW COMPARISON:  MRI of the lumbar spine 05/18/2018 FINDINGS: Intraoperative imaging of lumbar spine demonstrates a surgical probe at the L1-2 disc level. IMPRESSION: Intraoperative localization of L1-2. Electronically Signed   By: San Morelle M.D.   On: 07/10/2018 16:48   Dg Chest Port 1 View  Result Date: 07/10/2018 CLINICAL DATA:  Shortness of breath postoperatively. EXAM: PORTABLE CHEST 1 VIEW COMPARISON:  05/30/2018 FINDINGS: Interval spinal fusion. Worsened atelectasis in the left lower lobe with a small left effusion. Mild chronic scarring at the right lung base. Upper lungs are clear. IMPRESSION: Worsened atelectasis at the left base with a small left effusion. Electronically Signed   By: Nelson Chimes M.D.   On: 07/10/2018 19:34   Dg C-arm 1-60 Min  Result Date: 07/10/2018 CLINICAL DATA:  T9-L5 fusion EXAM: LUMBAR SPINE - 2-3 VIEW; DG C-ARM 61-120 MIN COMPARISON:  Chest CT-06/29/2018 FINDINGS: Four spot intraoperative fluoroscopic images of the lower thoracic and lumbar spine are provided for review. Spinal labeling is difficult given the coned field of view and non definitive visualization of the lumbosacral junction. Provided images demonstrate sequela  paraspinal fusion at least 2 level intervertebral disc space replacement. Provided images demonstrate anatomic alignment. Additional surgical support apparatus is seen posterior to the operative site. No definite radiopaque foreign body. IMPRESSION: Post long segment thoracolumbar paraspinal fusion and intervertebral disc space replacement. Electronically Signed   By: Sandi Mariscal M.D.   On: 07/10/2018 16:27    Chart Review:    Specialty Problems      Pulmonary Problems   GOLD COPD II B    Pulmonary function testing: October 2015 from [facility] ratio 58% FEV1 1.54 L 57% predicted, FVC 2.65 L 70% predicted, total lung capacity 157% predicted residual volume 263% predicted DLCO 61% predicted  07/04/2018-pulmonary function test- FVC 2.64 (71% predicted), ratio 71, FEV1 70, no significant bronchodilator response, DLCO 54       OSA on CPAP    >>>N. PSG on 04/25/2000 gave an index of 78/hr.  Ever since then he has  been using CPAP at 8 C. 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 Sulfa Antibiotics Shortness Of Breath  . Sulfasalazine Shortness Of Breath  . Lisinopril Cough  . Varenicline Other (See Comments)    Strange thoughts    Immunization History  Administered  Date(s) Administered  . 19-influenza Whole 07/18/2012, 06/14/2014, 08/12/2015, 06/03/2016  . HiB (PRP-T) 08/23/2017  . Influenza, High Dose Seasonal PF 06/12/2018  . Influenza,inj,Quad PF,6+ Mos 06/28/2017  . Influenza-Unspecified 07/18/2012, 06/14/2014, 08/12/2015, 06/03/2016, 06/28/2017  . Meningococcal B, OMV 08/23/2017, 09/21/2017  . Meningococcal Conjugate 08/23/2017  . Meningococcal Mcv4o 08/23/2017, 10/18/2017  . Pneumococcal Conjugate-13 11/28/2013, 11/28/2013  . Pneumococcal Polysaccharide-23 12/26/2005, 09/13/2012  . Pneumococcal-Unspecified 12/26/2005, 09/13/2012  . Tdap 11/28/2013, 11/28/2013  . Zoster 06/06/2012, 06/06/2012    Past Medical History:  Diagnosis Date  . Chronic lower back pain   . Complication of anesthesia 01/2017   was in ICU due to breathing complications-  . COPD (chronic obstructive pulmonary disease) ([place])   . Degenerative scoliosis in adult patient   . Depression   . Dyspnea    on exertion  . GERD (gastroesophageal reflux disease)   . H/O seasonal allergies   . High cholesterol   . 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 History of kidney stones   . Hypertension   . 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 OSA on CPAP    wears CPAP  . Pneumonia    "I've had it several times; last time was end of Jan 2018" (05/02/2017)  . Pre-diabetes   . Prostate cancer ([place])   . 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 Thrush 12/07/2017  . Wears glasses     Tobacco History: Social History   Tobacco Use  Smoking Status Former Smoker  . Packs/day: 1.50  . Years: 54.00  . Pack years: 81.00  . Types: Cigarettes  . Last attempt to quit: 09/27/2014  . Years since quitting: 3.8  Smokeless Tobacco Never Used  Tobacco Comment   05/02/2017 "quit vaping 09/2016"   Counseling given: Yes Comment: 05/02/2017 "quit vaping 09/2016"  Continue not smoking  Outpatient Encounter Medications  as of 08/04/2018  Medication Sig  . albuterol (PROVENTIL HFA;VENTOLIN HFA) 108 (90 Base) MCG/ACT inhaler Inhale 2 puffs into the lungs every 6 (six) hours as needed for wheezing or shortness of breath.  . ciprofloxacin (CIPRO) 750 MG tablet TAKE ONE TABLET BY MOUTH TWICE DAILY  . DULoxetine (CYMBALTA) 60 MG capsule Take 60 mg by mouth every evening.   . famotidine (PEPCID) 40 MG tablet Take 1 tablet (40 mg total) by mouth daily.  . finasteride (PROSCAR) 5 MG tablet Take 5 mg by mouth at bedtime.   . fluticasone (FLONASE) 50 MCG/ACT nasal spray Place 2 sprays into both nostrils daily as needed for allergies.   . folic acid (FOLVITE) 1 MG tablet Take 1 mg by mouth daily.  Marland Kitchen gabapentin (NEURONTIN) 300 MG capsule Take 600 mg by mouth 3 (three) times daily.  Marland Kitchen levETIRAcetam (KEPPRA) 1000 MG tablet Take 1,000 mg by mouth 2 (two) times daily.  Marland Kitchen losartan (COZAAR) 25 MG tablet Take 1 tablet (25 mg total) by mouth daily.  . metoprolol succinate (TOPROL-XL)  25 MG 24 hr tablet Take 1 tablet (25 mg total) by mouth 2 (two) times daily. Take with or immediately following a meal.  . oxyCODONE 10 MG TABS Take 1 tablet (10 mg total) by mouth every 4 (four) hours as needed for severe pain ((score 7 to 10)). (Patient taking differently: Take 5 mg by mouth every 4 (four) hours as needed for severe pain ((score 7 to 10)). )  . predniSONE (DELTASONE) 5 MG tablet Take 5 mg by mouth daily with breakfast.  . simvastatin (ZOCOR) 40 MG tablet Take 40 mg by mouth at bedtime.   . sodium chloride (OCEAN) 0.65 % SOLN nasal spray Place 1 spray into both nostrils as needed for congestion.  . tamsulosin (FLOMAX) 0.4 MG CAPS capsule Take 0.4 mg by mouth at bedtime.   . Tiotropium Bromide Monohydrate (SPIRIVA RESPIMAT) 2.5 MCG/ACT AERS Inhale 2 puffs into the lungs daily.  . traZODone (DESYREL) 50 MG tablet Take 50 mg by mouth at bedtime.   No facility-administered encounter medications on file as of 08/04/2018.     Review of  Systems  Review of Systems  Constitutional: Positive for fatigue. Negative for activity change, chills, fever and unexpected weight change.  HENT: Negative for postnasal drip, sinus pressure and sore throat.   Eyes: Negative.   Respiratory: Positive for shortness of breath. Negative for cough and wheezing.   Cardiovascular: Negative for chest pain and palpitations.  Gastrointestinal: Negative for constipation, diarrhea, nausea and vomiting.  Endocrine: Negative.   Musculoskeletal: Negative.   Skin: Negative.   Neurological: Negative for dizziness and headaches.  Psychiatric/Behavioral: Negative.  Negative for dysphoric mood. The patient is not nervous/anxious.   All other systems reviewed and are negative.    Physical Exam  BP (!) 142/80 (BP Location: Left Arm, Cuff Size: Normal)   Pulse (!) 109   Ht _0  (1.727 m)   Wt 234 lb 9.6 oz (106.4 kg)   SpO2 92%   BMI 35.67 kg/m   Wt Readings from Last 5 Encounters:  08/04/18 234 lb 9.6 oz (106.4 kg)  07/28/18 232 lb (105.2 kg)  07/10/18 230 lb (104.3 kg)  07/04/18 230 lb (104.3 kg)  07/03/18 230 lb 3.2 oz (104.4 kg)     Physical Exam  Constitutional: He is oriented to person, place, and time and well-developed, well-nourished, and in no distress. No distress.  HENT:  Head: Normocephalic and atraumatic.  Right Ear: Hearing, tympanic membrane, external ear and ear canal normal.  Left Ear: Hearing, tympanic membrane, external ear and ear canal normal.  Nose: Nose normal. Right sinus exhibits no maxillary sinus tenderness and no frontal sinus tenderness. Left sinus exhibits no maxillary sinus tenderness and no frontal sinus tenderness.  Mouth/Throat: Uvula is midline and oropharynx is clear and moist. No oropharyngeal exudate.  Eyes: Pupils are equal, round, and reactive to light.  Neck: Normal range of motion. Neck supple.  Cardiovascular: Normal rate, regular rhythm and normal heart sounds.  Pulmonary/Chest: Effort normal and  breath sounds normal. No accessory muscle usage. No respiratory distress. He has no decreased breath sounds. He has no wheezes. He has no rhonchi.  Lung PE - limited by back brace in place  Abdominal: Soft. Bowel sounds are normal. There is no tenderness.  Musculoskeletal: Normal range of motion. He exhibits no edema.  Back brace   Lymphadenopathy:    He has no cervical adenopathy.  Neurological: He is alert and oriented to person, place, and time. Gait normal.  Skin:  Skin is warm and dry. He is not diaphoretic. No erythema.  Psychiatric: Mood, memory, affect and judgment normal.  Nursing note and vitals reviewed.     Lab Results:  CBC    Component Value Date/Time   WBC 14.7 (H) 07/13/2018 1858   RBC 2.78 (L) 07/13/2018 1858   HGB 9.8 (L) 07/13/2018 1858   HCT 30.7 (L) 07/13/2018 1858   PLT 244 07/13/2018 1858   MCV 110.4 (H) 07/13/2018 1858   MCH 35.3 (H) 07/13/2018 1858   MCHC 31.9 07/13/2018 1858   RDW 15.2 07/13/2018 1858   LYMPHSABS 2,195 12/07/2017 1041   EOSABS 246 12/07/2017 1041   BASOSABS 56 12/07/2017 1041    BMET    Component Value Date/Time   NA 134 (L) 07/13/2018 1858   K 3.7 07/13/2018 1858   CL 100 07/13/2018 1858   CO2 26 07/13/2018 1858   GLUCOSE 120 (H) 07/13/2018 1858   BUN 15 07/13/2018 1858   CREATININE 0.84 07/13/2018 1858   CREATININE 0.89 03/20/2018 1054   CALCIUM 8.0 (L) 07/13/2018 1858   GFRNONAA >60 07/13/2018 1858   GFRNONAA 84 03/20/2018 1054   GFRAA >60 07/13/2018 1858   GFRAA 97 03/20/2018 1054    BNP    Component Value Date/Time   BNP 120.0 (H) 07/13/2018 1858    ProBNP No results found for: PROBNP   Assessment & Plan:   Pleasant 77 year old male patient presents today for hospital follow-up.  Patient is doing well.  We will hold off on additional testing at this time.  We will have patient keep follow-up with Dr. Lake Bells in February/2020.  Patient still needs to follow-up with rheumatology.  Patient reports he will call  and get this scheduled.  If there is any changes based off his rheumatology medications he will contact our office.  GOLD COPD II B Breo Ellipta 100 >>> Take 1 puff daily in the morning right when you wake up >>>Rinse your mouth out after use >>>This is a daily maintenance inhaler, NOT a rescue inhaler >>>Contact our office if you are having difficulties affording or obtaining this medication >>>It is important for you to be able to take this daily and not miss any doses  Spiriva Respimat 2.5 >>> 2 puffs daily >>> Do this every day >>>This is not a rescue inhaler  Keep follow-up with Dr. Lake Bells as scheduled   ILD (interstitial lung disease) ([place])  Keep follow-up with Dr. Lake Bells as scheduled  Continue daily prednisone Continue methotrexate Continue to follow-up with rheumatology  OSA on CPAP We recommend that you continue using your CPAP daily >>>Keep up the hard work using your device >>> Goal should be wearing this for the entire night that you are sleeping, at least 4 to 6 hours  Remember:  . Do not drive or operate heavy machinery if tired or drowsy.  . Please notify the supply company and office if you are unable to use your device regularly due to missing supplies or machine being broken.  . Work on maintaining a healthy weight and following your recommended nutrition plan  . Maintain proper daily exercise and movement  . Maintaining proper use of your device can also help improve management of other chronic illnesses such as: Blood pressure, blood sugars, and weight management.   BiPAP/ CPAP Cleaning:  >>>Clean weekly, with Dawn soap, and bottle brush.  Set up to air dry.    Cough due to ACE inhibitor Resolved      Lauraine Rinne,  NP 08/04/2018

## 2018-08-04 NOTE — Assessment & Plan Note (Signed)
Breo Ellipta 100 >>> Take 1 puff daily in the morning right when you wake up >>>Rinse your mouth out after use >>>This is a daily maintenance inhaler, NOT a rescue inhaler >>>Contact our office if you are having difficulties affording or obtaining this medication >>>It is important for you to be able to take this daily and not miss any doses  Spiriva Respimat 2.5 >>> 2 puffs daily >>> Do this every day >>>This is not a rescue inhaler  Keep follow-up with Dr. Lake Bells as scheduled

## 2018-08-04 NOTE — Patient Instructions (Addendum)
Breo Ellipta 100 >>> Take 1 puff daily in the morning right when you wake up >>>Rinse your mouth out after use >>>This is a daily maintenance inhaler, NOT a rescue inhaler >>>Contact our office if you are having difficulties affording or obtaining this medication >>>It is important for you to be able to take this daily and not miss any doses  Spiriva Respimat 2.5 >>> 2 puffs daily >>> Do this every day >>>This is not a rescue inhaler  Keep follow-up with Dr. Lake Bells as scheduled  Continue daily prednisone Continue methotrexate Continue to follow-up with rheumatology      November/2019 we will be moving! We will no longer be at our [place] location.  Be on the look out for a post card/mailer to let you know we have officially moved.  Our new address and phone number will be:  [address] [place], [place] [address] Telephone number: [phone]  It is flu season:   >>>Remember to be washing your hands regularly, using hand sanitizer, be careful to use around herself with has contact with people who are sick will increase her chances of getting sick yourself. >>> Best ways to protect herself from the flu: Receive the yearly flu vaccine, practice good hand hygiene washing with soap and also using hand sanitizer when available, eat a nutritious meals, get adequate rest, hydrate appropriately   Please contact the office if your symptoms worsen or you have concerns that you are not improving.   Thank you for choosing [facility] for your healthcare, and for allowing [place] to partner with you on your healthcare journey. I am thankful to be able to provide care to you today.   [name] FNP-C

## 2018-08-04 NOTE — Assessment & Plan Note (Signed)

## 2018-08-04 NOTE — Assessment & Plan Note (Signed)
  Keep follow-up with Dr. Lake Bells as scheduled  Continue daily prednisone Continue methotrexate Continue to follow-up with rheumatology

## 2018-08-17 ENCOUNTER — Telehealth: Payer: Self-pay | Admitting: Behavioral Health

## 2018-08-17 NOTE — Telephone Encounter (Signed)
He should probably come see me in the clinic

## 2018-08-17 NOTE — Telephone Encounter (Signed)
Patient called stating he saw [name] back in June 2019 and missed his follow up appointment September 2019 due to having back surgery.  Patient states Dr. [name] operated on him and did a wound culture during surgery and patient states it came back negative.  Patient has two questions, 1.Does he still need to see [name] for follow up and 2. Does he need to continue taking Cipro which he has continued to take. Pricilla Riffle RN

## 2018-08-23 ENCOUNTER — Ambulatory Visit: Payer: Medicare Other | Admitting: Pulmonary Disease

## 2018-08-28 ENCOUNTER — Ambulatory Visit: Payer: Medicare Other | Admitting: Infectious Disease

## 2018-09-11 ENCOUNTER — Ambulatory Visit (INDEPENDENT_AMBULATORY_CARE_PROVIDER_SITE_OTHER): Payer: Medicare Other | Admitting: Infectious Diseases

## 2018-09-11 ENCOUNTER — Encounter: Payer: Self-pay | Admitting: Infectious Diseases

## 2018-09-11 VITALS — BP 137/72 | HR 97 | Temp 97.6°F | Wt 230.0 lb

## 2018-09-11 DIAGNOSIS — A498 Other bacterial infections of unspecified site: Secondary | ICD-10-CM | POA: Diagnosis not present

## 2018-09-11 DIAGNOSIS — M4620 Osteomyelitis of vertebra, site unspecified: Secondary | ICD-10-CM

## 2018-09-11 NOTE — Progress Notes (Signed)
Patient: [name]  DOB: 1941/04/21 MRN: [id number] PCP: Javier Glazier, MD   Patient Active Problem List   Diagnosis Date Noted  . Lumbar adjacent segment disease with spondylolisthesis 07/10/2018  . ILD (interstitial lung disease) ([place]) 07/04/2018  . Dyspnea 06/13/2018  . Cough due to ACE inhibitor 06/13/2018  . Former tobacco use 06/13/2018  . Acute sinusitis 05/30/2018  . Thrush 12/07/2017  . Tendinopathy of right gluteus medius 11/02/2017  . 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 Hardware complicating wound infection ([place])   . Vertebral osteomyelitis ([place])   . Pseudomonas aeruginosa infection   . Rheumatoid arthritis involving multiple sites with positive rheumatoid factor ([place])   . 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/2017  . Surgery, elective   . Hypoxia   . Lumbar degenerative disc disease 02/17/2017  . Cigarette smoker 11/17/2016  . 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 Allergic rhinitis 03/23/2008  . HYPERLIPIDEMIA 03/15/2008  . 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 PNEUMONIA 03/14/2008  . GOLD COPD II B 03/14/2008  . Rheumatoid arthritis ([place]) 03/14/2008  . OSA on CPAP 03/14/2008     Subjective:  CC:  Follow up after back surgery. Wants to stop ciprofloxacin.   HPI:  [name] is a 77 y.o. pleasant male patient of Dr. Lucianne Lei Dam's here today for follow up on hardware associated vertebral osteomyelitis d/t pseudomonas. First seen back in 04-2017 after lumbar surgery requiring HW placement that was complicated by CSF leak and deep infection d/t pseudomonas (sensitive). He has tolerated for over a year now high dose ciprofloxacin therapy but is tired of taking this if no longer needed. He also carries a h/o RA previously on [employer]. His treatment after 96mof antibiotics had a good result with wound healing and normalization of inflmmatory markers. 3/19 had a gluteal tendon tear with fluid collection that  was aspirated and found to be sterile (on cipro therapy) - underwent R gluteal tendon repair. At the visit in March Dr. VTommy Medaladvised 1 year of continued therapy d/t immunocompromised host.   Since last year his inflammatory markers have remained flat, no fevers/chills. Dr. JArnoldo Moraleon 07/10/18 performed posterior lumbar interbody fusion, interbody prosthesis L1-2, T9-L5. Op Note reviewed - no purulence or necrotic tissue noted to suggest gross infection. intraop cultures were negative, again he was taking cipro at this time.    He has been recovering well since surgery - still with brace. Has not started physical therapy yet but has had follow up with Dr. JArnoldo Moraleand reports well healed surgical incision and w/o signs of infection. He hopes to begin rehab soon. Denies any fevers/chills or worsening pain. No diarrhea/abdominal pain or joint pain.   Review of Systems  Constitutional: Negative for chills, fever, malaise/fatigue and weight loss.  HENT: Negative for sore throat.        No dental problems  Respiratory: Negative for cough and sputum production.   Cardiovascular: Negative for chest pain and leg swelling.  Gastrointestinal: Negative for abdominal pain, diarrhea and vomiting.  Genitourinary: Negative for dysuria and flank pain.  Musculoskeletal: Negative for joint pain, myalgias and neck pain.  Skin: Negative for rash.  Neurological: Negative for dizziness, tingling and headaches.  Psychiatric/Behavioral: Negative for depression and substance abuse. The patient is not nervous/anxious and does not have insomnia.     Past Medical History:  Diagnosis Date  . Chronic lower back pain   . Complication of anesthesia 01/2017   was  in ICU due to breathing complications-  . COPD (chronic obstructive pulmonary disease) ([place])   . Degenerative scoliosis in adult patient   . Depression   . Dyspnea    on exertion  . GERD (gastroesophageal reflux disease)   . H/O seasonal allergies   . High  cholesterol   . 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 History of kidney stones   . Hypertension   . 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 OSA on CPAP    wears CPAP  . Pneumonia    "I've had it several times; last time was end of Jan 2018" (05/02/2017)  . Pre-diabetes   . Prostate cancer ([place])   . Rheumatoid arteritis ([place])    "all over" (05/02/2017)  . Thrush 12/07/2017  . Wears glasses     Outpatient Medications Prior to Visit  Medication Sig Dispense Refill  . albuterol (PROVENTIL HFA;VENTOLIN HFA) 108 (90 Base) MCG/ACT inhaler Inhale 2 puffs into the lungs every 6 (six) hours as needed for wheezing or shortness of breath.    . ciprofloxacin (CIPRO) 750 MG tablet TAKE ONE TABLET BY MOUTH TWICE DAILY 180 tablet 0  . DULoxetine (CYMBALTA) 60 MG capsule Take 60 mg by mouth every evening.     . famotidine (PEPCID) 40 MG tablet Take 1 tablet (40 mg total) by mouth daily. 30 tablet 6  . finasteride (PROSCAR) 5 MG tablet Take 5 mg by mouth at bedtime.     . fluticasone (FLONASE) 50 MCG/ACT nasal spray Place 2 sprays into both nostrils daily as needed for allergies.     . folic acid (FOLVITE) 1 MG tablet Take 1 mg by mouth daily.    Marland Kitchen gabapentin (NEURONTIN) 300 MG capsule Take 600 mg by mouth 3 (three) times daily.    Marland Kitchen levETIRAcetam (KEPPRA) 1000 MG tablet Take 1,000 mg by mouth 2 (two) times daily.    Marland Kitchen losartan (COZAAR) 25 MG tablet Take 1 tablet (25 mg total) by mouth daily. 90 tablet 3  . metoprolol succinate (TOPROL-XL) 25 MG 24 hr tablet Take 1 tablet (25 mg total) by mouth 2 (two) times daily. Take with or immediately following a meal. 180 tablet 3  . predniSONE (DELTASONE) 5 MG tablet Take 5 mg by mouth daily with breakfast.    . simvastatin (ZOCOR) 40 MG tablet Take 40 mg by mouth at bedtime.     . sodium chloride (OCEAN) 0.65 % SOLN nasal spray Place 1 spray into both nostrils as needed for congestion.    . tamsulosin  (FLOMAX) 0.4 MG CAPS capsule Take 0.4 mg by mouth at bedtime.     . Tiotropium Bromide Monohydrate (SPIRIVA RESPIMAT) 2.5 MCG/ACT AERS Inhale 2 puffs into the lungs daily. 3 Inhaler 3  . traZODone (DESYREL) 50 MG tablet Take 50 mg by mouth at bedtime.    Marland Kitchen oxyCODONE 10 MG TABS Take 1 tablet (10 mg total) by mouth every 4 (four) hours as needed for severe pain ((score 7 to 10)). (Patient not taking: Reported on 09/11/2018) 30 tablet 0   No facility-administered medications prior to visit.      Allergies  Allergen Reactions  . Sulfa Antibiotics Shortness Of Breath  . Sulfasalazine Shortness Of Breath  . Lisinopril Cough  . Varenicline Other (See Comments)    Strange thoughts    Social History   Tobacco Use  . Smoking status: Former Smoker    Packs/day: 1.50  Years: 54.00    Pack years: 81.00    Types: Cigarettes    Last attempt to quit: 09/27/2014    Years since quitting: 3.9  . Smokeless tobacco: Never Used  . Tobacco comment: 05/02/2017 "quit vaping 09/2016"  Substance Use Topics  . Alcohol use: Yes    Alcohol/week: 17.0 standard drinks    Types: 7 Cans of beer, 10 Shots of liquor per week    Comment: Daily shots, beer etc  . Drug use: No    Family History  Problem Relation Age of Onset  . Cancer Mother   . Heart disease Father   . Stroke Father     Objective:   Vitals:   09/11/18 1538  BP: 137/72  Pulse: 97  Temp: 97.6 F (36.4 C)  Weight: 230 lb (104.3 kg)   Body mass index is 34.97 kg/m.  Physical Exam Vitals signs reviewed.  Constitutional:      General: He is not in acute distress.    Appearance: He is not ill-appearing.  HENT:     Mouth/Throat:     Mouth: Mucous membranes are moist.     Pharynx: Oropharynx is clear.  Eyes:     General: No scleral icterus.    Pupils: Pupils are equal, round, and reactive to light.  Neck:     Musculoskeletal: Normal range of motion.  Cardiovascular:     Rate and Rhythm: Normal rate and regular rhythm.      Pulses: Normal pulses.     Heart sounds: No murmur.  Pulmonary:     Effort: Pulmonary effort is normal. No respiratory distress.     Breath sounds: Normal breath sounds.  Musculoskeletal:     Comments: Lumbar brace in place.   Skin:    General: Skin is warm and dry.     Capillary Refill: Capillary refill takes less than 2 seconds.  Neurological:     General: No focal deficit present.     Mental Status: He is alert.  Psychiatric:        Mood and Affect: Mood normal.        Thought Content: Thought content normal.    Lab Results: Lab Results  Component Value Date   WBC 14.7 (H) 07/13/2018   HGB 9.8 (L) 07/13/2018   HCT 30.7 (L) 07/13/2018   MCV 110.4 (H) 07/13/2018   PLT 244 07/13/2018    Lab Results  Component Value Date   CREATININE 0.84 07/13/2018   BUN 15 07/13/2018   NA 134 (L) 07/13/2018   K 3.7 07/13/2018   CL 100 07/13/2018   CO2 26 07/13/2018    Lab Results  Component Value Date   ALT 21 07/03/2018   AST 26 07/03/2018   ALKPHOS 46 07/03/2018   BILITOT 0.8 07/03/2018    Sed Rate (mm/h)  Date Value  09/11/2018 17  03/20/2018 6  12/07/2017 9   CRP (mg/L)  Date Value  09/11/2018 0.7  03/20/2018 1.0  12/07/2017 1.8    Assessment & Plan:   Problem List Items Addressed This Visit      Unprioritized   Vertebral osteomyelitis ([place])    Explained how intraoperative cultures being negative while on antibiotics could reflect absence/cure of infection but also could have been false negative being he was on abx therapy. He has been on therapy for 16 months now and tolerating well. Will check inflammatory markers today and if they continue to be normal I think we can consider stopping and monitoring him  closely for relapse. He is in full support of this plan.   Will discuss with [name] to ensure he has no reservations with this plan.       Relevant Orders   Sedimentation rate (Completed)   C-reactive protein (Completed)   Pseudomonas aeruginosa  infection - Primary     Janene Madeira, MSN, NP-C [facility] Pager: [phone] Office: [phone]  09/12/18  1:42 PM

## 2018-09-11 NOTE — Patient Instructions (Addendum)
Wonderful to meet you today.   Glad you are on the road to recovery - I am hopeful that we can consider stopping your antibiotic. I have your operative notes and will review with [name] once your labs return.   We would like to arrange follow up about 4-6 weeks after stopping your antibiotics to check in again and monitor you for any concerning signs of re-infection. We will set this appointment up on the phone later this week.   Will be in touch later this week! Merry Christmas!

## 2018-09-12 LAB — SEDIMENTATION RATE: Sed Rate: 17 mm/h (ref 0–20)

## 2018-09-12 LAB — C-REACTIVE PROTEIN: CRP: 0.7 mg/L (ref <8.0)

## 2018-09-12 NOTE — Assessment & Plan Note (Signed)
Explained how intraoperative cultures being negative while on antibiotics could reflect absence/cure of infection but also could have been false negative being he was on abx therapy. He has been on therapy for 16 months now and tolerating well. Will check inflammatory markers today and if they continue to be normal I think we can consider stopping and monitoring him closely for relapse. He is in full support of this plan.   Will discuss with [name] to ensure he has no reservations with this plan.

## 2018-09-18 ENCOUNTER — Telehealth: Payer: Self-pay | Admitting: Behavioral Health

## 2018-09-18 NOTE — Telephone Encounter (Signed)
Patient called stating he was supposed to receive a follow up call from [place].  Patient is wondering if her needs to continue the Cipro.  Patient states we can give him a call or send him a follow up My Chart message. Pricilla Riffle RN

## 2018-09-18 NOTE — Telephone Encounter (Signed)
MyChart sent to [name] - he will stop cipro and follow up in 8 weeks with [name]. He has been counseled on precautions that would warrant earlier follow up as there is a risk for reemergence of infection if it was not entirely cleared.

## 2018-09-18 NOTE — Addendum Note (Signed)
Addended by: Stapleton Callas on: 09/18/2018 02:00 PM   Modules accepted: Orders

## 2018-09-18 NOTE — Telephone Encounter (Signed)
Please give him a call to let him know that I have not had the opportunity to discuss with [name] - he in the past has been very nervous about stopping this and would like to review with him. We are working together today and he will get a message/call from me later on. I didn't forget about him though :)

## 2018-10-04 ENCOUNTER — Ambulatory Visit: Payer: Medicare Other | Admitting: Pulmonary Disease

## 2018-10-05 NOTE — Telephone Encounter (Signed)
This patient's MyChart messages went to the wrong pool where [facility] did not receive the messages.  Patient has been seen since this message and addressed. Nothing further needed at this time.  

## 2018-11-01 ENCOUNTER — Ambulatory Visit: Payer: Medicare Other | Admitting: Pulmonary Disease

## 2018-11-01 ENCOUNTER — Encounter: Payer: Self-pay | Admitting: Pulmonary Disease

## 2018-11-01 VITALS — BP 136/68 | HR 82 | Ht 68.0 in | Wt 234.0 lb

## 2018-11-01 DIAGNOSIS — M05741 Rheumatoid arthritis with rheumatoid factor of right hand without organ or systems involvement: Secondary | ICD-10-CM | POA: Diagnosis not present

## 2018-11-01 DIAGNOSIS — Z9989 Dependence on other enabling machines and devices: Secondary | ICD-10-CM

## 2018-11-01 DIAGNOSIS — J42 Unspecified chronic bronchitis: Secondary | ICD-10-CM | POA: Diagnosis not present

## 2018-11-01 DIAGNOSIS — J849 Interstitial pulmonary disease, unspecified: Secondary | ICD-10-CM | POA: Diagnosis not present

## 2018-11-01 DIAGNOSIS — G4733 Obstructive sleep apnea (adult) (pediatric): Secondary | ICD-10-CM | POA: Diagnosis not present

## 2018-11-01 DIAGNOSIS — J309 Allergic rhinitis, unspecified: Secondary | ICD-10-CM

## 2018-11-01 DIAGNOSIS — M05742 Rheumatoid arthritis with rheumatoid factor of left hand without organ or systems involvement: Secondary | ICD-10-CM

## 2018-11-01 NOTE — Patient Instructions (Signed)
Sinus congestion: Try taking over-the-counter phenylephrine Continue saline sprays Continue Flonase Continue Mucinex  COPD: Continue Breo Continue Incruse Keep using albuterol as needed for chest tightness wheezing or shortness of breath Practice good hand hygiene Stay physically active  Interstitial lung disease related to underlying rheumatoid arthritis: Continue methotrexate Continue prednisone We will repeat a lung function test in October 2020 We will consider whether or not you need to be on a drug to slow the progression of rheumatoid arthritis related fibrotic disease after the next lung function test later this year.  We will see you back in 4 months or sooner if need

## 2018-11-01 NOTE — Addendum Note (Signed)
Addended by: Valerie Salts on: 11/01/2018 01:55 PM   Modules accepted: Orders

## 2018-11-01 NOTE — Progress Notes (Signed)
Subjective:    [name], male    DOB: [birth year], 78 y.o.   MRN: [id number]  Synopsis: Referred for COPD in 2018. He has a history of tobacco repeat abuse and smoked 2 packs of cigarettes daily for 52 years, quit in October 2016. 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 He also has rheumatoid arthritis.  He has sleep apnea: His N. PSG on 04/25/2000 gave an index of 78/hr.  Ever since then he has  been using CPAP at [facility]  HPI Chief Complaint  Patient presents with  . Follow-up    pt states he c/o sinus congestion, stuffy ears, sob, pnd X7-8 wks.     [place] has struggled with recurrent bronchitis lately.  He says that good days are more frequent than bad days.  He would like to get rid of his ear congestion.  He has a lot of pain in his years from the pressure.  He says his breathing has been doing fairly well recently though.  He is not producing mucus from his chest anymore.  He still has some congestion in his sinuses which is quite bothersome.  Past Medical History:  Diagnosis Date  . Chronic lower back pain   . Complication of anesthesia 01/2017   was in ICU due to breathing complications-  . COPD (chronic obstructive pulmonary disease) ([place])   . Degenerative scoliosis in adult patient   . Depression   . Dyspnea    on exertion  . GERD (gastroesophageal reflux disease)   . H/O seasonal allergies   . High cholesterol   . 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 History of kidney stones   . Hypertension   . 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 OSA on CPAP    wears CPAP  . Pneumonia    "I've had it several times; last time was end of Jan 2018" (05/02/2017)  . Pre-diabetes   . Prostate cancer ([place])   . Rheumatoid arteritis ([place])    "all over" (05/02/2017)  . Thrush 12/07/2017  . Wears glasses        Review of Systems  Constitutional: Positive for fatigue. Negative for chills and  fever.  HENT: Negative for nosebleeds, postnasal drip, rhinorrhea, sinus pressure and sinus pain.   Respiratory: Negative for cough, shortness of breath and wheezing.   Cardiovascular: Positive for leg swelling. Negative for chest pain and palpitations.  Musculoskeletal: Positive for arthralgias and back pain. Negative for joint swelling and myalgias.  Neurological: Negative for seizures, speech difficulty, light-headedness and headaches.       Objective:   Physical Exam Vitals:   11/01/18 1328  BP: 136/68  Pulse: 82  SpO2: 97%  Weight: 234 lb (106.1 kg)  Height: _0  (1.727 m)   RA  Gen: chronically ill appearing HENT: OP clear, TM's clear, neck supple PULM: Crackles bases B, normal percussion CV: RRR, no mgr, trace edema GI: BS+, soft, nontender Derm: no cyanosis or rash Psyche: normal mood and affect   Pulmonary function testing: October 2015 [facility] ratio 58% FEV1 1.54 L 57% predicted, FVC 2.65 L 70% predicted, total lung capacity 157% predicted residual volume 263% predicted DLCO 61% predicted October 2019 ratio 71%, FEV1 1.86 L 70% predicted, FVC 2.59 L 70% predicted, total lung capacity 5.3 L 81% predicted, DLCO 14.09 52% predicted  Chest imaging: March 2018 CT chest lung cancer screening: [place]  2, images independently reviewed showing nonspecific scarring more in the right lung base than the left, not consistent with an interstitial lung disease  Polysomnogram: His N. PSG on 04/25/2000 gave an index of 78/hr.  Ever since then he has  been using CPAP at 8 [facility]. [facility]     Assessment & Plan:   Chronic bronchitis, unspecified chronic bronchitis type ([place])  ILD (interstitial lung disease) ([place])  OSA on CPAP  Rheumatoid arthritis involving both hands with positive rheumatoid factor ([facility])  Allergic rhinitis, unspecified seasonality, unspecified trigger  Discussion: Though he has evidence of interstitial lung disease on the CT scan of his chest there has been  minimal change in his lung function testing since 2015.  I think that this unusual and nonspecific pattern of scarring is likely related to his rheumatoid arthritis so my recommendation is that he continue taking methotrexate and prednisone as he is doing.  However, would need to keep a close eye on this with repeat lung function testing in October of this year.  If there is evidence of progression then he would need to consider taking nintedanib.  He had recurrent sinus congestion and bronchitis recently.  It seems that he is over the bronchitis piece but he still has some sinus symptoms which can be treated conservatively.  Plan: Sinus congestion: Try taking over-the-counter phenylephrine Continue saline sprays Continue Flonase Continue Mucinex  OSA Continue CPAP  COPD: Continue Breo Continue Incruse Keep using albuterol as needed for chest tightness wheezing or shortness of breath Practice good hand hygiene Stay physically active  Interstitial lung disease related to underlying rheumatoid arthritis: Continue methotrexate Continue prednisone We will repeat a lung function test in October 2020 We will consider whether or not you need to be on a drug to slow the progression of rheumatoid arthritis related fibrotic disease after the next lung function test later this year.  We will see you back in 4 months or sooner if need  > 50% of this 25 min visit spent face to face    Current Outpatient Medications:  .  albuterol (PROVENTIL HFA;VENTOLIN HFA) 108 (90 Base) MCG/ACT inhaler, Inhale 2 puffs into the lungs every 6 (six) hours as needed for wheezing or shortness of breath., Disp: , Rfl:  .  DULoxetine (CYMBALTA) 60 MG capsule, Take 60 mg by mouth every evening. , Disp: , Rfl:  .  famotidine (PEPCID) 40 MG tablet, Take 1 tablet (40 mg total) by mouth daily., Disp: 30 tablet, Rfl: 6 .  finasteride (PROSCAR) 5 MG tablet, Take 5 mg by mouth at bedtime. , Disp: , Rfl:  .  fluticasone  (FLONASE) 50 MCG/ACT nasal spray, Place 2 sprays into both nostrils daily as needed for allergies. , Disp: , Rfl:  .  fluticasone furoate-vilanterol (BREO ELLIPTA) 100-25 MCG/INH AEPB, Inhale 1 puff into the lungs daily., Disp: , Rfl:  .  folic acid (FOLVITE) 1 MG tablet, Take 1 mg by mouth daily., Disp: , Rfl:  .  gabapentin (NEURONTIN) 300 MG capsule, Take 600 mg by mouth 3 (three) times daily., Disp: , Rfl:  .  levETIRAcetam (KEPPRA) 1000 MG tablet, Take 1,000 mg by mouth 2 (two) times daily., Disp: , Rfl:  .  losartan (COZAAR) 25 MG tablet, Take 1 tablet (25 mg total) by mouth daily., Disp: 90 tablet, Rfl: 3 .  methotrexate (RHEUMATREX) 2.5 MG tablet, Take 2.5 mg by mouth once a week. Caution:Chemotherapy. Protect from light., Disp: , Rfl:  .  predniSONE (DELTASONE) 5 MG  tablet, Take 5 mg by mouth daily with breakfast., Disp: , Rfl:  .  simvastatin (ZOCOR) 40 MG tablet, Take 40 mg by mouth at bedtime. , Disp: , Rfl:  .  sodium chloride (OCEAN) 0.65 % SOLN nasal spray, Place 1 spray into both nostrils as needed for congestion., Disp: , Rfl:  .  tamsulosin (FLOMAX) 0.4 MG CAPS capsule, Take 0.4 mg by mouth at bedtime. , Disp: , Rfl:  .  Tiotropium Bromide Monohydrate (SPIRIVA RESPIMAT) 2.5 MCG/ACT AERS, Inhale 2 puffs into the lungs daily., Disp: 3 Inhaler, Rfl: 3 .  traZODone (DESYREL) 50 MG tablet, Take 50 mg by mouth at bedtime., Disp: , Rfl:  .  metoprolol succinate (TOPROL-XL) 25 MG 24 hr tablet, Take 1 tablet (25 mg total) by mouth 2 (two) times daily. Take with or immediately following a meal., Disp: 180 tablet, Rfl: 3

## 2018-11-15 NOTE — Progress Notes (Signed)
Patient: [name]  DOB: March 19, 1941 MRN: [id number] PCP: Javier Glazier, MD   Patient Active Problem List   Diagnosis Date Noted  . Lumbar adjacent segment disease with spondylolisthesis 07/10/2018  . ILD (interstitial lung disease) ([place]) 07/04/2018  . Dyspnea 06/13/2018  . Cough due to ACE inhibitor 06/13/2018  . Former tobacco use 06/13/2018  . Acute sinusitis 05/30/2018  . Thrush 12/07/2017  . Tendinopathy of right gluteus medius 11/02/2017  . 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 Hardware complicating wound infection ([place])   . Vertebral osteomyelitis ([place])   . Pseudomonas aeruginosa infection   . Rheumatoid arthritis involving multiple sites with positive rheumatoid factor ([place])   . 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/2017  . Hypoxia   . Lumbar degenerative disc disease 02/17/2017  . Cigarette smoker 11/17/2016  . 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 Allergic rhinitis 03/23/2008  . HYPERLIPIDEMIA 03/15/2008  . 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 PNEUMONIA 03/14/2008  . GOLD COPD II B 03/14/2008  . Rheumatoid arthritis ([place]) 03/14/2008  . OSA on CPAP 03/14/2008     Subjective:  CC:  Follow up HW associated back infection - off abx x 20m   HPI:  GBILBO CARCAMOis a 78y.o. male with a history of hardware associated vertebral osteomyelitis d/t pseudomonas. First seen back in 04-2017 after lumbar surgery requiring HW placement that was complicated by CSF leak and deep infection d/t pseudomonas (sensitive). He has tolerated for over a year now high dose ciprofloxacin therapy but is tired of taking this if no longer needed. He also carries a h/o RA previously on [employer]. His treatment after 661mf antibiotics had a good result with wound healing and normalization of inflmmatory markers. 3/19 had a gluteal tendon tear with fluid collection that was aspirated and found to be sterile (on cipro therapy) - underwent R  gluteal tendon repair. At the visit in March Dr. VaTommy Medaldvised 1 year of continued therapy d/t immunocompromised host. During protracted treatment his inflammatory markers have remained flat, no fevers/chills. Dr. JeArnoldo Moralen 07/10/18 performed posterior lumbar interbody fusion, interbody prosthesis L1-2, T9-L5. Op Note reviewed - no purulence or necrotic tissue noted to suggest gross infection. intraop cultures were negative, again he was taking cipro at this time.    HPI: He was seen by me last in December 2019 and requested consideration to stop his Cipro. Since this time he has stopped antibiotics and continued to work with PT with his recovery. He has some "pulling" sensation to the midback sometimes and some pain down the right hamstring but he wonders if this is more due to the therapy. He has no fevers/chills. Pain is stable and has been able to push more with therapy. His incision is well healed and w/o any concerns for infection.   Review of Systems  Constitutional: Negative for chills, fever, malaise/fatigue and weight loss.  HENT: Negative for sore throat.        No dental problems  Respiratory: Negative for cough and sputum production.   Cardiovascular: Negative for chest pain and leg swelling.  Gastrointestinal: Negative for abdominal pain, diarrhea and vomiting.  Genitourinary: Negative for dysuria and flank pain.  Musculoskeletal: Positive for back pain. Negative for joint pain, myalgias and neck pain.  Skin: Negative for rash.  Neurological: Negative for dizziness, tingling and headaches.  Psychiatric/Behavioral: Negative for depression and substance abuse. The patient is not nervous/anxious and does not have insomnia.  Past Medical History:  Diagnosis Date  . Chronic lower back pain   . Complication of anesthesia 01/2017   was in ICU due to breathing complications-  . COPD (chronic obstructive pulmonary disease) ([place])   . Degenerative scoliosis in adult patient   .  Depression   . Dyspnea    on exertion  . GERD (gastroesophageal reflux disease)   . H/O seasonal allergies   . High cholesterol   . 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 History of kidney stones   . Hypertension   . 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 OSA on CPAP    wears CPAP  . Pneumonia    "I've had it several times; last time was end of Jan 2018" (05/02/2017)  . Pre-diabetes   . Prostate cancer ([place])   . Rheumatoid arteritis ([place])    "all over" (05/02/2017)  . Thrush 12/07/2017  . Wears glasses     Outpatient Medications Prior to Visit  Medication Sig Dispense Refill  . albuterol (PROVENTIL HFA;VENTOLIN HFA) 108 (90 Base) MCG/ACT inhaler Inhale 2 puffs into the lungs every 6 (six) hours as needed for wheezing or shortness of breath.    . DULoxetine (CYMBALTA) 60 MG capsule Take 60 mg by mouth every evening.     . famotidine (PEPCID) 40 MG tablet Take 1 tablet (40 mg total) by mouth daily. 30 tablet 6  . finasteride (PROSCAR) 5 MG tablet Take 5 mg by mouth at bedtime.     . fluticasone (FLONASE) 50 MCG/ACT nasal spray Place 2 sprays into both nostrils daily as needed for allergies.     . fluticasone furoate-vilanterol (BREO ELLIPTA) 100-25 MCG/INH AEPB Inhale 1 puff into the lungs daily.    . folic acid (FOLVITE) 1 MG tablet Take 1 mg by mouth daily.    Marland Kitchen gabapentin (NEURONTIN) 300 MG capsule Take 600 mg by mouth 3 (three) times daily.    Marland Kitchen levETIRAcetam (KEPPRA) 1000 MG tablet Take 1,000 mg by mouth 2 (two) times daily.    Marland Kitchen losartan (COZAAR) 25 MG tablet Take 1 tablet (25 mg total) by mouth daily. 90 tablet 3  . methotrexate (RHEUMATREX) 2.5 MG tablet Take 2.5 mg by mouth once a week. Caution:Chemotherapy. Protect from light.    . predniSONE (DELTASONE) 5 MG tablet Take 5 mg by mouth daily with breakfast.    . simvastatin (ZOCOR) 40 MG tablet Take 40 mg by mouth at bedtime.     . sodium chloride (OCEAN) 0.65 %  SOLN nasal spray Place 1 spray into both nostrils as needed for congestion.    . tamsulosin (FLOMAX) 0.4 MG CAPS capsule Take 0.4 mg by mouth at bedtime.     . Tiotropium Bromide Monohydrate (SPIRIVA RESPIMAT) 2.5 MCG/ACT AERS Inhale 2 puffs into the lungs daily. 3 Inhaler 3  . traZODone (DESYREL) 50 MG tablet Take 50 mg by mouth at bedtime.    . metoprolol succinate (TOPROL-XL) 25 MG 24 hr tablet Take 1 tablet (25 mg total) by mouth 2 (two) times daily. Take with or immediately following a meal. 180 tablet 3   No facility-administered medications prior to visit.      Allergies  Allergen Reactions  . Sulfa Antibiotics Shortness Of Breath  . Sulfasalazine Shortness Of Breath  . Lisinopril Cough  . Varenicline Other (See Comments)    Strange thoughts    Social History   Tobacco Use  . Smoking status: Former  Smoker    Packs/day: 1.50    Years: 54.00    Pack years: 81.00    Types: Cigarettes    Last attempt to quit: 09/27/2014    Years since quitting: 4.1  . Smokeless tobacco: Never Used  . Tobacco comment: 05/02/2017 "quit vaping 09/2016"  Substance Use Topics  . Alcohol use: Yes    Alcohol/week: 17.0 standard drinks    Types: 7 Cans of beer, 10 Shots of liquor per week    Comment: Daily shots, beer etc  . Drug use: No    Objective:   Vitals:   11/16/18 0943  BP: (!) 143/93  Pulse: 80  Temp: 98.3 F (36.8 C)  Weight: 236 lb (107 kg)   Body mass index is 35.88 kg/m.  Physical Exam Vitals signs reviewed.  Constitutional:      General: He is not in acute distress.    Appearance: He is not ill-appearing.  HENT:     Mouth/Throat:     Mouth: Mucous membranes are moist.     Pharynx: Oropharynx is clear.  Eyes:     General: No scleral icterus.    Pupils: Pupils are equal, round, and reactive to light.  Neck:     Musculoskeletal: Normal range of motion.  Cardiovascular:     Rate and Rhythm: Normal rate and regular rhythm.     Pulses: Normal pulses.     Heart sounds:  No murmur.  Pulmonary:     Effort: Pulmonary effort is normal. No respiratory distress.     Breath sounds: Normal breath sounds.  Musculoskeletal:     Comments: Midline incision well healed and w/o any signs of infection. Rollator walker   Skin:    General: Skin is warm and dry.     Capillary Refill: Capillary refill takes less than 2 seconds.  Neurological:     General: No focal deficit present.     Mental Status: He is alert.  Psychiatric:        Mood and Affect: Mood normal.        Thought Content: Thought content normal.    Lab Results: Lab Results  Component Value Date   WBC 14.7 (H) 07/13/2018   HGB 9.8 (L) 07/13/2018   HCT 30.7 (L) 07/13/2018   MCV 110.4 (H) 07/13/2018   PLT 244 07/13/2018    Lab Results  Component Value Date   CREATININE 0.84 07/13/2018   BUN 15 07/13/2018   NA 134 (L) 07/13/2018   K 3.7 07/13/2018   CL 100 07/13/2018   CO2 26 07/13/2018    Lab Results  Component Value Date   ALT 21 07/03/2018   AST 26 07/03/2018   ALKPHOS 46 07/03/2018   BILITOT 0.8 07/03/2018    Sed Rate (mm/h)  Date Value  09/11/2018 17  03/20/2018 6  12/07/2017 9   CRP (mg/L)  Date Value  09/11/2018 0.7  03/20/2018 1.0  12/07/2017 1.8    Assessment & Plan:   Problem List Items Addressed This Visit      Unprioritized   Vertebral osteomyelitis ([place]) - Primary    He seems to be doing very well since stopping antibiotics. Incision is well healed w/o tenderness, erythema or fluctuance. His activity seems to be picking up more as he works with PT.  Will check inflammatory markers for trend today. He can follow up with [name] one more time in 3 months.       Relevant Orders   C-reactive protein  Sedimentation rate   CBC     Janene Madeira, MSN, NP-C [facility] for Infect[facility] [place] Pager: [phone] Office: [phone]  11/16/18  2:57 PM

## 2018-11-16 ENCOUNTER — Encounter: Payer: Self-pay | Admitting: Infectious Diseases

## 2018-11-16 ENCOUNTER — Ambulatory Visit: Payer: Medicare Other | Admitting: Infectious Diseases

## 2018-11-16 VITALS — BP 143/93 | HR 80 | Temp 98.3°F | Wt 236.0 lb

## 2018-11-16 DIAGNOSIS — M4620 Osteomyelitis of vertebra, site unspecified: Secondary | ICD-10-CM | POA: Diagnosis not present

## 2018-11-16 NOTE — Patient Instructions (Signed)
It seems that you are doing well off antibiotics.   Your incision looks well healed and no signs of infection. It sounds like your physical therapy is coming along nicely as well.   Will check some blood work for reassurance today and continue to see how you do off of antibiotics.   Will call you with results and next steps either Friday or early next week.

## 2018-11-16 NOTE — Assessment & Plan Note (Signed)
He seems to be doing very well since stopping antibiotics. Incision is well healed w/o tenderness, erythema or fluctuance. His activity seems to be picking up more as he works with PT.  Will check inflammatory markers for trend today. He can follow up with [name] one more time in 3 months.

## 2018-11-17 LAB — CBC
HEMATOCRIT: 41 % (ref 38.5–50.0)
Hemoglobin: 13.7 g/dL (ref 13.2–17.1)
MCH: 32.6 pg (ref 27.0–33.0)
MCHC: 33.4 g/dL (ref 32.0–36.0)
MCV: 97.6 fL (ref 80.0–100.0)
MPV: 9.4 fL (ref 7.5–12.5)
Platelets: 255 10*3/uL (ref 140–400)
RBC: 4.2 10*6/uL (ref 4.20–5.80)
RDW: 15.8 % — AB (ref 11.0–15.0)
WBC: 10.4 10*3/uL (ref 3.8–10.8)

## 2018-11-17 LAB — SEDIMENTATION RATE: SED RATE: 6 mm/h (ref 0–20)

## 2018-11-17 LAB — C-REACTIVE PROTEIN: CRP: 0.5 mg/L (ref <8.0)

## 2018-11-17 NOTE — Progress Notes (Signed)
Inflammatory markers remain flat 8w off cipro. Will continue off antibiotics.  I called to discuss with [name] and offered an appointment with [name] in 28mfor follow up.

## 2018-12-04 MED ORDER — TIOTROPIUM BROMIDE MONOHYDRATE 2.5 MCG/ACT IN AERS: 2.0000 | INHALATION_SPRAY | Freq: Every day | RESPIRATORY_TRACT | 3 refills | Status: DC

## 2018-12-04 NOTE — Telephone Encounter (Signed)
Pt clarified which med he had at home and stated it was spiriva that he was currently taking along with the [facility]. Sent Rx to pt's preferred pharmacy. Nothing further needed.

## 2018-12-04 NOTE — Addendum Note (Signed)
Addended by: Lorretta Harp on: 12/04/2018 05:16 PM   Modules accepted: Orders

## 2018-12-04 NOTE — Telephone Encounter (Signed)
He should keep taking Breo  He should be on either Incruise or Spiriva, not both.  It doesn't matter which drug, whichever he has at home and his insurance covers is best.

## 2018-12-04 NOTE — Telephone Encounter (Signed)
Dr. Lake Bells, mychart message was received by pt in regards to pt about to be out of spiriva respimat but then when we look at last OV pt had with you, you stated for pt to continue taking Breo and continue taking Incruse.  When looking at pt's current med list, I am seeing Breo and Spiriva on there but not Incruse.  Please clarify if this was supposed to be for pt to continue Breo and Spiriva instead of Breo and Incruse. Thanks!

## 2019-01-22 MED ORDER — FLUTICASONE FUROATE-VILANTEROL 100-25 MCG/INH IN AEPB: 1.0000 | INHALATION_SPRAY | Freq: Every day | RESPIRATORY_TRACT | 1 refills | Status: AC

## 2019-02-20 ENCOUNTER — Telehealth: Payer: Self-pay | Admitting: Infectious Disease

## 2019-02-20 NOTE — Telephone Encounter (Signed)
[id number] Pre-Screening Questions: 02/20/19  Do you currently have a fever (>100 F), chills or unexplained body aches? NO  Are you currently experiencing new cough, shortness of breath, sore throat, runny nose? NO    Have you recently travelled outside the state of [place] in the last 14 days? NO   Have you been in contact with someone that is currently pending confirmation of Covid19 testing or has been confirmed to have the [place] virus? NO  **If the patient answers NO to ALL questions -  advise the patient to please call the clinic before coming to the office should any symptoms develop.

## 2019-02-21 ENCOUNTER — Encounter: Payer: Self-pay | Admitting: Infectious Disease

## 2019-02-21 ENCOUNTER — Ambulatory Visit: Payer: Medicare Other | Admitting: Pulmonary Disease

## 2019-02-21 ENCOUNTER — Ambulatory Visit: Payer: Medicare Other | Admitting: Infectious Disease

## 2019-02-21 ENCOUNTER — Other Ambulatory Visit: Payer: Self-pay

## 2019-02-21 VITALS — BP 152/91 | HR 80 | Temp 98.2°F | Wt 245.0 lb

## 2019-02-21 DIAGNOSIS — M0579 Rheumatoid arthritis with rheumatoid factor of multiple sites without organ or systems involvement: Secondary | ICD-10-CM

## 2019-02-21 DIAGNOSIS — Z87891 Personal history of nicotine dependence: Secondary | ICD-10-CM | POA: Diagnosis not present

## 2019-02-21 DIAGNOSIS — M4620 Osteomyelitis of vertebra, site unspecified: Secondary | ICD-10-CM

## 2019-02-21 DIAGNOSIS — A498 Other bacterial infections of unspecified site: Secondary | ICD-10-CM

## 2019-02-21 DIAGNOSIS — T847XXD Infection and inflammatory reaction due to other internal orthopedic prosthetic devices, implants and grafts, subsequent encounter: Secondary | ICD-10-CM | POA: Diagnosis not present

## 2019-02-21 NOTE — Progress Notes (Signed)
_0  ID: [name], male    DOB: Jun 07, 1941, 78 y.o.   MRN: [id number]  Chief Complaint  Patient presents with  . Follow-up    ILD follow up     Referring provider: Javier Glazier, MD  HPI:  78 year old male former smoker referred to our office in 2018.  Managed in our office for COPD and obstructive sleep apnea (managed on CPAP).  PMH: Rheumatoid arthritis (currently on methotrexate)-followed by Dr. Trudie Reed Smoker/ Smoking History: Former Smoker, Quit 2012, 81 pack years Maintenance:  Breo Ellipta 100, Spiriva 2.5 Pt of: Dr. Lake Bells  02/22/2019  - Visit   78 year old male former smoker presenting to our office today for a follow-up visit.  Patient reports that since last being seen by our office in February/2020 he feels that his breathing has worsened.  Patient has his spouse on the telephone for the office visit and she also confirms that she has concerns about his breathing.  Patient spouse and patient both have concerns regarding whether or not he needs oxygen.  Patient spouse reports that the patient is not sleeping well although CPAP compliance report shows that he has been sleeping for 12 hours daily.  Patient spouse also has concerns regarding the patient's breathing as well as moaning at night.  CPAP compliance report shows excellent compliance as well as no Cheyne-Stokes respirations.  See CPAP compliance report listed below.  01/22/2019-02/20/2019-CPAP compliance report-30 out of last 30 days use, all 30 those days greater than 4 hours, average usage 11 hours and 58 minutes, CPAP set pressure of 8, AHI 2.1, no Cheyne-Stokes respirations observed via CPAP.  Patient reports that the CPAP compliance report is accurate.  He does admit though that it takes about 1/2 to 2 hours to fall asleep due to his left leg and hip pain.  Patient feels that typically on average he sleeps for about 8 to 10 hours a night.  He does have occasional leaks on his CPAP report but he does not  believe this is affecting his breathing or his sleeping.  Patient plans to follow-up with orthopedics and his back surgeon regarding his back pain as well as his left leg pain.  He is unsure if he would be a candidate for another surgical procedure to help with this pain.  Patient also has follow-up with gastroenterology next week because he is continued to have ongoing bouts of constipation and has had some abdominal distention since his back surgery in 2019.  MMRC - Breathlessness Score 3 - I stop for breath after walking about 100 yards or after a few minutes on level ground (isle at grocery store is 193f)      Tests:   P[facility] function [facility]: [facility] FEV1 1.54 L 57% predicted, FVC 2.65 L 70% predicted, total lung capacity 157% predicted residual volume 263% predicted DLCO 61% predic   07/04/2018-pulmonary function test- FVC 2.64 (71% predicted), ratio 71, FEV1 70, no significant bronchodilator response, DLCO 54  Chest imaging: March 2018 CT chest lung cancer screening: Rads 2, images independently reviewed showing nonspecific scarring more in the right lung base than the left, not consistent with an interstitial lung disease 12/19/2017-CT chest lung cancer screening-lung RADS 2, follow-up in 12 months 05/30/2018-chest x-ray-stable bilateral interstitial prominence suggesting chronic interstitial lung disease 06/29/2018-CT chest high-res- spectrum of findings compatible with 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 PSG on 04/25/2000 gave an index of 78/hr.  Ever since then he has  been using CPAP at 8 C. WP  SIX MIN WALK 02/22/2019 11/17/2016  Supplimental Oxygen during Test? (L/min) No No  Tech Comments: walk with pt  at slow pace, pt did experience SOB and had to stop multiple times. O2 sat dropped to 86 but quickly recovered with rest. Walk stopped early due to elevevated HR of 154 and c/o slight dizziness. pt walked a moderate pace, did not complete lap 3 d/t back pain.      FENO:  No results found for: NITRICOXIDE  PFT: PFT Results Latest Ref Rng & Units 07/04/2018  FVC-Pre L 2.64  FVC-Predicted Pre % 71  FVC-Post L 2.59  FVC-Predicted Post % 70  Pre FEV1/FVC % % 71  Post FEV1/FCV % % 72  FEV1-Pre L 1.87  FEV1-Predicted Pre % 70  FEV1-Post L 1.86  DLCO UNC% % 52  DLCO COR %Predicted % 78  TLC L 5.26  TLC % Predicted % 81  RV % Predicted % 109    Imaging: No results found.    Specialty Problems      Pulmonary Problems   GOLD COPD II B    Pulmonary function testing: October 2015 from [facility] ratio 58% FEV1 1.54 L 57% predicted, FVC 2.65 L 70% predicted, total lung capacity 157% predicted residual volume 263% predicted DLCO 61% predicted  07/04/2018-pulmonary function test- FVC 2.64 (71% predicted), ratio 71, FEV1 70, no significant bronchodilator response, DLCO 54       OSA on CPAP    >>>N. PSG on 04/25/2000 gave an index of 78/hr.  Ever since then he has  been using CPAP at 8 C. 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 Sulfa Antibiotics Shortness Of Breath  . Sulfasalazine Shortness Of Breath  . Lisinopril Cough  . Varenicline Other (See Comments)    Strange thoughts    Immunization History  Administered Date(s) Administered  . 19-influenza Whole 07/18/2012, 06/14/2014, 08/12/2015, 06/03/2016  . HiB (PRP-T) 08/23/2017  . Influenza, High Dose Seasonal PF 06/12/2018  . Influenza,inj,Quad PF,6+ Mos 06/28/2017  . Influenza-Unspecified 07/18/2012, 06/14/2014, 08/12/2015, 06/03/2016, 06/28/2017  . Meningococcal B, OMV 08/23/2017, 09/21/2017  . Meningococcal Conjugate 08/23/2017  . Meningococcal Mcv4o 08/23/2017, 10/18/2017  . Pneumococcal Conjugate-13 11/28/2013, 11/28/2013  . Pneumococcal Polysaccharide-23 12/26/2005, 09/13/2012  . Pneumococcal-Unspecified 12/26/2005, 09/13/2012  . Tdap 11/28/2013, 11/28/2013  . Zoster 06/06/2012, 06/06/2012    Past Medical History:  Diagnosis Date  . Chronic lower back pain   . Complication of anesthesia 01/2017   was in ICU due to breathing complications-  .  COPD (chronic obstructive pulmonary disease) ([place])   . Degenerative scoliosis in adult patient   . Depression   . Dyspnea    on exertion  . GERD (gastroesophageal reflux disease)   . H/O seasonal allergies   . High cholesterol   . 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 History of kidney stones   . Hypertension   . 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 OSA on CPAP    wears CPAP  . Pneumonia    "I've had it several times; last time was end of Jan 2018" (05/02/2017)  . Pre-diabetes   . Prostate cancer ([place])   . Rheumatoid arteritis ([place])    "all over" (05/02/2017)  . Thrush 12/07/2017  . Wears glasses     Tobacco History: Social History   Tobacco Use  Smoking Status Former Smoker  . Packs/day: 1.50  . Years: 54.00  . Pack years: 81.00  . Types: Cigarettes  . Start  date: 02/21/1961  . Last attempt to quit: 09/27/2014  . Years since quitting: 4.4  Smokeless Tobacco Never Used  Tobacco Comment   05/02/2017 "quit vaping 09/2016"   Counseling given: Yes Comment: 05/02/2017 "quit vaping 09/2016"  Continue to not smoke  Outpatient Encounter Medications as of 02/22/2019  Medication Sig  . albuterol (PROVENTIL HFA;VENTOLIN HFA) 108 (90 Base) MCG/ACT inhaler Inhale 2 puffs into the lungs every 6 (six) hours as needed for wheezing or shortness of breath.  . DULoxetine (CYMBALTA) 60 MG capsule Take 60 mg by mouth every evening.   . finasteride (PROSCAR) 5 MG tablet Take 5 mg by mouth at bedtime.   . fluticasone (FLONASE) 50 MCG/ACT nasal spray Place 2 sprays into both nostrils daily as needed for allergies.   . fluticasone furoate-vilanterol (BREO ELLIPTA) 100-25 MCG/INH AEPB Inhale 1 puff into the lungs daily.  . folic acid (FOLVITE) 1 MG tablet Take 1 mg by mouth daily.  Marland Kitchen gabapentin (NEURONTIN) 300 MG capsule Take 600 mg by mouth 3 (three) times daily.  Marland Kitchen losartan (COZAAR) 25 MG tablet Take 1 tablet (25 mg total) by mouth daily.  . methotrexate (RHEUMATREX) 2.5 MG tablet Take 2.5 mg by mouth once a week. Caution:Chemotherapy. Protect from light.  . predniSONE (DELTASONE) 5 MG tablet Take 5 mg by mouth daily with breakfast.  . simvastatin (ZOCOR) 40 MG tablet Take 40 mg by mouth at bedtime.   . sodium chloride (OCEAN) 0.65 % SOLN nasal spray Place 1 spray into both nostrils as needed for congestion.  . tamsulosin (FLOMAX) 0.4 MG CAPS capsule Take 0.4 mg by mouth at bedtime.   . Tiotropium Bromide Monohydrate (SPIRIVA RESPIMAT) 2.5 MCG/ACT AERS Inhale 2 puffs into the lungs daily.  . traZODone (DESYREL) 50 MG tablet Take 50 mg by mouth at bedtime.  . famotidine (PEPCID) 40 MG tablet Take 1 tablet (40 mg total) by mouth daily. (Patient not taking: Reported on 02/22/2019)  . metoprolol succinate (TOPROL-XL) 25 MG 24 hr tablet Take 1 tablet (25 mg total) by mouth 2 (two)  times daily. Take with or immediately following a meal.  . [DISCONTINUED] levETIRAcetam (KEPPRA) 1000 MG tablet Take 1,000 mg by mouth 2 (two) times daily.   No facility-administered encounter medications on file as of 02/22/2019.      Review of Systems  Review of Systems  Constitutional: Positive for fatigue. Negative for activity change, chills, fever and unexpected weight change.  HENT: Negative for postnasal drip, rhinorrhea,  sneezing and sore throat.   Eyes: Negative.   Respiratory: Positive for shortness of breath. Negative for cough and wheezing.   Cardiovascular: Negative for chest pain and palpitations.  Gastrointestinal: Positive for abdominal distention and constipation. Negative for diarrhea, nausea and vomiting.  Endocrine: Negative.   Musculoskeletal: Positive for arthralgias, back pain, gait problem (Walks with cane) and joint swelling.  Skin: Negative.   Neurological: Negative for dizziness and headaches.  Psychiatric/Behavioral: Negative.  Negative for dysphoric mood. The patient is not nervous/anxious.   All other systems reviewed and are negative.    Physical Exam  Temp 97.8 F (36.6 C) (Oral)   Ht _0  (1.727 m)   Wt 245 lb 12.8 oz (111.5 kg)   SpO2 94%   BMI 37.37 kg/m   Wt Readings from Last 5 Encounters:  02/22/19 245 lb 12.8 oz (111.5 kg)  02/21/19 245 lb (111.1 kg)  11/16/18 236 lb (107 kg)  11/01/18 234 lb (106.1 kg)  09/11/18 230 lb (104.3 kg)     Physical Exam  Constitutional: He is oriented to person, place, and time and well-developed, well-nourished, and in no distress. No distress.  HENT:  Head: Normocephalic and atraumatic.  Right Ear: Hearing, tympanic membrane, external ear and ear canal normal.  Left Ear: Hearing, tympanic membrane, external ear and ear canal normal.  Nose: Rhinorrhea present. Right sinus exhibits no maxillary sinus tenderness and no frontal sinus tenderness. Left sinus exhibits no maxillary sinus tenderness and no  frontal sinus tenderness.  Mouth/Throat: Uvula is midline and oropharynx is clear and moist. No oropharyngeal exudate.  mallampati 3  Eyes: Pupils are equal, round, and reactive to light.  Neck: Normal range of motion. Neck supple.  Cardiovascular: Normal rate, regular rhythm and normal heart sounds.  Pulmonary/Chest: Effort normal and breath sounds normal. No accessory muscle usage. No respiratory distress. He has no decreased breath sounds. He has no wheezes. He has no rhonchi.  Bibasilar crackles right greater than left  Abdominal: Soft. He exhibits distension. He exhibits no mass. Bowel sounds are hyperactive. There is abdominal tenderness (RUQ tenderness ) in the right upper quadrant. There is no rebound, no guarding and no CVA tenderness.  Musculoskeletal: Normal range of motion.        General: Edema (1+ lower extremity edema bilaterally,) present.       Arms:  Lymphadenopathy:    He has no cervical adenopathy.  Neurological: He is alert and oriented to person, place, and time. Gait normal.  Skin: Skin is warm and dry. He is not diaphoretic. No erythema.  Psychiatric: Mood, memory, affect and judgment normal.  Nursing note and vitals reviewed.     Lab Results:  CBC    Component Value Date/Time   WBC 12.5 (H) 02/21/2019 1125   RBC 4.02 (L) 02/21/2019 1125   HGB 14.0 02/21/2019 1125   HCT 41.6 02/21/2019 1125   PLT 332 02/21/2019 1125   MCV 103.5 (H) 02/21/2019 1125   MCH 34.8 (H) 02/21/2019 1125   MCHC 33.7 02/21/2019 1125   RDW 13.9 02/21/2019 1125   LYMPHSABS 1,550 02/21/2019 1125   EOSABS 113 02/21/2019 1125   BASOSABS 38 02/21/2019 1125    BMET    Component Value Date/Time   NA 139 02/21/2019 1125   K 4.2 02/21/2019 1125   CL 100 02/21/2019 1125   CO2 29 02/21/2019 1125   GLUCOSE 124 (H) 02/21/2019 1125   BUN 17 02/21/2019 1125   CREATININE 0.94 02/21/2019 1125   CALCIUM  9.1 02/21/2019 1125   GFRNONAA 78 02/21/2019 1125   GFRAA 91 02/21/2019 1125     BNP    Component Value Date/Time   BNP 120.0 (H) 07/13/2018 1858    ProBNP No results found for: PROBNP    Assessment & Plan:   OSA on CPAP Assessment: Excellent CPAP compliance Well-controlled AHI on CPAP compliance report today 2001 sleep study shows an AHI of 78  Plan: Continue CPAP therapy We will order overnight oximetry test to evaluate oxygen needs with CPAP on  ILD (interstitial lung disease) ([place]) Plan: Walk in office today  >>> Oxygen desaturations did occur will start 2L O2 with exertion  Overnight Oximetry ordered today  DME: [facility] On room air with CPAP  Continue methotrexate Continue prednisone We will repeat a lung function test in October 2020 We will consider whether or not you need to be on a drug to slow the progression of rheumatoid arthritis related fibrotic disease after the next lung function test later this year. >>> Discussed today starting 0FEV, will hold off on this until after completion of overnight oximetry, will try to see if I can get pulmonary function test moved up from October/2020 but this is unlikely due to COVID-19 restrictions   GOLD COPD II B Plan: Continue Breo Ellipta 100 Continue Spiriva Respimat 2.5 Walk today in office Overnight oximetry test ordered  Rheumatoid arthritis ([place]) Plan: Continue follow-up with Dr. Trudie Reed  Abdominal tenderness, RUQ (right upper quadrant) Plan: Continue follow-up with gastroenterology next week If symptoms worsen before the appointment with gastroenterology please present to an emergency room for further evaluation     Return in about 6 weeks (around 04/05/2019), or if symptoms worsen or fail to improve, for Follow up with Dr. Lake Bells, Follow up with [name] FNP-C.   Lauraine Rinne, NP 02/22/2019   This appointment was 45 minutes long with over 50% of the time in direct face-to-face patient care, assessment, plan of care, and follow-up.

## 2019-02-21 NOTE — Progress Notes (Signed)
Subjective:   Chief complaint:   [name], male    DOB: 09-29-1940, 78 y.o.   MRN: [id number]  HPI  78 y.o. male with RA previously on [employer] with recent lumbar surgery with hardware placement complicated by CSF leak and deep infection with pseudomonas.  The patient had ESR, CRP have come down.  I last saw him he was having more significant hip pain than back pain.  He was subsequently seen by Dr. Maureen Ralphs who performed an MRI which showed evidence of gluteal tendon pathology.  There is also a fluid collection which was apparently aspirated and sent for culture which did not reveal any organisms noted she he was on ciprofloxacin when this occurred.  He was taken to the operating room by Dr. Maureen Ralphs who performed right gluteal tendon repair.  Dr. [name] on 07/10/18 performed posterior lumbar interbody fusion, interbody prosthesis L1-2, T9-L5. Op Note reviewed - no purulence or necrotic tissue noted to suggest gross infection. intraop cultures were negative, again he was taking cipro at this time.     He was seen by Janene Madeira, NP  last in December 2019 and requested consideration to stop his Cipro. Since this time he has stopped antibiotics and continued to work with PT with his recovery. He has some "pulling" sensation to the midback sometimes and some pain down the right hamstring but he wonders if this is more due to the therapy. He had no fevers/chills. Pain is stable and has been able to push more with therapy. His incision is well healed and w/o any concerns for infection.   He was seen again in February by Colletta Maryland and was doing well again his inflammatory markers were also reassuring.  He has been scheduled see me now and is now at 6 months of stopping his fluoroquinolone.  He tells me that he does have some back spasms in his lower back but he does not think this is related to his prior infection and feels it is a different type of pain does seem somewhat  related to his gluteal injury.  His main complaints are difficulty breathing which he believes is due to abdominal distention that is being worked up by gastroenterology also has an appoint with pulmonary critical care tomorrow.  Fevers chills nausea or other systemic symptoms.    He was seen again in     Past Medical History:  Diagnosis Date  . Chronic lower back pain   . Complication of anesthesia 01/2017   was in ICU due to breathing complications-  . COPD (chronic obstructive pulmonary disease) ([place])   . Degenerative scoliosis in adult patient   . Depression   . Dyspnea    on exertion  . GERD (gastroesophageal reflux disease)   . H/O seasonal allergies   . High cholesterol   . 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 History of kidney stones   . Hypertension   . 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 OSA on CPAP    wears CPAP  . Pneumonia    "I've had it several times; last time was end of Jan 2018" (05/02/2017)  . Pre-diabetes   . Prostate cancer ([facility] [place])   . Rheumatoid arteritis ([place])    "all over" (05/02/2017)  . Thrush 12/07/2017  . Wears glasses     Past Surgical History:  Procedure Laterality Date  . BACK SURGERY    . COLONOSCOPY W/ POLYPECTOMY    .  FRACTURE SURGERY    . HERNIA REPAIR    . HIP ARTHROPLASTY Left 04/2016  . INGUINAL HERNIA REPAIR Left 1986  . JOINT REPLACEMENT    . LUMBAR WOUND DEBRIDEMENT N/A 04/18/2017   Procedure: REPAIR OF LUMBAR PSEUDOMENINGOCELE;  Surgeon: Newman Pies, MD;  Location: [facility];  Service: Neurosurgery;  Laterality: N/A;  REAIR OF LUMBAR PSEUDOMENINGOCELE  . LUMBAR WOUND DEBRIDEMENT N/A 05/05/2017   Procedure: I&D Lumbar Wound;  Surgeon: Newman Pies, MD;  Location: [facility];  Service: Neurosurgery;  Laterality: N/A;  . MAXIMUM ACCESS (MAS)POSTERIOR LUMBAR INTERBODY FUSION (PLIF) 3 LEVEL  02/17/2017   [name] 02/17/2017  . OPEN SURGICAL REPAIR OF GLUTEAL TENDON  Right 11/02/2017   Procedure: Right hip gluteal tendon repair;  Surgeon: Gaynelle Arabian, MD;  Location: [facility];  Service: Orthopedics;  Laterality: Right;  . POSTERIOR LAMINECTOMY / DECOMPRESSION LUMBAR SPINE  2010  . PROSTATE BIOPSY    . PROSTATE BIOPSY  <2013 X 3  . SPLENECTOMY  1955  . TONSILLECTOMY    . TOTAL HIP ARTHROPLASTY Right 10/2015  . UMBILICAL HERNIA REPAIR  05/2016    Family History  Problem Relation Age of Onset  . Cancer Mother   . Heart disease Father   . Stroke Father       Social History   Socioeconomic History  . Marital status: Married    Spouse name: Not on file  . Number of children: Not on file  . Years of education: Not on file  . Highest education level: Not on file  Occupational History  . Not on file  Social Needs  . Financial resource strain: Not on file  . Food insecurity:    Worry: Not on file    Inability: Not on file  . Transportation needs:    Medical: Not on file    Non-medical: Not on file  Tobacco Use  . Smoking status: Former Smoker    Packs/day: 1.50    Years: 54.00    Pack years: 81.00    Types: Cigarettes    Last attempt to quit: 09/27/2014    Years since quitting: 4.4  . Smokeless tobacco: Never Used  . Tobacco comment: 05/02/2017 "quit vaping 09/2016"  Substance and Sexual Activity  . Alcohol use: Yes    Alcohol/week: 17.0 standard drinks    Types: 7 Cans of beer, 10 Shots of liquor per week    Comment: Daily shots, beer etc  . Drug use: No  . Sexual activity: Not Currently  Lifestyle  . Physical activity:    Days per week: Not on file    Minutes per session: Not on file  . Stress: Not on file  Relationships  . Social connections:    Talks on phone: Not on file    Gets together: Not on file    Attends religious service: Not on file    Active member of club or organization: Not on file    Attends meetings of clubs or organizations: Not on file    Relationship status: Not on file  Other Topics Concern  . Not on file   Social History Narrative  . Not on file    Allergies  Allergen Reactions  . Sulfa Antibiotics Shortness Of Breath  . Sulfasalazine Shortness Of Breath  . Lisinopril Cough  . Varenicline Other (See Comments)    Strange thoughts     Current Outpatient Medications:  .  albuterol (PROVENTIL HFA;VENTOLIN HFA) 108 (90 Base) MCG/ACT inhaler, Inhale 2 puffs into  the lungs every 6 (six) hours as needed for wheezing or shortness of breath., Disp: , Rfl:  .  DULoxetine (CYMBALTA) 60 MG capsule, Take 60 mg by mouth every evening. , Disp: , Rfl:  .  famotidine (PEPCID) 40 MG tablet, Take 1 tablet (40 mg total) by mouth daily., Disp: 30 tablet, Rfl: 6 .  finasteride (PROSCAR) 5 MG tablet, Take 5 mg by mouth at bedtime. , Disp: , Rfl:  .  fluticasone (FLONASE) 50 MCG/ACT nasal spray, Place 2 sprays into both nostrils daily as needed for allergies. , Disp: , Rfl:  .  fluticasone furoate-vilanterol (BREO ELLIPTA) 100-25 MCG/INH AEPB, Inhale 1 puff into the lungs daily., Disp: 90 each, Rfl: 1 .  folic acid (FOLVITE) 1 MG tablet, Take 1 mg by mouth daily., Disp: , Rfl:  .  gabapentin (NEURONTIN) 300 MG capsule, Take 600 mg by mouth 3 (three) times daily., Disp: , Rfl:  .  levETIRAcetam (KEPPRA) 1000 MG tablet, Take 1,000 mg by mouth 2 (two) times daily., Disp: , Rfl:  .  losartan (COZAAR) 25 MG tablet, Take 1 tablet (25 mg total) by mouth daily., Disp: 90 tablet, Rfl: 3 .  methotrexate (RHEUMATREX) 2.5 MG tablet, Take 2.5 mg by mouth once a week. Caution:Chemotherapy. Protect from light., Disp: , Rfl:  .  predniSONE (DELTASONE) 5 MG tablet, Take 5 mg by mouth daily with breakfast., Disp: , Rfl:  .  simvastatin (ZOCOR) 40 MG tablet, Take 40 mg by mouth at bedtime. , Disp: , Rfl:  .  sodium chloride (OCEAN) 0.65 % SOLN nasal spray, Place 1 spray into both nostrils as needed for congestion., Disp: , Rfl:  .  tamsulosin (FLOMAX) 0.4 MG CAPS capsule, Take 0.4 mg by mouth at bedtime. , Disp: , Rfl:  .   Tiotropium Bromide Monohydrate (SPIRIVA RESPIMAT) 2.5 MCG/ACT AERS, Inhale 2 puffs into the lungs daily., Disp: 3 Inhaler, Rfl: 3 .  traZODone (DESYREL) 50 MG tablet, Take 50 mg by mouth at bedtime., Disp: , Rfl:  .  metoprolol succinate (TOPROL-XL) 25 MG 24 hr tablet, Take 1 tablet (25 mg total) by mouth 2 (two) times daily. Take with or immediately following a meal., Disp: 180 tablet, Rfl: 3     Review of Systems  Constitutional: Negative for chills, diaphoresis and fever.  HENT: Negative for congestion, hearing loss and tinnitus.   Respiratory: Positive for shortness of breath. Negative for cough and wheezing.   Cardiovascular: Negative for chest pain, palpitations and leg swelling.  Gastrointestinal: Positive for abdominal distention. Negative for abdominal pain, blood in stool, constipation, diarrhea, nausea and vomiting.  Genitourinary: Negative for dysuria, flank pain and hematuria.  Musculoskeletal: Positive for arthralgias, back pain and myalgias.  Skin: Negative for rash.  Neurological: Negative for dizziness, weakness and headaches.  Hematological: Does not bruise/bleed easily.  Psychiatric/Behavioral: Negative for suicidal ideas. The patient is not nervous/anxious.        Objective:   Physical Exam  Constitutional: He is oriented to person, place, and time. He appears well-developed and well-nourished. No distress.  HENT:  Head: Normocephalic and atraumatic.  Eyes: Conjunctivae and EOM are normal. No scleral icterus.  Neck: Normal range of motion. Neck supple.  Cardiovascular: Normal rate and regular rhythm.  Pulmonary/Chest: Effort normal. No respiratory distress. He has no wheezes.  Abdominal: He exhibits distension.  Musculoskeletal:        General: No tenderness or edema.  Neurological: He is alert and oriented to person, place, and time. He exhibits  normal muscle tone. Coordination normal.  Skin: Skin is warm and dry. No rash noted. He is not diaphoretic. No  erythema. No pallor.  Psychiatric: His behavior is normal. Judgment and thought content normal. He exhibits a depressed mood.          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  Overall I am reassured and feel that he can come in follow-up with [place] in 6 months time.   RA: he is seeing Dr. Trudie Reed.  And remains on methotrexate along with prednisone and is avoiding TNF alpha blockers.  Remain concerned about initiating such drugs in him but eventually we get further out from his antibiotics being stopped that they could be contemplated at something that could be trialed again though I would always have a great deal of hesitation from an infectious disease standpoint  Gluteal tendon tear: This was thought to be due to context of him having had a hip replacement surgery and potential damage to the tendon at that time.

## 2019-02-22 ENCOUNTER — Ambulatory Visit (INDEPENDENT_AMBULATORY_CARE_PROVIDER_SITE_OTHER): Payer: Medicare Other | Admitting: Pulmonary Disease

## 2019-02-22 ENCOUNTER — Encounter: Payer: Self-pay | Admitting: Pulmonary Disease

## 2019-02-22 VITALS — Temp 97.8°F | Ht 68.0 in | Wt 245.8 lb

## 2019-02-22 DIAGNOSIS — J849 Interstitial pulmonary disease, unspecified: Secondary | ICD-10-CM | POA: Diagnosis not present

## 2019-02-22 DIAGNOSIS — M05741 Rheumatoid arthritis with rheumatoid factor of right hand without organ or systems involvement: Secondary | ICD-10-CM

## 2019-02-22 DIAGNOSIS — G4733 Obstructive sleep apnea (adult) (pediatric): Secondary | ICD-10-CM | POA: Diagnosis not present

## 2019-02-22 DIAGNOSIS — M05742 Rheumatoid arthritis with rheumatoid factor of left hand without organ or systems involvement: Secondary | ICD-10-CM

## 2019-02-22 DIAGNOSIS — R10811 Right upper quadrant abdominal tenderness: Secondary | ICD-10-CM | POA: Insufficient documentation

## 2019-02-22 DIAGNOSIS — J42 Unspecified chronic bronchitis: Secondary | ICD-10-CM

## 2019-02-22 DIAGNOSIS — Z9989 Dependence on other enabling machines and devices: Secondary | ICD-10-CM

## 2019-02-22 LAB — CBC WITH DIFFERENTIAL/PLATELET
Absolute Monocytes: 1288 cells/uL — ABNORMAL HIGH (ref 200–950)
Basophils Absolute: 38 cells/uL (ref 0–200)
Basophils Relative: 0.3 %
Eosinophils Absolute: 113 cells/uL (ref 15–500)
Eosinophils Relative: 0.9 %
HCT: 41.6 % (ref 38.5–50.0)
Hemoglobin: 14 g/dL (ref 13.2–17.1)
Lymphs Abs: 1550 cells/uL (ref 850–3900)
MCH: 34.8 pg — ABNORMAL HIGH (ref 27.0–33.0)
MCHC: 33.7 g/dL (ref 32.0–36.0)
MCV: 103.5 fL — ABNORMAL HIGH (ref 80.0–100.0)
MPV: 9.4 fL (ref 7.5–12.5)
Monocytes Relative: 10.3 %
Neutro Abs: 9513 cells/uL — ABNORMAL HIGH (ref 1500–7800)
Neutrophils Relative %: 76.1 %
Platelets: 332 10*3/uL (ref 140–400)
RBC: 4.02 10*6/uL — ABNORMAL LOW (ref 4.20–5.80)
RDW: 13.9 % (ref 11.0–15.0)
Total Lymphocyte: 12.4 %
WBC: 12.5 10*3/uL — ABNORMAL HIGH (ref 3.8–10.8)

## 2019-02-22 LAB — BASIC METABOLIC PANEL WITH GFR
BUN: 17 mg/dL (ref 7–25)
CO2: 29 mmol/L (ref 20–32)
Calcium: 9.1 mg/dL (ref 8.6–10.3)
Chloride: 100 mmol/L (ref 98–110)
Creat: 0.94 mg/dL (ref 0.70–1.18)
GFR, Est African American: 91 mL/min/{1.73_m2} (ref >=60)
GFR, Est Non African American: 78 mL/min/{1.73_m2} (ref >=60)
Glucose, Bld: 124 mg/dL — ABNORMAL HIGH (ref 65–99)
Potassium: 4.2 mmol/L (ref 3.5–5.3)
Sodium: 139 mmol/L (ref 135–146)

## 2019-02-22 LAB — SEDIMENTATION RATE: Sed Rate: 19 mm/h (ref 0–20)

## 2019-02-22 LAB — C-REACTIVE PROTEIN: CRP: 11.2 mg/L — ABNORMAL HIGH (ref <8.0)

## 2019-02-22 NOTE — Assessment & Plan Note (Signed)
Assessment: Excellent CPAP compliance Well-controlled AHI on CPAP compliance report today 2001 sleep study shows an AHI of 78  Plan: Continue CPAP therapy We will order overnight oximetry test to evaluate oxygen needs with CPAP on

## 2019-02-22 NOTE — Assessment & Plan Note (Signed)
Plan: Continue Breo Ellipta 100 Continue Spiriva Respimat 2.5 Walk today in office Overnight oximetry test ordered

## 2019-02-22 NOTE — Assessment & Plan Note (Signed)
Plan: Continue follow-up with Dr. Hawkes 

## 2019-02-22 NOTE — Assessment & Plan Note (Addendum)
Plan: Walk in office today  >>> Oxygen desaturations did occur will start 2L O2 with exertion  Overnight Oximetry ordered today  DME: [facility] On room air with CPAP  Continue methotrexate Continue prednisone We will repeat a lung function test in October 2020 We will consider whether or not you need to be on a drug to slow the progression of rheumatoid arthritis related fibrotic disease after the next lung function test later this year. >>> Discussed today starting 0FEV, will hold off on this until after completion of overnight oximetry, will try to see if I can get pulmonary function test moved up from October/2020 but this is unlikely due to COVID-19 restrictions

## 2019-02-22 NOTE — Patient Instructions (Addendum)
Walk in office today   Overnight Oximetry ordered today  DME: [facility] On room air with CPAP  Please contact our office immediately after you complete your overnight oximetry test so that way we can request these results from your DME company.  We have 30 days from the completion of her overnight oximetry test to be able to start you on oxygen if you have desaturations when you sleep.  Continue phenylephrine Continue saline sprays Continue Flonase Continue Mucinex  OSA Continue CPAP  We recommend that you continue using your CPAP daily >>>Keep up the hard work using your device >>> Goal should be wearing this for the entire night that you are sleeping, at least 4 to 6 hours  Remember:  . Do not drive or operate heavy machinery if tired or drowsy.  . Please notify the supply company and office if you are unable to use your device regularly due to missing supplies or machine being broken.  . Work on maintaining a healthy weight and following your recommended nutrition plan  . Maintain proper daily exercise and movement  . Maintaining proper use of your device can also help improve management of other chronic illnesses such as: Blood pressure, blood sugars, and weight management.   BiPAP/ CPAP Cleaning:  >>>Clean weekly, with Dawn soap, and bottle brush.  Set up to air dry.    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    Return in about 6 weeks (around 04/05/2019), or if symptoms worsen or fail to improve, for Follow up with Dr. Lake Bells, Follow up with [name] FNP-C.     Coronavirus (COVID-19) Are you at risk?  Are you at risk for the Coronavirus (COVID-19)?  To be considered HIGH RISK for Coronavirus (COVID-19), you have to meet the following criteria:  . Traveled to [place], [place], [place], [place] or [place]; or in the [place] to [place], [place], [place], or [place]; and have fever, cough, and shortness of breath within the last 2 weeks of travel OR . Been in close contact with a person diagnosed with COVID-19 within the last 2 weeks and have fever, cough, and shortness of breath . IF YOU DO NOT MEET THESE CRITERIA, YOU ARE CONSIDERED LOW RISK FOR COVID-19.  What to do if you are HIGH RISK for COVID-19?  Marland Kitchen If you are having a medical emergency, call 911. . Seek medical care right away. Before you go to a doctor's office, urgent care or emergency department, call ahead and tell them about your recent travel, contact with someone diagnosed with COVID-19, and your symptoms.  You should receive instructions from your physician's office regarding next steps of care.  . When you arrive at healthcare provider, tell the healthcare staff immediately you have returned from visiting [place], [place], [place], [place] or [place]; or traveled in the [place] to [place], [place], [place], or [place]; in the last two weeks or you have been in close contact with a person diagnosed with COVID-19 in the last 2 weeks.   . Tell the health care staff about your symptoms:  fever, cough and shortness of breath. . After you have been seen by a medical provider, you will be either: o Tested for (COVID-19) and discharged home on quarantine except to seek medical care if symptoms worsen, and asked to  - Stay home and avoid contact with others until you get your results (4-5 days)  - Avoid travel on public transportation if possible (such as bus, train, or airplane) or o Sent to the Emergency Department by EMS for evaluation, COVID-19 testing, and possible admission depending on your condition and test results.  What to do if you are LOW RISK for COVID-19?  Reduce your risk of any infection by using the same precautions used for avoiding the common cold or flu:  Marland Kitchen Wash your hands often with soap and warm water for at least 20 seconds.  If soap and water are not readily available, use an alcohol-based hand sanitizer with at least 60% alcohol.  . If coughing or sneezing, cover your mouth and nose by coughing or sneezing into the elbow areas of your shirt or coat, into a tissue or into your sleeve (not your hands). . Avoid shaking hands with others and consider head nods or verbal greetings only. . Avoid touching your eyes, nose, or mouth with unwashed hands.  . Avoid close contact with people who are sick. . Avoid places or events with large numbers of people in one location, like concerts or sporting events. . Carefully consider travel plans you have or are making. . If you are planning any travel outside or inside the [place], visit the CDC's Travelers' Health webpage for the latest health notices. . If you have some symptoms but not all symptoms, continue to monitor at home and seek medical attention if your symptoms worsen. . If you are having a medical emergency, call 911.   [place] / e-Visit: [email or web address]         [facility]: [place]  [facility]: [phone]                   [facility]: [phone]           It is flu season:   >>> Best ways to protect herself from the flu: Receive the yearly flu vaccine, practice good hand hygiene washing with soap and also using hand sanitizer when available, eat a nutritious meals, get adequate rest, hydrate appropriately   Please contact the office if your symptoms worsen or you have concerns that you are not improving.   Thank you for choosing [facility] for your healthcare, and for allowing [place] to partner with you on your healthcare journey. I am thankful to be able to provide care to you today.   [name] FNP-C

## 2019-02-22 NOTE — Assessment & Plan Note (Signed)
Plan: Continue follow-up with gastroenterology next week If symptoms worsen before the appointment with gastroenterology please present to an emergency room for further evaluation

## 2019-02-25 NOTE — Progress Notes (Signed)
Reviewed, agree

## 2019-02-26 ENCOUNTER — Encounter: Payer: Self-pay | Admitting: Acute Care

## 2019-02-27 ENCOUNTER — Telehealth: Payer: Self-pay | Admitting: Pulmonary Disease

## 2019-02-27 ENCOUNTER — Other Ambulatory Visit: Payer: Self-pay | Admitting: Acute Care

## 2019-02-27 DIAGNOSIS — Z87891 Personal history of nicotine dependence: Secondary | ICD-10-CM

## 2019-02-27 DIAGNOSIS — Z122 Encounter for screening for malignant neoplasm of respiratory organs: Secondary | ICD-10-CM

## 2019-02-27 NOTE — Telephone Encounter (Signed)
See 02/27/19 orders only encounter Will sign off

## 2019-02-27 NOTE — Telephone Encounter (Signed)
His inflammatory markers not easy to interpret given he has comorbid RA  His ESR was up slightly though in the normal range  CRP has increased  He can reschedule with [place] sooner if he likes than 6 months I wouldn't rush to reimage his back or restart antibiotics yet

## 2019-02-27 NOTE — Telephone Encounter (Signed)
Called James @ [facility]  Apparently they were not able to see the patient care note which has pts saturations on RA while sitting, while ambulating and when placed on 02.  Made [name] aware it is in there. He will get with is his office and call back with update. They should be able to get patient set up today.

## 2019-02-28 NOTE — Telephone Encounter (Signed)
Called and spoke with [name] at [facility]. Pt is being set-up today @ 2pm. Nothing further needed.

## 2019-03-01 ENCOUNTER — Telehealth: Payer: Self-pay

## 2019-03-01 NOTE — Telephone Encounter (Signed)
Received e-mail from patient inquiring about the status of his pulse oximetry test. Call made to [facility], left message they will be calling back with status of overnight pulse oximetry order. Will await call back. Will route to triage so that message can be addressed in left messages if they do not call back today.

## 2019-03-05 ENCOUNTER — Ambulatory Visit (INDEPENDENT_AMBULATORY_CARE_PROVIDER_SITE_OTHER): Payer: Medicare Other | Admitting: Infectious Disease

## 2019-03-05 ENCOUNTER — Encounter: Payer: Self-pay | Admitting: Infectious Disease

## 2019-03-05 ENCOUNTER — Other Ambulatory Visit: Payer: Self-pay

## 2019-03-05 DIAGNOSIS — Z9081 Acquired absence of spleen: Secondary | ICD-10-CM

## 2019-03-05 DIAGNOSIS — M0579 Rheumatoid arthritis with rheumatoid factor of multiple sites without organ or systems involvement: Secondary | ICD-10-CM | POA: Diagnosis not present

## 2019-03-05 DIAGNOSIS — J849 Interstitial pulmonary disease, unspecified: Secondary | ICD-10-CM | POA: Diagnosis not present

## 2019-03-05 DIAGNOSIS — T847XXA Infection and inflammatory reaction due to other internal orthopedic prosthetic devices, implants and grafts, initial encounter: Secondary | ICD-10-CM

## 2019-03-05 DIAGNOSIS — M4620 Osteomyelitis of vertebra, site unspecified: Secondary | ICD-10-CM

## 2019-03-05 NOTE — Progress Notes (Signed)
Virtual Visit via Telephone Note  I connected with [name] on 03/05/19 at  4:00 PM EDT by telephone and verified that I am speaking with the correct person using two identifiers.  Location: Patient: Home Provider: [facility]   I discussed the limitations, risks, security and privacy concerns of performing an evaluation and management service by telephone and the availability of in person appointments. I also discussed with the patient that there may be a patient responsible charge related to this service. The patient expressed understanding and agreed to proceed.   History of Present Illness:   78 y.o.malewith RA previously on [employer] with recent lumbar surgery with hardware placement complicated by CSF leak and deep infection with pseudomonas.  The patient had ESR, CRP have come down.  I last saw him he was having more significant hip pain than back pain.  He was subsequently seen by Dr. Maureen Ralphs who performed an MRI which showed evidence of gluteal tendon pathology.  There is also a fluid collection which was apparently aspirated and sent for culture which did not reveal any organisms noted she he was on ciprofloxacin when this occurred.  He was taken to the operating room by Dr. Maureen Ralphs who performed right gluteal tendon repair.  Dr. [name] on 07/10/18 performed posterior lumbar interbody fusion, interbody prosthesis L1-2, T9-L5. Op Note reviewed - no purulence or necrotic tissue noted to suggest gross infection. intraop cultures were negative, again he was taking cipro at this time.   He was seen by Janene Madeira, NP  last in December 2019 and requested consideration to stop his Cipro. Since this time hehas stopped antibiotics and continued to work with PT with his recovery. He has some "pulling" sensation to the midback sometimes and some pain down the right hamstring but he wonders if this is more due to the therapy. He had no fevers/chills. Pain is stable and has been able  to push more with therapy. His incision is well healed and w/o any concerns for infection.  He was seen again in February by Colletta Maryland and was doing well again his inflammatory markers were also reassuring.  He has been scheduled see me now and is now at 6 months of stopping his fluoroquinolone.  He tells me that he does have some back spasms in his lower back but he does not think this is related to his prior infection and feels it is a different type of pain does seem somewhat related to his gluteal injury.  When I saw him we continued to elect to observe him off of antibiotics.  We checked labs and his CRP was dramatically elevated compared to past CRP lab values although his sed rate was still in the normal range at 18.  In talking to him over the phone today we connected does not sound like he has worsening lower back pain beyond the part that he attributes to musculoskeletal problem.  He has an upcoming appointment with neurosurgery and will have plain films with them and an evaluation.  I think it is prudent to continue to watch him off antibiotics for now and told him I did not think we should overreact to just 1 laboratory value which is just one data point.  Again also to keep in mind that his inflammatory markers are difficult to interpret in light of his rheumatoid arthritis.    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  Status post bunionectomy make sure he is up-to-date on his vaccines  Follow Up Instructions:    I discussed the assessment and treatment plan with the patient. The patient was provided an  opportunity to ask questions and all were answered. The patient agreed with the plan and demonstrated an understanding of the instructions.   The patient was advised to call back or seek an in-person evaluation if the symptoms worsen or if the condition fails to improve as anticipated.  I provided 15 minutes of non-face-to-face time during this encounter.   Alcide Evener, MD

## 2019-03-06 NOTE — Telephone Encounter (Signed)
[name] returned phone call, he states they will be reaching out to you this week. Call made to patient to make aware. Voiced understanding. Nothing further is needed at this time.

## 2019-03-06 NOTE — Telephone Encounter (Signed)
Call made to [facility] left message with [name] with [facility]. Will await a call back.

## 2019-03-06 NOTE — Telephone Encounter (Signed)
Attempted to contact [facility], they are closed between 12-1 for lunch. Will attempt to contact later.

## 2019-03-19 ENCOUNTER — Telehealth: Payer: Self-pay | Admitting: Pulmonary Disease

## 2019-03-19 DIAGNOSIS — J849 Interstitial pulmonary disease, unspecified: Secondary | ICD-10-CM

## 2019-03-19 NOTE — Telephone Encounter (Signed)
Received call from [place] and spoke with Caryl Pina, RT. Caryl Pina states the pt had the ONO for two nights, the first night was on room air and the second night was on O2. Caryl Pina states she believes the pt was on 2lpm, 'what he normally wears'.   Called and spoke to pt he confirmed the two nights on RA then on O2, pt also states he thinks he was on 2lpm on the second night.   [name] please advise if the 2lpm is adequate. Thanks.

## 2019-03-19 NOTE — Telephone Encounter (Signed)
LMTCB for [facility] to confirm the tests duration is correct. There is an email in pts chart, pt is inquiring about results. I will update the pt via email as he initiated the mode of communication with email.

## 2019-03-19 NOTE — Telephone Encounter (Signed)
03/19/2019 1415  03/13/2019-overnight oximetry test- duration 22 hours 11 minutes and 6 seconds, total time below 88% was 5 hours 29 minutes and 21 seconds, qualifying for o2  Can we please contact [facility] and confirm that they have the correct duration of time as this is stating that the patient slept for 22 hours and 11 minutes.  Please notify the patient that we have received the results were following up with [facility] regarding their accuracy.  Most likely it looks like the patient will be started on oxygen therapy 2 L at night.  [name], FNP

## 2019-03-19 NOTE — Telephone Encounter (Signed)
Difficult to fully say as it does not appear that the overnight oximetry test was printed and coordinated correctly.  For right now I would go ahead and place the patient on 2 L of O2 at night.  We can repeat an overnight oximetry test with the patient on 2 L to make sure this is sufficient oxygen coverage.  Please place the order for oxygen at night 2 L.  Please place the order for an overnight oximetry test to be completed on 2 L.  [name], FNP

## 2019-03-20 NOTE — Telephone Encounter (Signed)
Attempted to call.  No answer then disconnected.  Will need to attempt again.

## 2019-03-21 NOTE — Telephone Encounter (Signed)
Pt states he stays in bed 11 hrs or greater. Pt states he put device on at 7:30 pm and wore until next morning both times. He is aware new order placed to repeat ONO on 2 liters. Order was placed on 6/23 Order for 2 liter 02 placed as well DME: [facility]. Nothing further needed at this time.

## 2019-03-26 ENCOUNTER — Telehealth: Payer: Self-pay | Admitting: Pulmonary Disease

## 2019-03-26 NOTE — Telephone Encounter (Signed)
03/26/2019 1000  Lauren, please contact the patient and let him know that his overnight oximetry results are still showing that he is having episodes of hypoxemia at night.  We will need to increase his O2 to 3 L O2 at night.  Please make the changes.  03/22/2019- overnight oximetry- on 2 L - duration of sleep 11 hours and 3 minutes, total time below 89% 1 hour and 4 minutes and 19 seconds, total time below 88% 21 minutes and 39 seconds  [name] FNP

## 2019-03-26 NOTE — Telephone Encounter (Signed)
ATC patient, his phone rang 15 times, no answer will attempt again tomorrow

## 2019-03-30 DIAGNOSIS — R0902 Hypoxemia: Secondary | ICD-10-CM

## 2019-04-02 NOTE — Telephone Encounter (Signed)
[name], pt has sent the following MyChart message:  "Since we met last I have completed three nights of oximetry testing. The most recent test was 8 days ago and I have had no information about results.  The ones reported on the 16th of June either did not yield correct results or something.  I have an appointment coming up soon and hope to learn what these tests revealed. I am using oxygen every night and am also having significant breathing problems when outside in the heat...so, yes, I avoid that as much as possible. Looking forward to ideas, Michael [facility]) Buchanan"  I am unable to locate an ONO in pt's chart at this time. It appears the pt had one done through [facility], according to referral note placed 03/20/2019. Have you received any results from [facility]? Please advise. Thank you.

## 2019-04-02 NOTE — Telephone Encounter (Signed)
Noted. Thanks.   According to that telephone note from 03/26/2019, pt received his results already. I will send a MyChart message to him as a reminder and place an order to [facility] to ensure pt's O2 is increased to 3L QHS based off his 03/22/2019 ONO results.    Order has been placed. MyChart message has been sent. Nothing further needed at this time.

## 2019-04-02 NOTE — Telephone Encounter (Signed)
Called and spoke with patient and went over his ONO results. I made him aware that it shows he is still having episodes during the night and [name] would like to increase his O2 to 3L at night. He verbalized understanding and he states that he will keep his appointment this week.

## 2019-04-02 NOTE — Telephone Encounter (Signed)
04/02/2019 1247  Triage, please see note from 03/26/2019.  We have tried to contact the patient regarding these results.  It is very difficult to get a hold of the patient.  My message from that telephone encounter is listed below:  03/26/2019 1000  Lauren, please contact the patient and let him know that his overnight oximetry results are still showing that he is having episodes of hypoxemia at night.  We will need to increase his O2 to 3 L O2 at night.  Please make the changes.  03/22/2019- overnight oximetry- on 2 L - duration of sleep 11 hours and 3 minutes, total time below 89% 1 hour and 4 minutes and 19 seconds, total time below 88% 21 minutes and 39 seconds  [name] FNP    Please contact the patient to let him know.  Please also send this is a response via mychart as this seems to be the patient's preferred form of communication.   Please also make sure his DME company is aware that he should be using 3 L of O2 at night based off of his 03/22/2019 ono results.   [name] FNP

## 2019-04-04 NOTE — Progress Notes (Signed)
_0  ID: [name], male    DOB: [birth year], 78 y.o.   MRN: [id number]  Chief Complaint  Patient presents with  . Follow-up    ILD, chronic respiratory failure, follow-up    Referring provider: Javier Glazier, MD  HPI:  78 year old male former smoker referred to our office in 2018.  Managed in our office for COPD and obstructive sleep apnea (managed on CPAP).  PMH: Rheumatoid arthritis (currently on methotrexate)-followed by Dr. Trudie Reed Smoker/ Smoking History: Former Smoker, Quit 2012, 81 pack years Maintenance:  Breo Ellipta 100, Spiriva 2.5 Pt of: Dr. Lake Bells  04/05/2019  - Visit   78 year old male former smoker followed in our office for interstitial lung disease, COPD, and obstructive sleep apnea.  Patient reports that CPAP is being managed well.  CPAP compliance report confirms this:  03/05/2019-04/03/2019-CPAP compliance report-30 had a last 30 days use, all 30 those days greater than 4 hours, average usage 11 hours and 32 minutes, CPAP set pressure of 8, AHI 0.6  Patient presenting today for a follow-up visit.  Since last office visit he is completed overnight oximetry results showing he needs 3 L of O2 at night with CPAP therapy.  We are planning on repeating pulmonary function testing in October/2020.  Patient continues to report baseline shortness of breath and dyspnea.  Patient with lower extremity swelling today.  Patient continues to be adherent to his inhalers as well as rheumatology meds.    Tests:   Pulmonary function testing: >>>October 2015 from [facility] ratio 58% FEV1 1.54 L 57% predicted, FVC 2.65 L 70% predicted, total lung capacity 157% predicted residual volume 263% predicted DLCO 61% predic   07/04/2018-pulmonary function test- FVC 2.64 (71% predicted), ratio 71, FEV1 70, no significant bronchodilator response, DLCO 54  Chest imaging: March 2018 CT chest lung cancer screening: Rads 2, images independently reviewed showing nonspecific scarring  more in the right lung base than the left, not consistent with an interstitial lung disease 12/19/2017-CT chest lung cancer screening-lung RADS 2, follow-up in 12 months 05/30/2018-chest x-ray-stable bilateral interstitial prominence suggesting chronic interstitial lung disease 06/29/2018-CT chest high-res- spectrum of findings compatible with 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 PSG on 04/25/2000 gave an index of 78/hr.  Ever since then he has  been using CPAP at [facility]  03/13/2019-overnight oximetry test- duration 22 hours 11 minutes and 6 seconds, total time below 88% was 5 hours 29 minutes and 21 seconds, qualifying for o2  03/22/2019- overnight oximetry- on 2 L - duration of sleep 11 hours and 3 minutes, total time below 89% 1 hour and 4 minutes and 19 seconds, total time below 88% 21 minutes and 39 seconds  SIX MIN WALK 02/22/2019 11/17/2016  Supplimental Oxygen during Test? (L/min) No No  Tech Comments: walk with pt at slow pace, pt did experience SOB and had to stop multiple times. O2 sat dropped to 86 but quickly recovered with rest. Walk stopped early due to elevevated HR of 154 and c/o slight dizziness. pt walked a moderate pace, did not complete lap 3 d/t back pain.      FENO:  No results found for: NITRICOXIDE  PFT: PFT Results Latest Ref Rng & Units 07/04/2018  FVC-Pre L 2.64  FVC-Predicted Pre % 71  FVC-Post L 2.59  FVC-Predicted Post %  70  Pre FEV1/FVC % % 71  Post FEV1/FCV % % 72  FEV1-Pre L 1.87  FEV1-Predicted Pre % 70  FEV1-Post L 1.86  DLCO UNC% % 52  DLCO COR %Predicted % 78  TLC L 5.26  TLC % Predicted % 81  RV % Predicted % 109    Imaging: No results found.    Specialty Problems      Pulmonary Problems   GOLD  COPD II B    Pulmonary function testing: October 2015 from [facility] ratio 58% FEV1 1.54 L 57% predicted, FVC 2.65 L 70% predicted, total lung capacity 157% predicted residual volume 263% predicted DLCO 61% predicted  07/04/2018-pulmonary function test- FVC 2.64 (71% predicted), ratio 71, FEV1 70, no significant bronchodilator response, DLCO 54       OSA on CPAP    >>>N. PSG on 04/25/2000 gave an index of 78/hr.  Ever since then he has  been using CPAP at [facility]. 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 Sulfa Antibiotics Shortness Of Breath  . Sulfasalazine Shortness Of Breath  . Lisinopril Cough  . Varenicline Other (See Comments)    Strange thoughts    Immunization History  Administered Date(s) Administered  . 19-influenza Whole 07/18/2012, 06/14/2014, 08/12/2015, 06/03/2016  . HiB (PRP-T) 08/23/2017  . Influenza, High Dose Seasonal PF 06/12/2018  . Influenza,inj,Quad PF,6+ Mos 06/28/2017  . Influenza-Unspecified 07/18/2012, 06/14/2014, 08/12/2015, 06/03/2016, 06/28/2017  . Meningococcal B, OMV 08/23/2017, 09/21/2017  . Meningococcal Conjugate 08/23/2017  . Meningococcal Mcv4o 08/23/2017, 10/18/2017  . Pneumococcal Conjugate-13 11/28/2013, 11/28/2013  .  Pneumococcal Polysaccharide-23 12/26/2005, 09/13/2012  . Pneumococcal-Unspecified 12/26/2005, 09/13/2012  . Tdap 11/28/2013, 11/28/2013  . Zoster 06/06/2012, 06/06/2012    Past Medical History:  Diagnosis Date  . Chronic lower back pain   . Complication of anesthesia 01/2017   was in ICU due to breathing complications-  . COPD (chronic obstructive pulmonary disease) ([place])   . Degenerative scoliosis in adult patient   . Depression   . Dyspnea    on exertion  . GERD (gastroesophageal reflux disease)   . H/O seasonal allergies   . High cholesterol   . 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 History of kidney stones   . Hypertension   . 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 OSA on CPAP    wears CPAP  . Pneumonia    "I've had it several times; last time was end of Jan 2018" (05/02/2017)  . Pre-diabetes   . Prostate cancer ([place])   . Rheumatoid arteritis ([place])    "all over" (05/02/2017)  . Thrush 12/07/2017  . Wears glasses     Tobacco History:  Social History   Tobacco Use  Smoking Status Former Smoker  . Packs/day: 1.50  . Years: 54.00  . Pack years: 81.00  . Types: Cigarettes  . Start date: 02/21/1961  . Quit date: 09/27/2014  . Years since quitting: 4.5  Smokeless Tobacco Never Used  Tobacco Comment   05/02/2017 "quit vaping 09/2016"   Counseling given: Yes Comment: 05/02/2017 "quit vaping 09/2016"   Continue to not smoke  Outpatient Encounter Medications as of 04/05/2019  Medication Sig  . albuterol (PROVENTIL HFA;VENTOLIN HFA) 108 (90 Base) MCG/ACT inhaler Inhale 2 puffs into the lungs every 6 (six) hours as needed for wheezing or shortness of breath.  . DULoxetine (CYMBALTA) 60 MG capsule Take 60 mg by mouth every evening.   . finasteride (PROSCAR) 5 MG tablet Take 5 mg by mouth at bedtime.   . fluticasone (FLONASE) 50 MCG/ACT nasal spray Place 2 sprays into both nostrils daily as needed for allergies.   .  fluticasone furoate-vilanterol (BREO ELLIPTA) 100-25 MCG/INH AEPB Inhale 1 puff into the lungs daily.  . folic acid (FOLVITE) 1 MG tablet Take 1 mg by mouth daily.  Marland Kitchen gabapentin (NEURONTIN) 300 MG capsule Take 600 mg by mouth 3 (three) times daily.  Marland Kitchen losartan (COZAAR) 25 MG tablet Take 1 tablet (25 mg total) by mouth daily.  . methotrexate (RHEUMATREX) 2.5 MG tablet Take 2.5 mg by mouth once a week. Caution:Chemotherapy. Protect from light.  . predniSONE (DELTASONE) 5 MG tablet Take 5 mg by mouth daily with breakfast.  . simvastatin (ZOCOR) 40 MG tablet Take 40 mg by mouth at bedtime.   . sodium chloride (OCEAN) 0.65 % SOLN nasal spray Place 1 spray into both nostrils as needed for congestion.  . tamsulosin (FLOMAX) 0.4 MG CAPS capsule Take 0.4 mg by mouth at bedtime.   . Tiotropium Bromide Monohydrate (SPIRIVA RESPIMAT) 2.5 MCG/ACT AERS Inhale 2 puffs into the lungs daily.  . traZODone (DESYREL) 50 MG tablet Take 50 mg by mouth at bedtime.  . famotidine (PEPCID) 40 MG tablet Take 1 tablet (40 mg total) by mouth daily. (Patient not taking: Reported on 04/05/2019)  . furosemide (LASIX) 20 MG tablet Take 1 tablet (24m total) daily for next 7 days for fluid management  . metoprolol succinate (TOPROL-XL) 25 MG 24 hr tablet Take 1 tablet (25 mg total) by mouth 2 (two) times daily. Take with or immediately following a meal.   No facility-administered encounter medications on file as of 04/05/2019.      Review of Systems  Review of Systems  Constitutional: Positive for fatigue. Negative for activity change, chills, fever and unexpected weight change.  HENT: Negative for postnasal drip, rhinorrhea, sinus pressure, sinus pain and sore throat.   Eyes: Negative.   Respiratory: Positive for shortness of breath. Negative for cough and wheezing.   Cardiovascular: Positive for leg swelling. Negative for chest pain and palpitations.  Gastrointestinal: Negative for diarrhea, nausea and vomiting.  Endocrine:  Negative.   Genitourinary: Negative.   Musculoskeletal: Negative.   Skin: Negative.   Neurological: Negative for dizziness and headaches.  Psychiatric/Behavioral: Negative.  Negative for dysphoric mood. The patient is not nervous/anxious.   All other systems reviewed and are negative.    Physical Exam  BP 118/78 (BP Location: Left Arm, Patient Position: Sitting, Cuff Size: Normal)   Pulse 85   Temp 97.7 F (36.5 C) (Oral)   Ht _0  (1.727 m)   Wt 244 lb (110.7 kg)   SpO2 94%   BMI  37.10 kg/m   Wt Readings from Last 5 Encounters:  04/05/19 244 lb (110.7 kg)  02/22/19 245 lb 12.8 oz (111.5 kg)  02/21/19 245 lb (111.1 kg)  11/16/18 236 lb (107 kg)  11/01/18 234 lb (106.1 kg)   Weight is increasing  Physical Exam  Constitutional: He is oriented to person, place, and time and well-developed, well-nourished, and in no distress. No distress.  HENT:  Head: Normocephalic and atraumatic.  Right Ear: Hearing, tympanic membrane, external ear and ear canal normal.  Left Ear: Hearing, tympanic membrane, external ear and ear canal normal.  Nose: Mucosal edema and rhinorrhea present.  Mouth/Throat: Uvula is midline and oropharynx is clear and moist. No oropharyngeal exudate.  Eyes: Pupils are equal, round, and reactive to light.  Neck: Normal range of motion. Neck supple.  Cardiovascular: Normal rate, regular rhythm and normal heart sounds.  Pulmonary/Chest: Effort normal. No accessory muscle usage. No respiratory distress. He has no decreased breath sounds. He has no wheezes. He has no rhonchi. He has rales (Right greater than left, basilar).  Abdominal: Soft. Bowel sounds are normal. He exhibits no distension. There is no abdominal tenderness.  Musculoskeletal: Normal range of motion.        General: Edema (2-3+ lower extremity edema bilaterally) present.  Lymphadenopathy:    He has no cervical adenopathy.  Neurological: He is alert and oriented to person, place, and time. Gait  normal.  Tolerated walk in office  Skin: Skin is warm and dry. He is not diaphoretic. No erythema.  Psychiatric: Mood, memory, affect and judgment normal.  Nursing note and vitals reviewed.     Lab Results:  CBC    Component Value Date/Time   WBC 12.5 (H) 02/21/2019 1125   RBC 4.02 (L) 02/21/2019 1125   HGB 14.0 02/21/2019 1125   HCT 41.6 02/21/2019 1125   PLT 332 02/21/2019 1125   MCV 103.5 (H) 02/21/2019 1125   MCH 34.8 (H) 02/21/2019 1125   MCHC 33.7 02/21/2019 1125   RDW 13.9 02/21/2019 1125   LYMPHSABS 1,550 02/21/2019 1125   EOSABS 113 02/21/2019 1125   BASOSABS 38 02/21/2019 1125    BMET    Component Value Date/Time   NA 139 02/21/2019 1125   K 4.2 02/21/2019 1125   CL 100 02/21/2019 1125   CO2 29 02/21/2019 1125   GLUCOSE 124 (H) 02/21/2019 1125   BUN 17 02/21/2019 1125   CREATININE 0.94 02/21/2019 1125   CALCIUM 9.1 02/21/2019 1125   GFRNONAA 78 02/21/2019 1125   GFRAA 91 02/21/2019 1125    BNP    Component Value Date/Time   BNP 120.0 (H) 07/13/2018 1858    ProBNP No results found for: PROBNP    Assessment & Plan:   OSA on CPAP Assessment: Excellent CPAP compliance Well-controlled AHI on CPAP compliance report today 2001 sleep study shows AHI of 78  Plan: Continue CPAP therapy with 3 L of O2  ILD (interstitial lung disease) ([place]) Plan: 2 L of O2 with exertion, qualifies for POC Continue CPAP therapy with 3 L bled in Continue methotrexate and prednisone under management of rheumatology We will try to move up scheduled pulmonary function testing from October/2020 to an earlier date May need to consider anti-fibrotic's in the future I will discuss with Dr. Lake Bells to see if he would like 1 of our ILD specialist to evaluate you If pulmonary function test shows worsened restriction may need to consider repeat high-resolution CT chest  GOLD COPD II B Plan: Continue  Breo Ellipta 100 Continue Spiriva Respimat 2.5 Walk today in  office   Rheumatoid arthritis ([place]) Plan: Continue follow-up with Dr. 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, WHO group 3 based off of ILD, COPD and severe obstructive sleep apnea Not currently on a diuretic  Plan: Lab work today Trial of Lasix 20 mg daily for next 7 days Low threshold for continuing chronic diuretics for management of lower extremity swelling Patient to start weighing himself daily in the morning and recording the results Keep close follow-up with primary care this month for an annual physical and repeat blood work We will work to see if we can get pulmonary function test moved up from October/2020, we may be able to accommodate this    Return in about 6 weeks (around 05/17/2019), or if symptoms worsen or fail to improve, for Follow up with [name] FNP-C, Follow up with Dr. Lake Bells.   Lauraine Rinne, NP 04/05/2019   This appointment was 42 minutes long with over 50% of the time in direct face-to-face patient care, assessment, plan of care, and follow-up.

## 2019-04-05 ENCOUNTER — Ambulatory Visit: Payer: Medicare Other | Admitting: Pulmonary Disease

## 2019-04-05 ENCOUNTER — Encounter: Payer: Self-pay | Admitting: Pulmonary Disease

## 2019-04-05 ENCOUNTER — Other Ambulatory Visit: Payer: Self-pay

## 2019-04-05 VITALS — BP 118/78 | HR 85 | Temp 97.7°F | Ht 68.0 in | Wt 244.0 lb

## 2019-04-05 DIAGNOSIS — G4733 Obstructive sleep apnea (adult) (pediatric): Secondary | ICD-10-CM | POA: Diagnosis not present

## 2019-04-05 DIAGNOSIS — J849 Interstitial pulmonary disease, unspecified: Secondary | ICD-10-CM | POA: Diagnosis not present

## 2019-04-05 DIAGNOSIS — Z9989 Dependence on other enabling machines and devices: Secondary | ICD-10-CM

## 2019-04-05 DIAGNOSIS — M05742 Rheumatoid arthritis with rheumatoid factor of left hand without organ or systems involvement: Secondary | ICD-10-CM

## 2019-04-05 DIAGNOSIS — R0609 Other forms of dyspnea: Secondary | ICD-10-CM

## 2019-04-05 DIAGNOSIS — R0602 Shortness of breath: Secondary | ICD-10-CM | POA: Diagnosis not present

## 2019-04-05 DIAGNOSIS — J42 Unspecified chronic bronchitis: Secondary | ICD-10-CM

## 2019-04-05 DIAGNOSIS — M05741 Rheumatoid arthritis with rheumatoid factor of right hand without organ or systems involvement: Secondary | ICD-10-CM

## 2019-04-05 MED ORDER — FUROSEMIDE 20 MG PO TABS: ORAL_TABLET | ORAL | 0 refills | Status: AC

## 2019-04-05 NOTE — Assessment & Plan Note (Signed)
Plan: Continue follow-up with Dr. Trudie Reed

## 2019-04-05 NOTE — Assessment & Plan Note (Signed)
Assessment: Excellent CPAP compliance Well-controlled AHI on CPAP compliance report today 2001 sleep study shows AHI of 78  Plan: Continue CPAP therapy with 3 L of O2

## 2019-04-05 NOTE — Assessment & Plan Note (Signed)
Plan: Continue Breo Ellipta 100 Continue Spiriva Respimat 2.5 Walk today in office

## 2019-04-05 NOTE — Assessment & Plan Note (Addendum)
Plan: 2 L of O2 with exertion, qualifies for POC Continue CPAP therapy with 3 L bled in Continue methotrexate and prednisone under management of rheumatology We will try to move up scheduled pulmonary function testing from October/2020 to an earlier date May need to consider anti-fibrotic's in the future I will discuss with Dr. Lake Bells to see if he would like 1 of our ILD specialist to evaluate you If pulmonary function test shows worsened restriction may need to consider repeat high-resolution CT chest

## 2019-04-05 NOTE — Patient Instructions (Addendum)
Walk in office today for POC   Labwork today   Trial of Lasix 20 mg daily for next 7 days >>>Take in the morning >>> Contact our office after you complete your 7 days of Lasix and let us know how you are doing >>> Contact primary care or discuss at annual physical that you may need chronic fluid management based off of this trial, you will also need repeat blood work at your annual physical later on this month  Continue phenylephrine Continue saline sprays Continue Flonase Continue Mucinex  OSA Continue CPAP with 3L o2   We recommend that you continue using your CPAP daily >>>Keep up the hard work using your device >>> Goal should be wearing this for the entire night that you are sleeping, at least 4 to 6 hours  Remember:   Do not drive or operate heavy machinery if tired or drowsy.   Please notify the supply company and office if you are unable to use your device regularly due to missing supplies or machine being broken.   Work on maintaining a healthy weight and following your recommended nutrition plan   Maintain proper daily exercise and movement   Maintaining proper use of your device can also help improve management of other chronic illnesses such as: Blood pressure, blood sugars, and weight management.   BiPAP/ CPAP Cleaning:  >>>Clean weekly, with Dawn soap, and bottle brush.  Set up to air dry.    COPD:  Breo Ellipta 100 >>> Take 1 puff daily in the morning right when you wake up >>>Rinse your mouth out after use >>>This is a daily maintenance inhaler, NOT a rescue inhaler >>>Contact our office if you are having difficulties affording or obtaining this medication >>>It is important for you to be able to take this daily and not miss any doses  Spiriva Respimat 2.5 >>> 2 puffs daily >>> Do this every day >>>This is not a rescue inhaler   Keep using albuterol as needed for chest tightness wheezing or shortness of breath Practice good hand  hygiene Stay physically active  Interstitial lung disease related to underlying rheumatoid arthritis: Continue methotrexate Continue prednisone We will repeat a lung function test in October 2020 We will consider whether or not you need to be on a drug to slow the progression of rheumatoid arthritis related fibrotic disease after the next lung function test later this year.     Patient Education for Fluid Overload   Do the following things EVERY DAY:   1. Weigh yourself EVERY morning after you go to the bathroom but before you eat or drink anything. Write this number down in a weight log/diary.    2. Take your medicines as prescribed. If you have concerns about your medications, please call us before you stop taking them.    3. Eat low salt foods-Limit salt (sodium) to 2000 mg per day. This will help prevent your body from holding onto fluid. Read food labels as many processed foods have a lot of sodium, especially canned goods and prepackaged meats. If you would like some assistance choosing low sodium foods, we would be happy to set you up with a nutritionist.   4. Stay as active as you can everyday. Staying active will give you more energy and make your muscles stronger. Start with 5 minutes at a time and work your way up to 30 minutes a day. Break up your activities--do some in the morning and some in the afternoon. Start with 3 days  per week and work your way up to 5 days as you can.  If you have chest pain, feel short of breath, dizzy, or lightheaded, STOP. If you don't feel better after a short rest, call 911. If you do feel better, call the office to let us know you have symptoms with exercise.   5. Limit all fluids for the day to less than 2 liters. Fluid includes all drinks, coffee, juice, ice chips, soup, jello, and all other liquids.     Return in about 6 weeks (around 05/17/2019), or if symptoms worsen or fail to improve, for Follow up with [name] FNP-C, Follow up with  Dr. Lake Bells.   Coronavirus (COVID-19) Are you at risk?  Are you at risk for the Coronavirus (COVID-19)?  To be considered HIGH RISK for Coronavirus (COVID-19), you have to meet the following criteria:  . Traveled to [place], [place], [place], [place] or [place]; or in the [place] to [place], [place], [place], or [place]; and have fever, cough, and shortness of breath within the last 2 weeks of travel OR . Been in close contact with a person diagnosed with COVID-19 within the last 2 weeks and have fever, cough, and shortness of breath . IF YOU DO NOT MEET THESE CRITERIA, YOU ARE CONSIDERED LOW RISK FOR COVID-19.  What to do if you are HIGH RISK for COVID-19?  Marland Kitchen If you are having a medical emergency, call 911. . Seek medical care right away. Before you go to a doctor's office, urgent care or emergency department, call ahead and tell them about your recent travel, contact with someone diagnosed with COVID-19, and your symptoms. You should receive instructions from your physician's office regarding next steps of care.  . When you arrive at healthcare provider, tell the healthcare staff immediately you have returned from visiting [place], [place], [place], [place] or [place]; or traveled in the [place] to [place], [place], [place], or [place]; in the last two weeks or you have been in close contact with a person diagnosed with COVID-19 in the last 2 weeks.   . Tell the health care staff about your symptoms: fever, cough and shortness of breath. . After you have been seen by a medical provider, you will be either: o Tested for (COVID-19) and discharged home on quarantine except to seek medical care if symptoms worsen, and asked to  - Stay home and avoid contact with others until you get your results (4-5 days)  - Avoid travel on public transportation if possible (such as bus, train, or airplane) or o Sent to the Emergency Department by EMS for evaluation, COVID-19 testing,  and possible admission depending on your condition and test results.  What to do if you are LOW RISK for COVID-19?  Reduce your risk of any infection by using the same precautions used for avoiding the common cold or flu:  Marland Kitchen Wash your hands often with soap and warm water for at least 20 seconds.  If soap and water are not readily available, use an alcohol-based hand sanitizer with at least 60% alcohol.  . If coughing or sneezing, cover your mouth and nose by coughing or sneezing into the elbow areas of your shirt or coat, into a tissue or into your sleeve (not your hands). . Avoid shaking hands with others and consider head nods or verbal greetings only. . Avoid touching your eyes, nose, or mouth with unwashed hands.  . Avoid close contact with people  who are sick. . Avoid places or events with large numbers of people in one location, like concerts or sporting events. . Carefully consider travel plans you have or are making. . If you are planning any travel outside or inside the [place], visit the CDC's Travelers' Health webpage for the latest health notices. . If you have some symptoms but not all symptoms, continue to monitor at home and seek medical attention if your symptoms worsen. . If you are having a medical emergency, call 911.   [place] / e-Visit: [email or web address]         [facility]: [facility]: [phone]                   [facility]: [phone]           It is flu season:   >>> Best ways to protect herself from the flu: Receive the yearly flu vaccine, practice good hand hygiene washing with soap and also using hand sanitizer when available, eat a nutritious meals, get adequate rest, hydrate appropriately   Please contact the office if your symptoms worsen or you have concerns that you are not improving.    Thank you for choosing [facility] for your healthcare, and for allowing [place] to partner with you on your healthcare journey. I am thankful to be able to provide care to you today.   [name] FNP-C

## 2019-04-05 NOTE — Assessment & Plan Note (Signed)
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

## 2019-04-06 ENCOUNTER — Other Ambulatory Visit (INDEPENDENT_AMBULATORY_CARE_PROVIDER_SITE_OTHER): Payer: Medicare Other

## 2019-04-06 DIAGNOSIS — R0602 Shortness of breath: Secondary | ICD-10-CM | POA: Diagnosis not present

## 2019-04-06 DIAGNOSIS — R0609 Other forms of dyspnea: Secondary | ICD-10-CM

## 2019-04-06 LAB — CBC WITH DIFFERENTIAL/PLATELET
Basophils Absolute: 0 10*3/uL (ref 0.0–0.1)
Basophils Relative: 0.5 % (ref 0.0–3.0)
Eosinophils Absolute: 0.1 10*3/uL (ref 0.0–0.7)
Eosinophils Relative: 0.8 % (ref 0.0–5.0)
HCT: 40.4 % (ref 39.0–52.0)
Hemoglobin: 13.4 g/dL (ref 13.0–17.0)
Lymphocytes Relative: 21.7 % (ref 12.0–46.0)
Lymphs Abs: 2.2 10*3/uL (ref 0.7–4.0)
MCHC: 33.3 g/dL (ref 30.0–36.0)
MCV: 108.7 fl — ABNORMAL HIGH (ref 78.0–100.0)
Monocytes Absolute: 1.6 10*3/uL — ABNORMAL HIGH (ref 0.1–1.0)
Monocytes Relative: 15.5 % — ABNORMAL HIGH (ref 3.0–12.0)
Neutro Abs: 6.2 10*3/uL (ref 1.4–7.7)
Neutrophils Relative %: 61.5 % (ref 43.0–77.0)
Platelets: 265 10*3/uL (ref 150.0–400.0)
RBC: 3.72 Mil/uL — ABNORMAL LOW (ref 4.22–5.81)
RDW: 14.6 % (ref 11.5–15.5)
WBC: 10.1 10*3/uL (ref 4.0–10.5)

## 2019-04-06 LAB — COMPREHENSIVE METABOLIC PANEL
ALT: 19 U/L (ref 0–53)
AST: 19 U/L (ref 0–37)
Albumin: 3.9 g/dL (ref 3.5–5.2)
Alkaline Phosphatase: 66 U/L (ref 39–117)
BUN: 18 mg/dL (ref 6–23)
CO2: 30 mEq/L (ref 19–32)
Calcium: 8.8 mg/dL (ref 8.4–10.5)
Chloride: 100 mEq/L (ref 96–112)
Creatinine, Ser: 0.82 mg/dL (ref 0.40–1.50)
GFR: 91.16 mL/min (ref >60.00)
Glucose, Bld: 111 mg/dL — ABNORMAL HIGH (ref 70–99)
Potassium: 4.1 mEq/L (ref 3.5–5.1)
Sodium: 138 mEq/L (ref 135–145)
Total Bilirubin: 0.8 mg/dL (ref 0.2–1.2)
Total Protein: 6.6 g/dL (ref 6.0–8.3)

## 2019-04-07 LAB — PRO B NATRIURETIC PEPTIDE: NT-Pro BNP: 194 pg/mL (ref 0–486)

## 2019-04-08 NOTE — Progress Notes (Signed)
Lab work is stable.  Has your lower extremity swelling improved with the Lasix?  Has weight decreased?  Shortness of breath improved at all?  [name], FNP

## 2019-04-09 NOTE — Progress Notes (Signed)
[place] noted.   Patient can follow-up with primary care or Dr. Stanford Breed his cardiologist for a follow-up.  They can decide at that time if patient should be maintained on diuretics.  [name] FNP

## 2019-04-26 ENCOUNTER — Other Ambulatory Visit: Payer: Self-pay | Admitting: Pulmonary Disease

## 2019-05-11 ENCOUNTER — Telehealth: Payer: Self-pay | Admitting: Pulmonary Disease

## 2019-05-11 ENCOUNTER — Other Ambulatory Visit (HOSPITAL_COMMUNITY)
Admission: RE | Admit: 2019-05-11 | Discharge: 2019-05-11 | Disposition: A | Discharge status: Home / Self Care | Payer: Medicare Other | Source: Ambulatory Visit | Attending: Pulmonary Disease | Admitting: Pulmonary Disease

## 2019-05-11 DIAGNOSIS — Z20828 Contact with and (suspected) exposure to other viral communicable diseases: Secondary | ICD-10-CM | POA: Diagnosis not present

## 2019-05-11 DIAGNOSIS — Z01812 Encounter for preprocedural laboratory examination: Secondary | ICD-10-CM | POA: Insufficient documentation

## 2019-05-11 LAB — SARS CORONAVIRUS 2 (TAT 6-24 HRS): SARS Coronavirus 2: NEGATIVE

## 2019-05-11 NOTE — Telephone Encounter (Signed)
Called and spoke w/ pt. Pt states he received a call from the office for COVID screening for his appt scheduled 05/14/2019. I performed the COVID screen, which he was negative for except for that he has a pending pre-procedure COVID test for his PFT (see labs). I am routing this message to [place] per COVID screen protocol. Pt states he just had the COVID test performed today.

## 2019-05-11 NOTE — Telephone Encounter (Signed)
Call returned to patient, made aware that as long as he is negative appt will remain accordingly. Voiced understanding. Nothing further is needed at this time.

## 2019-05-11 NOTE — Telephone Encounter (Signed)
OK that is fine.   [name]

## 2019-05-13 NOTE — Progress Notes (Signed)
SarsCOV2 negative this is good news.   [name]

## 2019-05-13 NOTE — Progress Notes (Signed)
_0  ID: [name], male    DOB: 1941/02/22, 78 y.o.   MRN: [id number]  Chief Complaint  Patient presents with  . Results    Discuss results of PFT - SOB may be a little worse since last visit.    Referring provider: Javier Glazier, MD  HPI:  78 year old male former smoker referred to our office in 2018.  Managed in our office for COPD and obstructive sleep apnea (managed on CPAP).  PMH: Rheumatoid arthritis (currently on methotrexate)-followed by Dr. Trudie Reed Smoker/ Smoking History: Former Smoker, Quit 2012, 81 pack years Maintenance:  Breo Ellipta 100, Spiriva 2.5 Pt of: Dr. Lake Bells  05/14/2019  - Visit   78 year old male former smoker presenting to our office today after completing pulmonary function testing.  Pulmonary function testing results listed below:  05/14/2019-pulmonary function test- FVC 2.40 (64% predicted), postbronchodilator ratio of 66, postbronchodilator FEV1 1.54 (58% predicted), no bronchodilator response, DLCO 13.34 (58% predicted)  Patient reports that he continues to have aspects of dyspnea.  Overall he feels that this is stable but has not improved much over the past couple of months.  He continues to be adherent with his [facility] as well as his Spiriva Respimat.  He has resumed physical activity in the pool.  He is walking 12 laps 3 times weekly.  He feels that this is helping.  The pool exercise has not helped with his overall dyspnea throughout the day.  Patient continues to be adherent with his CPAP.  CPAP compliance report today shows excellent compliance.  Compliance report listed below:  04/14/2019-05/13/2019- 30 had a last 30 days use, all 30 those days greater than 4 hours, average usage 11 hours and 33 minutes, CPAP set pressure of 8, AHI 0.8   Tests:   Pulmonary function testing: >>>October 2015 from [place] ratio 58% FEV1 1.54 L 57% predicted, FVC 2.65 L 70% predicted, total lung capacity 157% predicted residual volume 263%  predicted DLCO 61% predic   07/04/2018-pulmonary function test- FVC 2.64 (71% predicted), ratio 71, FEV1 70, no significant bronchodilator response, DLCO 54  05/14/2019-pulmonary function test- FVC 2.40 (64% predicted), postbronchodilator ratio of 66, postbronchodilator FEV1 1.54 (58% predicted), no bronchodilator response, DLCO 13.34 (58% predicted)   Chest imaging: March 2018 CT chest lung cancer screening: Rads 2, images, showing nonspecific scarring more in the right lung base than the left, not consistent with an interstitial lung disease 12/19/2017-CT chest lung cancer screening-lung RADS 2, follow-up in 12 months 05/30/2018-chest x-ray-stable bilateral interstitial prominence suggesting chronic interstitial lung disease 06/29/2018-CT chest high-res- spectrum of findings compatible with 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 PSG on 04/25/2000 gave an index of 78/hr.  Ever since then he has  been using CPAP at [facility]  03/13/2019-overnight oximetry test- duration 22 hours 11 minutes and 6 seconds, total time below 88% was 5 hours 29 minutes and 21 seconds, qualifying for o2  03/22/2019- overnight oximetry- on 2 L - duration of sleep 11 hours and 3 minutes, total time below 89% 1 hour and 4 minutes and 19 seconds, total time below 88% 21 minutes and 39 seconds  SIX MIN WALK 05/14/2019 02/22/2019 11/17/2016  Supplimental Oxygen during Test? (L/min) No No No  Tech Comments: The patient had to stop multiple times during his walk. He maintained the  same pace, but had to be put on 4L O2 pulse and able to keep O2 at 92%. walk with pt at slow pace, pt did experience SOB and had to stop multiple times. O2 sat dropped to 86 but quickly recovered with rest. Walk stopped  early due to elevevated HR of 154 and c/o slight dizziness. pt walked a moderate pace, did not complete lap 3 d/t back pain.      FENO:  No results found for: NITRICOXIDE  PFT: PFT Results Latest Ref Rng & Units 05/14/2019 07/04/2018  FVC-Pre L 2.40 2.64  FVC-Predicted Pre % 64 71  FVC-Post L 2.32 2.59  FVC-Predicted Post % 62 70  Pre FEV1/FVC % % 66 71  Post FEV1/FCV % % 66 72  FEV1-Pre L 1.57 1.87  FEV1-Predicted Pre % 59 70  FEV1-Post L 1.54 1.86  DLCO UNC% % 58 52  DLCO COR %Predicted % 84 78  TLC L 4.47 5.26  TLC % Predicted % 69 81  RV % Predicted % 89 109    Imaging: No results found.    Specialty Problems      Pulmonary Problems   GOLD COPD II B    Pulmonary function testing: October 2015 from [facility] ratio 58% FEV1 1.54 L 57% predicted, FVC 2.65 L 70% predicted, total lung capacity 157% predicted residual volume 263% predicted DLCO 61% predicted  07/04/2018-pulmonary function test- FVC 2.64 (71% predicted), ratio 71, FEV1 70, no significant bronchodilator response, DLCO 54       OSA on CPAP    >>>N. PSG on 04/25/2000 gave an index of 78/hr.  Ever since then he has  been using CPAP at 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 Sulfa  Antibiotics Shortness Of Breath  . Sulfasalazine Shortness Of Breath  . Lisinopril Cough  . Varenicline Other (See Comments)    Strange thoughts    Immunization History  Administered Date(s) Administered  . 19-influenza Whole 07/18/2012, 06/14/2014, 08/12/2015, 06/03/2016  . HiB (PRP-T) 08/23/2017  . Influenza, High Dose Seasonal PF 06/12/2018  . Influenza,inj,Quad PF,6+ Mos 06/28/2017  . Influenza-Unspecified 07/18/2012, 06/14/2014, 08/12/2015, 06/03/2016, 06/28/2017  . Meningococcal B, OMV 08/23/2017, 09/21/2017  . Meningococcal Conjugate 08/23/2017  . Meningococcal Mcv4o 08/23/2017, 10/18/2017  . Pneumococcal Conjugate-13 11/28/2013, 11/28/2013  . Pneumococcal Polysaccharide-23 12/26/2005, 09/13/2012  . Pneumococcal-Unspecified 12/26/2005, 09/13/2012  . Tdap 11/28/2013, 11/28/2013  . Zoster 06/06/2012, 06/06/2012    Past Medical History:  Diagnosis Date  . Chronic lower back pain   . Complication of anesthesia 01/2017   was in ICU due to breathing complications-  . COPD (chronic obstructive pulmonary disease) ([place])   . Degenerative scoliosis in adult patient   . Depression   .  Dyspnea    on exertion  . GERD (gastroesophageal reflux disease)   . H/O seasonal allergies   . High cholesterol   . 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 History of kidney stones   . Hypertension   . 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 OSA on CPAP    wears CPAP  . Pneumonia    "I've had it several times; last time was end of Jan 2018" (05/02/2017)  . Pre-diabetes   . Prostate cancer ([place])   . Rheumatoid arteritis ([place])    "all over" (05/02/2017)  . Thrush 12/07/2017  . Wears glasses     Tobacco History: Social History   Tobacco Use  Smoking Status Former Smoker  . Packs/day: 1.50  . Years: 54.00  . Pack years: 81.00  . Types: Cigarettes  . Start date: 02/21/1961  . Quit date: 09/27/2014  . Years since quitting: 4.6   Smokeless Tobacco Never Used  Tobacco Comment   05/02/2017 "quit vaping 09/2016"   Counseling given: Yes Comment: 05/02/2017 "quit vaping 09/2016"  Continue to not smoke  Outpatient Encounter Medications as of 05/14/2019  Medication Sig  . albuterol (PROVENTIL HFA;VENTOLIN HFA) 108 (90 Base) MCG/ACT inhaler Inhale 2 puffs into the lungs every 6 (six) hours as needed for wheezing or shortness of breath.  . cetirizine (ZYRTEC) 10 MG tablet Take 1 tablet by mouth as needed.  . DULoxetine (CYMBALTA) 60 MG capsule Take 60 mg by mouth every evening.   . famotidine (PEPCID) 40 MG tablet Take 1 tablet (40 mg total) by mouth daily.  . finasteride (PROSCAR) 5 MG tablet Take 5 mg by mouth at bedtime.   . fluticasone (FLONASE) 50 MCG/ACT nasal spray Place 2 sprays into both nostrils daily as needed for allergies.   . fluticasone furoate-vilanterol (BREO ELLIPTA) 100-25 MCG/INH AEPB Inhale 1 puff into the lungs daily.  . folic acid (FOLVITE) 1 MG tablet Take 1 mg by mouth daily.  . furosemide (LASIX) 20 MG tablet Take 1 tablet (17m total) daily for next 7 days for fluid management  . gabapentin (NEURONTIN) 300 MG capsule Take 800 mg by mouth 3 (three) times daily.   .Marland Kitchenibuprofen (ADVIL) 200 MG tablet Take by mouth daily as needed. 800 - 1,200 as needed  . losartan (COZAAR) 25 MG tablet Take 1 tablet (25 mg total) by mouth daily.  . metFORMIN (GLUCOPHAGE) 500 MG tablet Take 1 tablet by mouth daily.  . methotrexate (RHEUMATREX) 2.5 MG tablet Take 2.5 mg by mouth once a week. Caution:Chemotherapy. Protect from light.  . predniSONE (DELTASONE) 5 MG tablet Take 5 mg by mouth daily with breakfast.  . simvastatin (ZOCOR) 40 MG tablet Take 40 mg by mouth at bedtime.   . sodium chloride (OCEAN) 0.65 % SOLN nasal spray Place 1 spray into both nostrils as needed for congestion.  . tamsulosin (FLOMAX) 0.4 MG CAPS capsule Take 0.4 mg by mouth at bedtime.   . Tiotropium Bromide Monohydrate (SPIRIVA RESPIMAT) 2.5  MCG/ACT AERS Inhale 2 puffs into the lungs daily.  . traZODone (DESYREL) 50 MG tablet Take 50 mg by mouth at bedtime.  . metoprolol succinate (TOPROL-XL) 25 MG 24 hr tablet Take 1 tablet (25 mg total) by mouth 2 (two) times daily. Take with or immediately following a meal.   No facility-administered encounter medications on file as of 05/14/2019.      Review of Systems  Review of Systems  Constitutional:  Positive for fatigue. Negative for activity change, chills, fever and unexpected weight change.  HENT: Positive for congestion. Negative for postnasal drip, rhinorrhea, sinus pressure, sinus pain and sore throat.   Eyes: Negative.   Respiratory: Positive for cough (clear phlegm) and shortness of breath. Negative for wheezing.   Cardiovascular: Negative for chest pain and palpitations.  Gastrointestinal: Negative for constipation, diarrhea, nausea and vomiting.  Endocrine: Negative.   Genitourinary: Negative.   Musculoskeletal: Negative.   Skin: Negative.   Neurological: Negative for dizziness and headaches.  Psychiatric/Behavioral: Negative.  Negative for dysphoric mood. The patient is not nervous/anxious.   All other systems reviewed and are negative.    Physical Exam  BP 118/64 (BP Location: Left Arm, Patient Position: Sitting, Cuff Size: Normal)   Pulse 87   Temp (!) 97.3 F (36.3 C)   Ht _0  (1.702 m)   Wt 246 lb (111.6 kg)   SpO2 93%   BMI 38.53 kg/m   Wt Readings from Last 5 Encounters:  05/14/19 246 lb (111.6 kg)  04/05/19 244 lb (110.7 kg)  02/22/19 245 lb 12.8 oz (111.5 kg)  02/21/19 245 lb (111.1 kg)  11/16/18 236 lb (107 kg)     Physical Exam Vitals signs and nursing note reviewed.  Constitutional:      General: He is not in acute distress.    Appearance: Normal appearance. He is obese.  HENT:     Head: Normocephalic and atraumatic.     Right Ear: Hearing, tympanic membrane, ear canal and external ear normal.     Left Ear: Hearing, tympanic membrane,  ear canal and external ear normal.     Nose: Nose normal. No mucosal edema or rhinorrhea.     Right Turbinates: Not enlarged.     Left Turbinates: Not enlarged.     Mouth/Throat:     Mouth: Mucous membranes are dry.     Pharynx: Oropharynx is clear. No oropharyngeal exudate.  Eyes:     Pupils: Pupils are equal, round, and reactive to light.  Neck:     Musculoskeletal: Normal range of motion.  Cardiovascular:     Rate and Rhythm: Normal rate and regular rhythm.     Pulses: Normal pulses.     Heart sounds: Normal heart sounds. No murmur.  Pulmonary:     Breath sounds: Examination of the right-middle field reveals rales. Examination of the left-middle field reveals rales. Examination of the right-lower field reveals rales. Examination of the left-lower field reveals rales. Rales (basilar crackles ) present. No decreased breath sounds or wheezing.  Abdominal:     General: Bowel sounds are normal. There is no distension.     Palpations: Abdomen is soft.     Tenderness: There is no abdominal tenderness.  Musculoskeletal:     Right lower leg: No edema.     Left lower leg: No edema.  Lymphadenopathy:     Cervical: No cervical adenopathy.  Skin:    General: Skin is warm and dry.     Capillary Refill: Capillary refill takes less than 2 seconds.     Findings: No erythema or rash.  Neurological:     General: No focal deficit present.     Mental Status: He is alert and oriented to person, place, and time.     Motor: No weakness.     Coordination: Coordination normal.     Gait: Gait is intact. Gait normal.  Psychiatric:        Mood and Affect: Mood normal.  Behavior: Behavior normal. Behavior is cooperative.        Thought Content: Thought content normal.        Judgment: Judgment normal.      Lab Results:  CBC    Component Value Date/Time   WBC 10.1 04/06/2019 1510   RBC 3.72 (L) 04/06/2019 1510   HGB 13.4 04/06/2019 1510   HCT 40.4 04/06/2019 1510   PLT 265.0  04/06/2019 1510   MCV 108.7 (H) 04/06/2019 1510   MCH 34.8 (H) 02/21/2019 1125   MCHC 33.3 04/06/2019 1510   RDW 14.6 04/06/2019 1510   LYMPHSABS 2.2 04/06/2019 1510   MONOABS 1.6 (H) 04/06/2019 1510   EOSABS 0.1 04/06/2019 1510   BASOSABS 0.0 04/06/2019 1510    BMET    Component Value Date/Time   NA 138 04/06/2019 1510   K 4.1 04/06/2019 1510   CL 100 04/06/2019 1510   CO2 30 04/06/2019 1510   GLUCOSE 111 (H) 04/06/2019 1510   BUN 18 04/06/2019 1510   CREATININE 0.82 04/06/2019 1510   CREATININE 0.94 02/21/2019 1125   CALCIUM 8.8 04/06/2019 1510   GFRNONAA 78 02/21/2019 1125   GFRAA 91 02/21/2019 1125    BNP    Component Value Date/Time   BNP 120.0 (H) 07/13/2018 1858    ProBNP    Component Value Date/Time   PROBNP 194 04/06/2019 1510      Assessment & Plan:   GOLD COPD II B Plan: Continue Breo Ellipta 100 Continue Spiriva Respimat 2.5 Walk today in office Will establish with Dr. Vaughan Browner in 4 to 8 weeks   OSA on CPAP Assessment: Excellent CPAP compliance Well-controlled AHI on CPAP compliance report today 2001 sleep study shows AHI of 78  Plan: Continue CPAP therapy with 3 L of O2  ILD (interstitial lung disease) ([place]) Plan: 2 L of O2 with exertion Continue CPAP therapy with 3 L bled in Continue methotrexate and prednisone under management of rheumatology We will establish patient with Dr. Vaughan Browner for outpatient management.  Former Dr. Lake Bells patient. May need to consider anti-fibrotic's in the future May need to consider repeat CT imaging after appointment with Dr. Vaughan Browner Will route pulmonary function testing results as well as office note to Dr. Trudie Reed  Rheumatoid arthritis involving multiple sites with positive rheumatoid factor ([place]) Plan: Continue follow-up with Dr. Trudie Reed  Former tobacco use Plan: Continue not smoking Complete LD LCS CT in October/2020    Return in about 6 weeks (around 06/25/2019), or if symptoms worsen or fail to  improve, for Follow up with Dr. Vaughan Browner establish as new pt - 55mn visit .   BLauraine Rinne NP 05/14/2019   This appointment was 32 minutes long with over 50% of the time in direct face-to-face patient care, assessment, plan of care, and follow-up.

## 2019-05-14 ENCOUNTER — Ambulatory Visit: Payer: Medicare Other | Admitting: Pulmonary Disease

## 2019-05-14 ENCOUNTER — Ambulatory Visit (INDEPENDENT_AMBULATORY_CARE_PROVIDER_SITE_OTHER): Payer: Medicare Other | Admitting: Pulmonary Disease

## 2019-05-14 ENCOUNTER — Encounter: Payer: Self-pay | Admitting: Pulmonary Disease

## 2019-05-14 ENCOUNTER — Other Ambulatory Visit: Payer: Self-pay

## 2019-05-14 VITALS — BP 118/64 | HR 87 | Temp 97.3°F | Ht 67.0 in | Wt 246.0 lb

## 2019-05-14 DIAGNOSIS — J42 Unspecified chronic bronchitis: Secondary | ICD-10-CM

## 2019-05-14 DIAGNOSIS — J849 Interstitial pulmonary disease, unspecified: Secondary | ICD-10-CM

## 2019-05-14 DIAGNOSIS — Z9989 Dependence on other enabling machines and devices: Secondary | ICD-10-CM

## 2019-05-14 DIAGNOSIS — G4733 Obstructive sleep apnea (adult) (pediatric): Secondary | ICD-10-CM | POA: Diagnosis not present

## 2019-05-14 DIAGNOSIS — M0579 Rheumatoid arthritis with rheumatoid factor of multiple sites without organ or systems involvement: Secondary | ICD-10-CM

## 2019-05-14 DIAGNOSIS — Z87891 Personal history of nicotine dependence: Secondary | ICD-10-CM

## 2019-05-14 LAB — PULMONARY FUNCTION TEST
DL/VA % pred: 84 %
DL/VA: 3.36 ml/min/mmHg/L
DLCO cor % pred: 60 %
DLCO cor: 13.83 ml/min/mmHg
DLCO unc % pred: 58 %
DLCO unc: 13.34 ml/min/mmHg
FEF 25-75 Post: 0.71 L/sec
FEF 25-75 Pre: 0.84 L/sec
FEF2575-%Change-Post: -15 %
FEF2575-%Pred-Post: 37 %
FEF2575-%Pred-Pre: 44 %
FEV1-%Change-Post: -2 %
FEV1-%Pred-Post: 58 %
FEV1-%Pred-Pre: 59 %
FEV1-Post: 1.54 L
FEV1-Pre: 1.57 L
FEV1FVC-%Change-Post: 1 %
FEV1FVC-%Pred-Pre: 90 %
FEV6-%Change-Post: -3 %
FEV6-%Pred-Post: 65 %
FEV6-%Pred-Pre: 67 %
FEV6-Post: 2.26 L
FEV6-Pre: 2.33 L
FEV6FVC-%Change-Post: 0 %
FEV6FVC-%Pred-Post: 104 %
FEV6FVC-%Pred-Pre: 104 %
FVC-%Change-Post: -3 %
FVC-%Pred-Post: 62 %
FVC-%Pred-Pre: 64 %
FVC-Post: 2.32 L
FVC-Pre: 2.4 L
Post FEV1/FVC ratio: 66 %
Post FEV6/FVC ratio: 97 %
Pre FEV1/FVC ratio: 66 %
Pre FEV6/FVC Ratio: 97 %
RV % pred: 89 %
RV: 2.15 L
TLC % pred: 69 %
TLC: 4.47 L

## 2019-05-14 NOTE — Assessment & Plan Note (Addendum)
Plan: Continue not smoking Complete LD LCS CT in October/2020

## 2019-05-14 NOTE — Assessment & Plan Note (Signed)
Plan: 2 L of O2 with exertion Continue CPAP therapy with 3 L bled in Continue methotrexate and prednisone under management of rheumatology We will establish patient with Dr. Vaughan Browner for outpatient management.  Former Dr. Lake Bells patient. May need to consider anti-fibrotic's in the future May need to consider repeat CT imaging after appointment with Dr. Vaughan Browner Will route pulmonary function testing results as well as office note to Dr. Trudie Reed

## 2019-05-14 NOTE — Assessment & Plan Note (Signed)
Plan: Continue Breo Ellipta 100 Continue Spiriva Respimat 2.5 Walk today in office Will establish with Dr. Vaughan Browner in 4 to 8 weeks

## 2019-05-14 NOTE — Assessment & Plan Note (Signed)
Plan: Continue follow-up with Dr. Trudie Reed

## 2019-05-14 NOTE — Assessment & Plan Note (Signed)
Assessment: Excellent CPAP compliance Well-controlled AHI on CPAP compliance report today 2001 sleep study shows AHI of 78  Plan: Continue CPAP therapy with 3 L of O2 

## 2019-05-14 NOTE — Progress Notes (Signed)
PFT done today.

## 2019-05-14 NOTE — Patient Instructions (Addendum)
You were seen today by Lauraine Rinne, NP  for:   1. ILD (interstitial lung disease) (HCC)  Walk today   Continue methotrexate Continue prednisone  We will consider whether or not you need to be on a drug to slow the progression of rheumatoid arthritis related fibrotic disease after the next lung function test later this year.  Establish with Dr. Vaughan Browner at our office as a new pt   2. Chronic bronchitis, unspecified chronic bronchitis type (HCC)  Breo Ellipta 100 >>>Take 1 puff daily in the morning right when you wake up >>>Rinse your mouth out after use >>>This is a daily maintenance inhaler, NOTa rescue inhaler >>>Contact our office if you are having difficulties affording or obtaining this medication >>>It is important for you to be able to take this daily and not miss any doses  Spiriva Respimat 2.5 >>> 2 puffs daily >>> Do this every day >>>This is not a rescue inhaler  Keep using albuterol as needed for chest tightness wheezing or shortness of breath Practice good hand hygiene Stay physically active  Continuephenylephrine Continue saline sprays Continue Flonase Continue Mucinex   3. OSA on CPAP  Continue CPAP with 3L o2   We recommend that you continue using your CPAP daily >>>Keep up the hard work using your device >>> Goal should be wearing this for the entire night that you are sleeping, at least 4 to 6 hours  Remember:   Do not drive or operate heavy machinery if tired or drowsy.   Please notify the supply company and office if you are unable to use your device regularly due to missing supplies or machine being broken.   Work on maintaining a healthy weight and following your recommended nutrition plan   Maintain proper daily exercise and movement   Maintaining proper use of your device can also help improve management of other chronic illnesses such as: Blood pressure, blood sugars, and weight management.   BiPAP/ CPAP Cleaning:  >>>Clean  weekly, with Dawn soap, and bottle brush. Set up to air dry.  4. Rheumatoid arthritis involving multiple sites with positive rheumatoid factor ([place])  Continue follow up with Dr. Trudie Reed   5. Former tobacco use  Continue to not smoke   Continue Lung Cancer Screening in October / 2020   Follow Up:    Return in about 6 weeks (around 06/25/2019), or if symptoms worsen or fail to improve, for Follow up with Dr. Vaughan Browner establish as new pt - 42mn visit .   Please do your part to reduce the spread of COVID-19:      Reduce your risk of any infection  and COVID19 by using the similar precautions used for avoiding the common cold or flu:  .Marland KitchenWash your hands often with soap and warm water for at least 20 seconds.  If soap and water are not readily available, use an alcohol-based hand sanitizer with at least 60% alcohol.  . If coughing or sneezing, cover your mouth and nose by coughing or sneezing into the elbow areas of your shirt or coat, into a tissue or into your sleeve (not your hands). .Langley GaussA MASK when in public  . Avoid shaking hands with others and consider head nods or verbal greetings only. . Avoid touching your eyes, nose, or mouth with unwashed hands.  . Avoid close contact with people who are sick. . Avoid places or events with large numbers of people in one location, like concerts or sporting events. . If  you have some symptoms but not all symptoms, continue to monitor at home and seek medical attention if your symptoms worsen. . If you are having a medical emergency, call 911.   [place] / e-Visit: [email or web address]         [facility]: [facility]: [phone]                   [facility]: [phone]     It is flu season:   >>> Best ways to protect herself from the flu: Receive the yearly flu vaccine,  practice good hand hygiene washing with soap and also using hand sanitizer when available, eat a nutritious meals, get adequate rest, hydrate appropriately   Please contact the office if your symptoms worsen or you have concerns that you are not improving.   Thank you for choosing [facility] for your healthcare, and for allowing [place] to partner with you on your healthcare journey. I am thankful to be able to provide care to you today.   [name] FNP-C

## 2019-06-26 ENCOUNTER — Other Ambulatory Visit: Payer: Self-pay

## 2019-06-26 ENCOUNTER — Ambulatory Visit: Payer: Medicare Other | Admitting: Pulmonary Disease

## 2019-06-26 ENCOUNTER — Encounter: Payer: Self-pay | Admitting: Pulmonary Disease

## 2019-06-26 VITALS — BP 142/80 | HR 106 | Ht 67.0 in | Wt 246.8 lb

## 2019-06-26 DIAGNOSIS — R918 Other nonspecific abnormal finding of lung field: Secondary | ICD-10-CM | POA: Diagnosis not present

## 2019-06-26 DIAGNOSIS — J849 Interstitial pulmonary disease, unspecified: Secondary | ICD-10-CM | POA: Diagnosis not present

## 2019-06-26 NOTE — Patient Instructions (Signed)
We will cancel the low-dose screening CT and change to a high-resolution CT Follow-up in clinic in 2 weeks to review CT scan and plan for next steps.

## 2019-06-26 NOTE — Progress Notes (Signed)
[name]    [id number]    1940/11/29  Primary Care Physician:[name], Michael Mate, MD  Referring Physician: Javier Glazier, MD [address] [place],  [place] [address]  Chief complaint: Follow-up for RA ILD, OSA  HPI: Mr. 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  Has been on chronic methotrexate and folic acid since at least early 2000's.  He has been tried on Biologics and Plaquenil in the past.  Follows with Dr. Trudie Buchanan, rheumatology.  Pets: No pets Occupation: Retired Hydrologist professor Exposures: No known exposures.  No mold, hot tub, Jacuzzi, down pillows or comforter Smoking history: 80-pack-year smoker.  Quit in 2015 Travel history: Grew up in [facility].  Previously lived in [place], [place].  No significant recent travel Relevant family history: No significant family history of lung disease  Outpatient Encounter Medications as of 06/26/2019  Medication Sig  . albuterol (PROVENTIL HFA;VENTOLIN HFA) 108 (90 Base) MCG/ACT inhaler Inhale 2 puffs into the lungs every 6 (six) hours as needed for wheezing or shortness of breath.  . cetirizine (ZYRTEC) 10 MG tablet Take 1 tablet by mouth as needed.  . DULoxetine (CYMBALTA) 60 MG capsule Take 60 mg by mouth every evening.   . famotidine (PEPCID) 40 MG tablet Take 1 tablet (40 mg total) by mouth daily.  . finasteride (PROSCAR) 5 MG tablet Take 5 mg by mouth at bedtime.   . fluticasone (FLONASE) 50 MCG/ACT nasal spray Place 2 sprays into both nostrils daily as needed for allergies.   . fluticasone furoate-vilanterol (BREO ELLIPTA) 100-25 MCG/INH AEPB Inhale 1 puff into the lungs daily.  . folic acid (FOLVITE) 1 MG tablet Take 1 mg by mouth daily.  . furosemide (LASIX) 20 MG tablet Take 1 tablet (52m total) daily for next 7 days for fluid management  . gabapentin (NEURONTIN) 300 MG capsule Take 800 mg by mouth 3 (three) times  daily.   .Marland Kitchenibuprofen (ADVIL) 200 MG tablet Take by mouth daily as needed. 800 - 1,200 as needed  . losartan (COZAAR) 25 MG tablet Take 1 tablet (25 mg total) by mouth daily.  . metFORMIN (GLUCOPHAGE) 500 MG tablet Take 1 tablet by mouth daily.  . methotrexate (RHEUMATREX) 2.5 MG tablet Take 2.5 mg by mouth once a week. Caution:Chemotherapy. Protect from light.  . predniSONE (DELTASONE) 5 MG tablet Take 5 mg by mouth daily with breakfast.  . simvastatin (ZOCOR) 40 MG tablet Take 40 mg by mouth at bedtime.   . sodium chloride (OCEAN) 0.65 % SOLN nasal spray Place 1 spray into both nostrils as needed for congestion.  . tamsulosin (FLOMAX) 0.4 MG CAPS capsule Take 0.4 mg by mouth at bedtime.   . Tiotropium Bromide Monohydrate (SPIRIVA RESPIMAT) 2.5 MCG/ACT AERS Inhale 2 puffs into the lungs daily.  . traZODone (DESYREL) 50 MG tablet Take 50 mg by mouth at bedtime.  . metoprolol succinate (TOPROL-XL) 25 MG 24 hr tablet Take 1 tablet (25 mg total) by mouth 2 (two) times daily. Take with or immediately following a meal.   No facility-administered encounter medications on file as of 06/26/2019.     Allergies as of 06/26/2019 - Review Complete 06/26/2019  Allergen Reaction Noted  . Sulfa antibiotics Shortness Of Breath 10/29/2016  . Sulfasalazine Shortness Of Breath 10/29/2016  . Lisinopril Cough 06/13/2018  . Varenicline Other (See Comments) 10/25/2016    Past Medical History:  Diagnosis Date  . Chronic lower back pain   . Complication of anesthesia 01/2017   was in ICU due to breathing complications-  . COPD (chronic obstructive pulmonary disease) ([facility])   . Degenerative scoliosis in adult patient   . Depression   . Dyspnea    on exertion  . GERD (gastroesophageal reflux disease)   . H/O seasonal allergies   . High cholesterol   . 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 History of kidney stones   . Hypertension   . 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 OSA on CPAP    wears CPAP  . Pneumonia    "I've had it several times; last time was end of Jan 2018" (05/02/2017)  . Pre-diabetes   . Prostate cancer ([place])   . 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 Thrush 12/07/2017  . Wears glasses     Past Surgical History:  Procedure Laterality Date  . BACK SURGERY    . COLONOSCOPY W/ POLYPECTOMY    . FRACTURE SURGERY    . HERNIA REPAIR    . HIP ARTHROPLASTY Left 04/2016  . INGUINAL HERNIA REPAIR Left 1986  . JOINT REPLACEMENT    . LUMBAR WOUND DEBRIDEMENT N/A 04/18/2017   Procedure: REPAIR OF LUMBAR PSEUDOMENINGOCELE;  Surgeon: Michael Pies, MD;  Location: [facility];  Service: Neurosurgery;  Laterality: N/A;  REAIR OF LUMBAR PSEUDOMENINGOCELE  . LUMBAR WOUND DEBRIDEMENT N/A 05/05/2017   Procedure: I&D Lumbar Wound;  Surgeon: Michael Pies, MD;  Location: [facility];  Service: Neurosurgery;  Laterality: N/A;  . MAXIMUM ACCESS (MAS)POSTERIOR LUMBAR INTERBODY FUSION (PLIF) 3 LEVEL  02/17/2017   [name] 02/17/2017  . OPEN SURGICAL REPAIR OF GLUTEAL TENDON Right 11/02/2017   Procedure: Right hip gluteal tendon repair;  Surgeon: Michael Arabian, MD;  Location: [facility];  Service: Orthopedics;  Laterality: Right;  . POSTERIOR LAMINECTOMY / DECOMPRESSION LUMBAR SPINE  2010  . PROSTATE BIOPSY    . PROSTATE BIOPSY  <2013 X 3  . SPLENECTOMY  1955  . TONSILLECTOMY    . TOTAL HIP ARTHROPLASTY Right 10/2015  . UMBILICAL HERNIA REPAIR  05/2016    Family History  Problem Relation Age of Onset  . Cancer Mother   . Heart disease Father   . Stroke Father     Social History   Socioeconomic History  . Marital status: Married    Spouse name: Not on file  . Number of children: Not on file  . Years of education: Not on file  . Highest education level: Not on file  Occupational History  . Not on file  Social Needs  . Financial resource strain: Not on file  . Food insecurity    Worry: Not on file     Inability: Not on file  . Transportation needs    Medical: Not on file    Non-medical: Not on file  Tobacco Use  . Smoking status: Former Smoker    Packs/day: 1.50    Years: 54.00    Pack years: 81.00    Types: Cigarettes    Start date: 02/21/1961    Quit date: 09/27/2014    Years since quitting: 4.7  . Smokeless tobacco: Never Used  . Tobacco comment: 05/02/2017 "quit vaping 09/2016"  Substance and Sexual Activity  . Alcohol use: Yes    Alcohol/week: 17.0 standard drinks    Types: 7 Cans of beer, 10 Shots of liquor  per week    Comment: Daily shots, beer etc  . Drug use: No  . Sexual activity: Not Currently  Lifestyle  . Physical activity    Days per week: Not on file    Minutes per session: Not on file  . Stress: Not on file  Relationships  . Social Herbalist on phone: Not on file    Gets together: Not on file    Attends religious service: Not on file    Active member of club or organization: Not on file    Attends meetings of clubs or organizations: Not on file    Relationship status: Not on file  . Intimate partner violence    Fear of current or ex partner: Not on file    Emotionally abused: Not on file    Physically abused: Not on file    Forced sexual activity: Not on file  Other Topics Concern  . Not on file  Social History Narrative  . Not on file    Review of systems: Review of Systems  Constitutional: Negative for fever and chills.  HENT: Negative.   Eyes: Negative for blurred vision.  Respiratory: as per HPI  Cardiovascular: Negative for chest pain and palpitations.  Gastrointestinal: Negative for vomiting, diarrhea, blood per rectum. Genitourinary: Negative for dysuria, urgency, frequency and hematuria.  Musculoskeletal: Negative for myalgias, back pain and joint pain.  Skin: Negative for itching and rash.  Neurological: Negative for dizziness, tremors, focal weakness, seizures and loss of consciousness.  Buchanan/Heme/Allergies: Negative for  environmental allergies.  Psychiatric/Behavioral: Negative for depression, suicidal ideas and hallucinations.  All other systems reviewed and are negative.  Physical Exam: Blood pressure (!) 142/80, pulse (!) 106, height _0  (1.702 m), weight 246 lb 12.8 oz (111.9 kg), SpO2 93 %. Gen:      No acute distress HEENT:  EOMI, sclera anicteric Neck:     No masses; no thyromegaly Lungs:    Clear to auscultation bilaterally; normal respiratory effort CV:         Regular rate and rhythm; no murmurs Abd:      + bowel sounds; soft, non-tender; no palpable masses, no distension Ext:    No edema; adequate peripheral perfusion Skin:      Warm and dry; no rash Neuro: alert and oriented x 3 Psych: normal mood and affect  Data Reviewed: Imaging: High-resolution CT 06/29/2018- Moderate emphysema. Calcified granulomas.  Subcentimeter pulmonary nodule.  Subpleural reticulation, mild traction bronchiectasis with basal gradient.  Right greater than left.  No honeycombing.  Slightly worse compared to previous CT scans. Probable UIP. I have reviewed the images personally.  PFTs: 05/14/2019 FVC 2.32 [62%], FEV1 1.54 [58%], F/F 66, TLC 4.47 [69%], DLCO 13.34 [58%] Moderate restriction and diffusion defect.  Worsening FVC and DLCO compared to 2019.  Labs: CBC 04/06/2019-WBC 10.1, eos 0.8%, absolute eosinophil count 81   Sleep PSG 04/25/2000.  Severe sleep apnea, AHI 78/hr.  CPAP download 06/24/2019-100% compliance with average use 12 hours Set at CPAP of 8.  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  There is been progression on CT scan and PFTs over the years Discussed treatment with patient and suggested anti-fibrotic therapy with Ofev He is not sure if he wants therapy especially as there is no cure and only slowing down of progression.  He has a follow-up CT scan which is due in the next few weeks. We will review this and  regroup to discuss if he wants to  proceed.  Emphysema  Continue Breo, Spiriva  OSA CPAP download reviewed with good compliance and response to therapy.  Health maintenance 11/28/2013-Prevnar 09/13/2012-Pneumovax  Plan/Recommendations: - High-resolution CT  Follow-up in 2 weeks for review and plan next steps.  Marshell Garfinkel MD  Pulmonary and Critical Care 06/26/2019, 1:50 PM  CC: Michael Glazier, MD

## 2019-06-27 ENCOUNTER — Encounter (HOSPITAL_COMMUNITY): Payer: Self-pay | Admitting: *Deleted

## 2019-06-27 NOTE — Progress Notes (Signed)
Received referral from Dr. Concepcion Living for this pt to participate in Pulmonary rehab with the diagnosis of ILD. Clinical review of pt follow up appt on 06/26/19 Pulmonary office note.   Pt appropriate for scheduling for Pulmonary rehab.  Pt covid risk score 7. Will forward to support staff for scheduling and verification of insurance eligibility/benefits with pt  consent. Cherre Huger, BSN Cardiac and Training and development officer

## 2019-06-28 ENCOUNTER — Telehealth (HOSPITAL_COMMUNITY): Payer: Self-pay

## 2019-06-28 NOTE — Telephone Encounter (Signed)
Pt insurance is active and benefits verified through [facility]. Co-pay $20.00, DED $0.00/$0.00 met, out of pocket $4,000.00/$864.60 met, co-insurance 0%. No pre-authorization required. Passport, 06/28/2019 @ 2:22PM, REF# [id number]

## 2019-06-29 ENCOUNTER — Telehealth (HOSPITAL_COMMUNITY): Payer: Self-pay

## 2019-07-02 ENCOUNTER — Other Ambulatory Visit: Payer: Self-pay

## 2019-07-02 ENCOUNTER — Ambulatory Visit (HOSPITAL_BASED_OUTPATIENT_CLINIC_OR_DEPARTMENT_OTHER): Payer: Medicare Other

## 2019-07-02 ENCOUNTER — Ambulatory Visit (HOSPITAL_BASED_OUTPATIENT_CLINIC_OR_DEPARTMENT_OTHER)
Admission: RE | Admit: 2019-07-02 | Discharge: 2019-07-02 | Disposition: A | Discharge status: Home / Self Care | Payer: Medicare Other | Source: Ambulatory Visit | Attending: Pulmonary Disease | Admitting: Pulmonary Disease

## 2019-07-02 DIAGNOSIS — R918 Other nonspecific abnormal finding of lung field: Secondary | ICD-10-CM

## 2019-07-11 ENCOUNTER — Other Ambulatory Visit: Payer: Self-pay

## 2019-07-11 ENCOUNTER — Ambulatory Visit: Payer: Medicare Other | Admitting: Pulmonary Disease

## 2019-07-11 ENCOUNTER — Encounter: Payer: Self-pay | Admitting: Pulmonary Disease

## 2019-07-11 VITALS — BP 124/78 | HR 75 | Temp 97.9°F | Ht 67.0 in | Wt 244.8 lb

## 2019-07-11 DIAGNOSIS — I272 Pulmonary hypertension, unspecified: Secondary | ICD-10-CM

## 2019-07-11 DIAGNOSIS — J849 Interstitial pulmonary disease, unspecified: Secondary | ICD-10-CM | POA: Diagnosis not present

## 2019-07-11 DIAGNOSIS — J42 Unspecified chronic bronchitis: Secondary | ICD-10-CM

## 2019-07-11 NOTE — Progress Notes (Addendum)
[name]    [id number]    1941-04-02  Primary Care Physician:Piazza, Christian Mate, MD  Referring Physician: Javier Glazier, MD [address] 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  Has been on chronic methotrexate and folic acid since at least early 2000's.  He has been tried on Biologics and Plaquenil in the past.  Follows with Dr. Trudie Reed, rheumatology.  Pets: No pets Occupation: Retired Hydrologist professor Exposures: No known exposures.  No mold, hot tub, Jacuzzi, down pillows or comforter Smoking history: 80-pack-year smoker.  Quit in 2015 Travel history: G[facility] in [facility].  Previously lived in [place], [place].  No significant recent travel Relevant family history: No significant family history of lung disease  Interim history: Is here for review of CT scan.  States that his dyspnea on exertion is unchanged  Outpatient Encounter Medications as of 07/11/2019  Medication Sig  . albuterol (PROVENTIL HFA;VENTOLIN HFA) 108 (90 Base) MCG/ACT inhaler Inhale 2 puffs into the lungs every 6 (six) hours as needed for wheezing or shortness of breath.  . cetirizine (ZYRTEC) 10 MG tablet Take 1 tablet by mouth as needed.  . DULoxetine (CYMBALTA) 60 MG capsule Take 60 mg by mouth every evening.   . famotidine (PEPCID) 40 MG tablet Take 1 tablet (40 mg total) by mouth daily.  . finasteride (PROSCAR) 5 MG tablet Take 5 mg by mouth at bedtime.   . fluticasone (FLONASE) 50 MCG/ACT nasal spray Place 2 sprays into both nostrils daily as needed for allergies.   . fluticasone furoate-vilanterol (BREO ELLIPTA) 100-25 MCG/INH AEPB Inhale 1 puff into the lungs daily.  . folic acid (FOLVITE) 1 MG tablet Take 1 mg by mouth daily.  . furosemide (LASIX) 20 MG tablet Take 1 tablet (27m total) daily for next 7 days  for fluid management  . gabapentin (NEURONTIN) 300 MG capsule Take 800 mg by mouth 3 (three) times daily.   .Marland Kitchenibuprofen (ADVIL) 200 MG tablet Take by mouth daily as needed. 800 - 1,200 as needed  . losartan (COZAAR) 25 MG tablet Take 1 tablet (25 mg total) by mouth daily.  . metFORMIN (GLUCOPHAGE) 500 MG tablet Take 1 tablet by mouth daily.  . methotrexate (RHEUMATREX) 2.5 MG tablet Take 2.5 mg by mouth once a week. Caution:Chemotherapy. Protect from light.  . predniSONE (DELTASONE) 5 MG tablet Take 5 mg by mouth daily with breakfast.  . simvastatin (ZOCOR) 40 MG tablet Take 40 mg by mouth at bedtime.   . sodium chloride (OCEAN) 0.65 % SOLN nasal spray Place 1 spray into both nostrils as needed for congestion.  . tamsulosin (FLOMAX) 0.4 MG CAPS capsule Take 0.4 mg by mouth at bedtime.   . Tiotropium Bromide Monohydrate (SPIRIVA RESPIMAT) 2.5 MCG/ACT AERS Inhale 2 puffs into the lungs daily.  . traZODone (DESYREL) 50 MG tablet Take 50 mg by mouth at bedtime.  . metoprolol succinate (TOPROL-XL) 25 MG 24 hr tablet Take 1 tablet (25 mg total) by mouth 2 (two) times daily. Take with or immediately following a meal.   No facility-administered encounter medications on file as of 07/11/2019.     Allergies as of 07/11/2019 - Review Complete 07/11/2019  Allergen Reaction Noted  . Sulfa antibiotics Shortness Of Breath 10/29/2016  . Sulfasalazine Shortness Of Breath 10/29/2016  .  Lisinopril Cough 06/13/2018  . Varenicline Other (See Comments) 10/25/2016    Past Medical History:  Diagnosis Date  . Chronic lower back pain   . Complication of anesthesia 01/2017   was in ICU due to breathing complications-  . COPD (chronic obstructive pulmonary disease) ([place])   . Degenerative scoliosis in adult patient   . Depression   . Dyspnea    on exertion  . GERD (gastroesophageal reflux disease)   . H/O seasonal allergies   . High cholesterol   . History of blood transfusion    "probably w/splenectomy"   . History of kidney stones   . Hypertension   . 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 OSA on CPAP    wears CPAP  . Pneumonia    "I've had it several times; last time was end of Jan 2018" (05/02/2017)  . Pre-diabetes   . Prostate cancer ([facility])   . 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 Thrush 12/07/2017  . Wears glasses     Past Surgical History:  Procedure Laterality Date  . BACK SURGERY    . COLONOSCOPY W/ POLYPECTOMY    . FRACTURE SURGERY    . HERNIA REPAIR    . HIP ARTHROPLASTY Left 04/2016  . INGUINAL HERNIA REPAIR Left 1986  . JOINT REPLACEMENT    . LUMBAR WOUND DEBRIDEMENT N/A 04/18/2017   Procedure: REPAIR OF LUMBAR PSEUDOMENINGOCELE;  Surgeon: Newman Pies, MD;  Location: [facility];  Service: Neurosurgery;  Laterality: N/A;  REAIR OF LUMBAR PSEUDOMENINGOCELE  . LUMBAR WOUND DEBRIDEMENT N/A 05/05/2017   Procedure: I&D Lumbar Wound;  Surgeon: Newman Pies, MD;  Location: [facility];  Service: Neurosurgery;  Laterality: N/A;  . MAXIMUM ACCESS (MAS)POSTERIOR LUMBAR INTERBODY FUSION (PLIF) 3 LEVEL  02/17/2017   [name] 02/17/2017  . OPEN SURGICAL REPAIR OF GLUTEAL TENDON Right 11/02/2017   Procedure: Right hip gluteal tendon repair;  Surgeon: Gaynelle Arabian, MD;  [facility];  Service: Orthopedics;  Laterality: Right;  . POSTERIOR LAMINECTOMY / DECOMPRESSION LUMBAR SPINE  2010  . PROSTATE BIOPSY    . PROSTATE BIOPSY  <2013 X 3  . SPLENECTOMY  1955  . TONSILLECTOMY    . TOTAL HIP ARTHROPLASTY Right 10/2015  . UMBILICAL HERNIA REPAIR  05/2016    Family History  Problem Relation Age of Onset  . Cancer Mother   . Heart disease Father   . Stroke Father     Social History   Socioeconomic History  . Marital status: Married    Spouse name: Not on file  . Number of children: Not on file  . Years of education: Not on file  . Highest education level: Not on file  Occupational History  . Not on file  Social Needs   . Financial resource strain: Not on file  . Food insecurity    Worry: Not on file    Inability: Not on file  . Transportation needs    Medical: Not on file    Non-medical: Not on file  Tobacco Use  . Smoking status: Former Smoker    Packs/day: 1.50    Years: 54.00    Pack years: 81.00    Types: Cigarettes    Start date: 02/21/1961    Quit date: 09/27/2014    Years since quitting: 4.7  . Smokeless tobacco: Never Used  . Tobacco comment: 05/02/2017 "quit vaping 09/2016"  Substance and Sexual Activity  . Alcohol use: Yes  Alcohol/week: 17.0 standard drinks    Types: 7 Cans of beer, 10 Shots of liquor per week    Comment: Daily shots, beer etc  . Drug use: No  . Sexual activity: Not Currently  Lifestyle  . Physical activity    Days per week: Not on file    Minutes per session: Not on file  . Stress: Not on file  Relationships  . Social Herbalist on phone: Not on file    Gets together: Not on file    Attends religious service: Not on file    Active member of club or organization: Not on file    Attends meetings of clubs or organizations: Not on file    Relationship status: Not on file  . Intimate partner violence    Fear of current or ex partner: Not on file    Emotionally abused: Not on file    Physically abused: Not on file    Forced sexual activity: Not on file  Other Topics Concern  . Not on file  Social History Narrative  . Not on file    Review of systems: Review of Systems  Constitutional: Negative for fever and chills.  HENT: Negative.   Eyes: Negative for blurred vision.  Respiratory: as per HPI  Cardiovascular: Negative for chest pain and palpitations.  Gastrointestinal: Negative for vomiting, diarrhea, blood per rectum. Genitourinary: Negative for dysuria, urgency, frequency and hematuria.  Musculoskeletal: Negative for myalgias, back pain and joint pain.  Skin: Negative for itching and rash.  Neurological: Negative for dizziness, tremors,  focal weakness, seizures and loss of consciousness.  Endo/Heme/Allergies: Negative for environmental allergies.  Psychiatric/Behavioral: Negative for depression, suicidal ideas and hallucinations.  All other systems reviewed and are negative.  Physical Exam: Blood pressure (!) 142/80, pulse (!) 106, height _0  (1.702 m), weight 246 lb 12.8 oz (111.9 kg), SpO2 93 %. Gen:      No acute distress HEENT:  EOMI, sclera anicteric Neck:     No masses; no thyromegaly Lungs:    Clear to auscultation bilaterally; normal respiratory effort CV:         Regular rate and rhythm; no murmurs Abd:      + bowel sounds; soft, non-tender; no palpable masses, no distension Ext:    No edema; adequate peripheral perfusion Skin:      Warm and dry; no rash Neuro: alert and oriented x 3 Psych: normal mood and affect  Data Reviewed: Imaging: High-resolution CT 06/29/2018- Moderate emphysema. Calcified granulomas.  Subcentimeter pulmonary nodule.  Subpleural reticulation, mild traction bronchiectasis with basal gradient.  Right greater than left.  No honeycombing.  Slightly worse compared to previous CT scans. Probable UIP.  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  No significant change compared to 2019.  Enlarged PA.  Stable pulmonary nodule I have reviewed the images personally.  PFTs: 05/14/2019 FVC 2.32 [62%], FEV1 1.54 [58%], F/F 66, TLC 4.47 [69%], DLCO 13.34 [58%] Moderate restriction and diffusion defect.  Worsening FVC and DLCO compared to 2019.  Labs: CBC 04/06/2019-WBC 10.1, eos 0.8%, absolute eosinophil count 81   Sleep PSG 04/25/2000.  Severe sleep apnea, AHI 78/hr.  CPAP download 06/24/2019-100% compliance with average use 12 hours Set at CPAP of 8.  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  CT shows stable fibrosis stable though there has been a deterioration in clinical status and PFTs Discussed treatment  with patient and  suggested anti-fibrotic therapy with Ofev He is not sure if he wants therapy especially as there is no cure and only slowing down of progression.  We have decided to recheck spirometry diffusion capacity in 4 months and reevaluate.  Enlarged pulmonary artery CT with enlarged pulmonary artery.  Will schedule echocardiogram to evaluate pulmonary hypertension  Emphysema  Continue Breo, Spiriva  OSA CPAP download reviewed with good compliance and response to therapy.  Health maintenance Up-to-date with flu and pneumonia vaccine at primary care  Plan/Recommendations: -Spirometry, diffusion capacity  Follow-up in 4 months for review and plan next steps.  Marshell Garfinkel MD [facility] 07/11/2019, 2:06 PM  CC: Javier Glazier, MD

## 2019-07-11 NOTE — Patient Instructions (Signed)
We will schedule you for echocardiogram for evaluation of pulmonary hypertension Your CT scan shows pulmonary fibrosis which is stable We will schedule spirometry and diffusion capacity in 4 months and follow-up in clinic after these tests for review.

## 2019-07-13 ENCOUNTER — Other Ambulatory Visit: Payer: Self-pay

## 2019-07-13 ENCOUNTER — Ambulatory Visit (HOSPITAL_COMMUNITY): Payer: Medicare Other | Attending: Cardiology

## 2019-07-13 DIAGNOSIS — I272 Pulmonary hypertension, unspecified: Secondary | ICD-10-CM | POA: Insufficient documentation

## 2019-07-19 NOTE — Progress Notes (Signed)
Cardiology Office Note Date:  07/24/2019  Patient ID:  [name] 02/19/1941, MRN [id number] PCP:  Javier Glazier, MD  Cardiologist:  Dr. Stanford Breed (hospital consult 2019) Electrophysiologist: Dr. Rayann Heman  Pulmonologist: Dr. Marshell Garfinkel Rheumatology: Dr. Trudie Reed     Chief Complaint: annual visit  History of Present Illness: [name] is a 78 y.o. male with history of RA, prostate cancer, HTN, HLD, GERD, COPD ? (quit smoking 2015), ILD, RA (chronically on methotrexate), OSA w/CPAP.Marland Kitchen  The patient was initially seen by EP, Dr. Rayann Heman post-op after a back surgery oct 2019 when he developed an atrial tachycardia.  He was started on Flecainide by cardiology though at that time with no sustained events and minimal symptoms, not felt to warrant AAD tx and stopped with recommendations to continue his BB.  At his f/u there was c/o orthostatic dizziness, low BP readings at rehab, was cleared to reduce his metoprolol if no recurrent palpitations after 6 weeks time, his amlodipine was stopped, losartan resumed.  Urged to be compliant with his CPAP.  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  CT shows stable fibrosis stable though there has been a deterioration in clinical status and PFTs Discussed treatment with patient and suggested anti-fibrotic therapy with Ofev He is not sure if he wants therapy especially as there is no cure and only slowing down of progression.  We have decided to recheck spirometry diffusion capacity in 4 months and reevaluate." Also noted to have enlarged PA and oreded for an echo to evaluate for p.HTN  Echo noted mod elevated PA pressure >> will defer to pulmonology, LVEF 60-65%   He reports cardiac-wise her feels OK.  He is aware of his HR being up when ambulating, rarely at rest.  He recalls about 2 mo ago at night feeling his heart start to beat fast, took a few deep breaths and held it and this settled  it down.  He reports this kind of scenario happens about once every couple months and would not be inclined to adjust any medicines or therapies.  He does not feel bothered by his tachycardia His primary limitation is his lung disease, RA and back pain.  Denies any CP, no near syncope or syncope He monitors his BP and HR at home, he will get higher BP readings when he first sits down, though comes down well with rest and typically about like today's, he does not recall his HR as well.  Just got a new pulse Ox and will be able to keep a better eye on this.     Past Medical History:  Diagnosis Date  . Chronic lower back pain   . Complication of anesthesia 01/2017   was in ICU due to breathing complications-  . COPD (chronic obstructive pulmonary disease) ([place])   . Degenerative scoliosis in adult patient   . Depression   . Dyspnea    on exertion  . GERD (gastroesophageal reflux disease)   . H/O seasonal allergies   . High cholesterol   . 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 History of kidney stones   . Hypertension   . 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 OSA on CPAP    wears CPAP  . Pneumonia    "I've had it several times; last time was end of Jan 2018" (05/02/2017)  . Pre-diabetes   . Prostate cancer ([place])   .  Rheumatoid arteritis ([place])    "all over" (05/02/2017)  . Thrush 12/07/2017  . Wears glasses     Past Surgical History:  Procedure Laterality Date  . BACK SURGERY    . COLONOSCOPY W/ POLYPECTOMY    . FRACTURE SURGERY    . HERNIA REPAIR    . HIP ARTHROPLASTY Left 04/2016  . INGUINAL HERNIA REPAIR Left 1986  . JOINT REPLACEMENT    . LUMBAR WOUND DEBRIDEMENT N/A 04/18/2017   Procedure: REPAIR OF LUMBAR PSEUDOMENINGOCELE;  Surgeon: Newman Pies, MD;  Location: [facility];  Service: Neurosurgery;  Laterality: N/A;  REAIR OF LUMBAR PSEUDOMENINGOCELE  . LUMBAR WOUND DEBRIDEMENT N/A 05/05/2017   Procedure: I&D  Lumbar Wound;  Surgeon: Newman Pies, MD;  Location: [facility];  Service: Neurosurgery;  Laterality: N/A;  . MAXIMUM ACCESS (MAS)POSTERIOR LUMBAR INTERBODY FUSION (PLIF) 3 LEVEL  02/17/2017   [name] 02/17/2017  . OPEN SURGICAL REPAIR OF GLUTEAL TENDON Right 11/02/2017   Procedure: Right hip gluteal tendon repair;  Surgeon: Gaynelle Arabian, MD;  Location: [facility];  Service: Orthopedics;  Laterality: Right;  . POSTERIOR LAMINECTOMY / DECOMPRESSION LUMBAR SPINE  2010  . PROSTATE BIOPSY    . PROSTATE BIOPSY  <2013 X 3  . SPLENECTOMY  1955  . TONSILLECTOMY    . TOTAL HIP ARTHROPLASTY Right 10/2015  . UMBILICAL HERNIA REPAIR  05/2016    Current Outpatient Medications  Medication Sig Dispense Refill  . albuterol (PROVENTIL HFA;VENTOLIN HFA) 108 (90 Base) MCG/ACT inhaler Inhale 2 puffs into the lungs every 6 (six) hours as needed for wheezing or shortness of breath.    . cetirizine (ZYRTEC) 10 MG tablet Take 1 tablet by mouth as needed.    . DULoxetine (CYMBALTA) 60 MG capsule Take 60 mg by mouth every evening.     . finasteride (PROSCAR) 5 MG tablet Take 5 mg by mouth at bedtime.     . fluticasone (FLONASE) 50 MCG/ACT nasal spray Place 2 sprays into both nostrils daily as needed for allergies.     . fluticasone furoate-vilanterol (BREO ELLIPTA) 100-25 MCG/INH AEPB Inhale 1 puff into the lungs daily.    . folic acid (FOLVITE) 1 MG tablet Take 1 mg by mouth daily.    . furosemide (LASIX) 20 MG tablet Take 1 tablet (56m total) daily for next 7 days for fluid management 7 tablet 0  . gabapentin (NEURONTIN) 300 MG capsule Take 800 mg by mouth 3 (three) times daily.     .Marland Kitchenibuprofen (ADVIL) 200 MG tablet Take by mouth daily as needed. 800 - 1,200 as needed    . losartan (COZAAR) 25 MG tablet Take 1 tablet (25 mg total) by mouth daily. 90 tablet 3  . metFORMIN (GLUCOPHAGE) 500 MG tablet Take 1 tablet by mouth daily.    . methotrexate (RHEUMATREX) 2.5 MG tablet Take 2.5 mg by mouth once a week.  Caution:Chemotherapy. Protect from light.    . predniSONE (DELTASONE) 5 MG tablet Take 5 mg by mouth daily with breakfast.    . simvastatin (ZOCOR) 40 MG tablet Take 40 mg by mouth at bedtime.     . sodium chloride (OCEAN) 0.65 % SOLN nasal spray Place 1 spray into both nostrils as needed for congestion.    . tamsulosin (FLOMAX) 0.4 MG CAPS capsule Take 0.4 mg by mouth at bedtime.     . Tiotropium Bromide Monohydrate (SPIRIVA RESPIMAT) 2.5 MCG/ACT AERS Inhale 2 puffs into the lungs daily. 3 Inhaler 3  . traZODone (DESYREL) 50 MG tablet  Take 50 mg by mouth at bedtime.    . metoprolol succinate (TOPROL-XL) 25 MG 24 hr tablet Take 1 tablet (25 mg total) by mouth 2 (two) times daily. Take with or immediately following a meal. 180 tablet 3   No current facility-administered medications for this visit.     Allergies:   Sulfa antibiotics, Sulfamethoxazole-trimethoprim, Sulfasalazine, Other, Varenicline, and Lisinopril   Social History:  The patient  reports that he quit smoking about 4 years ago. His smoking use included cigarettes. He started smoking about 58 years ago. He has a 81.00 pack-year smoking history. He has never used smokeless tobacco. He reports current alcohol use of about 17.0 standard drinks of alcohol per week. He reports that he does not use drugs.   Family History:  The patient's family history includes Cancer in his mother; Heart disease in his father; Stroke in his father.  ROS:  Please see the history of present illness.  All other systems are reviewed and otherwise negative.   PHYSICAL EXAM:  VS:  BP 130/62   Pulse 92   Ht _0  (1.702 m)   Wt 222 lb (100.7 kg)   SpO2 92%   BMI 34.77 kg/m  BMI: Body mass index is 34.77 kg/m. Well nourished, well developed, in no acute distress  HEENT: normocephalic, atraumatic  Neck: no JVD, carotid bruits or masses Cardiac: irregular, tachycardic; no significant murmurs, no rubs, or gallops Lungs: CTA b/l, no wheezing, rhonchi or  rales are appreciated today  Abd: soft, nontender, obese MS: no deformity or atrophy Ext: trace edema  Skin: warm and dry, no rash Neuro:  No gross deficits appreciated Psych: euthymic mood, full affect     EKG:  Done today and reviewed by myself shows ST 116bpm, possible MAT/PACs  07/13/2019: TTE 1. Left ventricular ejection fraction, by visual estimation, is 60 to 65%. Overall LVF appears preserved but cannot comment on regional wall motion. There is no left ventricular hypertrophy.  2. Left ventricular diastolic function could not be evaluated pattern of LV diastolic filling.  3. Global right ventricle has normal systolic function.The right ventricular size is normal. No increase in right ventricular wall thickness.  4. Left atrial size was normal.  5. Right atrial size was normal.  6. The mitral valve is normal in structure. Trace mitral valve regurgitation. No evidence of mitral stenosis.  7. The tricuspid valve is normal in structure. Tricuspid valve regurgitation is trivial.  8. The aortic valve is tricuspid. There is Mild thickening of the aortic valve. There is Moderate calcification of the aortic valve. Aortic valve regurgitation was not visualized by color flow Doppler. Mild to moderate aortic valve  sclerosis/calcification without any evidence of aortic stenosis.  9. The pulmonic valve was normal in structure. Pulmonic valve regurgitation is trivial by color flow Doppler. 10. Moderately elevated pulmonary artery systolic pressure. 11. The inferior vena cava is normal in size with greater than 50% respiratory variability, suggesting right atrial pressure of 3 mmHg. 12. Recommend limited study with definity contrast for accurate assessment of LVF.    FINDINGS  Left Ventricle: Left ventricular ejection fraction, by visual estimation, is 60 to 65%. The left ventricle has normal function. There is no left ventricular hypertrophy. Normal left ventricular size. The left  ventricular diastology could not be  evaluatedsecondary to atrial fibrillation. Spectral Doppler shows Left ventricular diastolic function could not be evaluated pattern of LV diastolic filling.  Right Ventricle: The right ventricular size is normal. No increase in right  ventricular wall thickness. Global RV systolic function is has normal systolic function. The tricuspid regurgitant velocity is 2.74 m/s, and with an assumed right atrial pressure  of 10 mmHg, the estimated right ventricular systolic pressure is moderately elevated at 40.1 mmHg.  Left Atrium: Left atrial size was normal in size.  Right Atrium: Right atrial size was normal in size  Pericardium: There is no evidence of pericardial effusion.  Mitral Valve: The mitral valve is normal in structure. No evidence of mitral valve stenosis by observation. Trace mitral valve regurgitation.  Tricuspid Valve: The tricuspid valve is normal in structure. Tricuspid valve regurgitation is trivial by color flow Doppler.  Aortic Valve: The aortic valve is tricuspid. There is Mild thickening and Moderate calcification of the aortic valve. Aortic valve regurgitation was not visualized by color flow Doppler. Mild to moderate aortic valve sclerosis/calcification is present,  without any evidence of aortic stenosis. There is Mild thickening of the aortic valve. Moderate calcification.  Pulmonic Valve: The pulmonic valve was normal in structure. Pulmonic valve regurgitation is trivial by color flow Doppler.  Aorta: The aortic root, ascending aorta and aortic arch are all structurally normal, with no evidence of dilitation or obstruction.  Venous: The inferior vena cava is normal in size with greater than 50% respiratory variability, suggesting right atrial pressure of 3 mmHg.  IAS/Shunts: No atrial level shunt detected by color flow Doppler. No ventricular septal defect is seen or detected. There is no evidence of an atrial septal defect.     Recent Labs: 04/06/2019: ALT 19; BUN 18; Creatinine, Ser 0.82; Hemoglobin 13.4; NT-Pro BNP 194; Platelets 265.0; Potassium 4.1; Sodium 138  No results found for requested labs within last 8760 hours.   CrCl cannot be calculated (Patient's most recent lab result is older than the maximum 21 days allowed.).   Wt Readings from Last 3 Encounters:  07/24/19 222 lb (100.7 kg)  07/11/19 244 lb 12.8 oz (111 kg)  06/26/19 246 lb 12.8 oz (111.9 kg)     Other studies reviewed: Additional studies/records reviewed today include: summarized above  ASSESSMENT AND PLAN:  1. A Tachycardia     Largely unaware of this     He had an ekg dated 07/05/2019 that  He brigs, this looks an AT 128bpm (reportedly done as routine, was asymptomatic)     Will increase his Toprol to 30m BID     BP today looks like should tolerate ok   2. HTN     As above  3. HLD     Not addressed today     Disposition: F/u [address] if needed  Current medicines are reviewed at length with the patient today.  The patient did not have any concerns regarding medicines.  SiVenetia NightPA-C 07/24/2019 10:06 AM     [facility] [address] [place] [phone])  [phone])

## 2019-07-20 ENCOUNTER — Ambulatory Visit: Payer: Medicare Other | Admitting: Student

## 2019-07-23 ENCOUNTER — Encounter: Payer: Self-pay | Admitting: Physician Assistant

## 2019-07-24 ENCOUNTER — Encounter: Payer: Self-pay | Admitting: Physician Assistant

## 2019-07-24 ENCOUNTER — Other Ambulatory Visit: Payer: Self-pay | Admitting: Pulmonary Disease

## 2019-07-24 ENCOUNTER — Other Ambulatory Visit: Payer: Self-pay

## 2019-07-24 ENCOUNTER — Ambulatory Visit: Payer: Medicare Other | Admitting: Physician Assistant

## 2019-07-24 VITALS — BP 130/62 | HR 92 | Ht 67.0 in | Wt 222.0 lb

## 2019-07-24 DIAGNOSIS — I10 Essential (primary) hypertension: Secondary | ICD-10-CM

## 2019-07-24 DIAGNOSIS — I471 Supraventricular tachycardia: Secondary | ICD-10-CM

## 2019-07-24 MED ORDER — METOPROLOL SUCCINATE ER 50 MG PO TB24: 50.0000 mg | ORAL_TABLET | Freq: Two times a day (BID) | ORAL | 3 refills | Status: DC

## 2019-07-24 NOTE — Patient Instructions (Signed)
Medication Instructions:  Your physician has recommended you make the following change in your medication:  1. INCREASE TOPROL XL TO 50 MG TWICE DAILY.  *If you need a refill on your cardiac medications before your next appointment, please call your pharmacy*  Lab Work: NONE If you have labs (blood work) drawn today and your tests are completely normal, you will receive your results only by: Marland Kitchen MyChart Message (if you have MyChart) OR . A paper copy in the mail If you have any lab test that is abnormal or we need to change your treatment, we will call you to review the results.  Testing/Procedures: NONE  Follow-Up: At [facility], you and your health needs are our priority.  As part of our continuing mission to provide you with exceptional heart care, we have created designated Provider Care Teams.  These Care Teams include your primary Cardiologist (physician) and Advanced Practice Providers (APPs -  Physician Assistants and Nurse Practitioners) who all work together to provide you with the care you need, when you need it.  Your next appointment:   3 months  The format for your next appointment:   In Person  Provider:   You may see Tommye Standard, PA-C or one of the following Advanced Practice Providers on your designated Care Team:    Chanetta Marshall, NP  Legrand Como "Jonni Sanger" [place], [place]

## 2019-08-02 ENCOUNTER — Other Ambulatory Visit: Payer: Self-pay | Admitting: Nurse Practitioner

## 2019-08-27 ENCOUNTER — Other Ambulatory Visit: Payer: Self-pay

## 2019-08-27 ENCOUNTER — Ambulatory Visit: Payer: Medicare Other | Admitting: Infectious Disease

## 2019-08-27 ENCOUNTER — Encounter: Payer: Self-pay | Admitting: Infectious Disease

## 2019-08-27 VITALS — BP 126/90 | HR 120 | Temp 98.0°F | Ht 67.0 in | Wt 242.0 lb

## 2019-08-27 DIAGNOSIS — T84030D 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, subsequent encounter: Secondary | ICD-10-CM

## 2019-08-27 DIAGNOSIS — A498 Other bacterial infections of unspecified site: Secondary | ICD-10-CM | POA: Diagnosis not present

## 2019-08-27 DIAGNOSIS — M0579 Rheumatoid arthritis with rheumatoid factor of multiple sites without organ or systems involvement: Secondary | ICD-10-CM

## 2019-08-27 DIAGNOSIS — T847XXD Infection and inflammatory reaction due to other internal orthopedic prosthetic devices, implants and grafts, subsequent encounter: Secondary | ICD-10-CM

## 2019-08-27 DIAGNOSIS — M4620 Osteomyelitis of vertebra, site unspecified: Secondary | ICD-10-CM | POA: Diagnosis not present

## 2019-08-27 NOTE — Progress Notes (Signed)
Subjective:   Chief complaint: Bilateral hip pain   [name], male    DOB: 28-Jun-1941, 78 y.o.   MRN: [id number]  HPI 78y.o.malewith RA previously on Remicaid with recent lumbar surgery with hardware placement complicated by CSF leak and deep infection with pseudomonas.  The patient had ESR, CRP have come down.  Many visits ago  he was having more significant hip pain than back pain. He was subsequently seen by Dr. Maureen Ralphs who performed an MRI which showed evidence of gluteal tendon pathology. There is also a fluid collection which was apparently aspirated and sent for culture which did not reveal any organisms noted she he was on ciprofloxacin when this occurred. He was taken to the operating room by Dr. Maureen Ralphs who performed right gluteal tendon repair.  Dr. [name] on 07/10/18 performed posterior lumbar interbody fusion, interbody prosthesis L1-2, T9-L5. Op Note reviewed - no purulence or necrotic tissue noted to suggest gross infection. intraop cultures were negative, again he was taking cipro at this time.   He was seen byStephanie Dixon, [facility] in December 2019 and requested consideration to stop his Cipro. Since this time hehas stopped antibiotics and continued to work with PT with his recovery. He had some "pulling" sensation to the midback sometimes and some pain down the right hamstring but he wonders if this is more due to the therapy. He hadno fevers/chills. Pain is stable and has been able to push more with therapy. His incision is well healed and w/o any concerns for infection.He was seen again in February by Colletta Maryland and was doing well again his inflammatory markers were also reassuring.   6 months after stopping antibiotics he was with stable symptoms   When I saw him we continued to elect to observe him off of antibiotics.  We checked labs and his CRP was dramatically elevated compared to past CRP lab values although his sed rate was  still in the normal range at 18.  In talking to him over the phone a months ago and when we connected does not sound like he has worsening lower back pain beyond the part that he attributes to musculoskeletal problem.  He hadan upcoming appointment with neurosurgery and will have plain films with them and an evaluation.   Currently he was told that everything was relatively stable.  He does continue to have some hip pain but back pain is largely only coming on with stressors.       Past Medical History:  Diagnosis Date  . Chronic lower back pain   . Complication of anesthesia 01/2017   was in ICU due to breathing complications-  . COPD (chronic obstructive pulmonary disease) ([place])   . Degenerative scoliosis in adult patient   . Depression   . Dyspnea    on exertion  . GERD (gastroesophageal reflux disease)   . H/O seasonal allergies   . High cholesterol   . 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 History of kidney stones   . Hypertension   . 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 OSA on CPAP    wears CPAP  . Pneumonia    "I've had it several times; last time was end of Jan 2018" (05/02/2017)  . Pre-diabetes   . Prostate cancer ([place])   . Rheumatoid arteritis ([place])    "all over" (05/02/2017)  . Thrush 12/07/2017  . Wears glasses     Past Surgical History:  Procedure Laterality Date  . BACK SURGERY    . COLONOSCOPY W/ POLYPECTOMY    . FRACTURE SURGERY    . HERNIA REPAIR    . HIP ARTHROPLASTY Left 04/2016  . INGUINAL HERNIA REPAIR Left 1986  . JOINT REPLACEMENT    . LUMBAR WOUND DEBRIDEMENT N/A 04/18/2017   Procedure: REPAIR OF LUMBAR PSEUDOMENINGOCELE;  Surgeon: Newman Pies, MD;  Location: [facility];  Service: Neurosurgery;  Laterality: N/A;  REAIR OF LUMBAR PSEUDOMENINGOCELE  . LUMBAR WOUND DEBRIDEMENT N/A 05/05/2017   Procedure: I&D Lumbar Wound;  Surgeon: Newman Pies, MD;  Location: [facility];  Service:  Neurosurgery;  Laterality: N/A;  . MAXIMUM ACCESS (MAS)POSTERIOR LUMBAR INTERBODY FUSION (PLIF) 3 LEVEL  02/17/2017   [name] 02/17/2017  . OPEN SURGICAL REPAIR OF GLUTEAL TENDON Right 11/02/2017   Procedure: Right hip gluteal tendon repair;  Surgeon: Gaynelle Arabian, MD;  Location: [facility];  Service: Orthopedics;  Laterality: Right;  . POSTERIOR LAMINECTOMY / DECOMPRESSION LUMBAR SPINE  2010  . PROSTATE BIOPSY    . PROSTATE BIOPSY  <2013 X 3  . SPLENECTOMY  1955  . TONSILLECTOMY    . TOTAL HIP ARTHROPLASTY Right 10/2015  . UMBILICAL HERNIA REPAIR  05/2016    Family History  Problem Relation Age of Onset  . Cancer Mother   . Heart disease Father   . Stroke Father       Social History   Socioeconomic History  . Marital status: Married    Spouse name: Not on file  . Number of children: Not on file  . Years of education: Not on file  . Highest education level: Not on file  Occupational History  . Not on file  Social Needs  . Financial resource strain: Not on file  . Food insecurity    Worry: Not on file    Inability: Not on file  . Transportation needs    Medical: Not on file    Non-medical: Not on file  Tobacco Use  . Smoking status: Former Smoker    Packs/day: 1.50    Years: 54.00    Pack years: 81.00    Types: Cigarettes    Start date: 02/21/1961    Quit date: 09/27/2014    Years since quitting: 4.9  . Smokeless tobacco: Never Used  . Tobacco comment: 05/02/2017 "quit vaping 09/2016"  Substance and Sexual Activity  . Alcohol use: Yes    Alcohol/week: 17.0 standard drinks    Types: 7 Cans of beer, 10 Shots of liquor per week    Comment: Daily shots, beer etc  . Drug use: No  . Sexual activity: Not Currently  Lifestyle  . Physical activity    Days per week: Not on file    Minutes per session: Not on file  . Stress: Not on file  Relationships  . Social Herbalist on phone: Not on file    Gets together: Not on file    Attends religious service: Not on file     Active member of club or organization: Not on file    Attends meetings of clubs or organizations: Not on file    Relationship status: Not on file  Other Topics Concern  . Not on file  Social History Narrative  . Not on file    Allergies  Allergen Reactions  . Sulfa Antibiotics Shortness Of Breath  . Sulfamethoxazole-Trimethoprim Shortness Of Breath  . Sulfasalazine Shortness Of Breath  . Other Other (See Comments)  . Varenicline Other (  See Comments)    Strange thoughts Strange thoughts SUICIDAL  . Lisinopril Cough     Current Outpatient Medications:  .  albuterol (PROVENTIL HFA;VENTOLIN HFA) 108 (90 Base) MCG/ACT inhaler, Inhale 2 puffs into the lungs every 6 (six) hours as needed for wheezing or shortness of breath., Disp: , Rfl:  .  BREO ELLIPTA 100-25 MCG/INH AEPB, INHALE 1 PUFF INTO LUNGS DAILY, Disp: 180 each, Rfl: 1 .  cetirizine (ZYRTEC) 10 MG tablet, Take 1 tablet by mouth as needed., Disp: , Rfl:  .  DULoxetine (CYMBALTA) 60 MG capsule, Take 60 mg by mouth every evening. , Disp: , Rfl:  .  finasteride (PROSCAR) 5 MG tablet, Take 5 mg by mouth at bedtime. , Disp: , Rfl:  .  fluticasone (FLONASE) 50 MCG/ACT nasal spray, Place 2 sprays into both nostrils daily as needed for allergies. , Disp: , Rfl:  .  folic acid (FOLVITE) 1 MG tablet, Take 1 mg by mouth daily., Disp: , Rfl:  .  furosemide (LASIX) 20 MG tablet, Take 1 tablet (41m total) daily for next 7 days for fluid management, Disp: 7 tablet, Rfl: 0 .  gabapentin (NEURONTIN) 300 MG capsule, Take 800 mg by mouth 3 (three) times daily. , Disp: , Rfl:  .  ibuprofen (ADVIL) 200 MG tablet, Take by mouth daily as needed. 800 - 1,200 as needed, Disp: , Rfl:  .  losartan (COZAAR) 25 MG tablet, TAKE ONE (1) TABLET BY MOUTH EACH DAY, Disp: 90 tablet, Rfl: 3 .  metFORMIN (GLUCOPHAGE) 500 MG tablet, Take 1 tablet by mouth daily., Disp: , Rfl:  .  methotrexate (RHEUMATREX) 2.5 MG tablet, Take 2.5 mg by mouth once a week.  Caution:Chemotherapy. Protect from light., Disp: , Rfl:  .  metoprolol succinate (TOPROL-XL) 50 MG 24 hr tablet, Take 1 tablet (50 mg total) by mouth 2 (two) times daily. Take with or immediately following a meal., Disp: 180 tablet, Rfl: 3 .  predniSONE (DELTASONE) 5 MG tablet, Take 5 mg by mouth daily with breakfast., Disp: , Rfl:  .  simvastatin (ZOCOR) 40 MG tablet, Take 40 mg by mouth at bedtime. , Disp: , Rfl:  .  sodium chloride (OCEAN) 0.65 % SOLN nasal spray, Place 1 spray into both nostrils as needed for congestion., Disp: , Rfl:  .  tamsulosin (FLOMAX) 0.4 MG CAPS capsule, Take 0.4 mg by mouth at bedtime. , Disp: , Rfl:  .  Tiotropium Bromide Monohydrate (SPIRIVA RESPIMAT) 2.5 MCG/ACT AERS, Inhale 2 puffs into the lungs daily., Disp: 3 Inhaler, Rfl: 3 .  traZODone (DESYREL) 50 MG tablet, Take 50 mg by mouth at bedtime., Disp: , Rfl:      Review of Systems  Constitutional: Negative for chills, diaphoresis and fever.  HENT: Negative for congestion, hearing loss and tinnitus.   Respiratory: Negative for cough and wheezing.   Cardiovascular: Negative for chest pain, palpitations and leg swelling.  Gastrointestinal: Negative for abdominal pain, blood in stool, constipation, diarrhea, nausea and vomiting.  Genitourinary: Negative for dysuria, flank pain and hematuria.  Musculoskeletal: Positive for arthralgias, back pain and myalgias.  Skin: Negative for rash.  Neurological: Negative for dizziness, weakness and headaches.  Hematological: Does not bruise/bleed easily.  Psychiatric/Behavioral: Negative for suicidal ideas. The patient is not nervous/anxious.        Objective:   Physical Exam  Constitutional: He is oriented to person, place, and time. He appears well-developed and well-nourished. No distress.  HENT:  Head: Normocephalic and atraumatic.  Eyes: Conjunctivae  and EOM are normal. No scleral icterus.  Neck: Normal range of motion. Neck supple.  Cardiovascular: Normal rate  and regular rhythm.  Pulmonary/Chest: Effort normal. No respiratory distress. He has no wheezes.  Abdominal: He exhibits distension.  Musculoskeletal:        General: No tenderness or edema.  Neurological: He is alert and oriented to person, place, and time. He exhibits normal muscle tone. Coordination normal.  Skin: Skin is warm and dry. No rash noted. He is not diaphoretic. No erythema. No pallor.  Psychiatric: His behavior is normal. Judgment and thought content normal. He exhibits a depressed mood.          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  We will recheck his inflammatory markers today and if they are normal he can come back and see [place] as needed   RA: he is seeing Dr. Trudie Reed.    Gluteal tendon tear: This was thought to be due to context of him having had a hip replacement surgery and potential damage to the tendon at that time.   Prevention of COVID-19: He is continue to try to shelter in place currently living in a retirement home where there have been some outbreaks of COVID-19 largely related to healthcare workers who contracted it from the relatives.

## 2019-08-28 LAB — CBC WITH DIFFERENTIAL/PLATELET
Absolute Monocytes: 1555 cells/uL — ABNORMAL HIGH (ref 200–950)
Basophils Absolute: 40 cells/uL (ref 0–200)
Basophils Relative: 0.4 %
Eosinophils Absolute: 101 cells/uL (ref 15–500)
Eosinophils Relative: 1 %
HCT: 43.8 % (ref 38.5–50.0)
Hemoglobin: 14.9 g/dL (ref 13.2–17.1)
Lymphs Abs: 1606 cells/uL (ref 850–3900)
MCH: 35.5 pg — ABNORMAL HIGH (ref 27.0–33.0)
MCHC: 34 g/dL (ref 32.0–36.0)
MCV: 104.3 fL — ABNORMAL HIGH (ref 80.0–100.0)
MPV: 9.7 fL (ref 7.5–12.5)
Monocytes Relative: 15.4 %
Neutro Abs: 6797 cells/uL (ref 1500–7800)
Neutrophils Relative %: 67.3 %
Platelets: 277 10*3/uL (ref 140–400)
RBC: 4.2 10*6/uL (ref 4.20–5.80)
RDW: 12.4 % (ref 11.0–15.0)
Total Lymphocyte: 15.9 %
WBC: 10.1 10*3/uL (ref 3.8–10.8)

## 2019-08-28 LAB — BASIC METABOLIC PANEL
BUN: 17 mg/dL (ref 7–25)
CO2: 28 mmol/L (ref 20–32)
Calcium: 9.4 mg/dL (ref 8.6–10.3)
Chloride: 99 mmol/L (ref 98–110)
Creat: 0.93 mg/dL (ref 0.70–1.18)
Glucose, Bld: 128 mg/dL — ABNORMAL HIGH (ref 65–99)
Potassium: 4.3 mmol/L (ref 3.5–5.3)
Sodium: 138 mmol/L (ref 135–146)

## 2019-08-28 LAB — SEDIMENTATION RATE: Sed Rate: 14 mm/h (ref 0–20)

## 2019-08-28 LAB — C-REACTIVE PROTEIN: CRP: 2.8 mg/L (ref <8.0)

## 2019-10-21 NOTE — Progress Notes (Deleted)
Cardiology Office Note Date:  10/21/2019  Patient ID:  Michael, Buchanan 1941-06-15, MRN [id number] PCP:  Javier Glazier, MD  Cardiologist:  Dr. Stanford Breed (hospital consult 2019) Electrophysiologist: Dr. Rayann Heman  Pulmonologist: Dr. Marshell Garfinkel Rheumatology: Dr. Trudie Reed     Chief Complaint:  *** planned f/u visit  History of Present Illness: [name] is a 79 y.o. male with history of RA, prostate cancer, HTN, HLD, GERD, COPD ? (quit smoking 2015), ILD, RA (chronically on methotrexate), OSA w/CPAP.  The patient was initially seen by EP, Dr. Rayann Heman post-op after a back surgery  oct 2019 when he developed an atrial tachycardia.  He was started on Flecainide by cardiology though at that time with no sustained events and minimal symptoms, not felt to warrant AAD tx and stopped with recommendations to continue his BB.  At his f/u there was c/o orthostatic dizziness, low BP readings at rehab, was cleared to reduce his metoprolol if no recurrent palpitations after 6 weeks time, his amlodipine was stopped, losartan resumed.  Urged to be compliant with his CPAP.  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  CT shows stable fibrosis stable though there has been a deterioration in clinical status and PFTs Discussed treatment with patient and suggested anti-fibrotic therapy with Ofev He is not sure if he wants therapy especially as there is no cure and only slowing down of progression.  We have decided to recheck spirometry diffusion capacity in 4 months and reevaluate." Also noted to have enlarged PA and oreded for an echo to evaluate for p.HTN  Echo noted mod elevated PA pressure >> will defer to pulmonology, LVEF 60-65%   I saw him Oct 2020, he reported feeling OK.  He was aware of his HR being up when ambulating, rarely at rest.  He recalled about 2 mo ago at night feeling his heart start to beat fast, took a few deep breaths and held it and this  settled it down.  He reported this kind of scenario happens about once every couple months and would not be inclined to adjust any medicines or therapies.  He was not feel bothered by his tachycardia His primary limitation was his lung disease, RA and back pain.  He denied any CP, no near syncope or syncope He monitors his BP and HR at home, he will get higher BP readings when he first sits down, though comes down well with rest and typically about like today's, he did not recall any particular HR reading.  Just got a new pulse Ox and will be able to keep a better eye on this.  EKG at this visit was SR/possible MAT/PACs, prior EKG reviewed looked liked an AT and his Toprol was increased, his BP felt to have room. Panned for 3 mo follow up.  He saw ID, 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  *** palps?  BP OK with toprol? *** meds *** ID?? Osteo back     Past Medical History:  Diagnosis Date  . Chronic lower back pain   . Complication of anesthesia 01/2017   was in ICU due to breathing complications-  . COPD (chronic obstructive pulmonary disease) ([place])   . Degenerative scoliosis in adult patient   . Depression   . Dyspnea    on exertion  . GERD (gastroesophageal reflux disease)   .  H/O seasonal allergies   . High cholesterol   . 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 History of kidney stones   . Hypertension   . 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 OSA on CPAP    wears CPAP  . Pneumonia    "I've had it several times; last time was end of Jan 2018" (05/02/2017)  . Pre-diabetes   . Prostate cancer ([name])   . Rheumatoid arteritis ([place])    "all over" (05/02/2017)  . Thrush 12/07/2017  . Wears glasses     Past Surgical History:  Procedure Laterality Date  . BACK SURGERY      . COLONOSCOPY W/ POLYPECTOMY    . FRACTURE SURGERY    . HERNIA REPAIR    . HIP ARTHROPLASTY Left 04/2016  . INGUINAL HERNIA REPAIR Left 1986  . JOINT REPLACEMENT    . LUMBAR WOUND DEBRIDEMENT N/A 04/18/2017   Procedure: REPAIR OF LUMBAR PSEUDOMENINGOCELE;  Surgeon: Newman Pies, MD;  Location: [facility];  Service: Neurosurgery;  Laterality: N/A;  REAIR OF LUMBAR PSEUDOMENINGOCELE  . LUMBAR WOUND DEBRIDEMENT N/A 05/05/2017   Procedure: I&D Lumbar Wound;  Surgeon: Newman Pies, MD;  Location: [facility] [place];  Service: Neurosurgery;  Laterality: N/A;  . MAXIMUM ACCESS (MAS)POSTERIOR LUMBAR INTERBODY FUSION (PLIF) 3 LEVEL  02/17/2017   [name] 02/17/2017  . OPEN SURGICAL REPAIR OF GLUTEAL TENDON Right 11/02/2017   Procedure: Right hip gluteal tendon repair;  Surgeon: Gaynelle Arabian, MD;  [facility];  Service: Orthopedics;  Laterality: Right;  . POSTERIOR LAMINECTOMY / DECOMPRESSION LUMBAR SPINE  2010  . PROSTATE BIOPSY    . PROSTATE BIOPSY  <2013 X 3  . SPLENECTOMY  1955  . TONSILLECTOMY    . TOTAL HIP ARTHROPLASTY Right 10/2015  . UMBILICAL HERNIA REPAIR  05/2016    Current Outpatient Medications  Medication Sig Dispense Refill  . albuterol (PROVENTIL HFA;VENTOLIN HFA) 108 (90 Base) MCG/ACT inhaler Inhale 2 puffs into the lungs every 6 (six) hours as needed for wheezing or shortness of breath.    Marland Kitchen BREO ELLIPTA 100-25 MCG/INH AEPB INHALE 1 PUFF INTO LUNGS DAILY 180 each 1  . cetirizine (ZYRTEC) 10 MG tablet Take 1 tablet by mouth as needed.    . DULoxetine (CYMBALTA) 60 MG capsule Take 60 mg by mouth every evening.     . finasteride (PROSCAR) 5 MG tablet Take 5 mg by mouth at bedtime.     . fluticasone (FLONASE) 50 MCG/ACT nasal spray Place 2 sprays into both nostrils daily as needed for allergies.     . folic acid (FOLVITE) 1 MG tablet Take 1 mg by mouth daily.    . furosemide (LASIX) 20 MG tablet Take 1 tablet (72m total) daily for next 7 days for fluid management 7 tablet 0  .  gabapentin (NEURONTIN) 300 MG capsule Take 800 mg by mouth 3 (three) times daily.     .Marland Kitchenibuprofen (ADVIL) 200 MG tablet Take by mouth daily as needed. 800 - 1,200 as needed    . losartan (COZAAR) 25 MG tablet TAKE ONE (1) TABLET BY MOUTH EACH DAY 90 tablet 3  . metFORMIN (GLUCOPHAGE) 500 MG tablet Take 1 tablet by mouth daily.    . methotrexate (RHEUMATREX) 2.5 MG tablet Take 2.5 mg by mouth once a week. Caution:Chemotherapy. Protect from light.    . metoprolol succinate (TOPROL-XL) 50 MG 24 hr tablet Take 1 tablet (50 mg total) by mouth 2 (  two) times daily. Take with or immediately following a meal. 180 tablet 3  . predniSONE (DELTASONE) 5 MG tablet Take 5 mg by mouth daily with breakfast.    . simvastatin (ZOCOR) 40 MG tablet Take 40 mg by mouth at bedtime.     . sodium chloride (OCEAN) 0.65 % SOLN nasal spray Place 1 spray into both nostrils as needed for congestion.    . tamsulosin (FLOMAX) 0.4 MG CAPS capsule Take 0.4 mg by mouth at bedtime.     . Tiotropium Bromide Monohydrate (SPIRIVA RESPIMAT) 2.5 MCG/ACT AERS Inhale 2 puffs into the lungs daily. 3 Inhaler 3  . traZODone (DESYREL) 50 MG tablet Take 50 mg by mouth at bedtime.     No current facility-administered medications for this visit.    Allergies:   Sulfa antibiotics, Sulfamethoxazole-trimethoprim, Sulfasalazine, Other, Varenicline, and Lisinopril   Social History:  The patient  reports that he quit smoking about 5 years ago. His smoking use included cigarettes. He started smoking about 58 years ago. He has a 81.00 pack-year smoking history. He has never used smokeless tobacco. He reports current alcohol use of about 17.0 standard drinks of alcohol per week. He reports that he does not use drugs.   Family History:  The patient's family history includes Cancer in his mother; Heart disease in his father; Stroke in his father.  ROS:  Please see the history of present illness.  All other systems are reviewed and otherwise negative.    PHYSICAL EXAM:  VS:  There were no vitals taken for this visit. BMI: There is no height or weight on file to calculate BMI. Well nourished, well developed, in no acute distress  HEENT: normocephalic, atraumatic  Neck: no JVD, carotid bruits or masses Cardiac: *** irregular, tachycardic; no significant murmurs, no rubs, or gallops Lungs: *** CTA b/l, no wheezing, rhonchi or rales are appreciated today  Abd: soft, nontender, obese MS: no deformity or *** atrophy Ext: *** trace edema  Skin: warm and dry, no rash Neuro:  No gross deficits appreciated Psych: euthymic mood, full affect     EKG:  Not done today  07/13/2019: TTE 1. Left ventricular ejection fraction, by visual estimation, is 60 to 65%. Overall LVF appears preserved but cannot comment on regional wall motion. There is no left ventricular hypertrophy.  2. Left ventricular diastolic function could not be evaluated pattern of LV diastolic filling.  3. Global right ventricle has normal systolic function.The right ventricular size is normal. No increase in right ventricular wall thickness.  4. Left atrial size was normal.  5. Right atrial size was normal.  6. The mitral valve is normal in structure. Trace mitral valve regurgitation. No evidence of mitral stenosis.  7. The tricuspid valve is normal in structure. Tricuspid valve regurgitation is trivial.  8. The aortic valve is tricuspid. There is Mild thickening of the aortic valve. There is Moderate calcification of the aortic valve. Aortic valve regurgitation was not visualized by color flow Doppler. Mild to moderate aortic valve  sclerosis/calcification without any evidence of aortic stenosis.  9. The pulmonic valve was normal in structure. Pulmonic valve regurgitation is trivial by color flow Doppler. 10. Moderately elevated pulmonary artery systolic pressure. 11. The inferior vena cava is normal in size with greater than 50% respiratory variability, suggesting right atrial  pressure of 3 mmHg. 12. Recommend limited study with definity contrast for accurate assessment of LVF.    FINDINGS  Left Ventricle: Left ventricular ejection fraction, by visual estimation, is  60 to 65%. The left ventricle has normal function. There is no left ventricular hypertrophy. Normal left ventricular size. The left ventricular diastology could not be  evaluatedsecondary to atrial fibrillation. Spectral Doppler shows Left ventricular diastolic function could not be evaluated pattern of LV diastolic filling.  Right Ventricle: The right ventricular size is normal. No increase in right ventricular wall thickness. Global RV systolic function is has normal systolic function. The tricuspid regurgitant velocity is 2.74 m/s, and with an assumed right atrial pressure  of 10 mmHg, the estimated right ventricular systolic pressure is moderately elevated at 40.1 mmHg.  Left Atrium: Left atrial size was normal in size.  Right Atrium: Right atrial size was normal in size  Pericardium: There is no evidence of pericardial effusion.  Mitral Valve: The mitral valve is normal in structure. No evidence of mitral valve stenosis by observation. Trace mitral valve regurgitation.  Tricuspid Valve: The tricuspid valve is normal in structure. Tricuspid valve regurgitation is trivial by color flow Doppler.  Aortic Valve: The aortic valve is tricuspid. There is Mild thickening and Moderate calcification of the aortic valve. Aortic valve regurgitation was not visualized by color flow Doppler. Mild to moderate aortic valve sclerosis/calcification is present,  without any evidence of aortic stenosis. There is Mild thickening of the aortic valve. Moderate calcification.  Pulmonic Valve: The pulmonic valve was normal in structure. Pulmonic valve regurgitation is trivial by color flow Doppler.  Aorta: The aortic root, ascending aorta and aortic arch are all structurally normal, with no evidence of  dilitation or obstruction.  Venous: The inferior vena cava is normal in size with greater than 50% respiratory variability, suggesting right atrial pressure of 3 mmHg.  IAS/Shunts: No atrial level shunt detected by color flow Doppler. No ventricular septal defect is seen or detected. There is no evidence of an atrial septal defect.    Recent Labs: 04/06/2019: ALT 19; NT-Pro BNP 194 08/27/2019: BUN 17; Creat 0.93; Hemoglobin 14.9; Platelets 277; Potassium 4.3; Sodium 138  No results found for requested labs within last 8760 hours.   CrCl cannot be calculated (Patient's most recent lab result is older than the maximum 21 days allowed.).   Wt Readings from Last 3 Encounters:  08/27/19 242 lb (109.8 kg)  07/24/19 222 lb (100.7 kg)  07/11/19 244 lb 12.8 oz (111 kg)     Other studies reviewed: Additional studies/records reviewed today include: summarized above  ASSESSMENT AND PLAN:  1. A Tachycardia     Largely unaware of this     ***  2. HTN     ***  3. HLD     ***     Disposition: ***   Current medicines are reviewed at length with the patient today.  The patient did not have any concerns regarding medicines.  Venetia Night, PA-C 10/21/2019 9:51 AM     [facility] [address] [place] [address] [phone] (office)  [phone] (fax)

## 2019-10-23 ENCOUNTER — Ambulatory Visit: Payer: Medicare Other | Admitting: Physician Assistant

## 2019-10-29 ENCOUNTER — Telehealth: Payer: Self-pay | Admitting: Pulmonary Disease

## 2019-10-29 NOTE — Telephone Encounter (Signed)
Called the patient back and he stated he does know why his heart rate has been increasing and O2 dropping. Patient stated he was walking in the home when O2 dropped to 81% with heart rate of 135. He stated it has been going on for the last 2 - 3 days. Will have to use his rescue inhaler and focus on taking a deep breath in order to recover. But that will take 1 -2 minutes of deep breathing to improve O2 to 93% - 94%  Patient stated he is still using Spiriva and Breo daily. But also using rescue 4 times daily. Still using 4L O2 at night. Patient scheduled for video visit with Derl Barrow, NP 10/30/19 at 10:30. Patient already scheduled for PFT with OV same day on 11/09/19.  Nothing further needed at this time.

## 2019-10-30 ENCOUNTER — Other Ambulatory Visit: Payer: Self-pay

## 2019-10-30 ENCOUNTER — Telehealth (INDEPENDENT_AMBULATORY_CARE_PROVIDER_SITE_OTHER): Payer: Medicare PPO | Admitting: Primary Care

## 2019-10-30 ENCOUNTER — Ambulatory Visit (HOSPITAL_COMMUNITY)
Admission: RE | Admit: 2019-10-30 | Discharge: 2019-10-30 | Disposition: A | Discharge status: Home / Self Care | Payer: Medicare PPO | Source: Ambulatory Visit | Attending: Primary Care | Admitting: Primary Care

## 2019-10-30 ENCOUNTER — Other Ambulatory Visit: Payer: Self-pay | Admitting: General Surgery

## 2019-10-30 ENCOUNTER — Encounter: Payer: Self-pay | Admitting: Primary Care

## 2019-10-30 DIAGNOSIS — R0602 Shortness of breath: Secondary | ICD-10-CM | POA: Diagnosis not present

## 2019-10-30 DIAGNOSIS — R079 Chest pain, unspecified: Secondary | ICD-10-CM | POA: Insufficient documentation

## 2019-10-30 DIAGNOSIS — R7989 Other specified abnormal findings of blood chemistry: Secondary | ICD-10-CM | POA: Insufficient documentation

## 2019-10-30 LAB — CBC WITH DIFFERENTIAL/PLATELET
Basophils Absolute: 0.1 10*3/uL (ref 0.0–0.1)
Basophils Relative: 0.5 % (ref 0.0–3.0)
Eosinophils Absolute: 0.2 10*3/uL (ref 0.0–0.7)
Eosinophils Relative: 1.8 % (ref 0.0–5.0)
HCT: 43.2 % (ref 39.0–52.0)
Hemoglobin: 13.9 g/dL (ref 13.0–17.0)
Lymphocytes Relative: 19.3 % (ref 12.0–46.0)
Lymphs Abs: 2.5 10*3/uL (ref 0.7–4.0)
MCHC: 32.1 g/dL (ref 30.0–36.0)
MCV: 108.1 fl — ABNORMAL HIGH (ref 78.0–100.0)
Monocytes Absolute: 1.6 10*3/uL — ABNORMAL HIGH (ref 0.1–1.0)
Monocytes Relative: 11.8 % (ref 3.0–12.0)
Neutro Abs: 8.7 10*3/uL — ABNORMAL HIGH (ref 1.4–7.7)
Neutrophils Relative %: 66.6 % (ref 43.0–77.0)
Platelets: 295 10*3/uL (ref 150.0–400.0)
RBC: 4 Mil/uL — ABNORMAL LOW (ref 4.22–5.81)
RDW: 14.5 % (ref 11.5–15.5)
WBC: 13.1 10*3/uL — ABNORMAL HIGH (ref 4.0–10.5)

## 2019-10-30 LAB — COMPREHENSIVE METABOLIC PANEL
ALT: 15 U/L (ref 0–53)
AST: 19 U/L (ref 0–37)
Albumin: 3.8 g/dL (ref 3.5–5.2)
Alkaline Phosphatase: 67 U/L (ref 39–117)
BUN: 12 mg/dL (ref 6–23)
CO2: 32 mEq/L (ref 19–32)
Calcium: 9.6 mg/dL (ref 8.4–10.5)
Chloride: 97 mEq/L (ref 96–112)
Creatinine, Ser: 1.05 mg/dL (ref 0.40–1.50)
GFR: 68.43 mL/min (ref >60.00)
Glucose, Bld: 113 mg/dL — ABNORMAL HIGH (ref 70–99)
Potassium: 4 mEq/L (ref 3.5–5.1)
Sodium: 139 mEq/L (ref 135–145)
Total Bilirubin: 0.4 mg/dL (ref 0.2–1.2)
Total Protein: 7.3 g/dL (ref 6.0–8.3)

## 2019-10-30 LAB — BRAIN NATRIURETIC PEPTIDE: Pro B Natriuretic peptide (BNP): 146 pg/mL — ABNORMAL HIGH (ref 0.0–100.0)

## 2019-10-30 LAB — D-DIMER, QUANTITATIVE: D-Dimer, Quant: 1.82 mcg/mL FEU — ABNORMAL HIGH (ref <0.50)

## 2019-10-30 MED ORDER — PREDNISONE 10 MG PO TABS: ORAL_TABLET | ORAL | 0 refills | Status: DC

## 2019-10-30 MED ORDER — IOHEXOL 350 MG/ML SOLN
80.0000 mL | Freq: Once | INTRAVENOUS | Status: AC | PRN
  Administered 2019-10-30: 80 mL via INTRAVENOUS

## 2019-10-30 NOTE — Progress Notes (Addendum)
Spoke with patient myself, reviewed lab results including elevated D-dimer. Recommend stat CTA to rule of PE or other causes. WBC elevated, no obvious signs of bacterial infection and not currently on prednisone. Given RX prednisone taper today which he started after labs were drawn. BNP was mildly elevated, will address this after CT scan if relevant. Patient aware if symptoms worsen or develops sudden shortness of breath or CP to present to ED.

## 2019-10-30 NOTE — Progress Notes (Addendum)
Virtual Visit via Video Note  I connected with [name] on 10/30/19 at 10:30 AM EST by a video enabled telemedicine application and verified that I am speaking with the correct person using two identifiers.  Location: Patient: Home Provider: Office   I discussed the limitations of evaluation and management by telemedicine and the availability of in person appointments. The patient expressed understanding and agreed to proceed.  History of Present Illness: 79 year old male, former smoker. PMH significant for COPD GOLD II/emphysema, allergic rhinitis, ILD, RA, OSA on CPAP. Patient of Dr. Vaughan Buchanan, last seen on 07/11/19. Maintained on Breo and Spiriva. Plan recheck spirometry diffusion capacity in 4 months and re-evaluate antifibrotics at that time.   10/30/2019 Patient contacted today for virtual video visit. Reports increased shortness of breath 2-3 days and chest tightness when he wakes up in the morning. Rare cough and post nasal drip. Experiences left upper chest tightness in the morning but states that he works that out with moving around, massage and coughing helps. His O2 levels have been decreased into 70-80s with exertion and HR in 130. He recovers in 2-3 mins. O2 at rest is 90's on room air. He does have oxygen at home but not currently using it. Some weight gain for 4-5 lbs. Denies fever, chills, HA, sore throat, change in smell or taste.    Observations/Objective:  Patient reported Vital signs: - O2 90 RA at rest - O2 70-80s RA on exertion   - HR at rest 65-70  - HR on exertion increases to 130-140   Data Reviewed: Imaging: High-resolution CT 06/29/2018- Moderate emphysema. Calcified granulomas.  Subcentimeter pulmonary nodule.  Subpleural reticulation, mild traction bronchiectasis with basal gradient.  Right greater than left.  No honeycombing.  Slightly worse compared to previous CT scans. Probable UIP.  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  No significant change compared to 2019.  Enlarged PA.  Stable pulmonary nodule I have reviewed the images personally.  PFTs: 05/14/2019 FVC 2.32 [62%], FEV1 1.54 [58%], F/F 66, TLC 4.47 [69%], DLCO 13.34 [58%] Moderate restriction and diffusion defect.  Worsening FVC and DLCO compared to 2019.  Labs: CBC 04/06/2019-WBC 10.1, eos 0.8%, absolute eosinophil count 81   Sleep PSG 04/25/2000.  Severe sleep apnea, AHI 78/hr.   Assessment and Plan:  Acute on chronic respiratory failure with hypoxemia - O2 90% RA at rest  - Reports O2 reading in 70-80s RA on exertion; recovers with rest - Advised patient use 2L on exertion  - Checking D-dimer and BNP (if D-dimer >1.0 needs CTA to r/o PE)  ILD exacerbation - Rx prednisone taper (54m x 2 days; 342mx 2 days; 2034m 2 days; 52m67m2 days)  COPD/emphysema - No significant bronchitis symptoms - Continue Breo/Spiriva   Follow Up Instructions:   - ApVertis KelchFeb 12th with spirometry and DLCO with [employer] Buchanan I discussed the assessment and treatment plan with the patient. The patient was provided an opportunity to ask questions and all were answered. The patient agreed with the plan and demonstrated an understanding of the instructions.   The patient was advised to call back or seek an in-person evaluation if the symptoms worsen or if the condition fails to improve as anticipated.  I provided 30 minutes of non-face-to-face time during this encounter.   ElizMartyn Buchanan

## 2019-10-30 NOTE — Addendum Note (Signed)
Addended by: Suzzanne Cloud E on: 10/30/2019 11:43 AM   Modules accepted: Orders

## 2019-10-30 NOTE — Patient Instructions (Signed)
Acute on chronic respiratory failure with hypoxemia - Use 2L oxygen on exertion to keep O2 >88-90%   ILD exacerbation - Rx prednisone taper (47m x 2 days; 347mx 2 days; 2042m 2 days; 50m50m2 days)  COPD/emphysema - No significant bronchitis symptoms, no abx indicated at this time  - Continue Breo one puff daily  - Continue Spiriva two puffs daily   Labs: - Checking D-dimer and BNP   Follow Up Instructions:   - ApVertis KelchFeb 12th with spirometry and DLCO with [employer] Mccreedy

## 2019-10-31 NOTE — Progress Notes (Signed)
Spoke with pt and notified of results per Derl Barrow NP. Pt verbalized understanding and denied any questions.

## 2019-10-31 NOTE — Progress Notes (Signed)
Please let patient know CTA was negative for PE. No evidence of pneumonia. Coronary artery calcifications noted indicative of coronary artery disease. If HR continues to be elevated, recommend follow up with cardiology.

## 2019-11-06 ENCOUNTER — Other Ambulatory Visit (HOSPITAL_COMMUNITY)
Admission: RE | Admit: 2019-11-06 | Discharge: 2019-11-06 | Disposition: A | Discharge status: Home / Self Care | Payer: Medicare PPO | Source: Ambulatory Visit | Attending: Pulmonary Disease | Admitting: Pulmonary Disease

## 2019-11-06 DIAGNOSIS — Z20822 Contact with and (suspected) exposure to covid-19: Secondary | ICD-10-CM | POA: Insufficient documentation

## 2019-11-06 DIAGNOSIS — Z01812 Encounter for preprocedural laboratory examination: Secondary | ICD-10-CM | POA: Insufficient documentation

## 2019-11-06 LAB — SARS CORONAVIRUS 2 (TAT 6-24 HRS): SARS Coronavirus 2: NEGATIVE

## 2019-11-09 ENCOUNTER — Ambulatory Visit (INDEPENDENT_AMBULATORY_CARE_PROVIDER_SITE_OTHER): Payer: Medicare PPO | Admitting: Pulmonary Disease

## 2019-11-09 ENCOUNTER — Encounter: Payer: Self-pay | Admitting: Pulmonary Disease

## 2019-11-09 ENCOUNTER — Other Ambulatory Visit: Payer: Self-pay

## 2019-11-09 VITALS — BP 118/82 | HR 79 | Temp 97.7°F | Ht 67.0 in | Wt 246.0 lb

## 2019-11-09 DIAGNOSIS — M0579 Rheumatoid arthritis with rheumatoid factor of multiple sites without organ or systems involvement: Secondary | ICD-10-CM | POA: Diagnosis not present

## 2019-11-09 DIAGNOSIS — G4733 Obstructive sleep apnea (adult) (pediatric): Secondary | ICD-10-CM | POA: Diagnosis not present

## 2019-11-09 DIAGNOSIS — J42 Unspecified chronic bronchitis: Secondary | ICD-10-CM | POA: Diagnosis not present

## 2019-11-09 DIAGNOSIS — Z9989 Dependence on other enabling machines and devices: Secondary | ICD-10-CM

## 2019-11-09 DIAGNOSIS — J849 Interstitial pulmonary disease, unspecified: Secondary | ICD-10-CM

## 2019-11-09 LAB — PULMONARY FUNCTION TEST
DL/VA % pred: 92 %
DL/VA: 3.67 ml/min/mmHg/L
DLCO cor % pred: 64 %
DLCO cor: 14.53 ml/min/mmHg
DLCO unc % pred: 62 %
DLCO unc: 14.23 ml/min/mmHg
FEF 25-75 Post: 0.97 L/sec
FEF 25-75 Pre: 0.58 L/sec
FEF2575-%Change-Post: 65 %
FEF2575-%Pred-Post: 52 %
FEF2575-%Pred-Pre: 31 %
FEV1-%Change-Post: 16 %
FEV1-%Pred-Post: 58 %
FEV1-%Pred-Pre: 49 %
FEV1-Post: 1.52 L
FEV1-Pre: 1.31 L
FEV1FVC-%Change-Post: 7 %
FEV1FVC-%Pred-Pre: 83 %
FEV6-%Change-Post: 10 %
FEV6-%Pred-Post: 67 %
FEV6-%Pred-Pre: 61 %
FEV6-Post: 2.31 L
FEV6-Pre: 2.08 L
FEV6FVC-%Change-Post: 1 %
FEV6FVC-%Pred-Post: 105 %
FEV6FVC-%Pred-Pre: 104 %
FVC-%Change-Post: 8 %
FVC-%Pred-Post: 64 %
FVC-%Pred-Pre: 58 %
FVC-Post: 2.34 L
FVC-Pre: 2.16 L
Post FEV1/FVC ratio: 65 %
Post FEV6/FVC ratio: 99 %
Pre FEV1/FVC ratio: 61 %
Pre FEV6/FVC Ratio: 97 %
RV % pred: 76 %
RV: 1.85 L
TLC % pred: 64 %
TLC: 4.15 L

## 2019-11-09 NOTE — Progress Notes (Signed)
_0  ID: [name], male    DOB: 11-26-1940, 79 y.o.   MRN: [id number]  Chief Complaint  Patient presents with  . Follow-up    F/U after PFT. States his breathing improved while on the prednisone but he is more SOB without it now.     Referring provider: Javier Glazier, MD  HPI:  79 year old male former smoker referred to our office in 2018.  Managed in our office for COPD, interstitial lung disease, and obstructive sleep apnea (managed on CPAP).   PMH: Rheumatoid arthritis (currently on methotrexate)-followed by Dr. Trudie Reed Smoker/ Smoking History: Former Smoker, Quit 2012, 81 pack years Maintenance:  Breo Ellipta 100, Spiriva 2.5 Pt of: Dr. Vaughan Browner  11/09/2019  - Visit   79 year old male former smoker followed in our office for interstitial lung disease, obstructive sleep apnea and COPD.  Patient continues to be maintained on Breo Ellipta 100 and Spiriva Respimat 2.5.  He is presenting to office today after completing pulmonary function testing.  Those results are listed below:  11/09/2019-pulmonary function test-FVC 2.16 (58% predicted), postbronchodilator ratio 65, postbronchodilator FEV1 1.52 (58% predicted), positive bronchodilator response in FEV1, mid flow reversibility, DLCO 14.23 (62% predicted)  We will review these today.  At last office visit in October/2020 it was discussed with the patient whether or not he would like to start antifibrotic's for interstitial lung disease.  He wanted to hold off on this.  He establish care with Dr. Vaughan Browner at that time.  He reports he continues to follow-up with Dr. Trudie Reed with rheumatology.  Patient has received both Covid vaccines- Moderna.   Tests:   Pulmonary function testing: >>>October [facility] ratio 58% FEV1 1.54 L 57% predicted, FVC 2.65 L 70% predicted, total lung capacity 157% predicted residual volume 263% predicted DLCO 61% predic   07/04/2018-pulmonary function test- FVC 2.64 (71% predicted), ratio  71, FEV1 70, no significant bronchodilator response, DLCO 54  05/14/2019-pulmonary function test- FVC 2.40 (64% predicted), postbronchodilator ratio of 66, postbronchodilator FEV1 1.54 (58% predicted), no bronchodilator response, DLCO 13.34 (58% predicted)  11/09/2019-pulmonary function test-FVC 2.16 (58% predicted), postbronchodilator ratio 65, postbronchodilator FEV1 1.52 (58% predicted), positive bronchodilator response in FEV1, mid flow reversibility, DLCO 14.23 (62% predicted)  Chest imaging: March 2018 CT chest lung cancer screening: Rads 2, images, showing nonspecific scarring more in the right lung base than the left, not consistent with an interstitial lung disease 12/19/2017-CT chest lung cancer screening-lung RADS 2, follow-up in 12 months 05/30/2018-chest x-ray-stable bilateral interstitial prominence suggesting chronic interstitial lung disease 06/29/2018-CT chest high-res- spectrum of findings compatible with 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 PSG on 04/25/2000 gave an index of 78/hr.  Ever since then he has  been using CPAP at [facility]  03/13/2019-overnight oximetry test- duration 22 hours 11 minutes and 6 seconds, total time below 88% was 5 hours 29 minutes and 21 seconds, qualifying for o2  03/22/2019- overnight oximetry- on 2 L - duration of sleep 11 hours and 3 minutes, total time below 89% 1 hour and 4 minutes and 19 seconds, total time below 88% 21 minutes and 39 seconds  FENO:  No results found for: NITRICOXIDE  PFT: PFT Results Latest Ref Rng & Units 11/09/2019 05/14/2019 07/04/2018  FVC-Pre L 2.16 2.40 2.64  FVC-Predicted Pre % 58 64 71  FVC-Post L 2.34 2.32 2.59  FVC-Predicted Post % 64 62 70  Pre FEV1/FVC % % 61 66 71  Post  FEV1/FCV % % 65 66 72  FEV1-Pre L 1.31 1.57 1.87  FEV1-Predicted Pre % 49 59 70  FEV1-Post L 1.52 1.54 1.86  DLCO UNC% % 62 58 52  DLCO COR %Predicted % 92 84 78  TLC L 4.15 4.47 5.26  TLC % Predicted % 64 69 81  RV % Predicted % 76 89 109    WALK:  SIX MIN WALK 05/14/2019 02/22/2019 11/17/2016  Supplimental Oxygen during Test? (L/min) No No No  Tech Comments: The patient had to stop multiple times during his walk. He maintained the same pace, but had to be put on 4L O2 pulse and able to keep O2 at 92%. walk with pt at slow pace, pt did experience SOB and had to stop multiple times. O2 sat dropped to 86 but quickly recovered with rest. Walk stopped early due to elevevated HR of 154 and c/o slight dizziness. pt walked a moderate pace, did not complete lap 3 d/t back pain.     Imaging: CT ANGIO CHEST PE W OR WO CONTRAST  Result Date: 10/30/2019 CLINICAL DATA:  Positive D-dimer level. EXAM: CT ANGIOGRAPHY CHEST WITH CONTRAST TECHNIQUE: Multidetector CT imaging of the chest was performed using the standard protocol during bolus administration of intravenous contrast. Multiplanar CT image reconstructions and MIPs were obtained to evaluate the vascular anatomy. CONTRAST:  80 mL of Omnipaque 350 intravenously. COMPARISON:  July 02, 2019. FINDINGS: Cardiovascular: Satisfactory opacification of the pulmonary arteries to the segmental level. No evidence of pulmonary embolism. Mild cardiomegaly is noted. Coronary artery calcifications are noted. No pericardial effusion. Atherosclerosis thoracic aorta is noted without aneurysm formation. Mediastinum/Nodes: No enlarged mediastinal, hilar, or axillary lymph nodes. Thyroid gland, trachea, and esophagus demonstrate no significant findings. Lungs/Pleura: No pneumothorax or pleural effusion is noted. Minimal bilateral posterior basilar subsegmental atelectasis is noted. Upper Abdomen: No acute abnormality. Musculoskeletal: No chest wall abnormality. No acute or  significant osseous findings. Review of the MIP images confirms the above findings. IMPRESSION: 1. No definite evidence of pulmonary embolus. 2. Coronary artery calcifications are noted suggesting coronary artery disease. 3. Minimal bilateral posterior basilar subsegmental atelectasis is noted. Aortic Atherosclerosis (ICD10-I70.0). Electronically Signed   By: Marijo Conception M.D.   On: 10/30/2019 19:54    Lab Results:  CBC    Component Value Date/Time   WBC 13.1 (H) 10/30/2019 1143   RBC 4.00 (L) 10/30/2019 1143   HGB 13.9 10/30/2019 1143   HCT 43.2 10/30/2019 1143   PLT 295.0 10/30/2019 1143   MCV 108.1 (H) 10/30/2019 1143   MCH 35.5 (H) 08/27/2019 1132   MCHC 32.1 10/30/2019 1143   RDW 14.5 10/30/2019 1143   LYMPHSABS 2.5 10/30/2019 1143   MONOABS 1.6 (H) 10/30/2019 1143   EOSABS 0.2 10/30/2019 1143   BASOSABS 0.1 10/30/2019 1143    BMET    Component Value Date/Time   NA 139 10/30/2019 1143   K 4.0 10/30/2019 1143   CL 97 10/30/2019 1143   CO2 32 10/30/2019 1143   GLUCOSE 113 (H) 10/30/2019 1143   BUN 12 10/30/2019 1143   CREATININE 1.05 10/30/2019 1143   CREATININE 0.93 08/27/2019 1132   CALCIUM 9.6 10/30/2019 1143   GFRNONAA 78 02/21/2019 1125   GFRAA 91 02/21/2019 1125    BNP    Component Value Date/Time   BNP 120.0 (H) 07/13/2018 1858  ProBNP    Component Value Date/Time   PROBNP 146.0 (H) 10/30/2019 1143    Specialty Problems      Pulmonary Problems   GOLD COPD II B           OSA on CPAP    >>>N. PSG on 04/25/2000 gave an index of 78/hr.  Ever since then he has  been using [facility]AP [facility]       Allergic rhinitis    Qualifier: Diagnosis of  By: Annamaria Boots MD, Clinton D       Hypoxia   Acute sinusitis   Cough due to ACE inhibitor    06/13/18 - stopped lisinopril  06/13/18 - started losartan       Dyspnea   ILD (interstitial lung disease) ([place])    06/29/2018-CT chest high-res- spectrum of findings compatible with fibrotic interstitial lung  disease probable UIP, left upper lobe 5 mm solid pulmonary nodule, no frank honeycombing findings asymmetrically involve right lung, mild to moderate centrilobular and paraseptal emphysema, dilated main pulmonary artery suggesting PAH  07/04/2018-pulmonary function test- FVC 2.64 (71% predicted), ratio 71, FEV1 70, no significant bronchodilator response, DLCO 54         Allergies  Allergen Reactions  . Sulfa Antibiotics Shortness Of Breath  . Sulfamethoxazole-Trimethoprim Shortness Of Breath  . Sulfasalazine Shortness Of Breath  . Other Other (See Comments)  . Varenicline Other (See Comments)    Strange thoughts Strange thoughts SUICIDAL  . Lisinopril Cough    Immunization History  Administered Date(s) Administered  . 19-influenza Whole 07/18/2012, 06/14/2014, 08/12/2015, 06/03/2016  . HiB (PRP-T) 08/23/2017  . Influenza, High Dose Seasonal PF 06/12/2018, 07/06/2019  . Influenza,inj,Quad PF,6+ Mos 06/28/2017  . Influenza-Unspecified 07/18/2012, 06/14/2014, 08/12/2015, 06/03/2016, 06/28/2017  . Meningococcal B, OMV 08/23/2017, 09/21/2017  . Meningococcal Conjugate 08/23/2017  . Meningococcal Mcv4o 08/23/2017, 10/18/2017  . Moderna SARS-COVID-2 Vaccination 10/11/2019  . Pneumococcal Conjugate-13 11/28/2013, 11/28/2013  . Pneumococcal Polysaccharide-23 12/26/2005, 09/13/2012  . Pneumococcal-Unspecified 12/26/2005, 09/13/2012  . Tdap 11/28/2013, 11/28/2013  . Zoster 06/06/2012, 06/06/2012    Past Medical History:  Diagnosis Date  . Chronic lower back pain   . Complication of anesthesia 01/2017   was in ICU due to breathing complications-  . COPD (chronic obstructive pulmonary disease) ([place])   . Degenerative scoliosis in adult patient   . Depression   . Dyspnea    on exertion  . GERD (gastroesophageal reflux disease)   . H/O seasonal allergies   . High cholesterol   . 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 History of kidney stones   . Hypertension    . 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 OSA on CPAP    wears CPAP  . Pneumonia    "I've had it several times; last time was end of Jan 2018" (05/02/2017)  . Pre-diabetes   . Prostate cancer ([place])   . Rheumatoid arteritis ([place])    "all over" (05/02/2017)  . Thrush 12/07/2017  . Wears glasses     Tobacco History: Social History   Tobacco Use  Smoking Status Former Smoker  . Packs/day: 1.50  . Years: 54.00  . Pack years: 81.00  . Types: Cigarettes  . Start date: 02/21/1961  . Quit date: 09/27/2014  . Years since quitting: 5.1  Smokeless Tobacco Never Used  Tobacco Comment   05/02/2017 "quit vaping 09/2016"   Counseling given: Yes Comment: 05/02/2017 "quit vaping 09/2016"   Continue to  not smoke  Outpatient Encounter Medications as of 11/09/2019  Medication Sig  . albuterol (PROVENTIL HFA;VENTOLIN HFA) 108 (90 Base) MCG/ACT inhaler Inhale 2 puffs into the lungs every 6 (six) hours as needed for wheezing or shortness of breath.  Marland Kitchen BREO ELLIPTA 100-25 MCG/INH AEPB INHALE 1 PUFF INTO LUNGS DAILY  . cetirizine (ZYRTEC) 10 MG tablet Take 1 tablet by mouth as needed.  . DULoxetine (CYMBALTA) 60 MG capsule Take 60 mg by mouth every evening.   . finasteride (PROSCAR) 5 MG tablet Take 5 mg by mouth at bedtime.   . fluticasone (FLONASE) 50 MCG/ACT nasal spray Place 2 sprays into both nostrils daily as needed for allergies.   . folic acid (FOLVITE) 1 MG tablet Take 1 mg by mouth daily.  . furosemide (LASIX) 20 MG tablet Take 1 tablet (73m total) daily for next 7 days for fluid management  . gabapentin (NEURONTIN) 300 MG capsule Take 800 mg by mouth 3 (three) times daily.   .Marland Kitchenibuprofen (ADVIL) 200 MG tablet Take by mouth daily as needed. 800 - 1,200 as needed  . losartan (COZAAR) 25 MG tablet TAKE ONE (1) TABLET BY MOUTH EACH DAY  . metFORMIN (GLUCOPHAGE) 500 MG tablet Take 1 tablet by mouth daily.  . methotrexate (RHEUMATREX) 2.5 MG tablet Take 2.5 mg  by mouth once a week. Caution:Chemotherapy. Protect from light.  . predniSONE (DELTASONE) 5 MG tablet Take 5 mg by mouth daily with breakfast.  . simvastatin (ZOCOR) 40 MG tablet Take 40 mg by mouth at bedtime.   . sodium chloride (OCEAN) 0.65 % SOLN nasal spray Place 1 spray into both nostrils as needed for congestion.  . tamsulosin (FLOMAX) 0.4 MG CAPS capsule Take 0.4 mg by mouth at bedtime.   . Tiotropium Bromide Monohydrate (SPIRIVA RESPIMAT) 2.5 MCG/ACT AERS Inhale 2 puffs into the lungs daily.  . traZODone (DESYREL) 50 MG tablet Take 50 mg by mouth at bedtime.  . metoprolol succinate (TOPROL-XL) 50 MG 24 hr tablet Take 1 tablet (50 mg total) by mouth 2 (two) times daily. Take with or immediately following a meal.  . [DISCONTINUED] predniSONE (DELTASONE) 10 MG tablet Take 4 tabs po daily x 2 days; then 3 tabs for 2 days; then 2 tabs for 2 days; then 1 tab for 2 days   No facility-administered encounter medications on file as of 11/09/2019.     Review of Systems  Review of Systems  Constitutional: Positive for fatigue. Negative for activity change, chills, fever and unexpected weight change.  HENT: Negative for postnasal drip, rhinorrhea, sinus pressure, sinus pain and sore throat.   Eyes: Negative.   Respiratory: Positive for shortness of breath. Negative for cough and wheezing.   Cardiovascular: Negative for chest pain and palpitations.  Gastrointestinal: Negative for constipation, diarrhea, nausea and vomiting.  Endocrine: Negative.   Genitourinary: Negative.   Musculoskeletal: Negative.   Skin: Negative.   Neurological: Negative for dizziness and headaches.  Psychiatric/Behavioral: Negative.  Negative for dysphoric mood. The patient is not nervous/anxious.   All other systems reviewed and are negative.    Physical Exam  BP 118/82 (BP Location: Left Arm, Patient Position: Sitting, Cuff Size: Normal)   Pulse 79   Temp 97.7 F (36.5 C) (Temporal)   Ht _0  (1.702 m)   Wt  246 lb (111.6 kg)   SpO2 93% Comment: on RA  BMI 38.53 kg/m   Wt Readings from Last 5 Encounters:  11/09/19 246 lb (111.6 kg)  08/27/19 242 lb (  109.8 kg)  07/24/19 222 lb (100.7 kg)  07/11/19 244 lb 12.8 oz (111 kg)  06/26/19 246 lb 12.8 oz (111.9 kg)    BMI Readings from Last 5 Encounters:  11/09/19 38.53 kg/m  08/27/19 37.90 kg/m  07/24/19 34.77 kg/m  07/11/19 38.34 kg/m  06/26/19 38.65 kg/m     Physical Exam Vitals and nursing note reviewed.  Constitutional:      General: He is not in acute distress.    Appearance: Normal appearance. He is obese.  HENT:     Head: Normocephalic and atraumatic.     Right Ear: Hearing, tympanic membrane, ear canal and external ear normal. There is no impacted cerumen.     Left Ear: Hearing, tympanic membrane, ear canal and external ear normal. There is no impacted cerumen.     Nose: Nose normal. No mucosal edema or rhinorrhea.     Right Turbinates: Not enlarged.     Left Turbinates: Not enlarged.     Mouth/Throat:     Mouth: Mucous membranes are dry.     Pharynx: Oropharynx is clear. No oropharyngeal exudate.  Eyes:     Pupils: Pupils are equal, round, and reactive to light.  Cardiovascular:     Rate and Rhythm: Normal rate and regular rhythm.     Pulses: Normal pulses.     Heart sounds: Normal heart sounds. No murmur.  Pulmonary:     Effort: Pulmonary effort is normal.     Breath sounds: Rales (bibasilar crackles ) present. No decreased breath sounds or wheezing.  Musculoskeletal:     Cervical back: Normal range of motion.     Right lower leg: No edema.     Left lower leg: No edema.  Lymphadenopathy:     Cervical: No cervical adenopathy.  Skin:    General: Skin is warm and dry.     Capillary Refill: Capillary refill takes less than 2 seconds.     Findings: No erythema or rash.  Neurological:     General: No focal deficit present.     Mental Status: He is alert and oriented to person, place, and time.     Motor: No  weakness.     Coordination: Coordination normal.     Gait: Gait abnormal.  Psychiatric:        Mood and Affect: Mood normal.        Behavior: Behavior normal. Behavior is cooperative.        Thought Content: Thought content normal.        Judgment: Judgment normal.       Assessment & Plan:   GOLD COPD II B Plan: Continue Breo Ellipta 100 Continue Spiriva Respimat 2.5 Follow up in 4 months    ILD (interstitial lung disease) ([place]) Slightly reduced restriction on pulmonary function test today.  Stable diffusion defect.  Discussion: Patient would still like to hold off on antifibrotic's at this time.   Plan: 2 L of O2 with exertion Continue CPAP therapy with 3 L bled in Continue methotrexate and prednisone under management of rheumatology Continue follow-up with Dr. Vaughan Browner Discussed antifibrotic's, patient declined at this time, he reports he will contact us if he has reconsidered or change his mind 84-monthspirometry with DLCO 484-monthollow-up  OSA on CPAP Plan: Continue CPAP therapy with 3 L of O2 bled in  Rheumatoid arthritis involving multiple sites with positive rheumatoid factor ([place]: Continue follow-up with rheumatology    Return in about 4 months (around 03/08/2020), or if symptoms  worsen or fail to improve, for Follow up with Dr. Vaughan Browner - ILD .   Lauraine Rinne, NP 11/09/2019   This appointment required 34 minutes of patient care (this includes precharting, chart review, review of results, face-to-face care, etc.).

## 2019-11-09 NOTE — Patient Instructions (Addendum)
You were seen today by Lauraine Rinne, NP  for:   1. ILD (interstitial lung disease) ([place])  - Pulmonary function test; Future  We will repeat your breathing test in 6 months  Notify our office if you have additional questions regarding antifibrotic's  Continue oxygen therapy as prescribed  >>>maintain oxygen saturations greater than 88 percent  >>>if unable to maintain oxygen saturations please contact the office  >>>do not smoke with oxygen  >>>can use nasal saline gel or nasal saline rinses to moisturize nose if oxygen causes dryness   2. Chronic bronchitis, unspecified chronic bronchitis type (HCC)  Breo Ellipta 100 >>> Take 1 puff daily in the morning right when you wake up >>>Rinse your mouth out after use >>>This is a daily maintenance inhaler, NOT a rescue inhaler >>>Contact our office if you are having difficulties affording or obtaining this medication >>>It is important for you to be able to take this daily and not miss any doses   Spiriva Respimat 2.5 >>> 2 puffs daily >>> Do this every day >>>This is not a rescue inhaler  3. OSA on CPAP  We recommend that you continue using your CPAP daily >>>Keep up the hard work using your device >>> Goal should be wearing this for the entire night that you are sleeping, at least 4 to 6 hours  Remember:  . Do not drive or operate heavy machinery if tired or drowsy.  . Please notify the supply company and office if you are unable to use your device regularly due to missing supplies or machine being broken.  . Work on maintaining a healthy weight and following your recommended nutrition plan  . Maintain proper daily exercise and movement  . Maintaining proper use of your device can also help improve management of other chronic illnesses such as: Blood pressure, blood sugars, and weight management.   BiPAP/ CPAP Cleaning:  >>>Clean weekly, with Dawn soap, and bottle brush.  Set up to air dry. >>> Wipe mask out daily with wet  wipe or towelette   4. Rheumatoid arthritis involving multiple sites with positive rheumatoid factor ([place])  Continue follow-up with rheumatology   We recommend today:  Orders Placed This Encounter  Procedures  . Pulmonary function test    Standing Status:   Future    Standing Expiration Date:   11/08/2020    Scheduling Instructions:     Arlyce Harman with dlco in august/2021    Order Specific Question:   Where should this test be performed?    Answer:   [facility]    Order Specific Question:   Release to patient    Answer:   Immediate   Orders Placed This Encounter  Procedures  . Pulmonary function test   No orders of the defined types were placed in this encounter.   Follow Up:    Return in about 4 months (around 03/08/2020), or if symptoms worsen or fail to improve, for Follow up with Dr. Vaughan Browner - ILD .   Please do your part to reduce the spread of COVID-19:      Reduce your risk of any infection  and COVID19 by using the similar precautions used for avoiding the common cold or flu:  Marland Kitchen Wash your hands often with soap and warm water for at least 20 seconds.  If soap and water are not readily available, use an alcohol-based hand sanitizer with at least 60% alcohol.  . If coughing or sneezing, cover your mouth and nose by coughing  or sneezing into the elbow areas of your shirt or coat, into a tissue or into your sleeve (not your hands). Langley Gauss A MASK when in public  . Avoid shaking hands with others and consider head nods or verbal greetings only. . Avoid touching your eyes, nose, or mouth with unwashed hands.  . Avoid close contact with people who are sick. . Avoid places or events with large numbers of people in one location, like concerts or sporting events. . If you have some symptoms but not all symptoms, continue to monitor at home and seek medical attention if your symptoms worsen. . If you are having a medical emergency, call 911.   [place] / e-Visit: [email or web address]         [facility]: [facility]: [phone]                   [facility]: [phone]     It is flu season:   >>> Best ways to protect herself from the flu: Receive the yearly flu vaccine, practice good hand hygiene washing with soap and also using hand sanitizer when available, eat a nutritious meals, get adequate rest, hydrate appropriately   Please contact the office if your symptoms worsen or you have concerns that you are not improving.   Thank you for choosing [facility] for your healthcare, and for allowing [place] to partner with you on your healthcare journey. I am thankful to be able to provide care to you today.   [name] FNP-C

## 2019-11-09 NOTE — Assessment & Plan Note (Signed)
Plan: Continue follow-up with rheumatology

## 2019-11-09 NOTE — Progress Notes (Signed)
PFT done today. 

## 2019-11-09 NOTE — Assessment & Plan Note (Signed)
Plan: Continue Breo Ellipta 100 Continue Spiriva Respimat 2.5 Follow up in 4 months

## 2019-11-09 NOTE — Assessment & Plan Note (Signed)
Plan: Continue CPAP therapy with 3 L of O2 bled in

## 2019-11-09 NOTE — Assessment & Plan Note (Signed)
Slightly reduced restriction on pulmonary function test today.  Stable diffusion defect.  Discussion: Patient would still like to hold off on antifibrotic's at this time.   Plan: 2 L of O2 with exertion Continue CPAP therapy with 3 L bled in Continue methotrexate and prednisone under management of rheumatology Continue follow-up with Dr. Vaughan Browner Discussed antifibrotic's, patient declined at this time, he reports he will contact us if he has reconsidered or change his mind 61-monthspirometry with DLCO 456-monthollow-up

## 2019-11-20 NOTE — Progress Notes (Signed)
Cardiology Office Note Date:  11/21/2019  Patient ID:  [name], [name] 1941-07-13, MRN [id number] PCP:  Javier Glazier, MD  Cardiologist:  Dr. Stanford Breed (hospital consult 2019) Electrophysiologist: Dr. Rayann Heman  Pulmonologist: Dr. Marshell Garfinkel Rheumatology: Dr. Trudie Reed     Chief Complaint:  planned f/u visit  History of Present Illness: [name] is a 79 y.o. male with history of RA, prostate cancer, HTN, HLD, GERD, COPD, ILD (quit smoking 2015), now on home O2, ILD, RA (chronically on methotrexate), OSA w/CPAP.  The patient was initially seen by EP, Dr. Rayann Heman post-op after a back surgery  oct 2019 when he developed an atrial tachycardia.  He was started on Flecainide by cardiology though at that time with no sustained events and minimal symptoms, not felt to warrant AAD tx and stopped with recommendations to continue his BB.  At his f/u there was c/o orthostatic dizziness, low BP readings at rehab, was cleared to reduce his metoprolol if no recurrent palpitations after 6 weeks time, his amlodipine was stopped, losartan resumed.  Urged to be compliant with his CPAP.  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  CT shows stable fibrosis stable though there has been a deterioration in clinical status and PFTs Discussed treatment with patient and suggested anti-fibrotic therapy with Ofev He is not sure if he wants therapy especially as there is no cure and only slowing down of progression.  We have decided to recheck spirometry diffusion capacity in 4 months and reevaluate." Also noted to have enlarged PA and oreded for an echo to evaluate for p.HTN  Echo noted mod elevated PA pressure >> will defer to pulmonology, LVEF 60-65%  11/09/19 with pulm, pt again wanted to hold off on antifibrotic therapy , recommended to use O2 with exertion, continue nighttime CPAP   I saw him Oct 2020, he reported feeling OK.  He was aware of his HR being up  when ambulating, rarely at rest.  He recalled about 2 mo ago at night feeling his heart start to beat fast, took a few deep breaths and held it and this settled it down.  He reported this kind of scenario happens about once every couple months and would not be inclined to adjust any medicines or therapies.  He was not feel bothered by his tachycardia His primary limitation was his lung disease, RA and back pain.  He denied any CP, no near syncope or syncope He monitors his BP and HR at home, he will get higher BP readings when he first sits down, though comes down well with rest and typically about like today's, he did not recall any particular HR reading.  Just got a new pulse Ox and will be able to keep a better eye on this.  EKG at this visit was SR/possible MAT/PACs, prior EKG reviewed looked liked an AT and his Toprol was increased, his BP felt to have room. Panned for 3 mo follow up.  He saw ID, 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  All in all he thinks he is doing OK.  Unfortunately with an ongoing RA flare and will be going back on Remicade, with a recent bump in his prednisone.   He requires O2 now day (previously only at night with his CPAP) he does not want to start anitfibrotic medications.  He  had tried to keep up with some pool exercises though with O2 is too difficult.  The few times he has done exertional activities without O2 his Sats drop to 79-low 80s and his HR goes "way up" and he feels weak, lightheaded.  When he sits and puts O2 on both improve.  No CP, he will occasionally be aware of some palpitations at night atrest, no more then he has had in the past No syncope.    Past Medical History:  Diagnosis Date  . Chronic lower back pain   . Complication of anesthesia 01/2017   was in ICU due to breathing complications-  . COPD  (chronic obstructive pulmonary disease) ([place])   . Degenerative scoliosis in adult patient   . Depression   . Dyspnea    on exertion  . GERD (gastroesophageal reflux disease)   . H/O seasonal allergies   . High cholesterol   . 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 History of kidney stones   . Hypertension   . 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 OSA on CPAP    wears CPAP  . Pneumonia    "I've had it several times; last time was end of Jan 2018" (05/02/2017)  . Pre-diabetes   . Prostate cancer ([place])   . Rheumatoid arteritis ([place])    "all over" (05/02/2017)  . Thrush 12/07/2017  . Wears glasses     Past Surgical History:  Procedure Laterality Date  . BACK SURGERY    . COLONOSCOPY W/ POLYPECTOMY    . FRACTURE SURGERY    . HERNIA REPAIR    . HIP ARTHROPLASTY Left 04/2016  . INGUINAL HERNIA REPAIR Left 1986  . JOINT REPLACEMENT    . LUMBAR WOUND DEBRIDEMENT N/A 04/18/2017   Procedure: REPAIR OF LUMBAR PSEUDOMENINGOCELE;  Surgeon: Newman Pies, MD;  Location: [place];  Service: Neurosurgery;  Laterality: N/A;  REAIR OF LUMBAR PSEUDOMENINGOCELE  . LUMBAR WOUND DEBRIDEMENT N/A 05/05/2017   Procedure: I&D Lumbar Wound;  Surgeon: Newman Pies, MD;  Location: [facility];  Service: Neurosurgery;  Laterality: N/A;  . MAXIMUM ACCESS (MAS)POSTERIOR LUMBAR INTERBODY FUSION (PLIF) 3 LEVEL  02/17/2017   [name] 02/17/2017  . OPEN SURGICAL REPAIR OF GLUTEAL TENDON Right 11/02/2017   Procedure: Right hip gluteal tendon repair;  Surgeon: Gaynelle Arabian, MD;  Location: [facility];  Service: Orthopedics;  Laterality: Right;  . POSTERIOR LAMINECTOMY / DECOMPRESSION LUMBAR SPINE  2010  . PROSTATE BIOPSY    . PROSTATE BIOPSY  <2013 X 3  . SPLENECTOMY  1955  . TONSILLECTOMY    . TOTAL HIP ARTHROPLASTY Right 10/2015  . UMBILICAL HERNIA REPAIR  05/2016    Current Outpatient Medications  Medication Sig Dispense Refill  . albuterol (PROVENTIL  HFA;VENTOLIN HFA) 108 (90 Base) MCG/ACT inhaler Inhale 2 puffs into the lungs every 6 (six) hours as needed for wheezing or shortness of breath.    Marland Kitchen BREO ELLIPTA 100-25 MCG/INH AEPB INHALE 1 PUFF INTO LUNGS DAILY 180 each 1  . cetirizine (ZYRTEC) 10 MG tablet Take 1 tablet by mouth as needed.    . DULoxetine (CYMBALTA) 60 MG capsule Take 60 mg by mouth every evening.     . finasteride (PROSCAR) 5 MG tablet Take 5 mg by mouth at bedtime.     . fluticasone (FLONASE) 50 MCG/ACT nasal spray Place 2 sprays into both nostrils daily as needed for allergies.     . folic acid (FOLVITE)  1 MG tablet Take 1 mg by mouth daily.    . furosemide (LASIX) 20 MG tablet Take 1 tablet (37m total) daily for next 7 days for fluid management 7 tablet 0  . gabapentin (NEURONTIN) 300 MG capsule Take 800 mg by mouth 3 (three) times daily.     .Marland Kitchenibuprofen (ADVIL) 200 MG tablet Take by mouth daily as needed. 800 - 1,200 as needed    . losartan (COZAAR) 25 MG tablet TAKE ONE (1) TABLET BY MOUTH EACH DAY 90 tablet 3  . metFORMIN (GLUCOPHAGE) 500 MG tablet Take 1 tablet by mouth daily.    . methotrexate (RHEUMATREX) 2.5 MG tablet Take 2.5 mg by mouth once a week. Caution:Chemotherapy. Protect from light.    . metoprolol succinate (TOPROL-XL) 25 MG 24 hr tablet Take 25 mg by mouth 2 (two) times daily.    . predniSONE (DELTASONE) 5 MG tablet Take 5 mg by mouth daily with breakfast.    . simvastatin (ZOCOR) 40 MG tablet Take 40 mg by mouth at bedtime.     . sodium chloride (OCEAN) 0.65 % SOLN nasal spray Place 1 spray into both nostrils as needed for congestion.    . tamsulosin (FLOMAX) 0.4 MG CAPS capsule Take 0.4 mg by mouth at bedtime.     . Tiotropium Bromide Monohydrate (SPIRIVA RESPIMAT) 2.5 MCG/ACT AERS Inhale 2 puffs into the lungs daily. 3 Inhaler 3  . traZODone (DESYREL) 50 MG tablet Take 50 mg by mouth at bedtime.     No current facility-administered medications for this visit.    Allergies:   Sulfa antibiotics,  Sulfamethoxazole-trimethoprim, Sulfasalazine, Other, Varenicline, and Lisinopril   Social History:  The patient  reports that he quit smoking about 5 years ago. His smoking use included cigarettes. He started smoking about 58 years ago. He has a 81.00 pack-year smoking history. He has never used smokeless tobacco. He reports current alcohol use of about 17.0 standard drinks of alcohol per week. He reports that he does not use drugs.   Family History:  The patient's family history includes Cancer in his mother; Heart disease in his father; Stroke in his father.  ROS:  Please see the history of present illness.  All other systems are reviewed and otherwise negative.   PHYSICAL EXAM:  VS:  BP 126/78   Pulse 75   Ht _0  (1.702 m)   Wt 244 lb (110.7 kg)   SpO2 96%   BMI 38.22 kg/m  BMI: Body mass index is 38.22 kg/m. Well nourished, well developed, in no acute distress  HEENT: normocephalic, atraumatic  Neck: no JVD, carotid bruits or masses Cardiac: irregular,; no significant murmurs, no rubs, or gallops Lungs: CTA b/l, no wheezing, rhonchi or rales are appreciated today  Abd: soft, nontender, obese MS: no deformity or atrophy Ext: trace-1+ edema, wearing support stockings Skin: warm and dry, no rash Neuro:  No gross deficits appreciated Psych: euthymic mood, full affect     EKG:  #1 SR, 91bpm, PACs, with a PAT at the end PAT lasted only a few seconds > SR 78bp, PACs  07/13/2019: TTE 1. Left ventricular ejection fraction, by visual estimation, is 60 to 65%. Overall LVF appears preserved but cannot comment on regional wall motion. There is no left ventricular hypertrophy.  2. Left ventricular diastolic function could not be evaluated pattern of LV diastolic filling.  3. Global right ventricle has normal systolic function.The right ventricular size is normal. No increase in right ventricular wall thickness.  4. Left atrial size was normal.  5. Right atrial size was normal.  6.  The mitral valve is normal in structure. Trace mitral valve regurgitation. No evidence of mitral stenosis.  7. The tricuspid valve is normal in structure. Tricuspid valve regurgitation is trivial.  8. The aortic valve is tricuspid. There is Mild thickening of the aortic valve. There is Moderate calcification of the aortic valve. Aortic valve regurgitation was not visualized by color flow Doppler. Mild to moderate aortic valve  sclerosis/calcification without any evidence of aortic stenosis.  9. The pulmonic valve was normal in structure. Pulmonic valve regurgitation is trivial by color flow Doppler. 10. Moderately elevated pulmonary artery systolic pressure. 11. The inferior vena cava is normal in size with greater than 50% respiratory variability, suggesting right atrial pressure of 3 mmHg. 12. Recommend limited study with definity contrast for accurate assessment of LVF.    FINDINGS  Left Ventricle: Left ventricular ejection fraction, by visual estimation, is 60 to 65%. The left ventricle has normal function. There is no left ventricular hypertrophy. Normal left ventricular size. The left ventricular diastology could not be  evaluatedsecondary to atrial fibrillation. Spectral Doppler shows Left ventricular diastolic function could not be evaluated pattern of LV diastolic filling.  Right Ventricle: The right ventricular size is normal. No increase in right ventricular wall thickness. Global RV systolic function is has normal systolic function. The tricuspid regurgitant velocity is 2.74 m/s, and with an assumed right atrial pressure  of 10 mmHg, the estimated right ventricular systolic pressure is moderately elevated at 40.1 mmHg.  Left Atrium: Left atrial size was normal in size.  Right Atrium: Right atrial size was normal in size  Pericardium: There is no evidence of pericardial effusion.  Mitral Valve: The mitral valve is normal in structure. No evidence of mitral valve stenosis by  observation. Trace mitral valve regurgitation.  Tricuspid Valve: The tricuspid valve is normal in structure. Tricuspid valve regurgitation is trivial by color flow Doppler.  Aortic Valve: The aortic valve is tricuspid. There is Mild thickening and Moderate calcification of the aortic valve. Aortic valve regurgitation was not visualized by color flow Doppler. Mild to moderate aortic valve sclerosis/calcification is present,  without any evidence of aortic stenosis. There is Mild thickening of the aortic valve. Moderate calcification.  Pulmonic Valve: The pulmonic valve was normal in structure. Pulmonic valve regurgitation is trivial by color flow Doppler.  Aorta: The aortic root, ascending aorta and aortic arch are all structurally normal, with no evidence of dilitation or obstruction.  Venous: The inferior vena cava is normal in size with greater than 50% respiratory variability, suggesting right atrial pressure of 3 mmHg.  IAS/Shunts: No atrial level shunt detected by color flow Doppler. No ventricular septal defect is seen or detected. There is no evidence of an atrial septal defect.    Recent Labs: 10/30/2019: ALT 15; BUN 12; Creatinine, Ser 1.05; Hemoglobin 13.9; Platelets 295.0; Potassium 4.0; Pro B Natriuretic peptide (BNP) 146.0; Sodium 139  No results found for requested labs within last 8760 hours.   CrCl cannot be calculated (Patient's most recent lab result is older than the maximum 21 days allowed.).   Wt Readings from Last 3 Encounters:  11/21/19 244 lb (110.7 kg)  11/09/19 246 lb (111.6 kg)  08/27/19 242 lb (109.8 kg)     Other studies reviewed: Additional studies/records reviewed today include: summarized above  ASSESSMENT AND PLAN:  1. A Tachycardia     Largely unaware of this  Seems to have had progression of his ILD, now on home O2, so far, no clear uptick in his palpitations     HR/rhythm today better then in the past     No changes  2. HTN      Looks OK, no changes  3. HLD     Being monitored and managed with his PMD     Disposition: no changes, he follows closely with his pulmonology and Rheumatology teams.  We will see him back in 2mo sooner if needed.     Current medicines are reviewed at length with the patient today.  The patient did not have any concerns regarding medicines.  SVenetia Night PA-C 11/21/2019 4:52 PM     [place] [phone](office)  [phone](fax)

## 2019-11-21 ENCOUNTER — Ambulatory Visit: Payer: Medicare PPO | Admitting: Physician Assistant

## 2019-11-21 ENCOUNTER — Other Ambulatory Visit: Payer: Self-pay

## 2019-11-21 VITALS — BP 126/78 | HR 75 | Ht 67.0 in | Wt 244.0 lb

## 2019-11-21 DIAGNOSIS — I10 Essential (primary) hypertension: Secondary | ICD-10-CM

## 2019-11-21 DIAGNOSIS — I471 Supraventricular tachycardia: Secondary | ICD-10-CM

## 2019-11-21 NOTE — Patient Instructions (Signed)
Medication Instructions:  Your physician recommends that you continue on your current medications as directed. Please refer to the Current Medication list given to you today.  *If you need a refill on your cardiac medications before your next appointment, please call your pharmacy*  Lab Work: [place]   If you have labs (blood work) drawn today and your tests are completely normal, you will receive your results only by: Marland Kitchen MyChart Message (if you have MyChart) OR . A paper copy in the mail If you have any lab test that is abnormal or we need to change your treatment, we will call you to review the results.  Testing/Procedures: NONE ORDERED  TODAY    Follow-Up: At [facility], you and your health needs are our priority.  As part of our continuing mission to provide you with exceptional heart care, we have created designated Provider Care Teams.  These Care Teams include your primary Cardiologist (physician) and Advanced Practice Providers (APPs -  Physician Assistants and Nurse Practitioners) who all work together to provide you with the care you need, when you need it.  Your next appointment:   6 month(s)  The format for your next appointment:   In Person  Provider:   You may see Dr. Rayann Heman   Other Instructions

## 2019-11-28 NOTE — Addendum Note (Signed)
Addended by: Claude Manges on: 11/28/2019 08:49 AM   Modules accepted: Orders

## 2019-12-21 ENCOUNTER — Encounter: Payer: Self-pay | Admitting: Family

## 2019-12-21 ENCOUNTER — Ambulatory Visit: Payer: Medicare PPO | Admitting: Family

## 2019-12-21 ENCOUNTER — Other Ambulatory Visit: Payer: Self-pay

## 2019-12-21 VITALS — BP 140/91 | HR 120 | Temp 98.2°F | Wt 244.0 lb

## 2019-12-21 DIAGNOSIS — T847XXD Infection and inflammatory reaction due to other internal orthopedic prosthetic devices, implants and grafts, subsequent encounter: Secondary | ICD-10-CM

## 2019-12-21 NOTE — Patient Instructions (Signed)
Nice to meet you.  We will check your blood work today.  If your numbers are elevated we will consider additional imaging.  Have a great day and stay safe!

## 2019-12-21 NOTE — Progress Notes (Signed)
Subjective:    [name], male    DOB: 1941/06/14, 79 y.o.   MRN: [id number]  Chief Complaint  Patient presents with  . Follow-up    back pain returned, concerned if infection is back     HPI:  [name] is a 79 y.o. male with previous medical history of hardware associated vertebral osteomyelitis with cultures positive for Pseudomonas with treatment through high dose ciprofloxacin. [name] was last seen in our office on 11/30 having done well off antibiotics for several months and recommended follow up as needed  With CRP of 2.8 and ESR of 14.  [name] contacted the office triage nurse line with concern for worsening back pain and return of infection. Over the past 6 week he has been experiencing increasing levels of back pain located in the lower thoracic and upper lumbar region on the right side of his back adjacent to his previous surgical incision. Pain is constant and worsened with walking and standing. Severity of pain ranges from 1-4/10 depending upon activities that he is performing. No recent injury or trauma. No systemic symptoms of fever, chills or sweats. No radiculopathy, saddle anesthesia or changes to bowel or bladder function.    Allergies  Allergen Reactions  . Sulfa Antibiotics Shortness Of Breath  . Sulfamethoxazole-Trimethoprim Shortness Of Breath  . Sulfasalazine Shortness Of Breath  . Other Other (See Comments)  . Varenicline Other (See Comments)    Strange thoughts Strange thoughts SUICIDAL  . Lisinopril Cough      Outpatient Medications Prior to Visit  Medication Sig Dispense Refill  . albuterol (PROVENTIL HFA;VENTOLIN HFA) 108 (90 Base) MCG/ACT inhaler Inhale 2 puffs into the lungs every 6 (six) hours as needed for wheezing or shortness of breath.    Marland Kitchen BREO ELLIPTA 100-25 MCG/INH AEPB INHALE 1 PUFF INTO LUNGS DAILY 180 each 1  . cetirizine (ZYRTEC) 10 MG tablet Take 1 tablet by mouth as needed.    . DULoxetine  (CYMBALTA) 60 MG capsule Take 60 mg by mouth every evening.     . finasteride (PROSCAR) 5 MG tablet Take 5 mg by mouth at bedtime.     . fluticasone (FLONASE) 50 MCG/ACT nasal spray Place 2 sprays into both nostrils daily as needed for allergies.     . folic acid (FOLVITE) 1 MG tablet Take 1 mg by mouth daily.    . furosemide (LASIX) 20 MG tablet Take 1 tablet (58m total) daily for next 7 days for fluid management 7 tablet 0  . gabapentin (NEURONTIN) 300 MG capsule Take 800 mg by mouth 3 (three) times daily.     .Marland Kitchenibuprofen (ADVIL) 200 MG tablet Take by mouth daily as needed. 800 - 1,200 as needed    . losartan (COZAAR) 25 MG tablet TAKE ONE (1) TABLET BY MOUTH EACH DAY 90 tablet 3  . metFORMIN (GLUCOPHAGE) 500 MG tablet Take 1 tablet by mouth daily.    . methotrexate (RHEUMATREX) 2.5 MG tablet Take 2.5 mg by mouth once a week. Caution:Chemotherapy. Protect from light.    . metoprolol succinate (TOPROL-XL) 25 MG 24 hr tablet Take 50 mg by mouth 2 (two) times daily.     . predniSONE (DELTASONE) 5 MG tablet Take 5 mg by mouth daily with breakfast.    . simvastatin (ZOCOR) 40 MG tablet Take 40 mg by mouth at bedtime.     . tamsulosin (FLOMAX) 0.4 MG CAPS capsule Take 0.4 mg by mouth at bedtime.     .Marland Kitchen  Tiotropium Bromide Monohydrate (SPIRIVA RESPIMAT) 2.5 MCG/ACT AERS Inhale 2 puffs into the lungs daily. 3 Inhaler 3  . traZODone (DESYREL) 50 MG tablet Take 50 mg by mouth at bedtime.    . sodium chloride (OCEAN) 0.65 % SOLN nasal spray Place 1 spray into both nostrils as needed for congestion.     No facility-administered medications prior to visit.     Past Medical History:  Diagnosis Date  . Chronic lower back pain   . Complication of anesthesia 01/2017   was in ICU due to breathing complications-  . COPD (chronic obstructive pulmonary disease) ([place])   . Degenerative scoliosis in adult patient   . Depression   . Dyspnea    on exertion  . GERD (gastroesophageal reflux disease)   . H/O  seasonal allergies   . High cholesterol   . 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 History of kidney stones   . Hypertension   . 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 OSA on CPAP    wears CPAP  . Pneumonia    "I've had it several times; last time was end of Jan 2018" (05/02/2017)  . Pre-diabetes   . Prostate cancer ([place])   . Rheumatoid arteritis ([place])    "all over" (05/02/2017)  . Thrush 12/07/2017  . Wears glasses      Past Surgical History:  Procedure Laterality Date  . BACK SURGERY    . COLONOSCOPY W/ POLYPECTOMY    . FRACTURE SURGERY    . HERNIA REPAIR    . HIP ARTHROPLASTY Left 04/2016  . INGUINAL HERNIA REPAIR Left 1986  . JOINT REPLACEMENT    . LUMBAR WOUND DEBRIDEMENT N/A 04/18/2017   Procedure: REPAIR OF LUMBAR PSEUDOMENINGOCELE;  Surgeon: Newman Pies, MD;  Location: [facility];  Service: Neurosurgery;  Laterality: N/A;  REAIR OF LUMBAR PSEUDOMENINGOCELE  . LUMBAR WOUND DEBRIDEMENT N/A 05/05/2017   Procedure: I&D Lumbar Wound;  Surgeon: Newman Pies, MD;  Location: [facility];  Service: Neurosurgery;  Laterality: N/A;  . MAXIMUM ACCESS (MAS)POSTERIOR LUMBAR INTERBODY FUSION (PLIF) 3 LEVEL  02/17/2017   [name] 02/17/2017  . OPEN SURGICAL REPAIR OF GLUTEAL TENDON Right 11/02/2017   Procedure: Right hip gluteal tendon repair;  Surgeon: Gaynelle Arabian, MD;  Location: [facility];  Service: Orthopedics;  Laterality: Right;  . POSTERIOR LAMINECTOMY / DECOMPRESSION LUMBAR SPINE  2010  . PROSTATE BIOPSY    . PROSTATE BIOPSY  <2013 X 3  . SPLENECTOMY  1955  . TONSILLECTOMY    . TOTAL HIP ARTHROPLASTY Right 10/2015  . UMBILICAL HERNIA REPAIR  05/2016       Review of Systems  Constitutional: Negative for chills, diaphoresis, fatigue and fever.  Respiratory: Negative for cough, chest tightness, shortness of breath and wheezing.   Cardiovascular: Negative for chest pain.  Gastrointestinal: Negative for abdominal pain,  diarrhea, nausea and vomiting.  Musculoskeletal: Positive for back pain.      Objective:    BP (!) 140/91   Pulse (!) 120   Temp 98.2 F (36.8 C) (Oral)   Wt 244 lb (110.7 kg)   SpO2 96% Comment: 4L [place]  BMI 38.22 kg/m  Nursing note and vital signs reviewed.  Physical Exam Constitutional:      General: He is not in acute distress.    Appearance: He is well-developed.     Interventions: Nasal cannula in place.     Comments: Seated in the chair; pleasant.  Cardiovascular:     Rate and Rhythm: Normal rate and regular rhythm.     Heart sounds: Normal heart sounds.  Pulmonary:     Effort: Pulmonary effort is normal.     Breath sounds: Normal breath sounds.  Skin:    General: Skin is warm and dry.  Neurological:     Mental Status: He is alert and oriented to person, place, and time.  Psychiatric:        Behavior: Behavior normal.        Thought Content: Thought content normal.        Judgment: Judgment normal.      Depression screen [facility] 2/9 12/21/2019 08/27/2019 03/05/2019 03/20/2018 12/07/2017  Decreased Interest 0 0 0 - 0  Down, Depressed, Hopeless 1 0 0 0 0  PHQ - 2 Score 1 0 0 0 0       Assessment & Plan:    Patient Active Problem List   Diagnosis Date Noted  . Abdominal tenderness, RUQ (right upper quadrant) 02/22/2019  . Lumbar adjacent segment disease with spondylolisthesis 07/10/2018  . ILD (interstitial lung disease) ([place]) 07/04/2018  . Dyspnea 06/13/2018  . Cough due to ACE inhibitor 06/13/2018  . Former tobacco use 06/13/2018  . Acute sinusitis 05/30/2018  . Thrush 12/07/2017  . Tendinopathy of right gluteus medius 11/02/2017  . 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 Hardware complicating wound infection ([place])   . Vertebral osteomyelitis ([place])   . Pseudomonas aeruginosa infection   . Rheumatoid arthritis involving multiple sites with positive rheumatoid factor ([place])   . Postprocedural pseudomeningocele  04/18/2017  . Hypoxia   . Lumbar degenerative disc disease 02/17/2017  . Cigarette smoker 11/17/2016  . 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 Allergic rhinitis 03/23/2008  . HYPERLIPIDEMIA 03/15/2008  . 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 PNEUMONIA 03/14/2008  . GOLD COPD II B 03/14/2008  . Rheumatoid arthritis ([place]) 03/14/2008  . OSA on CPAP 03/14/2008     Problem List Items Addressed This Visit      Other   Hardware complicating wound infection ([place]) - Primary    [name] is a 79 y/o male with previous treatment for hardware complicating wound infection with Pseudomonas who has been off antibiotics for about 1 year now and having increasing back pain without radiculopathy, neurological symptoms, or systemic symptoms of infection. We will check his inflammatory markers today and based on results will consider imaging of his back if they are elevated. He is stable and has no immediate need for antibiotics. Continue with non-pharmacological pain management of pain. Plan for follow up as needed pending his results.       Relevant Orders   C-reactive protein (Completed)   Sedimentation rate (Completed)   CBC w/Diff (Completed)       I am having [name] maintain his DULoxetine, folic acid, gabapentin, simvastatin, tamsulosin, traZODone, finasteride, fluticasone, albuterol, sodium chloride, predniSONE, methotrexate, Tiotropium Bromide Monohydrate, furosemide, metFORMIN, cetirizine, ibuprofen, Breo Ellipta, losartan, and metoprolol succinate.    Follow-up: Pending blood work results if needed.   Terri Piedra, MSN, FNP-C Nurse Practitioner [facility] [place] number: [phone]

## 2019-12-22 ENCOUNTER — Encounter: Payer: Self-pay | Admitting: Family

## 2019-12-22 LAB — CBC WITH DIFFERENTIAL/PLATELET
Absolute Monocytes: 1385 cells/uL — ABNORMAL HIGH (ref 200–950)
Basophils Absolute: 60 cells/uL (ref 0–200)
Basophils Relative: 0.7 %
Eosinophils Absolute: 155 cells/uL (ref 15–500)
Eosinophils Relative: 1.8 %
HCT: 42.7 % (ref 38.5–50.0)
Hemoglobin: 14.4 g/dL (ref 13.2–17.1)
Lymphs Abs: 1789 cells/uL (ref 850–3900)
MCH: 35 pg — ABNORMAL HIGH (ref 27.0–33.0)
MCHC: 33.7 g/dL (ref 32.0–36.0)
MCV: 103.6 fL — ABNORMAL HIGH (ref 80.0–100.0)
MPV: 9.3 fL (ref 7.5–12.5)
Monocytes Relative: 16.1 %
Neutro Abs: 5212 cells/uL (ref 1500–7800)
Neutrophils Relative %: 60.6 %
Platelets: 252 10*3/uL (ref 140–400)
RBC: 4.12 10*6/uL — ABNORMAL LOW (ref 4.20–5.80)
RDW: 12.6 % (ref 11.0–15.0)
Total Lymphocyte: 20.8 %
WBC: 8.6 10*3/uL (ref 3.8–10.8)

## 2019-12-22 LAB — SEDIMENTATION RATE: Sed Rate: 9 mm/h (ref 0–20)

## 2019-12-22 LAB — C-REACTIVE PROTEIN: CRP: 1.2 mg/L (ref <8.0)

## 2019-12-22 NOTE — Assessment & Plan Note (Signed)
[name] is a 79 y/o male with previous treatment for hardware complicating wound infection with Pseudomonas who has been off antibiotics for about 1 year now and having increasing back pain without radiculopathy, neurological symptoms, or systemic symptoms of infection. We will check his inflammatory markers today and based on results will consider imaging of his back if they are elevated. He is stable and has no immediate need for antibiotics. Continue with non-pharmacological pain management of pain. Plan for follow up as needed pending his results.

## 2019-12-24 ENCOUNTER — Telehealth: Payer: Self-pay | Admitting: Family

## 2019-12-24 NOTE — Telephone Encounter (Signed)
Spoke with [name] regarding his testing results. Inflammatory markers are within normal ranges with normal WBC count making risk of infection low. Encouraged to continue to monitor and follow up with neurosurgery if pain continues and/or worsens.

## 2019-12-31 ENCOUNTER — Other Ambulatory Visit: Payer: Self-pay | Admitting: Pulmonary Disease

## 2020-02-01 ENCOUNTER — Other Ambulatory Visit: Payer: Self-pay | Admitting: Pulmonary Disease

## 2020-03-10 ENCOUNTER — Ambulatory Visit: Payer: Medicare PPO | Admitting: Pulmonary Disease

## 2020-03-21 ENCOUNTER — Encounter: Payer: Self-pay | Admitting: Pulmonary Disease

## 2020-03-21 ENCOUNTER — Other Ambulatory Visit: Payer: Self-pay

## 2020-03-21 ENCOUNTER — Ambulatory Visit: Payer: Medicare PPO | Admitting: Pulmonary Disease

## 2020-03-21 VITALS — BP 124/86 | HR 92 | Temp 98.4°F | Ht 67.0 in | Wt 243.0 lb

## 2020-03-21 DIAGNOSIS — R0609 Other forms of dyspnea: Secondary | ICD-10-CM

## 2020-03-21 DIAGNOSIS — M0579 Rheumatoid arthritis with rheumatoid factor of multiple sites without organ or systems involvement: Secondary | ICD-10-CM | POA: Diagnosis not present

## 2020-03-21 DIAGNOSIS — Z79899 Other long term (current) drug therapy: Secondary | ICD-10-CM

## 2020-03-21 DIAGNOSIS — R0600 Dyspnea, unspecified: Secondary | ICD-10-CM | POA: Diagnosis not present

## 2020-03-21 DIAGNOSIS — J849 Interstitial pulmonary disease, unspecified: Secondary | ICD-10-CM

## 2020-03-21 DIAGNOSIS — J42 Unspecified chronic bronchitis: Secondary | ICD-10-CM

## 2020-03-21 DIAGNOSIS — G4733 Obstructive sleep apnea (adult) (pediatric): Secondary | ICD-10-CM

## 2020-03-21 DIAGNOSIS — Z9989 Dependence on other enabling machines and devices: Secondary | ICD-10-CM

## 2020-03-21 NOTE — Assessment & Plan Note (Signed)
Plan: Continue CPAP therapy with 3 L of O2 bled in

## 2020-03-21 NOTE — Assessment & Plan Note (Signed)
Plan: Continue 2 L of O2 with physical exertion Continue CPAP therapy with 3 L bled in Continue methotrexate and prednisone under the management of rheumatology Patient is interested in starting antifibrotic's at this time, Ofev paperwork filled out today We will coordinate follow-up with the clinical pharmacy team We will see if we can get the patient moved up and schedule for spirometry with DLCO

## 2020-03-21 NOTE — Assessment & Plan Note (Signed)
Plan: Continue medications as managed by rheumatology Keep follow-up with rheumatology

## 2020-03-21 NOTE — Assessment & Plan Note (Signed)
Patient is interested in starting Ofev  Plan: We will set up clinical pharmacy visit to review Ofev start

## 2020-03-21 NOTE — Patient Instructions (Addendum)
You were seen today by Lauraine Rinne, NP  for:   1. ILD (interstitial lung disease) ([place]) 2. Dyspnea on exertion  We will see if we can move up your spirometry with DLCO to further evaluate your increased shortness of breath  We will bring you back in to see our clinical pharmacy team for a discussion regarding starting Ofev  3. Rheumatoid arthritis involving multiple sites with positive rheumatoid factor (HCC)  Continue follow-up with Dr. Trudie Reed  Continue methotrexate  4. Medication management  Please present to our office in 2-4 weeks for an appointment with the clinical pharmacy team for:  Marland Kitchen Medication Management - OFEV start  . Medication reconciliation  . Medication Access  . Inhaler teaching   5. Chronic bronchitis, unspecified chronic bronchitis type (HCC)  Breo Ellipta 100 >>> Take 1 puff daily in the morning right when you wake up >>>Rinse your mouth out after use >>>This is a daily maintenance inhaler, NOT a rescue inhaler >>>Contact our office if you are having difficulties affording or obtaining this medication >>>It is important for you to be able to take this daily and not miss any doses   Spiriva Respimat 2.5 >>> 2 puffs daily >>> Do this every day >>>This is not a rescue inhaler  6. OSA on CPAP  We recommend that you continue using your CPAP daily >>>Keep up the hard work using your device >>> Goal should be wearing this for the entire night that you are sleeping, at least 4 to 6 hours  Remember:  . Do not drive or operate heavy machinery if tired or drowsy.  . Please notify the supply company and office if you are unable to use your device regularly due to missing supplies or machine being broken.  . Work on maintaining a healthy weight and following your recommended nutrition plan  . Maintain proper daily exercise and movement  . Maintaining proper use of your device can also help improve management of other chronic illnesses such as: Blood pressure,  blood sugars, and weight management.   BiPAP/ CPAP Cleaning:  >>>Clean weekly, with Dawn soap, and bottle brush.  Set up to air dry. >>> Wipe mask out daily with wet wipe or towelette  Follow Up:    Return in about 6 weeks (around 05/02/2020), or if symptoms worsen or fail to improve, for Follow up for PFT - 17mn, Spiro with DLCO, Follow up with Dr. MVaughan Browner   Next available spirometry with DLCO (30-minute pulmonary function test) appointment  Schedule appointment with Dr. MVaughan Browner okay to place recall if Aga schedule not available yet       Please do your part to reduce the spread of COVID-19:      Reduce your risk of any infection  and COVID19 by using the similar precautions used for avoiding the common cold or flu:  .Marland KitchenWash your hands often with soap and warm water for at least 20 seconds.  If soap and water are not readily available, use an alcohol-based hand sanitizer with at least 60% alcohol.  . If coughing or sneezing, cover your mouth and nose by coughing or sneezing into the elbow areas of your shirt or coat, into a tissue or into your sleeve (not your hands). .Langley GaussA MASK when in public  . Avoid shaking hands with others and consider head nods or verbal greetings only. . Avoid touching your eyes, nose, or mouth with unwashed hands.  . Avoid close contact with people who are sick. .Marland Kitchen  Avoid places or events with large numbers of people in one location, like concerts or sporting events. . If you have some symptoms but not all symptoms, continue to monitor at home and seek medical attention if your symptoms worsen. . If you are having a medical emergency, call 911.   [place] / e-Visit: [email or web address]         [facility]: [facility]: [phone]                   [facility]: [phone]     It is flu  season:   >>> Best ways to protect herself from the flu: Receive the yearly flu vaccine, practice good hand hygiene washing with soap and also using hand sanitizer when available, eat a nutritious meals, get adequate rest, hydrate appropriately   Please contact the office if your symptoms worsen or you have concerns that you are not improving.   Thank you for choosing [facility] for your healthcare, and for allowing [place] to partner with you on your healthcare journey. I am thankful to be able to provide care to you today.   [name] FNP-C

## 2020-03-21 NOTE — Assessment & Plan Note (Signed)
Plan: Continue Breo Ellipta 100 Continue Spiriva Respimat 2.5

## 2020-03-21 NOTE — Assessment & Plan Note (Signed)
Progressively worsened dyspnea over the last 2 to 3 months Likely multifactorial given patient's chronic back pain, no interstitial lung disease, sedentary lifestyle, chronic respiratory failure, diastolic dysfunction  Plan: We will plan on moving up patient's breathing test for spirometry with DLCO

## 2020-03-21 NOTE — Progress Notes (Addendum)
_0  ID: [name], male    DOB: [birth year], 79 y.o.   MRN: [id number]  Chief Complaint  Patient presents with  . Follow-up    pt uses pulse oxygen 4 liters needs for exertion and walking short distance    Referring provider: Javier Glazier, MD  HPI:  79 year old male former smoker referred to our office in 2018.  Managed in our office for COPD, interstitial lung disease, and obstructive sleep apnea (managed on CPAP).   PMH: Rheumatoid arthritis (currently on methotrexate)-followed by Dr. Trudie Reed Smoker/ Smoking History: Former Smoker, Quit 2012, 81 pack years Maintenance:  Breo Ellipta 100, Spiriva 2.5 Pt of: Dr. Vaughan Browner  03/24/2020  - Visit   79 year old male former smoker followed in our office for interstitial lung disease, COPD as well as obstructive sleep apnea.  Patient has previously declined antifibrotic's.  He continues to also be followed by rheumatology Dr. Trudie Reed.  He is maintained on methotrexate.  He denies any major rheumatological concerns or recent flares.  Patient reports that over the last 2 to 3 months he has had worsened dyspnea.  He is unsure why.  He does unfortunately continue to battle chronic back pain as well as chronic hip pain.  He is followed by a back surgeon who is also managing his pain medications.  Patient reports adherence to [facility] Hights Ellipta 100 as well as Spiriva Respimat 2.5.  He continues to utilize CPAP therapy  Questionaires / Pulmonary Flowsheets:   ACT:  No flowsheet data found.  MMRC: No flowsheet data found.  Epworth:  No flowsheet data found.  Tests:   Pulmonary function testing: >>>October 2015 from [facility] ratio 58% FEV1 1.54 L 57% predicted, FVC 2.65 L 70% predicted, total lung capacity 157% predicted residual volume 263% predicted DLCO 61% predic   07/04/2018-pulmonary function test- FVC 2.64 (71% predicted), ratio 71, FEV1 70, no significant bronchodilator response, DLCO 54  05/14/2019-pulmonary function test-  FVC 2.40 (64% predicted), postbronchodilator ratio of 66, postbronchodilator FEV1 1.54 (58% predicted), no bronchodilator response, DLCO 13.34 (58% predicted)  11/09/2019-pulmonary function test-FVC 2.16 (58% predicted), postbronchodilator ratio 65, postbronchodilator FEV1 1.52 (58% predicted), positive bronchodilator response in FEV1, mid flow reversibility, DLCO 14.23 (62% predicted)  Chest imaging: March 2018 CT chest lung cancer screening: Rads 2, images, showing nonspecific scarring more in the right lung base than the left, not consistent with an interstitial lung disease 12/19/2017-CT chest lung cancer screening-lung RADS 2, follow-up in 12 months 05/30/2018-chest x-ray-stable bilateral interstitial prominence suggesting chronic interstitial lung disease 06/29/2018-CT chest high-res- spectrum of findings compatible with 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 PSG on 04/25/2000 gave an index of 78/hr.  Ever since then he has  been using CPAP at [facility]  03/13/2019-overnight oximetry test- duration 22 hours 11 minutes and 6 seconds, total time below 88% was 5 hours 29 minutes and 21 seconds, qualifying for o2  03/22/2019- overnight oximetry- on 2 L - duration of sleep 11 hours and 3 minutes, total time below 89% 1 hour and 4 minutes and 19 seconds, total time below 88% 21 minutes and 39 seconds  FENO:  No results found for: NITRICOXIDE  PFT: PFT Results Latest Ref Rng & Units 11/09/2019 05/14/2019 07/04/2018  FVC-Pre L 2.16 2.40 2.64  FVC-Predicted Pre % 58 64 71  FVC-Post L  2.34 2.32 2.59  FVC-Predicted Post % 64 62 70  Pre FEV1/FVC % % 61 66 71  Post FEV1/FCV % % 65 66 72  FEV1-Pre L 1.31 1.57 1.87  FEV1-Predicted Pre % 49 59 70  FEV1-Post L 1.52 1.54  1.86  DLCO UNC% % 62 58 52  DLCO COR %Predicted % 92 84 78  TLC L 4.15 4.47 5.26  TLC % Predicted % 64 69 81  RV % Predicted % 76 89 109    WALK:  SIX MIN WALK 05/14/2019 02/22/2019 11/17/2016  Supplimental Oxygen during Test? (L/min) No No No  Tech Comments: The patient had to stop multiple times during his walk. He maintained the same pace, but had to be put on 4L O2 pulse and able to keep O2 at 92%. walk with pt at slow pace, pt did experience SOB and had to stop multiple times. O2 sat dropped to 86 but quickly recovered with rest. Walk stopped early due to elevevated HR of 154 and c/o slight dizziness. pt walked a moderate pace, did not complete lap 3 d/t back pain.     Imaging: No results found.  Lab Results:  CBC    Component Value Date/Time   WBC 8.6 12/21/2019 1204   RBC 4.12 (L) 12/21/2019 1204   HGB 14.4 12/21/2019 1204   HCT 42.7 12/21/2019 1204   PLT 252 12/21/2019 1204   MCV 103.6 (H) 12/21/2019 1204   MCH 35.0 (H) 12/21/2019 1204   MCHC 33.7 12/21/2019 1204   RDW 12.6 12/21/2019 1204   LYMPHSABS 1,789 12/21/2019 1204   MONOABS 1.6 (H) 10/30/2019 1143   EOSABS 155 12/21/2019 1204   BASOSABS 60 12/21/2019 1204    BMET    Component Value Date/Time   NA 139 10/30/2019 1143   K 4.0 10/30/2019 1143   CL 97 10/30/2019 1143   CO2 32 10/30/2019 1143   GLUCOSE 113 (H) 10/30/2019 1143   BUN 12 10/30/2019 1143   CREATININE 1.05 10/30/2019 1143   CREATININE 0.93 08/27/2019 1132   CALCIUM 9.6 10/30/2019 1143   GFRNONAA 78 02/21/2019 1125   GFRAA 91 02/21/2019 1125    BNP    Component Value Date/Time   BNP 120.0 (H) 07/13/2018 1858    ProBNP    Component Value Date/Time   PROBNP 146.0 (H) 10/30/2019 1143    Specialty Problems      Pulmonary Problems   GOLD COPD II B           OSA on CPAP    >>>N. PSG on 04/25/2000 gave an index of 78/hr.  Ever since then he has  been using CPAP at 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  Sulfa Antibiotics Shortness Of Breath  . Sulfamethoxazole-Trimethoprim Shortness Of Breath  . Sulfasalazine Shortness Of Breath  . Other Other (See Comments)  . Varenicline Other (See Comments)    Strange thoughts Strange thoughts SUICIDAL  . Lisinopril Cough    Immunization History  Administered Date(s) Administered  . 19-influenza Whole 07/18/2012, 06/14/2014, 08/12/2015, 06/03/2016  . HiB (PRP-T) 08/23/2017  . Influenza, High Dose Seasonal PF 06/12/2018, 07/06/2019  . Influenza,inj,Quad PF,6+ Mos 06/28/2017  . Influenza-Unspecified 07/18/2012, 06/14/2014, 08/12/2015, 06/03/2016, 06/28/2017  . Meningococcal B, OMV 08/23/2017, 09/21/2017  . Meningococcal Conjugate 08/23/2017  . Meningococcal Mcv4o 08/23/2017, 10/18/2017  . Moderna SARS-COVID-2 Vaccination 10/11/2019, 11/06/2019  . Pneumococcal Conjugate-13 11/28/2013, 11/28/2013  . Pneumococcal Polysaccharide-23 12/26/2005, 09/13/2012  . Pneumococcal-Unspecified 12/26/2005, 09/13/2012  . Tdap 11/28/2013, 11/28/2013  . Zoster 06/06/2012, 06/06/2012    Past Medical History:  Diagnosis Date  . Chronic lower back pain   . Complication of anesthesia 01/2017   was in ICU due to breathing complications-  . COPD (chronic  obstructive pulmonary disease) ([place])   . Degenerative scoliosis in adult patient   . Depression   . Dyspnea    on exertion  . GERD (gastroesophageal reflux disease)   . H/O seasonal allergies   . High cholesterol   . 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 History of kidney stones   . Hypertension   . 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 OSA on CPAP    wears CPAP  . Pneumonia    "I've had it several times; last time was end of Jan 2018" (05/02/2017)  . Pre-diabetes   . Prostate cancer ([place])   . 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 Thrush 12/07/2017  . Wears glasses     Tobacco History: Social History   Tobacco Use  Smoking Status Former Smoker  . Packs/day: 1.50  . Years: 54.00  . Pack years: 81.00  . Types: Cigarettes  . Start date: 02/21/1961  . Quit date: 09/27/2014  . Years since quitting: 5.4  Smokeless Tobacco Never Used  Tobacco Comment   05/02/2017 "quit vaping 09/2016"   Counseling given: Yes Comment: 05/02/2017 "quit vaping 09/2016"   Continue to not smoke  Outpatient Encounter Medications as of 03/21/2020  Medication Sig  . albuterol (PROVENTIL HFA;VENTOLIN HFA) 108 (90 Base) MCG/ACT inhaler Inhale 2 puffs into the lungs every 6 (six) hours as needed for wheezing or shortness of breath.  Marland Kitchen BREO ELLIPTA 100-25 MCG/INH AEPB INHALE 1 PUFF INTO LUNGS DAILY  . cetirizine (ZYRTEC) 10 MG tablet Take 1 tablet by mouth as needed.  . DULoxetine (CYMBALTA) 60 MG capsule Take 60 mg by mouth every evening.   . finasteride (PROSCAR) 5 MG tablet Take 5 mg by mouth at bedtime.   . fluticasone (FLONASE) 50 MCG/ACT nasal spray Place 2 sprays into both nostrils daily as needed for allergies.   . folic acid (FOLVITE) 1 MG tablet Take 1 mg by mouth daily.  . furosemide (LASIX) 20 MG tablet Take 1 tablet (63m total) daily for next 7 days for fluid management  . gabapentin (NEURONTIN) 300 MG capsule Take  800 mg by mouth 3 (three) times daily.   .Marland Kitchenibuprofen (ADVIL) 200 MG tablet Take by mouth daily as needed. 800 - 1,200 as needed  . losartan (COZAAR) 25 MG tablet TAKE ONE (1) TABLET BY MOUTH EACH DAY  . metFORMIN (GLUCOPHAGE) 500 MG tablet Take 1 tablet  by mouth daily.  . methotrexate (RHEUMATREX) 2.5 MG tablet Take 2.5 mg by mouth once a week. Caution:Chemotherapy. Protect from light.  . metoprolol succinate (TOPROL-XL) 25 MG 24 hr tablet Take 50 mg by mouth 2 (two) times daily.   . predniSONE (DELTASONE) 5 MG tablet Take 5 mg by mouth daily with breakfast.  . simvastatin (ZOCOR) 40 MG tablet Take 40 mg by mouth at bedtime.   . sodium chloride (OCEAN) 0.65 % SOLN nasal spray Place 1 spray into both nostrils as needed for congestion.  Marland Kitchen SPIRIVA RESPIMAT 2.5 MCG/ACT AERS INHALE TWO (2) PUFFS INTO THE LUNGS ONCE DAILY  . tamsulosin (FLOMAX) 0.4 MG CAPS capsule Take 0.4 mg by mouth at bedtime.   . traZODone (DESYREL) 50 MG tablet Take 50 mg by mouth at bedtime.   No facility-administered encounter medications on file as of 03/21/2020.     Review of Systems  Review of Systems  Constitutional: Positive for fatigue. Negative for activity change, chills, fever and unexpected weight change.  HENT: Negative for postnasal drip, rhinorrhea, sinus pressure, sinus pain and sore throat.   Eyes: Negative.   Respiratory: Positive for shortness of breath. Negative for cough and wheezing.   Cardiovascular: Negative for chest pain and palpitations.  Gastrointestinal: Negative for constipation, diarrhea, nausea and vomiting.  Endocrine: Negative.   Genitourinary: Negative.   Musculoskeletal: Positive for back pain and joint swelling.  Skin: Negative.   Neurological: Negative for dizziness and headaches.  Psychiatric/Behavioral: Negative.  Negative for dysphoric mood. The patient is not nervous/anxious.   All other systems reviewed and are negative.    Physical Exam  BP 124/86 (BP Location: Left Arm,  Cuff Size: Normal)   Pulse 92   Temp 98.4 F (36.9 C) (Oral)   Ht _0  (1.702 m)   Wt 243 lb (110.2 kg)   SpO2 91%   BMI 38.06 kg/m   Wt Readings from Last 5 Encounters:  03/21/20 243 lb (110.2 kg)  12/21/19 244 lb (110.7 kg)  11/21/19 244 lb (110.7 kg)  11/09/19 246 lb (111.6 kg)  08/27/19 242 lb (109.8 kg)    BMI Readings from Last 5 Encounters:  03/21/20 38.06 kg/m  12/21/19 38.22 kg/m  11/21/19 38.22 kg/m  11/09/19 38.53 kg/m  08/27/19 37.90 kg/m     Physical Exam Vitals and nursing note reviewed.  Constitutional:      General: He is not in acute distress.    Appearance: Normal appearance. He is obese. He is ill-appearing.  HENT:     Head: Normocephalic and atraumatic.     Right Ear: Hearing and external ear normal.     Left Ear: Hearing and external ear normal.     Nose: Nose normal. No mucosal edema or rhinorrhea.     Right Turbinates: Not enlarged.     Left Turbinates: Not enlarged.     Mouth/Throat:     Mouth: Mucous membranes are dry.     Pharynx: Oropharynx is clear. No oropharyngeal exudate.  Eyes:     Pupils: Pupils are equal, round, and reactive to light.  Cardiovascular:     Rate and Rhythm: Normal rate and regular rhythm.     Pulses: Normal pulses.     Heart sounds: Normal heart sounds. No murmur heard.   Pulmonary:     Effort: Pulmonary effort is normal.     Breath sounds: Rales (Bibasilar) present. No decreased breath sounds or wheezing.  Musculoskeletal:     Cervical back: Normal range of  motion.     Right lower leg: No edema.     Left lower leg: No edema.  Lymphadenopathy:     Cervical: No cervical adenopathy.  Skin:    General: Skin is warm and dry.     Capillary Refill: Capillary refill takes less than 2 seconds.     Findings: No erythema or rash.  Neurological:     General: No focal deficit present.     Mental Status: He is alert and oriented to person, place, and time.     Motor: No weakness.     Coordination: Coordination  normal.     Gait: Gait is intact. Gait normal.  Psychiatric:        Mood and Affect: Mood normal.        Behavior: Behavior normal. Behavior is cooperative.        Thought Content: Thought content normal.        Judgment: Judgment normal.       Assessment & Plan:   OSA on CPAP Plan: Continue CPAP therapy with 3 L of O2 bled in  ILD (interstitial lung disease) ([place]) Plan: Continue 2 L of O2 with physical exertion Continue CPAP therapy with 3 L bled in Continue methotrexate and prednisone under the management of rheumatology Patient is interested in starting antifibrotic's at this time, Ofev paperwork filled out today We will coordinate follow-up with the clinical pharmacy team We will see if we can get the patient moved up and schedule for spirometry with DLCO   GOLD COPD II B Plan: Continue Breo Ellipta 100 Continue Spiriva Respimat 2.5  Rheumatoid arthritis involving multiple sites with positive rheumatoid factor ([place]) Plan: Continue medications as managed by rheumatology Keep follow-up with rheumatology  Medication management Patient is interested in starting Ofev  Plan: We will set up clinical pharmacy visit to review Ofev start  Dyspnea Progressively worsened dyspnea over the last 2 to 3 months Likely multifactorial given patient's chronic back pain, no interstitial lung disease, sedentary lifestyle, chronic respiratory failure, diastolic dysfunction  Plan: We will plan on moving up patient's breathing test for spirometry with DLCO    Return in about 6 weeks (around 05/02/2020), or if symptoms worsen or fail to improve, for Follow up for PFT - 37mn, Spiro with DLCO, Follow up with Dr. MVaughan Browner   BLauraine Rinne NP 03/24/2020   This appointment required 34 minutes of patient care (this includes precharting, chart review, review of results, face-to-face care, etc.).

## 2020-03-24 ENCOUNTER — Telehealth: Payer: Self-pay | Admitting: Pharmacy Technician

## 2020-03-24 DIAGNOSIS — Z79899 Other long term (current) drug therapy: Secondary | ICD-10-CM

## 2020-03-24 DIAGNOSIS — J849 Interstitial pulmonary disease, unspecified: Secondary | ICD-10-CM

## 2020-03-24 NOTE — Telephone Encounter (Signed)
Received message for OFEV. Will update as we work through the benefits process.

## 2020-03-25 NOTE — Telephone Encounter (Signed)
Received notification from [facility] regarding a prior authorization for OFEV 115m. Authorization has been APPROVED from 03/24/20 to 09/26/20.   Authorization # [id number] # [phone] Ran test claim for 1 month supply, patient's copay is $100.

## 2020-03-27 NOTE — Telephone Encounter (Signed)
Called patient to discuss Ofev benefits. Patient states that he is ok with $100 copay. Advised if copay ever becomes unaffordable, we can initiate the copay assistance. Pt inquired if he could fill 90 days at a time, but claims rejected for days supply maximum of 30.   Advised that pharmacist would reach out to discuss the medication more. Please send prescription to [facility]. Faxed [employer] forms.  4:02 PM Beatriz Chancellor, CPhT

## 2020-03-28 ENCOUNTER — Other Ambulatory Visit: Payer: Medicare PPO

## 2020-04-01 MED ORDER — OFEV 150 MG PO CAPS: 150.0000 mg | ORAL_CAPSULE | Freq: Two times a day (BID) | ORAL | 2 refills | Status: DC

## 2020-04-01 NOTE — Telephone Encounter (Signed)
Prescription sent to [facility].  Will call patient tomorrow to review medication and specialty pharmacy refill process.   Mariella Saa, PharmD, [place], CPP Clinical Specialty Pharmacist (Rheumatology and Pulmonology)  04/01/2020 4:15 PM

## 2020-04-03 NOTE — Addendum Note (Signed)
Addended by: Mariella Saa C on: 04/03/2020 02:26 PM   Modules accepted: Orders

## 2020-04-03 NOTE — Telephone Encounter (Signed)
Called patient to discuss Ofev.  He states he received a calll from [facility] last night but was confused.  Advised that Ofev must be filled through a specialty pharmacy and gave him the number to contact the pharmacy to set up first shipment.  Reviewed specialty pharmacy refill process.  Patient verbalized understanding.  Patient counseled on purpose, proper use, and potential adverse effects including diarrhea, nausea, vomiting, abdominal pain, decreased appetite, weight loss, and increased blood pressure. Stressed the importance of routine lab monitoring. Will monitor LFT's every month for the first 6 months of treatment then every 3 months. Will monitor CBC every 3 months.  Standing orders placed.  Ofev dose will be 150 mg capsule every 12 hours with food. Stressed importance of taking with food to minimize stomach upset.  All questions encouraged and answered.  Instructed patient to call with any further questions or concerns.   Mariella Saa, PharmD, [place], CPP Clinical Specialty Pharmacist (Rheumatology and Pulmonology)  04/03/2020 2:25 PM

## 2020-04-09 ENCOUNTER — Other Ambulatory Visit: Payer: Self-pay

## 2020-04-09 ENCOUNTER — Ambulatory Visit (INDEPENDENT_AMBULATORY_CARE_PROVIDER_SITE_OTHER): Payer: Medicare PPO | Admitting: Pulmonary Disease

## 2020-04-09 DIAGNOSIS — J849 Interstitial pulmonary disease, unspecified: Secondary | ICD-10-CM | POA: Diagnosis not present

## 2020-04-09 LAB — PULMONARY FUNCTION TEST
DL/VA % pred: 98 %
DL/VA: 3.91 ml/min/mmHg/L
DLCO cor % pred: 60 %
DLCO cor: 13.71 ml/min/mmHg
DLCO unc % pred: 60 %
DLCO unc: 13.71 ml/min/mmHg
FEF 25-75 Pre: 0.5 L/sec
FEF2575-%Pred-Pre: 26 %
FEV1-%Pred-Pre: 39 %
FEV1-Pre: 1.02 L
FEV1FVC-%Pred-Pre: 81 %
FEV6-%Pred-Pre: 49 %
FEV6-Pre: 1.7 L
FEV6FVC-%Pred-Pre: 107 %
FVC-%Pred-Pre: 47 %
FVC-Pre: 1.73 L
Pre FEV1/FVC ratio: 59 %
Pre FEV6/FVC Ratio: 100 %

## 2020-04-09 NOTE — Progress Notes (Signed)
Spiro/DLCO performed today. 

## 2020-04-10 NOTE — Telephone Encounter (Signed)
Notified patient that we would contact him once you had a chance to review results.  Hi [name].Marland KitchenMarland KitchenWillaim Sheng here: I was in yesterday for a breathing test.  Can you give me a snapshot of the results and change from former tests? I see [name] in about 3 wks but would like to know before then. Also, FYI, I started my OFEV yesterday. I can't believe the ridiculous "retail" price of that drug...nearly $12k a month! [name]

## 2020-04-10 NOTE — Telephone Encounter (Signed)
04/10/2020  Patient's pulmonary function testing results are listed below: FVC 1.73 (47% predicted), ratio 59, FEV1 1.02 (39% addicted), DLCO 13.71 (60% predicted)  Overall you are having a little bit of a hard time taking a full deep breath in.  This can be reviewed in more detail when you see Dr. Vaughan Browner.  I am glad that you have started taking the antifibrotic's.  Yes there is a high refill to all cost with these medications.  [name], FNP

## 2020-04-10 NOTE — Progress Notes (Signed)
Patient has received these results through MyChart message on 04/10/2020.[name], FNP

## 2020-04-10 NOTE — Telephone Encounter (Signed)
The FVC is the amount of liters of air you can take in your full deep breath.  This is one of the doctors that we trend out with your interstitial lung disease and also why we have started you on the antifibrotic medications to help prevent progression of disease.  Or worsened FVC.  Your full vital capacity is slightly lower than where it was before.  This is why I am glad that you started the antifibrotic's.  Keep follow-up for we can review these numbers in person and reviewed previous test results with Dr. Vaughan Browner in a few weeks.  [name], FNP

## 2020-04-18 ENCOUNTER — Other Ambulatory Visit: Payer: Self-pay | Admitting: Pulmonary Disease

## 2020-04-30 ENCOUNTER — Ambulatory Visit: Payer: Medicare PPO | Admitting: Pulmonary Disease

## 2020-04-30 ENCOUNTER — Other Ambulatory Visit: Payer: Self-pay

## 2020-04-30 ENCOUNTER — Encounter: Payer: Self-pay | Admitting: Pulmonary Disease

## 2020-04-30 VITALS — BP 128/80 | HR 90 | Temp 98.3°F | Ht 67.0 in | Wt 244.6 lb

## 2020-04-30 DIAGNOSIS — M0579 Rheumatoid arthritis with rheumatoid factor of multiple sites without organ or systems involvement: Secondary | ICD-10-CM

## 2020-04-30 DIAGNOSIS — I272 Pulmonary hypertension, unspecified: Secondary | ICD-10-CM | POA: Diagnosis not present

## 2020-04-30 DIAGNOSIS — J849 Interstitial pulmonary disease, unspecified: Secondary | ICD-10-CM | POA: Diagnosis not present

## 2020-04-30 LAB — COMPREHENSIVE METABOLIC PANEL
ALT: 21 U/L (ref 0–53)
AST: 20 U/L (ref 0–37)
Albumin: 3.8 g/dL (ref 3.5–5.2)
Alkaline Phosphatase: 56 U/L (ref 39–117)
BUN: 14 mg/dL (ref 6–23)
CO2: 34 mEq/L — ABNORMAL HIGH (ref 19–32)
Calcium: 9 mg/dL (ref 8.4–10.5)
Chloride: 96 mEq/L (ref 96–112)
Creatinine, Ser: 0.86 mg/dL (ref 0.40–1.50)
GFR: 86.05 mL/min (ref >60.00)
Glucose, Bld: 112 mg/dL — ABNORMAL HIGH (ref 70–99)
Potassium: 4.4 mEq/L (ref 3.5–5.1)
Sodium: 136 mEq/L (ref 135–145)
Total Bilirubin: 1.2 mg/dL (ref 0.2–1.2)
Total Protein: 6.5 g/dL (ref 6.0–8.3)

## 2020-04-30 NOTE — Patient Instructions (Signed)
Check comprehensive metabolic panel, proBNP today We will schedule high-res CT in 3 months Refer for right heart catheterization to evaluate pulmonary hypertension  Follow-up and 1 month with me or APP for repeat LFTs.

## 2020-04-30 NOTE — Telephone Encounter (Signed)
Dr. Vaughan Browner please advise on My Chart message. Thank you

## 2020-04-30 NOTE — Progress Notes (Signed)
[name]    [id number]    11-19-1940  Primary Care Physician:Piazza, Christian Mate, MD  Referring Physician: Javier Glazier, MD [place],  [place] [id number]  Problem List: RA ILD, OSA Started Ofev. 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  Has been on chronic methotrexate and folic acid since at least early 2000's.  He has been tried on Biologics and Plaquenil in the past.  Follows with Dr. Trudie Reed, rheumatology.  Pets: No pets Occupation: Retired Hydrologist professor Exposures: No known exposures.  No mold, hot tub, Jacuzzi, down pillows or comforter Smoking history: 80-pack-year smoker.  Quit in 2015 Travel history: Grew up in [facility]  Previously lived in [place], [place].  No significant recent travel Relevant family history: No significant family history of lung disease  Interim history: Started on Ofev for the past 1 month.  Is tolerating it well Overall he feels that his dyspnea has worsened.  Outpatient Encounter Medications as of 04/30/2020  Medication Sig  . albuterol (PROVENTIL HFA;VENTOLIN HFA) 108 (90 Base) MCG/ACT inhaler Inhale 2 puffs into the lungs every 6 (six) hours as needed for wheezing or shortness of breath.  Marland Kitchen BREO ELLIPTA 100-25 MCG/INH AEPB INHALE 1 PUFF INTO LUNGS DAILY  . cetirizine (ZYRTEC) 10 MG tablet Take 1 tablet by mouth as needed.  . DULoxetine (CYMBALTA) 60 MG capsule Take 60 mg by mouth every evening.   . finasteride (PROSCAR) 5 MG tablet Take 5 mg by mouth at bedtime.   . fluticasone (FLONASE) 50 MCG/ACT nasal spray Place 2 sprays into both nostrils daily as needed for allergies.   . folic acid (FOLVITE) 1 MG tablet Take 1 mg by mouth daily.  . furosemide (LASIX) 20 MG tablet Take 1 tablet (48m total) daily for next 7 days for fluid management  . gabapentin (NEURONTIN) 300 MG  capsule Take 800 mg by mouth 3 (three) times daily.   .Marland Kitchenibuprofen (ADVIL) 200 MG tablet Take by mouth daily as needed. 800 - 1,200 as needed  . losartan (COZAAR) 25 MG tablet TAKE ONE (1) TABLET BY MOUTH EACH DAY  . metFORMIN (GLUCOPHAGE) 500 MG tablet Take 1 tablet by mouth daily.  . methotrexate (RHEUMATREX) 2.5 MG tablet Take 2.5 mg by mouth once a week. Caution:Chemotherapy. Protect from light.  . metoprolol succinate (TOPROL-XL) 25 MG 24 hr tablet Take 50 mg by mouth 2 (two) times daily.   . Nintedanib (OFEV) 150 MG CAPS Take 1 capsule (150 mg total) by mouth 2 (two) times daily.  . predniSONE (DELTASONE) 5 MG tablet Take 5 mg by mouth daily with breakfast.  . simvastatin (ZOCOR) 40 MG tablet Take 40 mg by mouth at bedtime.   . sodium chloride (OCEAN) 0.65 % SOLN nasal spray Place 1 spray into both nostrils as needed for congestion.  .Marland KitchenSPIRIVA RESPIMAT 2.5 MCG/ACT AERS INHALE TWO (2) PUFFS INTO THE LUNGS ONCE DAILY  . tamsulosin (FLOMAX) 0.4 MG CAPS capsule Take 0.4 mg by mouth at bedtime.   . traZODone (DESYREL) 50 MG tablet Take 50 mg by mouth at bedtime.   No facility-administered encounter medications on file as of 04/30/2020.    Physical Exam: Blood pressure 128/80, pulse 90, temperature 98.3 F (36.8 C), temperature source Oral, height _0  (1.702 m), weight 244 lb 9.6 oz (110.9 kg), SpO2  95 %. Gen:      No acute distress HEENT:  EOMI, sclera anicteric Neck:     No masses; no thyromegaly Lungs:    Clear to auscultation bilaterally; normal respiratory effort CV:         Regular rate and rhythm; no murmurs Abd:      + bowel sounds; soft, non-tender; no palpable masses, no distension Ext:    No edema; adequate peripheral perfusion Skin:      Warm and dry; no rash Neuro: alert and oriented x 3 Psych: normal mood and affect  Data Reviewed: Imaging: High-resolution CT 06/29/2018- Moderate emphysema. Calcified granulomas.  Subcentimeter pulmonary nodule.  Subpleural reticulation,  mild traction bronchiectasis with basal gradient.  Right greater than left.  No honeycombing.  Slightly worse compared to previous CT scans. Probable UIP.  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  No significant change compared to 2019.  Enlarged PA.  Stable pulmonary nodule I have reviewed the images personally.  PFTs: 05/14/2019 FVC 2.32 [62%], FEV1 1.54 [58%], F/F 66, TLC 4.47 [69%], DLCO 13.34 [58%]  04/09/2020 FVC 1.73 (47%), FEV1 1.02 [39%], F/F 59, DLCO 13.71 [60%] Severe obstruction, moderate diffusion defect  Labs: CBC 04/06/2019-WBC 10.1, eos 0.8%, absolute eosinophil count 81   Sleep PSG 04/25/2000.  Severe sleep apnea, AHI 78/hr.  CPAP download 06/24/2019-100% compliance with average use 12 hours Set at CPAP of 8.  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  CT shows stable fibrosis stable though there has been a deterioration in clinical status and PFTs Given progression he has been started on Ofev Recheck high-res CT in 3 months Check LFTs while on Ofev  Discussed pulmonary rehab but he wants to hold off on this for now  Pulmonary hypertension Schedule right heart cath for evaluation.  He may be a candidate for Tyvaso Continue Lasix Check proBNP  COPD Continue Breo, Spiriva  OSA CPAP download reviewed with good compliance and response to therapy  Health maintenance Up-to-date with flu and pneumonia vaccine at primary care  Plan/Recommendations: - Comprehensive metabolic panel, proBNP - High-res CT in 3 months - Referral for right heart catheterization   Marshell Garfinkel MD [facility] 04/30/2020, 1:37 PM  CC: Javier Glazier, MD

## 2020-04-30 NOTE — Addendum Note (Signed)
Addended by: Suzzanne Cloud E on: 04/30/2020 02:11 PM   Modules accepted: Orders

## 2020-04-30 NOTE — Telephone Encounter (Signed)
Dr. Vaughan Browner please advise on pt's My Chart message. Thank you

## 2020-05-01 ENCOUNTER — Other Ambulatory Visit: Payer: Self-pay | Admitting: Nurse Practitioner

## 2020-05-01 LAB — PRO B NATRIURETIC PEPTIDE: NT-Pro BNP: 213 pg/mL (ref 0–486)

## 2020-05-01 NOTE — Addendum Note (Signed)
Addended by: Elton Sin on: 05/01/2020 01:32 PM   Modules accepted: Orders

## 2020-05-05 ENCOUNTER — Other Ambulatory Visit (HOSPITAL_COMMUNITY): Payer: Medicare PPO

## 2020-05-12 ENCOUNTER — Ambulatory Visit: Payer: Medicare PPO | Admitting: Internal Medicine

## 2020-05-12 ENCOUNTER — Encounter: Payer: Self-pay | Admitting: Internal Medicine

## 2020-05-12 ENCOUNTER — Other Ambulatory Visit: Payer: Self-pay

## 2020-05-12 VITALS — BP 126/74 | HR 93 | Ht 67.0 in | Wt 243.6 lb

## 2020-05-12 DIAGNOSIS — J8410 Pulmonary fibrosis, unspecified: Secondary | ICD-10-CM

## 2020-05-12 DIAGNOSIS — I471 Supraventricular tachycardia: Secondary | ICD-10-CM

## 2020-05-12 DIAGNOSIS — I2729 Other secondary pulmonary hypertension: Secondary | ICD-10-CM

## 2020-05-12 DIAGNOSIS — Z9989 Dependence on other enabling machines and devices: Secondary | ICD-10-CM

## 2020-05-12 DIAGNOSIS — I10 Essential (primary) hypertension: Secondary | ICD-10-CM | POA: Diagnosis not present

## 2020-05-12 DIAGNOSIS — R0602 Shortness of breath: Secondary | ICD-10-CM

## 2020-05-12 DIAGNOSIS — E785 Hyperlipidemia, unspecified: Secondary | ICD-10-CM | POA: Diagnosis not present

## 2020-05-12 DIAGNOSIS — G4733 Obstructive sleep apnea (adult) (pediatric): Secondary | ICD-10-CM

## 2020-05-12 DIAGNOSIS — I4719 Other supraventricular tachycardia: Secondary | ICD-10-CM

## 2020-05-12 LAB — BASIC METABOLIC PANEL
BUN/Creatinine Ratio: 14 (ref 10–24)
BUN: 13 mg/dL (ref 8–27)
CO2: 30 mmol/L — ABNORMAL HIGH (ref 20–29)
Calcium: 9.3 mg/dL (ref 8.6–10.2)
Chloride: 96 mmol/L (ref 96–106)
Creatinine, Ser: 0.95 mg/dL (ref 0.76–1.27)
GFR calc Af Amer: 89 mL/min/{1.73_m2} (ref >59)
GFR calc non Af Amer: 77 mL/min/{1.73_m2} (ref >59)
Glucose: 119 mg/dL — ABNORMAL HIGH (ref 65–99)
Potassium: 4.2 mmol/L (ref 3.5–5.2)
Sodium: 138 mmol/L (ref 134–144)

## 2020-05-12 LAB — CBC WITH DIFFERENTIAL/PLATELET
Basophils Absolute: 0 10*3/uL (ref 0.0–0.2)
Basos: 0 %
EOS (ABSOLUTE): 0.2 10*3/uL (ref 0.0–0.4)
Eos: 2 %
Hematocrit: 41.1 % (ref 37.5–51.0)
Hemoglobin: 13.8 g/dL (ref 13.0–17.7)
Immature Grans (Abs): 0 10*3/uL (ref 0.0–0.1)
Immature Granulocytes: 0 %
Lymphocytes Absolute: 1.5 10*3/uL (ref 0.7–3.1)
Lymphs: 15 %
MCH: 36.4 pg — ABNORMAL HIGH (ref 26.6–33.0)
MCHC: 33.6 g/dL (ref 31.5–35.7)
MCV: 108 fL — ABNORMAL HIGH (ref 79–97)
Monocytes Absolute: 2.1 10*3/uL — ABNORMAL HIGH (ref 0.1–0.9)
Monocytes: 20 %
Neutrophils Absolute: 6.4 10*3/uL (ref 1.4–7.0)
Neutrophils: 63 %
Platelets: 217 10*3/uL (ref 150–450)
RBC: 3.79 x10E6/uL — ABNORMAL LOW (ref 4.14–5.80)
RDW: 13.2 % (ref 11.6–15.4)
WBC: 10.3 10*3/uL (ref 3.4–10.8)

## 2020-05-12 NOTE — H&P (View-Only) (Signed)
° °PCP: Piazza, Michael J, MD °Primary Cardiologist: Dr Crenshaw °Primary EP: Dr Cornie Herrington ° °[name] is a 79 y.o. male who presents today for routine electrophysiology followup.  Since I last saw him in 2019, the patient reports doing reasonably well.   he reports worsening SOB.  He has pulmonary htn and pulmonary fibrosis and is followed by Dr Mannam.  He reports that over the past 6 months, his exercise tolerance has become very limited and his O2 requirement has increased.  + dizziness when SOB.  + palpitations at night. Today, he denies symptoms of chest pain,  lower extremity edema,  presyncope, or syncope.  The patient is otherwise without complaint today.  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  °• OSA on CPAP   ° wears CPAP  °• Pneumonia   ° "I've had it several times; last time was end of Jan 2018" (05/02/2017)  °• Pre-diabetes   °• Prostate cancer (HCC)   °• Rheumatoid arteritis (HCC)   ° "all over" (05/02/2017)  °• Thrush 12/07/2017  °• Wears glasses   ° °Past Surgical History:  °Procedure Laterality Date  °• BACK SURGERY    °• COLONOSCOPY W/ POLYPECTOMY    °• FRACTURE SURGERY    °• HERNIA REPAIR    °• HIP ARTHROPLASTY Left 04/2016  °• INGUINAL HERNIA REPAIR Left 1986  °• JOINT REPLACEMENT    °• LUMBAR WOUND DEBRIDEMENT N/A 04/18/2017  ° Procedure: REPAIR OF LUMBAR PSEUDOMENINGOCELE;  Surgeon: Jenkins, Jeffrey, MD;  Location: [facility];  Service: Neurosurgery;   Laterality: N/A;  REAIR OF LUMBAR PSEUDOMENINGOCELE  °• LUMBAR WOUND DEBRIDEMENT N/A 05/05/2017  ° Procedure: I&D Lumbar Wound;  Surgeon: Jenkins, Jeffrey, MD;  Location: [facility];  Service: Neurosurgery;  Laterality: N/A;  °• MAXIMUM ACCESS (MAS)POSTERIOR LUMBAR INTERBODY FUSION (PLIF) 3 LEVEL  02/17/2017  ° /notes 02/17/2017  °• OPEN SURGICAL REPAIR OF GLUTEAL TENDON Right 11/02/2017  ° Procedure: Right hip gluteal tendon repair;  Surgeon: Aluisio, Frank, MD;  Location: [facility];  Service: Orthopedics;  Laterality: Right;  °• POSTERIOR LAMINECTOMY / DECOMPRESSION LUMBAR SPINE  2010  °• PROSTATE BIOPSY    °• PROSTATE BIOPSY  <2013 X 3  °• SPLENECTOMY  1955  °• TONSILLECTOMY    °• TOTAL HIP ARTHROPLASTY Right 10/2015  °• UMBILICAL HERNIA REPAIR  05/2016  ° ° °ROS- all systems are reviewed and negatives except as per HPI above ° °Current Outpatient Medications  °Medication Sig Dispense Refill  °• albuterol (PROVENTIL HFA;VENTOLIN HFA) 108 (90 Base) MCG/ACT inhaler Inhale 2 puffs into the lungs every 6 (six) hours as needed for wheezing or shortness of breath.    °• BREO ELLIPTA 100-25 MCG/INH AEPB INHALE 1 PUFF INTO LUNGS DAILY 180 each 1  °• cetirizine (ZYRTEC) 10 MG tablet Take 1 tablet by mouth as needed.    °• DULoxetine (CYMBALTA) 60 MG capsule Take 60 mg   by mouth every evening.     °• finasteride (PROSCAR) 5 MG tablet Take 5 mg by mouth at bedtime.     °• fluticasone (FLONASE) 50 MCG/ACT nasal spray Place 2 sprays into both nostrils daily as needed for allergies.     °• folic acid (FOLVITE) 1 MG tablet Take 1 mg by mouth daily.    °• furosemide (LASIX) 20 MG tablet Take 1 tablet (20mg total) daily for next 7 days for fluid management 7 tablet 0  °• gabapentin (NEURONTIN) 300 MG capsule Take 800 mg by mouth 3 (three) times daily.     °• ibuprofen (ADVIL) 200 MG tablet Take by mouth daily as needed. 800 - 1,200 as needed    °• losartan (COZAAR) 25 MG tablet TAKE ONE (1) TABLET BY MOUTH EACH DAY 90 tablet 1  °• metFORMIN  (GLUCOPHAGE) 500 MG tablet Take 1 tablet by mouth daily.    °• methotrexate (RHEUMATREX) 2.5 MG tablet Take 2.5 mg by mouth once a week. Caution:Chemotherapy. Protect from light.    °• metoprolol succinate (TOPROL-XL) 50 MG 24 hr tablet Take 50 mg by mouth 2 (two) times daily.    °• Nintedanib (OFEV) 150 MG CAPS Take 1 capsule (150 mg total) by mouth 2 (two) times daily. 60 capsule 2  °• predniSONE (DELTASONE) 5 MG tablet Take 5 mg by mouth daily with breakfast.    °• simvastatin (ZOCOR) 40 MG tablet Take 40 mg by mouth at bedtime.     °• sodium chloride (OCEAN) 0.65 % SOLN nasal spray Place 1 spray into both nostrils as needed for congestion.    °• SPIRIVA RESPIMAT 2.5 MCG/ACT AERS INHALE TWO (2) PUFFS INTO THE LUNGS ONCE DAILY 12 g 3  °• tamsulosin (FLOMAX) 0.4 MG CAPS capsule Take 0.4 mg by mouth at bedtime.     °• traMADol (ULTRAM) 50 MG tablet Take 50-100 mg by mouth every 6 (six) hours as needed for pain.    °• traZODone (DESYREL) 50 MG tablet Take 50 mg by mouth at bedtime.    ° °No current facility-administered medications for this visit.  ° ° °Physical Exam: °Vitals:  ° 05/12/20 0900  °BP: 126/74  °Pulse: 93  °SpO2: 90%  °Weight: 243 lb 9.6 oz (110.5 kg)  °Height: 5' 7" (1.702 m)  ° ° °GEN- The patient is well appearing, alert and oriented x 3 today.   °Head- normocephalic, atraumatic °Eyes-  Sclera clear, conjunctiva pink °Ears- hearing intact °Oropharynx- clear °Lungs-   normal work of breathing °Heart- Regular rate and rhythm with frequent ectopy °GI- soft  °Extremities- no clubbing, cyanosis, or edema ° °Wt Readings from Last 3 Encounters:  °05/12/20 243 lb 9.6 oz (110.5 kg)  °04/30/20 244 lb 9.6 oz (110.9 kg)  °03/21/20 243 lb (110.2 kg)  ° ° °EKG tracing ordered today is personally reviewed and shows multifocal atrial tachycardia, average rate 90s ° °Assessment and Plan: ° °1. Multifocal Atrial tachycardia °Minimally symptomatic °He is doing well with low dose metoprolol °Treat underlying lung  disease °Currently no indication for anticoagulation ° °2. HTN °Stable °No change required today ° °3. OSA °Compliant with CPAP  ° °4. Interstitial pulmonary fibrosis °Followed by Dr Mannam. °Clinically worsen, with worsening SOB °I agree with Dr Mannam that right heart cath is advised to further evaluate pulmonary HTN (Moderate on prior echo).  Will also update echo to evaluate for cor pulmonale given worsening SOB °Given worsening SOB, I will also advise left heart cath. °I would   therefore recommend right and left heart catheterization with possible PCI.  Discussed the cath with the patient. The patient understands that risks included but are not limited to stroke (1 in 1000), death (1 in 56), kidney failure [usually temporary] (1 in 500), bleeding (1 in 200), allergic reaction [possibly serious] (1 in 200). The patient understands and agrees to proceed.   We will schedule with Dr Aundra Dubin at the next available time.  Follow-up with Drs Vaughan Browner and Aundra Dubin for pulmonary htn going forward.  I will see as needed.   Thompson Grayer MD, [facility] 05/12/2020 9:18 AM

## 2020-05-12 NOTE — Progress Notes (Signed)
PCP: Javier Glazier, MD Primary Cardiologist: Dr Stanford Breed Primary EP: Dr Rayann Heman  [name] is a 79 y.o. male who presents today for routine electrophysiology followup.  Since I last saw him in 2019, the patient reports doing reasonably well.   he reports worsening SOB.  He has pulmonary htn and pulmonary fibrosis and is followed by Dr Vaughan Browner.  He reports that over the past 6 months, his exercise tolerance has become very limited and his O2 requirement has increased.  + dizziness when SOB.  + palpitations at night. Today, he denies symptoms of chest pain,  lower extremity edema,  presyncope, or syncope.  The patient is otherwise without complaint today.   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[place]) 08/10/2017   OSA on CPAP    wears CPAP   Pneumonia    "I've had it several times; last time was end of Jan 2018" (05/02/2017)   Pre-diabetes    Prostate cancer ([place])    Rheumatoid arteritis ([place])    "all over" (05/02/2017)   Thrush 12/07/2017   Wears glasses    Past Surgical History:  Procedure Laterality Date   BACK SURGERY     COLONOSCOPY W/ POLYPECTOMY     FRACTURE SURGERY     HERNIA REPAIR     HIP ARTHROPLASTY Left 04/2016   INGUINAL HERNIA REPAIR Left 1986   JOINT REPLACEMENT     LUMBAR WOUND DEBRIDEMENT N/A 04/18/2017   Procedure: REPAIR OF LUMBAR PSEUDOMENINGOCELE;  Surgeon: Newman Pies, MD;  Location: [facility];  Service: Neurosurgery;   Laterality: N/A;  REAIR OF LUMBAR PSEUDOMENINGOCELE   LUMBAR WOUND DEBRIDEMENT N/A 05/05/2017   Procedure: I&D Lumbar Wound;  Surgeon: Newman Pies, MD;  Location: [facility];  Service: Neurosurgery;  Laterality: N/A;   MAXIMUM ACCESS (MAS)POSTERIOR LUMBAR INTERBODY FUSION (PLIF) 3 LEVEL  02/17/2017   [name] 02/17/2017   OPEN SURGICAL REPAIR OF GLUTEAL TENDON Right 11/02/2017   Procedure: Right hip gluteal tendon repair;  Surgeon: Gaynelle Arabian, MD;  Location: [facility];  Service: Orthopedics;  Laterality: Right;   POSTERIOR LAMINECTOMY / DECOMPRESSION LUMBAR SPINE  2010   PROSTATE BIOPSY     PROSTATE BIOPSY  <2013 X 3   SPLENECTOMY  1955   TONSILLECTOMY     TOTAL HIP ARTHROPLASTY Right 22/6333   UMBILICAL HERNIA REPAIR  05/2016    ROS- all systems are reviewed and negatives except as per HPI above  Current Outpatient Medications  Medication Sig Dispense Refill   albuterol (PROVENTIL HFA;VENTOLIN HFA) 108 (90 Base) MCG/ACT inhaler Inhale 2 puffs into the lungs every 6 (six) hours as needed for wheezing or shortness of breath.     BREO ELLIPTA 100-25 MCG/INH AEPB INHALE 1 PUFF INTO LUNGS DAILY 180 each 1   cetirizine (ZYRTEC) 10 MG tablet Take 1 tablet by mouth as needed.     DULoxetine (CYMBALTA) 60 MG capsule Take 60 mg  by mouth every evening.      finasteride (PROSCAR) 5 MG tablet Take 5 mg by mouth at bedtime.      fluticasone (FLONASE) 50 MCG/ACT nasal spray Place 2 sprays into both nostrils daily as needed for allergies.      folic acid (FOLVITE) 1 MG tablet Take 1 mg by mouth daily.     furosemide (LASIX) 20 MG tablet Take 1 tablet (39m total) daily for next 7 days for fluid management 7 tablet 0   gabapentin (NEURONTIN) 300 MG capsule Take 800 mg by mouth 3 (three) times daily.      ibuprofen (ADVIL) 200 MG tablet Take by mouth daily as needed. 800 - 1,200 as needed     losartan (COZAAR) 25 MG tablet TAKE ONE (1) TABLET BY MOUTH EACH DAY 90 tablet 1   metFORMIN  (GLUCOPHAGE) 500 MG tablet Take 1 tablet by mouth daily.     methotrexate (RHEUMATREX) 2.5 MG tablet Take 2.5 mg by mouth once a week. Caution:Chemotherapy. Protect from light.     metoprolol succinate (TOPROL-XL) 50 MG 24 hr tablet Take 50 mg by mouth 2 (two) times daily.     Nintedanib (OFEV) 150 MG CAPS Take 1 capsule (150 mg total) by mouth 2 (two) times daily. 60 capsule 2   predniSONE (DELTASONE) 5 MG tablet Take 5 mg by mouth daily with breakfast.     simvastatin (ZOCOR) 40 MG tablet Take 40 mg by mouth at bedtime.      sodium chloride (OCEAN) 0.65 % SOLN nasal spray Place 1 spray into both nostrils as needed for congestion.     SPIRIVA RESPIMAT 2.5 MCG/ACT AERS INHALE TWO (2) PUFFS INTO THE LUNGS ONCE DAILY 12 g 3   tamsulosin (FLOMAX) 0.4 MG CAPS capsule Take 0.4 mg by mouth at bedtime.      traMADol (ULTRAM) 50 MG tablet Take 50-100 mg by mouth every 6 (six) hours as needed for pain.     traZODone (DESYREL) 50 MG tablet Take 50 mg by mouth at bedtime.     No current facility-administered medications for this visit.    Physical Exam: Vitals:   05/12/20 0900  BP: 126/74  Pulse: 93  SpO2: 90%  Weight: 243 lb 9.6 oz (110.5 kg)  Height: _0  (1.702 m)    GEN- The patient is well appearing, alert and oriented x 3 today.   Head- normocephalic, atraumatic Eyes-  Sclera clear, conjunctiva pink Ears- hearing intact Oropharynx- clear Lungs-   normal work of breathing Heart- Regular rate and rhythm with frequent ectopy GI- soft  Extremities- no clubbing, cyanosis, or edema  Wt Readings from Last 3 Encounters:  05/12/20 243 lb 9.6 oz (110.5 kg)  04/30/20 244 lb 9.6 oz (110.9 kg)  03/21/20 243 lb (110.2 kg)    EKG tracing ordered today is personally reviewed and shows multifocal atrial tachycardia, average rate 90s  Assessment and Plan:  1. Multifocal Atrial tachycardia Minimally symptomatic He is doing well with low dose metoprolol Treat underlying lung  disease Currently no indication for anticoagulation  2. HTN Stable No change required today  3. OSA Compliant with CPAP   4. Interstitial pulmonary fibrosis Followed by Dr MVaughan Browner Clinically worsen, with worsening SOB I agree with Dr MVaughan Brownerthat right heart cath is advised to further evaluate pulmonary HTN (Moderate on prior echo).  Will also update echo to evaluate for cor pulmonale given worsening SOB Given worsening SOB, I will also advise left heart cath. I would  therefore recommend right and left heart catheterization with possible PCI.  Discussed the cath with the patient. The patient understands that risks included but are not limited to stroke (1 in 1000), death (1 in 64), kidney failure [usually temporary] (1 in 500), bleeding (1 in 200), allergic reaction [possibly serious] (1 in 200). The patient understands and agrees to proceed.   We will schedule with Dr Aundra Dubin at the next available time.  Follow-up with Drs Vaughan Browner and Aundra Dubin for pulmonary htn going forward.  I will see as needed.   Thompson Grayer MD, [facility] 05/12/2020 9:18 AM

## 2020-05-12 NOTE — Patient Instructions (Signed)
Medication Instructions:  Your physician recommends that you continue on your current medications as directed. Please refer to the Current Medication list given to you today.  *If you need a refill on your cardiac medications before your next appointment, please call your pharmacy*  Lab Work: BMP, CBC  If you have labs (blood work) drawn today and your tests are completely normal, you will receive your results only by: Marland Kitchen MyChart Message (if you have MyChart) OR . A paper copy in the mail If you have any lab test that is abnormal or we need to change your treatment, we will call you to review the results.  Testing/Procedures: Left and Right Heart   Follow-Up: At [facility], you and your health needs are our priority.  As part of our continuing mission to provide you with exceptional heart care, we have created designated Provider Care Teams.  These Care Teams include your primary Cardiologist (physician) and Advanced Practice Providers (APPs -  Physician Assistants and Nurse Practitioners) who all work together to provide you with the care you need, when you need it.  We recommend signing up for the patient portal called "MyChart".  Sign up information is provided on this After Visit Summary.  MyChart is used to connect with patients for Virtual Visits (Telemedicine).  Patients are able to view lab/test results, encounter notes, upcoming appointments, etc.  Non-urgent messages can be sent to your provider as well.   To learn more about what you can do with MyChart, go to [email or web address].     Angiogram  An angiogram is a procedure used to examine the blood vessels. In this procedure, contrast dye is injected through a long, thin tube (catheter) into an artery. X-rays are then taken, which show if there is a blockage or problem in a blood vessel. The catheter may be inserted in:  The groin area. This is the most common.  The fold of the arm, near the elbow.  The wrist. Tell  a health care provider about:  Any allergies you have, including allergies to medicines, shellfish, contrast dye, or iodine.  All medicines you are taking, including vitamins, herbs, eye drops, creams, and over-the-counter medicines.  Any problems you or family members have had with anesthetic medicines.  Any blood disorders you have.  Any surgeries you have had.  Any medical conditions you have or have had, including any kidney problems or kidney failure.  Whether you are pregnant or may be pregnant.  Whether you are breastfeeding. What are the risks? Generally, this is a safe procedure. However, problems may occur, including:  Infection.  Bleeding and bruising.  Allergic reactions to medicines or dyes, including the contrast dye used.  Damage to nearby structures or organs, including damage to blood vessels and kidney damage from the contrast dye.  Blood clots that can lead to a stroke or heart attack. What happens before the procedure? Staying hydrated Follow instructions from your health care provider about hydration, which may include:  Up to 2 hours before the procedure - you may continue to drink clear liquids, such as water, clear fruit juice, black coffee, and plain tea.  Eating and drinking restrictions Follow instructions from your health care provider about eating and drinking, which may include:  8 hours before the procedure - stop eating heavy meals or foods, such as meat, fried foods, or fatty foods.  6 hours before the procedure - stop eating light meals or foods, such as toast or cereal.  2 hours before  the procedure - stop drinking clear liquids. Medicines Ask your health care provider about:  Changing or stopping your regular medicines. This is especially important if you are taking diabetes medicines or blood thinners.  Taking medicines such as aspirin and ibuprofen. These medicines can thin your blood. Do not take these medicines unless your health  care provider tells you to take them.  Taking over-the-counter medicines, vitamins, herbs, and supplements. Surgery safety Ask your health care provider:  How your insertion site will be marked.  What steps will be taken to help prevent infection. These may include: ? Removing hair at the insertion site. ? Washing skin with a germ-killing soap. ? Taking antibiotic medicine. General instructions  Do not use any products that contain nicotine or tobacco for at least 4 weeks before the procedure. These products include cigarettes, e-cigarettes, and chewing tobacco. If you need help quitting, ask your health care provider.  You may have blood samples taken.  Plan to have someone take you home from the hospital or clinic.  If you will be going home right after the procedure, plan to have someone with you for 24 hours. What happens during the procedure?  You will lie on your back on an X-ray table. You may be strapped to the table if it is tilted.  An IV will be inserted into one of your veins.  Electrodes may be placed on your chest to monitor your heart rate during the procedure.  You will be given one or both of the following: ? A medicine to help you relax (sedative). ? A medicine to numb the area where the catheter will be inserted (local anesthetic).  A small incision will be made for catheter insertion.  The catheter will be inserted into an artery using a guide wire. An X-ray procedure (fluoroscopy) will be used to help guide the catheter to the blood vessel to be examined.  A contrast dye will then be injected into the catheter, and X-rays will be taken. The contrast will help to show where any narrowing or blockages are located in the blood vessels. You may feel flushed as the contrast dye is injected.  Tell your health care provider if you develop chest pain or trouble breathing.  After the fluoroscopy is complete, the catheter will be removed.  A bandage (dressing)  will be placed over the site where the catheter was inserted. Pressure will be applied to help stop any bleeding.  The IV will be removed. The procedure may vary among health care providers and hospitals. What happens after the procedure?  Your blood pressure, heart rate, breathing rate, and blood oxygen level will be monitored until you leave the hospital or clinic.  You will be kept in bed lying flat for 6 hours. If the catheter was inserted through your leg, you will be instructed not to bend or cross your legs.  The insertion area and the pulse in your feet or wrist will be checked frequently.  You will be instructed to drink plenty of fluids. This will help wash the contrast dye out of your body.  You may have more blood tests and X-rays. You may also have a test that records the electrical activity of your heart (electrocardiogram, or ECG).  Do not drive for 24 hours if you were given a sedative during your procedure.  It is up to you to get the results of your procedure. Ask your health care provider, or the department that is doing the procedure, when  your results will be ready. Summary  An angiogram is a procedure used to examine the blood vessels.  Before the procedure, follow your health care provider's instructions about eating and drinking restrictions. You may be asked to stop eating and drinking several hours before the procedure.  During the procedure, contrast dye is injected through a thin tube (catheter) into an artery. X-rays are then taken.  After the procedure, you will need to drink plenty of fluids and lie flat for 6 hour. This information is not intended to replace advice given to you by your health care provider. Make sure you discuss any questions you have with your health care provider. Document Revised: 03/28/2019 Document Reviewed: 03/28/2019 Elsevier Patient Education  [place].

## 2020-05-14 ENCOUNTER — Telehealth: Payer: Self-pay | Admitting: Internal Medicine

## 2020-05-14 NOTE — Telephone Encounter (Signed)
Spoke with the patient and advised him that Dr. Claris Gladden nurse would be in contact with him to schedule his R/L heart cath.   Patient also states that he has an echocardiogram scheduled for tomorrow and he is waiting for it to be authorized by insurance. Advised patient that I would send his message to the pre-cert team to check on this. He states if it has not been approved by tomorrow he needs to know so he can reschedule his appointment.

## 2020-05-14 NOTE — Telephone Encounter (Signed)
Patient states that he is to have a heart cath done per Dr. Jackalyn Lombard request. He states it is to be done by Dr. Aundra Dubin and he would like to know how to go about getting this scheduled. Also, his echo is scheduled for tomorrow. He states he was told to contact our office to schedule due to prior authorization by the pre-admit team at [facility]. Just making sure it is okay to cancel his echo for tomorrow and reschedule at our office. Please advise.

## 2020-05-14 NOTE — Telephone Encounter (Signed)
Spoke with the patient and advised him that echocardiogram was approved.

## 2020-05-15 ENCOUNTER — Ambulatory Visit (HOSPITAL_COMMUNITY)
Admission: RE | Admit: 2020-05-15 | Discharge: 2020-05-15 | Disposition: A | Discharge status: Home / Self Care | Payer: Medicare PPO | Source: Ambulatory Visit | Attending: Internal Medicine | Admitting: Internal Medicine

## 2020-05-15 ENCOUNTER — Other Ambulatory Visit: Payer: Self-pay

## 2020-05-15 DIAGNOSIS — I358 Other nonrheumatic aortic valve disorders: Secondary | ICD-10-CM | POA: Insufficient documentation

## 2020-05-15 DIAGNOSIS — I119 Hypertensive heart disease without heart failure: Secondary | ICD-10-CM | POA: Insufficient documentation

## 2020-05-15 DIAGNOSIS — J8410 Pulmonary fibrosis, unspecified: Secondary | ICD-10-CM | POA: Diagnosis not present

## 2020-05-15 DIAGNOSIS — I471 Supraventricular tachycardia: Secondary | ICD-10-CM | POA: Diagnosis not present

## 2020-05-15 DIAGNOSIS — I2729 Other secondary pulmonary hypertension: Secondary | ICD-10-CM | POA: Diagnosis not present

## 2020-05-15 DIAGNOSIS — E785 Hyperlipidemia, unspecified: Secondary | ICD-10-CM | POA: Insufficient documentation

## 2020-05-15 DIAGNOSIS — Z9989 Dependence on other enabling machines and devices: Secondary | ICD-10-CM | POA: Diagnosis not present

## 2020-05-15 DIAGNOSIS — R0600 Dyspnea, unspecified: Secondary | ICD-10-CM | POA: Insufficient documentation

## 2020-05-15 DIAGNOSIS — I10 Essential (primary) hypertension: Secondary | ICD-10-CM

## 2020-05-15 DIAGNOSIS — G4733 Obstructive sleep apnea (adult) (pediatric): Secondary | ICD-10-CM | POA: Insufficient documentation

## 2020-05-15 DIAGNOSIS — J449 Chronic obstructive pulmonary disease, unspecified: Secondary | ICD-10-CM | POA: Insufficient documentation

## 2020-05-15 DIAGNOSIS — R0602 Shortness of breath: Secondary | ICD-10-CM

## 2020-05-15 LAB — ECHOCARDIOGRAM COMPLETE
Area-P 1/2: 2.26 cm2
S' Lateral: 2.7 cm

## 2020-05-15 NOTE — Telephone Encounter (Signed)
Spoke with Dr. Claris Gladden nurse Nira Conn who gave me dates that he would be in the cath lab.  I have spoken with the patient and he is set up for R/L heart cath with Dr. Aundra Dubin on 08/31 at 12:00. Will send patient instructions through [place].

## 2020-05-15 NOTE — Progress Notes (Signed)
  Echocardiogram 2D Echocardiogram has been performed.  [name] 05/15/2020, 10:03 AM

## 2020-05-15 NOTE — Telephone Encounter (Signed)
Dr. Vaughan Browner please advise on several messages from patient mychart  Dr. Vaughan Browner, another question I wanted to ask is where can I find a study or two about the actual results of taking Ofev for patients with various conditions including my set of variables with RA, no spleen, using CPAP for 20 years...and so forth? Thanks for helping me and my wife better understand the variables and effectiveness of various treatments based upon hard data. Willaim Sheng   2)I'm trying to make some sense of my recent breathing loss and what the test results mean. Also, wondering about studies that have narrowed down the test group by "existing conditions."   3)Frankly, I find the total lack of response unacceptable.  Is there something I should know to better understand this situation? Willaim Sheng

## 2020-05-20 ENCOUNTER — Telehealth (HOSPITAL_COMMUNITY): Payer: Self-pay

## 2020-05-20 NOTE — Telephone Encounter (Signed)
I apologize for the delay as the messages got lost in my inbox  We base our recommendation for Ofev in patients with pulmonary fibrosis in the setting of rheumatoid arthritis based on a study called [employer].  In that study they showed slowdown of progression of disease with patients given this therapy. Unfortunately they did not examine other effect of other conditions such as splenectomy, CPAP use etc. I am adding some links to references below.  I am happy to chat over the telephone if you give me a time to call  [email or web address]  Https://www.nejm.org/doi/full/10.1056/NEJMoa1908681

## 2020-05-20 NOTE — Telephone Encounter (Signed)
RT Heart Cath CPT Code 89842 for 05/27/2020 approved through [facility]. [id number]#[id number].

## 2020-05-21 NOTE — Telephone Encounter (Signed)
Dr. Vaughan Browner please advise  Dr. Vaughan Browner, thanks for your response.  I read the article and the research report.  If I understand correctly the main measure they relied on was the [facility] measure as supplemented by scans.  Am I correct? This is why in my other message I asked for your help in explaining the difference in FVC values of my last two spirometry tests.  The second test results were not posted at the time I last visited with you. Will you look at those two and tell me what the difference mean? Thanks [employer]

## 2020-05-21 NOTE — Telephone Encounter (Signed)
That is correct. The main outcome they measured was FVC decline which is also a surrogate marker for mortality.   Ref Range & Units 1 mo ago  (04/09/20) 6 mo ago  (11/09/19) 1 yr ago  (05/14/19) 1 yr ago  (07/04/18)  FVC-Pre L 1.73  2.16  2.40 P  2.64 VC    This is your FVC trend from Oct 2019 to July 2021.  As you can see it has been declining. It is measured in liters. It went from 2.64 lt about 2 years ago to 1.73 lt. That is about 900cc loss in 19 months.  That is the reason for starting ofev- to slow down the rate of decline.

## 2020-05-22 NOTE — Telephone Encounter (Signed)
Dr. Vaughan Browner please advise on patient question.  Thanks!   Thank you for the progression chart. I guess my next and last (for now) question is are there "absolute" numbers in [facility] where mortality/death is near or  imminent? [name]

## 2020-05-22 NOTE — Telephone Encounter (Signed)
No absolute numbers to predict imminent mortality

## 2020-05-24 ENCOUNTER — Other Ambulatory Visit (HOSPITAL_COMMUNITY)
Admission: RE | Admit: 2020-05-24 | Discharge: 2020-05-24 | Disposition: A | Discharge status: Home / Self Care | Payer: Medicare PPO | Source: Ambulatory Visit | Attending: Cardiology | Admitting: Cardiology

## 2020-05-24 DIAGNOSIS — Z20822 Contact with and (suspected) exposure to covid-19: Secondary | ICD-10-CM | POA: Diagnosis not present

## 2020-05-24 DIAGNOSIS — Z01812 Encounter for preprocedural laboratory examination: Secondary | ICD-10-CM | POA: Diagnosis present

## 2020-05-24 LAB — SARS CORONAVIRUS 2 (TAT 6-24 HRS): SARS Coronavirus 2: NEGATIVE

## 2020-05-26 ENCOUNTER — Telehealth: Payer: Self-pay | Admitting: *Deleted

## 2020-05-26 NOTE — Telephone Encounter (Signed)
Pt contacted pre-catheterization scheduled at [facility] for: Tuesday May 27, 2020 12 Noon Verified arrival time and place: [facility]) at: 10 AM   No solid food after midnight prior to cath, clear liquids until 5 AM day of procedure.  Hold: Lasix-AM of procedure Metformin-day of procedure and 48 hours post procedure   Except hold medications AM meds can be  taken pre-cath with sips of water including: ASA 81 mg   Confirmed patient has responsible adult to drive home post procedure and observe 24 hours after arriving home: yes  You are allowed ONE visitor in the waiting room during the time you are at the hospital for your procedure. Both you and your visitor must wear a mask once you enter the hospital.       COVID-19 Pre-Screening Questions:   In the past 10 days have you had a new cough, shortness of breath, headache, congestion, fever (100 or greater) unexplained body aches, new sore throat, or sudden loss of taste or sense of smell? no  In the past 10 days have you been around anyone with known Covid 19? no  Have you been vaccinated for COVID-19? Yes, see immunization history   Reviewed procedure/mask/visitor instructions, COVID-19 questions with patient.

## 2020-05-27 ENCOUNTER — Other Ambulatory Visit: Payer: Self-pay

## 2020-05-27 ENCOUNTER — Encounter (HOSPITAL_COMMUNITY)
Admission: RE | Disposition: A | Discharge status: Home / Self Care | Payer: Self-pay | Source: Home / Self Care | Attending: Cardiology

## 2020-05-27 ENCOUNTER — Ambulatory Visit (HOSPITAL_COMMUNITY)
Admission: RE | Admit: 2020-05-27 | Discharge: 2020-05-27 | Disposition: A | Discharge status: Home / Self Care | Payer: Medicare PPO | Attending: Cardiology | Admitting: Cardiology

## 2020-05-27 ENCOUNTER — Other Ambulatory Visit (HOSPITAL_COMMUNITY): Payer: Medicare PPO

## 2020-05-27 DIAGNOSIS — Z7952 Long term (current) use of systemic steroids: Secondary | ICD-10-CM | POA: Diagnosis not present

## 2020-05-27 DIAGNOSIS — K219 Gastro-esophageal reflux disease without esophagitis: Secondary | ICD-10-CM | POA: Diagnosis not present

## 2020-05-27 DIAGNOSIS — F329 Major depressive disorder, single episode, unspecified: Secondary | ICD-10-CM | POA: Diagnosis not present

## 2020-05-27 DIAGNOSIS — M069 Rheumatoid arthritis, unspecified: Secondary | ICD-10-CM | POA: Insufficient documentation

## 2020-05-27 DIAGNOSIS — R0600 Dyspnea, unspecified: Secondary | ICD-10-CM | POA: Diagnosis not present

## 2020-05-27 DIAGNOSIS — Z7984 Long term (current) use of oral hypoglycemic drugs: Secondary | ICD-10-CM | POA: Diagnosis not present

## 2020-05-27 DIAGNOSIS — Z8546 Personal history of malignant neoplasm of prostate: Secondary | ICD-10-CM | POA: Diagnosis not present

## 2020-05-27 DIAGNOSIS — J449 Chronic obstructive pulmonary disease, unspecified: Secondary | ICD-10-CM | POA: Insufficient documentation

## 2020-05-27 DIAGNOSIS — I471 Supraventricular tachycardia: Secondary | ICD-10-CM | POA: Diagnosis not present

## 2020-05-27 DIAGNOSIS — I2510 Atherosclerotic heart disease of native coronary artery without angina pectoris: Secondary | ICD-10-CM | POA: Diagnosis not present

## 2020-05-27 DIAGNOSIS — I272 Pulmonary hypertension, unspecified: Secondary | ICD-10-CM | POA: Diagnosis not present

## 2020-05-27 DIAGNOSIS — J8410 Pulmonary fibrosis, unspecified: Secondary | ICD-10-CM | POA: Insufficient documentation

## 2020-05-27 DIAGNOSIS — I10 Essential (primary) hypertension: Secondary | ICD-10-CM | POA: Insufficient documentation

## 2020-05-27 DIAGNOSIS — G4733 Obstructive sleep apnea (adult) (pediatric): Secondary | ICD-10-CM | POA: Diagnosis not present

## 2020-05-27 DIAGNOSIS — Z7951 Long term (current) use of inhaled steroids: Secondary | ICD-10-CM | POA: Insufficient documentation

## 2020-05-27 DIAGNOSIS — R0602 Shortness of breath: Secondary | ICD-10-CM | POA: Diagnosis not present

## 2020-05-27 DIAGNOSIS — Z79899 Other long term (current) drug therapy: Secondary | ICD-10-CM | POA: Insufficient documentation

## 2020-05-27 DIAGNOSIS — R7303 Prediabetes: Secondary | ICD-10-CM | POA: Insufficient documentation

## 2020-05-27 DIAGNOSIS — E7800 Pure hypercholesterolemia, unspecified: Secondary | ICD-10-CM | POA: Insufficient documentation

## 2020-05-27 HISTORY — PX: RIGHT HEART CATH AND CORONARY ANGIOGRAPHY: CATH118264

## 2020-05-27 LAB — POCT I-STAT EG7
Acid-Base Excess: 5 mmol/L — ABNORMAL HIGH (ref 0.0–2.0)
Acid-Base Excess: 6 mmol/L — ABNORMAL HIGH (ref 0.0–2.0)
Bicarbonate: 32.8 mmol/L — ABNORMAL HIGH (ref 20.0–28.0)
Bicarbonate: 33.5 mmol/L — ABNORMAL HIGH (ref 20.0–28.0)
Calcium, Ion: 1.2 mmol/L (ref 1.15–1.40)
Calcium, Ion: 1.22 mmol/L (ref 1.15–1.40)
HCT: 40 % (ref 39.0–52.0)
HCT: 40 % (ref 39.0–52.0)
Hemoglobin: 13.6 g/dL (ref 13.0–17.0)
Hemoglobin: 13.6 g/dL (ref 13.0–17.0)
O2 Saturation: 69 %
O2 Saturation: 71 %
Potassium: 3.8 mmol/L (ref 3.5–5.1)
Potassium: 3.8 mmol/L (ref 3.5–5.1)
Sodium: 141 mmol/L (ref 135–145)
Sodium: 141 mmol/L (ref 135–145)
TCO2: 35 mmol/L — ABNORMAL HIGH (ref 22–32)
TCO2: 35 mmol/L — ABNORMAL HIGH (ref 22–32)
pCO2, Ven: 61.2 mmHg — ABNORMAL HIGH (ref 44.0–60.0)
pCO2, Ven: 61.2 mmHg — ABNORMAL HIGH (ref 44.0–60.0)
pH, Ven: 7.337 (ref 7.250–7.430)
pH, Ven: 7.346 (ref 7.250–7.430)
pO2, Ven: 40 mmHg (ref 32.0–45.0)
pO2, Ven: 41 mmHg (ref 32.0–45.0)

## 2020-05-27 LAB — GLUCOSE, CAPILLARY: Glucose-Capillary: 112 mg/dL — ABNORMAL HIGH (ref 70–99)

## 2020-05-27 SURGERY — RIGHT HEART CATH AND CORONARY ANGIOGRAPHY
Anesthesia: LOCAL

## 2020-05-27 MED ORDER — SODIUM CHLORIDE 0.9% FLUSH: 3.0000 mL | Freq: Two times a day (BID) | INTRAVENOUS | Status: DC

## 2020-05-27 MED ORDER — HYDRALAZINE HCL 20 MG/ML IJ SOLN: 10.0000 mg | INTRAMUSCULAR | Status: DC | PRN

## 2020-05-27 MED ORDER — SODIUM CHLORIDE 0.9 % IV SOLN: INTRAVENOUS | Status: DC

## 2020-05-27 MED ORDER — SODIUM CHLORIDE 0.9 % WEIGHT BASED INFUSION: 1.0000 mL/kg/h | INTRAVENOUS | Status: DC

## 2020-05-27 MED ORDER — HEPARIN (PORCINE) IN NACL 1000-0.9 UT/500ML-% IV SOLN
INTRAVENOUS | Status: DC | PRN
  Administered 2020-05-27: 500 mL

## 2020-05-27 MED ORDER — MIDAZOLAM HCL 2 MG/2ML IJ SOLN
INTRAMUSCULAR | Status: DC | PRN
  Administered 2020-05-27 (×2): 1 mg via INTRAVENOUS

## 2020-05-27 MED ORDER — IOHEXOL 350 MG/ML SOLN
INTRAVENOUS | Status: DC | PRN
  Administered 2020-05-27: 65 mL via INTRA_ARTERIAL

## 2020-05-27 MED ORDER — LABETALOL HCL 5 MG/ML IV SOLN: 10.0000 mg | INTRAVENOUS | Status: DC | PRN

## 2020-05-27 MED ORDER — FENTANYL CITRATE (PF) 100 MCG/2ML IJ SOLN
INTRAMUSCULAR | Status: DC | PRN
Start: 2020-05-27 — End: 2020-05-27
  Administered 2020-05-27 (×2): 25 ug via INTRAVENOUS

## 2020-05-27 MED ORDER — HEPARIN (PORCINE) IN NACL 1000-0.9 UT/500ML-% IV SOLN
INTRAVENOUS | Status: AC
  Filled 2020-05-27: qty 1000

## 2020-05-27 MED ORDER — ASPIRIN 81 MG PO CHEW: 81.0000 mg | CHEWABLE_TABLET | ORAL | Status: DC

## 2020-05-27 MED ORDER — LIDOCAINE HCL (PF) 1 % IJ SOLN
INTRAMUSCULAR | Status: AC
  Filled 2020-05-27: qty 30

## 2020-05-27 MED ORDER — VERAPAMIL HCL 2.5 MG/ML IV SOLN
INTRAVENOUS | Status: AC
  Filled 2020-05-27: qty 2

## 2020-05-27 MED ORDER — LIDOCAINE HCL (PF) 1 % IJ SOLN
INTRAMUSCULAR | Status: DC | PRN
  Administered 2020-05-27 (×2): 2 mL via INTRADERMAL
  Administered 2020-05-27: 30 mL via INTRADERMAL

## 2020-05-27 MED ORDER — SODIUM CHLORIDE 0.9 % WEIGHT BASED INFUSION
3.0000 mL/kg/h | INTRAVENOUS | Status: DC
  Administered 2020-05-27: 3 mL/kg/h via INTRAVENOUS

## 2020-05-27 MED ORDER — ACETAMINOPHEN 325 MG PO TABS: 650.0000 mg | ORAL_TABLET | ORAL | Status: DC | PRN

## 2020-05-27 MED ORDER — ACETAMINOPHEN 325 MG PO TABS
ORAL_TABLET | ORAL | Status: AC
  Administered 2020-05-27: 650 mg via ORAL
  Filled 2020-05-27: qty 2

## 2020-05-27 MED ORDER — FENTANYL CITRATE (PF) 100 MCG/2ML IJ SOLN
INTRAMUSCULAR | Status: AC
  Filled 2020-05-27: qty 2

## 2020-05-27 MED ORDER — SODIUM CHLORIDE 0.9% FLUSH: 3.0000 mL | INTRAVENOUS | Status: DC | PRN

## 2020-05-27 MED ORDER — ONDANSETRON HCL 4 MG/2ML IJ SOLN: 4.0000 mg | Freq: Four times a day (QID) | INTRAMUSCULAR | Status: DC | PRN

## 2020-05-27 MED ORDER — VERAPAMIL HCL 2.5 MG/ML IV SOLN
INTRAVENOUS | Status: DC | PRN
  Administered 2020-05-27: 10 mL via INTRA_ARTERIAL

## 2020-05-27 MED ORDER — HEPARIN SODIUM (PORCINE) 1000 UNIT/ML IJ SOLN
INTRAMUSCULAR | Status: AC
  Filled 2020-05-27: qty 1

## 2020-05-27 MED ORDER — MIDAZOLAM HCL 2 MG/2ML IJ SOLN
INTRAMUSCULAR | Status: AC
  Filled 2020-05-27: qty 2

## 2020-05-27 MED ORDER — SODIUM CHLORIDE 0.9 % IV SOLN: 250.0000 mL | INTRAVENOUS | Status: DC | PRN

## 2020-05-27 SURGICAL SUPPLY — 13 items
CATH 5FR JL3.5 JR4 ANG PIG MP (CATHETERS) ×1 IMPLANT
CATH BALLN WEDGE 5F 110CM (CATHETERS) ×1 IMPLANT
CATH INFINITI 5FR JL4 (CATHETERS) ×1 IMPLANT
DEVICE RAD COMP TR BAND LRG (VASCULAR PRODUCTS) ×1 IMPLANT
GLIDESHEATH SLEND SS 6F .021 (SHEATH) ×1 IMPLANT
GUIDEWIRE .025 260CM (WIRE) ×1 IMPLANT
KIT HEART LEFT (KITS) ×2 IMPLANT
PACK CARDIAC CATHETERIZATION (CUSTOM PROCEDURE TRAY) ×2 IMPLANT
SHEATH GLIDE SLENDER 4/5FR (SHEATH) ×1 IMPLANT
SHEATH PINNACLE 5F 10CM (SHEATH) ×1 IMPLANT
TRANSDUCER W/STOPCOCK (MISCELLANEOUS) ×2 IMPLANT
WIRE EMERALD 3MM-J .035X150CM (WIRE) ×1 IMPLANT
WIRE HI TORQ VERSACORE-J 145CM (WIRE) ×1 IMPLANT

## 2020-05-27 NOTE — Research (Signed)
[place] Informed Consent   Subject Name: [name]  Subject met inclusion and exclusion criteria.  The informed consent form, study requirements and expectations were reviewed with the subject and questions and concerns were addressed prior to the signing of the consent form.  The subject verbalized understanding of the trial requirements.  The subject agreed to participate in the [facility] trial and signed the informed consent at 1130 on 05/27/20.  The informed consent was obtained prior to performance of any protocol-specific procedures for the subject.  A copy of the signed informed consent was given to the subject and a copy was placed in the subject's medical record.   Basil Blakesley

## 2020-05-27 NOTE — Discharge Instructions (Signed)
Drink plenty of fluid for 48 hours and keep wrist elevated at heart level for 24 hours  Radial Site Care   This sheet gives you information about how to care for yourself after your procedure. Your health care provider may also give you more specific instructions. If you have problems or questions, contact your health care provider. What can I expect after the procedure? After the procedure, it is common to have:  Bruising and tenderness at the catheter insertion area. Follow these instructions at home: Medicines  Take over-the-counter and prescription medicines only as told by your health care provider. Insertion site care 1. Follow instructions from your health care provider about how to take care of your insertion site. Make sure you: ? Wash your hands with soap and water before you change your bandage (dressing). If soap and water are not available, use hand sanitizer. ? remove your dressing as told by your health care provider. In 24 hours 2. Check your insertion site every day for signs of infection. Check for: ? Redness, swelling, or pain. ? Fluid or blood. ? Pus or a bad smell. ? Warmth. 3. Do not take baths, swim, or use a hot tub until your health care provider approves. 4. You may shower 24-48 hours after the procedure, or as directed by your health care provider. ? Remove the dressing and gently wash the site with plain soap and water. ? Pat the area dry with a clean towel. ? Do not rub the site. That could cause bleeding. 5. Do not apply powder or lotion to the site. Activity   1. For 24 hours after the procedure, or as directed by your health care provider: ? Do not flex or bend the affected arm. ? Do not push or pull heavy objects with the affected arm. ? Do not drive yourself home from the hospital or clinic. You may drive 24 hours after the procedure unless your health care provider tells you not to. ? Do not operate machinery or power tools. 2. Do not lift  anything that is heavier than 10 lb (4.5 kg), or the limit that you are told, until your health care provider says that it is safe. For 4 days 3. Ask your health care provider when it is okay to: ? Return to work or school. ? Resume usual physical activities or sports. ? Resume sexual activity. General instructions  If the catheter site starts to bleed, raise your arm and put firm pressure on the site. If the bleeding does not stop, get help right away. This is a medical emergency.  If you went home on the same day as your procedure, a responsible adult should be with you for the first 24 hours after you arrive home.  Keep all follow-up visits as told by your health care provider. This is important. Contact a health care provider if:  You have a fever.  You have redness, swelling, or yellow drainage around your insertion site. Get help right away if:  You have unusual pain at the radial site.  The catheter insertion area swells very fast.  The insertion area is bleeding, and the bleeding does not stop when you hold steady pressure on the area.  Your arm or hand becomes pale, cool, tingly, or numb. These symptoms may represent a serious problem that is an emergency. Do not wait to see if the symptoms will go away. Get medical help right away. Call your local emergency services (911 in the [place] Do not   drive yourself to the hospital. Summary  After the procedure, it is common to have bruising and tenderness at the site.  Follow instructions from your health care provider about how to take care of your radial site wound. Check the wound every day for signs of infection.  Do not lift anything that is heavier than 10 lb (4.5 kg), or the limit that you are told, until your health care provider says that it is safe. This information is not intended to replace advice given to you by your health care provider. Make sure you discuss any questions you have with your health care  provider. Document Revised: 10/19/2017 Document Reviewed: 10/19/2017 Elsevier Patient Education  2020 [place].    Femoral Site Care This sheet gives you information about how to care for yourself after your procedure. Your health care provider may also give you more specific instructions. If you have problems or questions, contact your health care provider. What can I expect after the procedure? After the procedure, it is common to have:  Bruising that usually fades within 1-2 weeks.  Tenderness at the site. Follow these instructions at home: Wound care  Follow instructions from your health care provider about how to take care of your insertion site. Make sure you: ? Wash your hands with soap and water before you change your bandage (dressing). If soap and water are not available, use hand sanitizer. ? Change your dressing as told by your health care provider. ? Leave stitches (sutures), skin glue, or adhesive strips in place. These skin closures may need to stay in place for 2 weeks or longer. If adhesive strip edges start to loosen and curl up, you may trim the loose edges. Do not remove adhesive strips completely unless your health care provider tells you to do that.  Do not take baths, swim, or use a hot tub until your health care provider approves.  You may shower 24-48 hours after the procedure or as told by your health care provider. ? Gently wash the site with plain soap and water. ? Pat the area dry with a clean towel. ? Do not rub the site. This may cause bleeding.  Do not apply powder or lotion to the site. Keep the site clean and dry.  Check your femoral site every day for signs of infection. Check for: ? Redness, swelling, or pain. ? Fluid or blood. ? Warmth. ? Pus or a bad smell. Activity  For the first 2-3 days after your procedure, or as long as directed: ? Avoid climbing stairs as much as possible. ? Do not squat.  Do not lift anything that is heavier  than 10 lb (4.5 kg), or the limit that you are told, until your health care provider says that it is safe.  Rest as directed. ? Avoid sitting for a long time without moving. Get up to take short walks every 1-2 hours.  Do not drive for 24 hours if you were given a medicine to help you relax (sedative). General instructions  Take over-the-counter and prescription medicines only as told by your health care provider.  Keep all follow-up visits as told by your health care provider. This is important. Contact a health care provider if you have:  A fever or chills.  You have redness, swelling, or pain around your insertion site. Get help right away if:  The catheter insertion area swells very fast.  You pass out.  You suddenly start to sweat or your skin gets clammy.  The catheter insertion area is bleeding, and the bleeding does not stop when you hold steady pressure on the area.  The area near or just beyond the catheter insertion site becomes pale, cool, tingly, or numb. These symptoms may represent a serious problem that is an emergency. Do not wait to see if the symptoms will go away. Get medical help right away. Call your local emergency services (911 in the [place] Do not drive yourself to the hospital. Summary  After the procedure, it is common to have bruising that usually fades within 1-2 weeks.  Check your femoral site every day for signs of infection.  Do not lift anything that is heavier than 10 lb (4.5 kg), or the limit that you are told, until your health care provider says that it is safe. This information is not intended to replace advice given to you by your health care provider. Make sure you discuss any questions you have with your health care provider. Document Revised: 09/26/2017 Document Reviewed: 09/26/2017 Elsevier Patient Education  2020 [employer].

## 2020-05-27 NOTE — Progress Notes (Signed)
5 Fr arterial sheath discontinued, manual pressure held X 20 minutes.  Site soft, 4x4 tegederm dressing applied, pedal pulse intact.  Instructions reviewed about leg restrictions and holding pressure for any bleeding or hardness at site.  Verbalizesm understanding.

## 2020-05-27 NOTE — Interval H&P Note (Signed)
History and Physical Interval Note:  05/27/2020 12:50 PM  [name]  has presented today for surgery, with the diagnosis of sob.  The various methods of treatment have been discussed with the patient and family. After consideration of risks, benefits and other options for treatment, the patient has consented to  Procedure(s): RIGHT/LEFT HEART CATH AND CORONARY ANGIOGRAPHY (N/A) as a surgical intervention.  The patient's history has been reviewed, patient examined, no change in status, stable for surgery.  I have reviewed the patient's chart and labs.  Questions were answered to the patient's satisfaction.     Amye Grego [employer]

## 2020-05-28 ENCOUNTER — Encounter (HOSPITAL_COMMUNITY): Payer: Self-pay | Admitting: Cardiology

## 2020-06-03 ENCOUNTER — Ambulatory Visit: Payer: Medicare PPO | Admitting: Pulmonary Disease

## 2020-06-04 ENCOUNTER — Ambulatory Visit: Payer: Medicare PPO | Admitting: Pulmonary Disease

## 2020-06-04 ENCOUNTER — Other Ambulatory Visit: Payer: Self-pay

## 2020-06-04 ENCOUNTER — Encounter: Payer: Self-pay | Admitting: Pulmonary Disease

## 2020-06-04 VITALS — BP 114/60 | HR 96 | Temp 98.7°F | Wt 232.2 lb

## 2020-06-04 DIAGNOSIS — G4733 Obstructive sleep apnea (adult) (pediatric): Secondary | ICD-10-CM

## 2020-06-04 DIAGNOSIS — J849 Interstitial pulmonary disease, unspecified: Secondary | ICD-10-CM | POA: Diagnosis not present

## 2020-06-04 DIAGNOSIS — J42 Unspecified chronic bronchitis: Secondary | ICD-10-CM | POA: Diagnosis not present

## 2020-06-04 DIAGNOSIS — Z9989 Dependence on other enabling machines and devices: Secondary | ICD-10-CM

## 2020-06-04 LAB — COMPREHENSIVE METABOLIC PANEL
ALT: 24 U/L (ref 0–53)
AST: 24 U/L (ref 0–37)
Albumin: 4 g/dL (ref 3.5–5.2)
Alkaline Phosphatase: 48 U/L (ref 39–117)
BUN: 17 mg/dL (ref 6–23)
CO2: 35 mEq/L — ABNORMAL HIGH (ref 19–32)
Calcium: 9.5 mg/dL (ref 8.4–10.5)
Chloride: 97 mEq/L (ref 96–112)
Creatinine, Ser: 0.82 mg/dL (ref 0.40–1.50)
GFR: 90.88 mL/min (ref >60.00)
Glucose, Bld: 95 mg/dL (ref 70–99)
Potassium: 4.1 mEq/L (ref 3.5–5.1)
Sodium: 138 mEq/L (ref 135–145)
Total Bilirubin: 1 mg/dL (ref 0.2–1.2)
Total Protein: 6.6 g/dL (ref 6.0–8.3)

## 2020-06-04 NOTE — Assessment & Plan Note (Signed)
Plan: Continue CPAP therapy 

## 2020-06-04 NOTE — Assessment & Plan Note (Signed)
Plan: Continue Breo Ellipta 100 Continue Spiriva Respimat 2.5

## 2020-06-04 NOTE — Patient Instructions (Addendum)
You were seen today by Lauraine Rinne, NP  for:   1. ILD (interstitial lung disease) ([facility])  - Pulmonary function test; Future  Labwork today   Complete a high-resolution CT chest in November/2021  We will order a 6-minute walk to be completed in November/2021 with follow-up with Dr. Vaughan Browner  We will complete a pulmonary function test-spirometry with DLCO 30-minute breathing test in January/2022  Continue Ofev  We will investigate whether or not we can refer you to a pulmonary rehab facility in [place], [place]  2. Chronic bronchitis, unspecified chronic bronchitis type (HCC)  Breo Ellipta 100 >>> Take 1 puff daily in the morning right when you wake up >>>Rinse your mouth out after use >>>This is a daily maintenance inhaler, NOT a rescue inhaler >>>Contact our office if you are having difficulties affording or obtaining this medication >>>It is important for you to be able to take this daily and not miss any doses   Spiriva Respimat 2.5 >>> 2 puffs daily >>> Do this every day >>>This is not a rescue inhaler  Note your daily symptoms > remember "red flags" for COPD:   >>>Increase in cough >>>increase in sputum production >>>increase in shortness of breath or activity  intolerance.   If you notice these symptoms, please call the office to be seen.    3. OSA on CPAP  We recommend that you continue using your CPAP daily >>>Keep up the hard work using your device >>> Goal should be wearing this for the entire night that you are sleeping, at least 4 to 6 hours  Remember:  . Do not drive or operate heavy machinery if tired or drowsy.  . Please notify the supply company and office if you are unable to use your device regularly due to missing supplies or machine being broken.  . Work on maintaining a healthy weight and following your recommended nutrition plan  . Maintain proper daily exercise and movement  . Maintaining proper use of your device can also help improve  management of other chronic illnesses such as: Blood pressure, blood sugars, and weight management.   BiPAP/ CPAP Cleaning:  >>>Clean weekly, with Dawn soap, and bottle brush.  Set up to air dry. >>> Wipe mask out daily with wet wipe or towelette     We recommend today:  Orders Placed This Encounter  Procedures  . Pulmonary function test    Arlyce Harman DLCO - 31mn breathing test    Standing Status:   Future    Standing Expiration Date:   06/04/2021    Order Specific Question:   Where should this test be performed?    Answer:   [facility]   Orders Placed This Encounter  Procedures  . Pulmonary function test   No orders of the defined types were placed in this encounter.   Follow Up:    Return in about 10 weeks (around 08/13/2020), or if symptoms worsen or fail to improve, for Follow up with Dr. MVaughan Browner After Chest CT. Schedule 6 min walk in November/2021  Notification of test results are managed in the following manner: If there are  any recommendations or changes to the  plan of care discussed in office today,  we will contact you and let you know what they are. If you do not hear from [place] then your results are normal and you can view them through your  MyChart account , or a letter will be sent to you. Thank you again for trusting  [place] with your care  - Thank you, [facility]    It is flu season:   >>> Best ways to protect herself from the flu: Receive the yearly flu vaccine, practice good hand hygiene washing with soap and also using hand sanitizer when available, eat a nutritious meals, get adequate rest, hydrate appropriately       Please contact the office if your symptoms worsen or you have concerns that you are not improving.   Thank you for choosing [facility] for your healthcare, and for allowing [place] to partner with you on your healthcare journey. I am thankful to be able to provide care to you today.   [name] FNP-C

## 2020-06-04 NOTE — Progress Notes (Signed)
_0  ID: [name], male    DOB: 09-Jul-1941, 79 y.o.   MRN: [id number]  Chief Complaint  Patient presents with  . Follow-up    1 month follow up. breathing is not any better but seems about the same. family stated that he has to use the concentrator more often during the day.     Referring provider: Javier Glazier, MD  HPI:  79 year old male former smoker referred to our office in 2018.  Managed in our office for COPD, interstitial lung disease, and obstructive sleep apnea (managed on CPAP).   PMH: Rheumatoid arthritis (currently on methotrexate)-followed by Dr. Trudie Reed Smoker/ Smoking History: Former Smoker, Quit 2012, 81 pack years Maintenance:  Breo Ellipta 100, Spiriva 2.5 Pt of: Dr. Vaughan Browner  06/04/2020  - Visit   79 year old male former smoker followed in our office for COPD, interstitial lung disease and severe obstructive sleep apnea.  He is followed by Dr. Vaughan Browner.  He is completing a 1 month follow-up with our office today.  He was last seen on 04/30/2020 by Dr. Vaughan Browner.  Plan of care at that point in time was for the patient to obtain a right heart catheterization as well as a high-resolution CT chest in 3 months and obtain lab work.  There were notes in the chart for the patient to start Ofev and this is due to decline of FVC.  Patient is status post heart cath.  Those results are listed below:   05/27/20-RHC  RHC Procedural Findings: Hemodynamics (mmHg) RA mean 4 RV 42/7 PA 44/11, mean 29 PCWP mean 11  Oxygen saturations: PA 70% AO 98%  Cardiac Output (Fick) 5.55  Cardiac Index (Fick) 2.53 PVR 3.2 WU  1. Normal filling pressures.   2. Mild pulmonary hypertension.  3. Preserved cardiac output.  4. 50-60% (moderate) mid LAD stenosis.   Mild pulmonary hypertension with PVR only 3.2 WU.  I do not think that the patient would benefit appreciably from Tyvaso for ILD-related pulmonary hypertension.   50-60% mid LAD stenosis with good flow down the  vessel.  Patient reports significant dyspnea but no chest pain.  I suspect that his dyspnea is related his lung disease.  His EF is normal.  I would favor medical management (statin, aspirin, etc).  If there is a question of angina in the future, could consider functional study (Cardiolite) to assess for ischemia (but suspect not hemodynamically significant).    Patient and spouse reporting today.  Reporting patient has had more symptoms with physical exertion.  He is having uses oxygen consistently with physical exertion.  When walking long distances patient uses a motorized scooter.  He admits that he has been more sedentary as of late.  He would like to review in more detail pulmonary function testing and discuss those results.  We will review and discuss this today.  Overall patient feels that breathing is about the same from the August/2021 office visit.    Patient continues to utilize his CPAP.  CPAP compliance report shows excellent compliance see compliance were listed below:  05/04/2020-06/02/2020-CPAP compliance report-30 had a last 30 days use, all 30 those days greater than 4 hours, average usage 12 hours and 39 minutes, CPAP set pressure of 8, AHI 1.4  Questionaires / Pulmonary Flowsheets:   ACT:  No flowsheet data found.  MMRC: mMRC Dyspnea Scale mMRC Score  06/04/2020 3    Epworth:  No flowsheet data found.  Tests:   Data Reviewed: Imaging: High-resolution CT 06/29/2018- Moderate  emphysema. Calcified granulomas.  Subcentimeter pulmonary nodule.  Subpleural reticulation, mild traction bronchiectasis with basal gradient.  Right greater than left.  No honeycombing.  Slightly worse compared to previous CT scans. Probable UIP.  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  No significant change compared to 2019.  Enlarged PA.  Stable pulmonary nodule  PFTs: 05/14/2019 FVC 2.32 [62%], FEV1 1.54 [58%], F/F 66, TLC 4.47 [69%], DLCO 13.34  [58%]  04/09/2020 FVC 1.73 (47%), FEV1 1.02 [39%], F/F 59, DLCO 13.71 [60%] Severe obstruction, moderate diffusion defect  Labs: CBC 04/06/2019-WBC 10.1, eos 0.8%, absolute eosinophil count 81   Sleep PSG 04/25/2000.  Severe sleep apnea, AHI 78/hr.  05/27/20-RHC  RHC Procedural Findings: Hemodynamics (mmHg) RA mean 4 RV 42/7 PA 44/11, mean 29 PCWP mean 11  Oxygen saturations: PA 70% AO 98%  Cardiac Output (Fick) 5.55  Cardiac Index (Fick) 2.53 PVR 3.2 WU  1. Normal filling pressures.   2. Mild pulmonary hypertension.  3. Preserved cardiac output.  4. 50-60% (moderate) mid LAD stenosis.   Mild pulmonary hypertension with PVR only 3.2 WU.  I do not think that the patient would benefit appreciably from Tyvaso for ILD-related pulmonary hypertension.   50-60% mid LAD stenosis with good flow down the vessel.  Patient reports significant dyspnea but no chest pain.  I suspect that his dyspnea is related his lung disease.  His EF is normal.  I would favor medical management (statin, aspirin, etc).  If there is a question of angina in the future, could consider functional study (Cardiolite) to assess for ischemia (but suspect not hemodynamically significant).    FENO:  No results found for: NITRICOXIDE  PFT: PFT Results Latest Ref Rng & Units 04/09/2020 11/09/2019 05/14/2019 07/04/2018  FVC-Pre L 1.73 2.16 2.40 2.64  FVC-Predicted Pre % 47 58 64 71  FVC-Post L - 2.34 2.32 2.59  FVC-Predicted Post % - 64 62 70  Pre FEV1/FVC % % 59 61 66 71  Post FEV1/FCV % % - 65 66 72  FEV1-Pre L 1.02 1.31 1.57 1.87  FEV1-Predicted Pre % 39 49 59 70  FEV1-Post L - 1.52 1.54 1.86  DLCO uncorrected ml/min/mmHg 13.71 14.23 13.34 14.88  DLCO UNC% % 60 62 58 52  DLCO corrected ml/min/mmHg 13.71 14.53 13.83 15.33  DLCO COR %Predicted % 60 64 60 54  DLVA Predicted % 98 92 84 78  TLC L - 4.15 4.47 5.26  TLC % Predicted % - 64 69 81  RV % Predicted % - 76 89 109    WALK:  SIX MIN WALK 05/14/2019  02/22/2019 11/17/2016  Supplimental Oxygen during Test? (L/min) No No No  Tech Comments: The patient had to stop multiple times during his walk. He maintained the same pace, but had to be put on 4L O2 pulse and able to keep O2 at 92%. walk with pt at slow pace, pt did experience SOB and had to stop multiple times. O2 sat dropped to 86 but quickly recovered with rest. Walk stopped early due to elevevated HR of 154 and c/o slight dizziness. pt walked a moderate pace, did not complete lap 3 d/t back pain.     Imaging: CARDIAC CATHETERIZATION  Result Date: 05/27/2020 1. Normal filling pressures.  2. Mild pulmonary hypertension. 3. Preserved cardiac output. 4. 50-60% (moderate) mid LAD stenosis. Mild pulmonary hypertension with PVR only 3.2 WU.  I do not think that the patient would benefit appreciably from Tyvaso for ILD-related pulmonary hypertension. 50-60% mid  LAD stenosis with good flow down the vessel.  Patient reports significant dyspnea but no chest pain.  I suspect that his dyspnea is related his lung disease.  His EF is normal.  I would favor medical management (statin, aspirin, etc).  If there is a question of angina in the future, could consider functional study (Cardiolite) to assess for ischemia (but suspect not hemodynamically significant).   ECHOCARDIOGRAM COMPLETE  Result Date: 05/15/2020    ECHOCARDIOGRAM REPORT   Patient Name:   [name] Date of Exam: 05/15/2020 Medical Rec #:  [id number]        Height:       67.0 in Accession #:    [id number]       Weight:       243.6 lb Date of Birth:  1940-10-03        BSA:          2.199 m Patient Age:    34 years         BP:           126/74 mmHg Patient Gender: M                HR:           77 bpm. Exam Location:  Outpatient Procedure: 2D Echo Indications:    Dypnea R06.00  History:        Patient has prior history of Echocardiogram examinations, most                 recent 07/13/2019. COPD; Risk Factors:Hypertension.  Sonographer:    Mikki Santee RDCS (AE) Referring Phys: [place]  1. Technically difficult study. Left ventricular ejection fraction, by estimation, is 60 to 65%. The left ventricle has grossly normal function. Left ventricular endocardial border not optimally defined to evaluate regional wall motion. There is mild left ventricular hypertrophy. Left ventricular diastolic parameters are indeterminate.  2. Right ventricular systolic function is mildly reduced. The right ventricular size is normal. There is normal pulmonary artery systolic pressure. The estimated right ventricular systolic pressure is 03.5 mmHg.  3. Left atrial size was mildly dilated.  4. The mitral valve is abnormal. Moderate mitral annular calcification. No evidence of mitral valve regurgitation.  5. The aortic valve is abnormal. Moderately calcified. Aortic valve regurgitation is not visualized. Mild to moderate aortic valve sclerosis/calcification is present, without any evidence of aortic stenosis.  6. Aortic dilatation noted. There is mild dilatation of the aortic root measuring 40 mm.  7. The inferior vena cava is normal in size with greater than 50% respiratory variability, suggesting right atrial pressure of 3 mmHg. FINDINGS  Left Ventricle: Left ventricular ejection fraction, by estimation, is 60 to 65%. The left ventricle has normal function. Left ventricular endocardial border not optimally defined to evaluate regional wall motion. The left ventricular internal cavity size was normal in size. There is mild left ventricular hypertrophy. Left ventricular diastolic parameters are indeterminate. Right Ventricle: The right ventricular size is normal. Right vetricular wall thickness was not assessed. Right ventricular systolic function is mildly reduced. There is normal pulmonary artery systolic pressure. The tricuspid regurgitant velocity is 2.68  m/s, and with an assumed right atrial pressure of 3 mmHg, the estimated right ventricular  systolic pressure is 59.7 mmHg. Left Atrium: Left atrial size was mildly dilated. Right Atrium: Right atrial size was normal in size. Pericardium: Trivial pericardial effusion is present. Mitral Valve: The mitral valve is abnormal. Moderate mitral annular calcification.  No evidence of mitral valve regurgitation. Tricuspid Valve: The tricuspid valve is normal in structure. Tricuspid valve regurgitation is trivial. Aortic Valve: The aortic valve is abnormal. Aortic valve regurgitation is not visualized. Mild to moderate aortic valve sclerosis/calcification is present, without any evidence of aortic stenosis. There is moderate calcification of the aortic valve. Pulmonic Valve: The pulmonic valve was not well visualized. Pulmonic valve regurgitation is not visualized. Aorta: Aortic dilatation noted. There is mild dilatation of the aortic root measuring 40 mm. Venous: The inferior vena cava is normal in size with greater than 50% respiratory variability, suggesting right atrial pressure of 3 mmHg. IAS/Shunts: The interatrial septum was not well visualized.  LEFT VENTRICLE PLAX 2D LVIDd:         4.30 cm  Diastology LVIDs:         2.70 cm  LV e' lateral:   9.70 cm/s LV PW:         1.00 cm  LV E/e' lateral: 9.6 LV IVS:        1.10 cm  LV e' medial:    8.76 cm/s LVOT diam:     2.20 cm  LV E/e' medial:  10.7 LV SV:         68 LV SV Index:   31 LVOT Area:     3.80 cm  RIGHT VENTRICLE RV S prime:     11.60 cm/s TAPSE (M-mode): 1.2 cm LEFT ATRIUM           Index       RIGHT ATRIUM           Index LA diam:      2.70 cm 1.23 cm/m  RA Area:     21.80 cm LA Vol (A2C): 64.7 ml 29.42 ml/m RA Volume:   49.20 ml  22.37 ml/m LA Vol (A4C): 88.9 ml 40.43 ml/m  AORTIC VALVE LVOT Vmax:   78.30 cm/s LVOT Vmean:  49.100 cm/s LVOT VTI:    0.179 m  AORTA Ao Root diam: 3.90 cm MITRAL VALVE                TRICUSPID VALVE MV Area (PHT): 2.26 cm     TR Peak grad:   28.7 mmHg MV Decel Time: 335 msec     TR Vmax:        268.00 cm/s MV E  velocity: 93.40 cm/s MV A velocity: 119.00 cm/s  SHUNTS MV E/A ratio:  0.78         Systemic VTI:  0.18 m                             Systemic Diam: 2.20 cm Oswaldo Milian MD Electronically signed by Oswaldo Milian MD Signature Date/Time: 05/15/2020/8:48:12 PM    Final     Lab Results:  CBC    Component Value Date/Time   WBC 10.3 05/12/2020 0947   WBC 8.6 12/21/2019 1204   RBC 3.79 (L) 05/12/2020 0947   RBC 4.12 (L) 12/21/2019 1204   HGB 13.6 05/27/2020 1306   HGB 13.8 05/12/2020 0947   HCT 40.0 05/27/2020 1306   HCT 41.1 05/12/2020 0947   PLT 217 05/12/2020 0947   MCV 108 (H) 05/12/2020 0947   MCH 36.4 (H) 05/12/2020 0947   MCH 35.0 (H) 12/21/2019 1204   MCHC 33.6 05/12/2020 0947   MCHC 33.7 12/21/2019 1204   RDW 13.2 05/12/2020 0947   LYMPHSABS 1.5 05/12/2020 0947   MONOABS  1.6 (H) 10/30/2019 1143   EOSABS 0.2 05/12/2020 0947   BASOSABS 0.0 05/12/2020 0947    BMET    Component Value Date/Time   NA 141 05/27/2020 1306   NA 138 05/12/2020 0947   K 3.8 05/27/2020 1306   CL 96 05/12/2020 0947   CO2 30 (H) 05/12/2020 0947   GLUCOSE 119 (H) 05/12/2020 0947   GLUCOSE 112 (H) 04/30/2020 1411   BUN 13 05/12/2020 0947   CREATININE 0.95 05/12/2020 0947   CREATININE 0.93 08/27/2019 1132   CALCIUM 9.3 05/12/2020 0947   GFRNONAA 77 05/12/2020 0947   GFRNONAA 78 02/21/2019 1125   GFRAA 89 05/12/2020 0947   GFRAA 91 02/21/2019 1125    BNP    Component Value Date/Time   BNP 120.0 (H) 07/13/2018 1858    ProBNP    Component Value Date/Time   PROBNP 213 04/30/2020 1411   PROBNP 146.0 (H) 10/30/2019 1143    Specialty Problems      Pulmonary Problems   GOLD COPD II B           OSA on CPAP    >>>N. PSG on 04/25/2000 gave an index of 78/hr.  Ever since then he has  been using CPAP at 8 C. 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 Sulfa Antibiotics Shortness Of Breath  . Sulfamethoxazole-Trimethoprim Shortness Of Breath  . Sulfasalazine Shortness Of Breath  . Other Other (See Comments)  . Varenicline Other (See Comments)     Strange thoughts SUICIDAL  . Lisinopril Cough    Immunization History  Administered Date(s) Administered  . 19-influenza Whole 07/18/2012, 06/14/2014, 08/12/2015, 06/03/2016  . HiB (PRP-T) 08/23/2017  . Influenza, High Dose Seasonal PF 06/12/2018, 07/06/2019  . Influenza,inj,Quad PF,6+ Mos 06/28/2017  . Influenza-Unspecified 07/18/2012, 06/14/2014, 08/12/2015, 06/03/2016, 06/28/2017  . Meningococcal B, OMV 08/23/2017, 09/21/2017  . Meningococcal Conjugate 08/23/2017  . Meningococcal Mcv4o 08/23/2017, 10/18/2017  . Moderna SARS-COVID-2 Vaccination 10/11/2019, 11/06/2019  . Pneumococcal Conjugate-13 11/28/2013, 11/28/2013  . Pneumococcal Polysaccharide-23 12/26/2005, 09/13/2012  . Pneumococcal-Unspecified 12/26/2005, 09/13/2012  . Tdap 11/28/2013, 11/28/2013  . Zoster 06/06/2012, 06/06/2012    Past Medical History:  Diagnosis Date  . Chronic lower back pain   . Complication of anesthesia 01/2017   was in ICU due to breathing complications-  . COPD (chronic obstructive pulmonary disease) ([place])   . Degenerative scoliosis in adult patient   . Depression   . Dyspnea    on exertion  .  GERD (gastroesophageal reflux disease)   . H/O seasonal allergies   . High cholesterol   . 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 History of kidney stones   . Hypertension   . 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 OSA on CPAP    wears CPAP  . Pneumonia    "I've had it several times; last time was end of Jan 2018" (05/02/2017)  . Pre-diabetes   . Prostate cancer ([place])   . Rheumatoid arteritis ([place])    "all over" (05/02/2017)  . Thrush 12/07/2017  . Wears glasses     Tobacco History: Social History   Tobacco Use  Smoking Status Former Smoker  . Packs/day: 1.50  . Years: 54.00  . Pack years: 81.00  . Types: Cigarettes  . Start date: 02/21/1961  . Quit date: 09/27/2014  . Years since quitting: 5.6  Smokeless Tobacco Never Used  Tobacco Comment   05/02/2017 "quit vaping 09/2016"   Counseling given: Not Answered Comment: 05/02/2017 "quit vaping 09/2016"   Continue to not smoke  Outpatient Encounter Medications as of 06/04/2020  Medication Sig  . albuterol (PROVENTIL HFA;VENTOLIN HFA) 108 (90 Base) MCG/ACT inhaler Inhale 2 puffs into the lungs every 6 (six) hours as needed for wheezing or shortness of breath.  Marland Kitchen BREO ELLIPTA 100-25 MCG/INH AEPB INHALE 1 PUFF INTO LUNGS DAILY (Patient taking differently: Inhale 1 puff into the lungs daily. )  . cetirizine (ZYRTEC) 10 MG tablet Take 10 mg by mouth daily as needed for allergies.   . DULoxetine (CYMBALTA) 60 MG capsule Take 60 mg by mouth every evening.   . finasteride (PROSCAR) 5 MG tablet Take 5 mg by mouth at bedtime.   . fluticasone (FLONASE) 50 MCG/ACT nasal spray Place 2 sprays into both nostrils daily as needed for allergies.   . folic acid (FOLVITE) 1 MG tablet Take 1 mg by mouth daily.  . furosemide (LASIX) 20 MG tablet Take 1 tablet (24m total) daily for next 7 days for fluid management (Patient taking differently: Take 20 mg by mouth daily. )  . gabapentin (NEURONTIN)  300 MG capsule Take 300-800 mg by mouth See admin instructions. Take 300 mg in the morning 600 mg in the afternoon and 600-800 at bedtime  . ibuprofen (ADVIL) 200 MG tablet Take 600-800 mg by mouth daily as needed for mild pain or moderate pain.   .Marland Kitchenlosartan (COZAAR) 25 MG tablet TAKE ONE (1) TABLET BY MOUTH EACH DAY (Patient taking differently: Take 25 mg by mouth daily. )  . metFORMIN (GLUCOPHAGE) 500 MG tablet Take 500 mg by mouth daily.   . methotrexate (RHEUMATREX) 2.5 MG tablet Take 2.5 mg by mouth once a week. Caution:Chemotherapy. Protect from light.  . metoprolol succinate (TOPROL-XL) 50 MG 24 hr tablet Take 50 mg by mouth 2 (two) times daily.  . Nintedanib (OFEV) 150 MG CAPS Take 1 capsule (150 mg total) by mouth 2 (two) times daily.  . predniSONE (DELTASONE) 5 MG tablet Take 5 mg by mouth daily.   . simvastatin (ZOCOR) 40 MG tablet Take 40 mg by mouth at bedtime.   . sodium chloride (OCEAN) 0.65 % SOLN nasal spray Place 1 spray into both nostrils daily as needed for congestion.   .Marland KitchenSPIRIVA RESPIMAT 2.5 MCG/ACT AERS INHALE TWO (2) PUFFS INTO THE LUNGS ONCE DAILY (Patient taking differently: Take 2 puffs by mouth daily. )  . tamsulosin (FLOMAX) 0.4 MG CAPS capsule Take 0.4 mg by mouth at bedtime.   . traMADol (ULTRAM) 50 MG tablet Take 50-100 mg by mouth every 6 (six) hours as needed for moderate pain or severe pain.   . traZODone (DESYREL) 50 MG tablet Take 50 mg  by mouth at bedtime.   No facility-administered encounter medications on file as of 06/04/2020.     Review of Systems  Review of Systems  Constitutional: Positive for fatigue. Negative for activity change, chills, fever and unexpected weight change.  HENT: Negative for rhinorrhea, sinus pressure, sinus pain and sore throat.   Eyes: Negative.   Respiratory: Positive for shortness of breath. Negative for cough and wheezing.   Cardiovascular: Negative for chest pain and palpitations.  Gastrointestinal: Negative for  constipation, diarrhea, nausea and vomiting.  Endocrine: Negative.   Genitourinary: Negative.   Musculoskeletal: Negative.   Skin: Negative.   Neurological: Negative for dizziness and headaches.  Psychiatric/Behavioral: Negative.  Negative for dysphoric mood. The patient is not nervous/anxious.   All other systems reviewed and are negative.    Physical Exam  BP 114/60   Pulse 96   Temp 98.7 F (37.1 C) (Oral)   Wt 232 lb 4 oz (105.3 kg)   SpO2 94%   BMI 36.38 kg/m   Wt Readings from Last 5 Encounters:  06/04/20 232 lb 4 oz (105.3 kg)  05/27/20 243 lb (110.2 kg)  05/12/20 243 lb 9.6 oz (110.5 kg)  04/30/20 244 lb 9.6 oz (110.9 kg)  03/21/20 243 lb (110.2 kg)    BMI Readings from Last 5 Encounters:  06/04/20 36.38 kg/m  05/27/20 38.06 kg/m  05/12/20 38.15 kg/m  04/30/20 38.31 kg/m  03/21/20 38.06 kg/m     Physical Exam Vitals and nursing note reviewed.  Constitutional:      General: He is not in acute distress.    Appearance: Normal appearance. He is obese.  HENT:     Head: Normocephalic and atraumatic.     Right Ear: Hearing and external ear normal.     Left Ear: Hearing and external ear normal.     Nose: No mucosal edema.     Right Turbinates: Not enlarged.     Left Turbinates: Not enlarged.     Comments: Deferred due to masking requirement    Mouth/Throat:     Comments: Deferred due to masking requirement Eyes:     Pupils: Pupils are equal, round, and reactive to light.  Cardiovascular:     Rate and Rhythm: Normal rate and regular rhythm.     Pulses: Normal pulses.     Heart sounds: Normal heart sounds. No murmur heard.   Pulmonary:     Effort: Pulmonary effort is normal.     Breath sounds: Rales (Bibasilar Rales) present. No decreased breath sounds or wheezing.  Musculoskeletal:     Cervical back: Normal range of motion.     Right lower leg: No edema.     Left lower leg: No edema.  Lymphadenopathy:     Cervical: No cervical adenopathy.   Skin:    General: Skin is warm and dry.     Capillary Refill: Capillary refill takes less than 2 seconds.     Findings: No erythema or rash.  Neurological:     General: No focal deficit present.     Mental Status: He is alert and oriented to person, place, and time.     Motor: No weakness.     Coordination: Coordination normal.     Gait: Gait is intact. Gait normal.  Psychiatric:        Mood and Affect: Mood normal.        Behavior: Behavior normal. Behavior is cooperative.        Thought Content: Thought content normal.  Judgment: Judgment normal.       Assessment & Plan:   Discussion: Discussed at length with patient and spouse recent pulmonary function testing as well as trending of patient's FVC which is declining.  Agree with remaining on Ofev.  Have also discussed how we clinically monitor this as well as physically monitor.  All questions were answered.  OSA on CPAP Plan: Continue CPAP therapy  ILD (interstitial lung disease) ([place]) Discussed and reviewed recent pulmonary function testing Reviewed previous MyChart messages between Dr. Vaughan Browner and patient Reviewed right heart cath results, agree that patient would likely not benefit from Tyvaso  Plan: Continue 2 L of O2 with physical exertion Continue CPAP therapy with 3 L bled in Continue methotrexate and prednisone as managed by rheumatology Continue Ofev Lab work today We would recommend referring you to pulmonary rehab, we will investigate if you are able to coordinate this in [place], [place] We will plan on high-resolution CT chest in November/2021 We will plan on a spirometry with DLCO 30-minute breathing test in January/2022 Follow-up with Dr. Vaughan Browner with a 6-minute walk in November/2021  GOLD COPD II B Plan: Continue Breo Ellipta 100 Continue Spiriva Respimat 2.5    Return in about 10 weeks (around 08/13/2020), or if symptoms worsen or fail to improve, for Follow up with Dr. Vaughan Browner,  After Chest CT.   Lauraine Rinne, NP 06/04/2020   This appointment required 55 minutes of patient care (this includes precharting, chart review, review of results, face-to-face care, etc.).

## 2020-06-04 NOTE — Telephone Encounter (Signed)
[name] please see patient mychart message  [name], I don't think you folks know I have resumed Remicade infusions every 8 weeks.  I had been on the biologic for many years (decided cost to [facility] was high and felt guilty) and stopped.  Pain several months ago increased in numerous joints so RA Doc agreed to start again. Pain is reducing on key joints as we progress. Willaim Sheng

## 2020-06-04 NOTE — Assessment & Plan Note (Addendum)
Discussed and reviewed recent pulmonary function testing Reviewed previous MyChart messages between Dr. Vaughan Browner and patient Reviewed right heart cath results, agree that patient would likely not benefit from Tyvaso  Plan: Continue 2 L of O2 with physical exertion Continue CPAP therapy with 3 L bled in Continue methotrexate and prednisone as managed by rheumatology Continue Ofev Lab work today We would recommend referring you to pulmonary rehab, we will investigate if you are able to coordinate this in [place], [place] We will plan on high-resolution CT chest in November/2021 We will plan on a spirometry with DLCO 30-minute breathing test in January/2022 Follow-up with Dr. Vaughan Browner with a 6-minute walk in November/2021

## 2020-06-05 NOTE — Telephone Encounter (Signed)
Know we did not know that.  Thank you for letting us know.  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  I will CC Dr. Vaughan Browner as an update to him as well.  Thank you for letting us now.  [name], FNP

## 2020-06-06 ENCOUNTER — Encounter: Payer: Self-pay | Admitting: *Deleted

## 2020-06-20 NOTE — Telephone Encounter (Signed)
Hey, pt is asking if possible to attend pulmonary rehab in [place]- are they doing rehab right now? He asked about [facility]?

## 2020-06-20 NOTE — Telephone Encounter (Signed)
I seen [name]'s note My question is if they are doing pulmonary rehab at these locations right now or not, if so I will place a referral

## 2020-06-23 NOTE — Telephone Encounter (Signed)
[facility] team, please see notes. Patient would like to schedule pulmonary rehab at [facility].

## 2020-06-23 NOTE — Telephone Encounter (Signed)
I called [facility] and they are not scheduling pulmonary rehab right now.  I called [facility] and they are scheduling.

## 2020-06-24 ENCOUNTER — Other Ambulatory Visit: Payer: Self-pay | Admitting: Pulmonary Disease

## 2020-06-24 DIAGNOSIS — J849 Interstitial pulmonary disease, unspecified: Secondary | ICD-10-CM

## 2020-06-24 NOTE — Telephone Encounter (Signed)
[facility] is doing pulmonary rehab now.  Order will need to be placed.

## 2020-06-24 NOTE — Telephone Encounter (Signed)
Based off the stated documentation patient is not able to obtain pulmonary rehab in [place], [place].  If the patient would prefer to establish with [facility] for pulmonary rehab then we can easily coordinate this.  Okay to place order for pulmonary rehab within [facility] health facility under Dr. Matilde Bash name for interstitial lung disease.  [name], FNP

## 2020-06-24 NOTE — Telephone Encounter (Signed)
Patient had reached out for pulmonary rehab update from [facility].   Message routed to [name], NP to advise on rehab order.   Note from [facility] is doing pulmonary rehab now.  Order will need to be placed. [facility] is not scheduling pulmonary rehab.

## 2020-06-25 ENCOUNTER — Other Ambulatory Visit: Payer: Self-pay | Admitting: Pharmacist

## 2020-06-25 DIAGNOSIS — J849 Interstitial pulmonary disease, unspecified: Secondary | ICD-10-CM

## 2020-06-27 ENCOUNTER — Encounter (HOSPITAL_COMMUNITY): Payer: Self-pay | Admitting: *Deleted

## 2020-06-27 NOTE — Progress Notes (Signed)
Received referral from Dr. Vaughan Browner for this pt to participate in pulmonary rehab with the the diagnosis of ILD here at [facility] as [facility] has not restarted their pulmonary rehab program. Clinical review of pt follow up appt on 9/8 Pulmonary office note.  Pt with Covid Risk Score - 8. Pt appropriate for scheduling for Pulmonary rehab.  Will forward to support staff for scheduling and verification of insurance eligibility/benefits with pt consent. Cherre Huger, BSN Cardiac and Training and development officer

## 2020-07-01 ENCOUNTER — Ambulatory Visit (INDEPENDENT_AMBULATORY_CARE_PROVIDER_SITE_OTHER): Payer: Medicare PPO

## 2020-07-01 ENCOUNTER — Other Ambulatory Visit: Payer: Self-pay

## 2020-07-01 ENCOUNTER — Encounter: Payer: Self-pay | Admitting: Pulmonary Disease

## 2020-07-01 ENCOUNTER — Ambulatory Visit: Payer: Medicare PPO | Admitting: Pulmonary Disease

## 2020-07-01 VITALS — BP 116/76 | HR 121 | Temp 97.5°F | Ht 67.0 in | Wt 242.0 lb

## 2020-07-01 DIAGNOSIS — J849 Interstitial pulmonary disease, unspecified: Secondary | ICD-10-CM

## 2020-07-01 DIAGNOSIS — G4733 Obstructive sleep apnea (adult) (pediatric): Secondary | ICD-10-CM | POA: Diagnosis not present

## 2020-07-01 DIAGNOSIS — Z9989 Dependence on other enabling machines and devices: Secondary | ICD-10-CM

## 2020-07-01 DIAGNOSIS — Z5181 Encounter for therapeutic drug level monitoring: Secondary | ICD-10-CM | POA: Diagnosis not present

## 2020-07-01 DIAGNOSIS — M0579 Rheumatoid arthritis with rheumatoid factor of multiple sites without organ or systems involvement: Secondary | ICD-10-CM | POA: Diagnosis not present

## 2020-07-01 LAB — HEPATIC FUNCTION PANEL
ALT: 20 U/L (ref 0–53)
AST: 23 U/L (ref 0–37)
Albumin: 3.9 g/dL (ref 3.5–5.2)
Alkaline Phosphatase: 46 U/L (ref 39–117)
Bilirubin, Direct: 0.2 mg/dL (ref 0.0–0.3)
Total Bilirubin: 0.9 mg/dL (ref 0.2–1.2)
Total Protein: 6.5 g/dL (ref 6.0–8.3)

## 2020-07-01 NOTE — Addendum Note (Signed)
Addended by: Suzzanne Cloud E on: 07/01/2020 12:01 PM   Modules accepted: Orders

## 2020-07-01 NOTE — Patient Instructions (Signed)
We will check hepatic panel today I am glad your breathing is stable Follow-up in 1 month

## 2020-07-01 NOTE — Progress Notes (Signed)
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 is here for evaluation of interstitial lung disease He has history of rheumatoid arthritis for many years.  Has been on chronic methotrexate and folic acid since at least early 2000's.  He has been tried on Biologics and Plaquenil in the past.  Follows with Dr. Trudie Buchanan, rheumatology.  Pets: No pets Occupation: Retired Hydrologist professor Exposures: No known exposures.  No mold, hot tub, Jacuzzi, down pillows or comforter Smoking history: 80-pack-year smoker.  Quit in 2015 Travel history: Grew up in [facility].  Previously lived in [place], [place].  No significant recent travel Relevant family history: No significant family history of lung disease  Interim history: Continues on Ofev since August 2021 Is tolerating it well with no issues States that his breathing is slightly better today  Outpatient Encounter Medications as of 07/01/2020  Medication Sig  . albuterol (PROVENTIL HFA;VENTOLIN HFA) 108 (90 Base) MCG/ACT inhaler Inhale 2 puffs into the lungs every 6 (six) hours as needed for wheezing or shortness of breath.  Marland Kitchen aspirin EC 81 MG tablet Take 81 mg by mouth daily. Swallow whole.  Marland Kitchen BREO ELLIPTA 100-25 MCG/INH AEPB INHALE 1 PUFF INTO LUNGS DAILY (Patient taking differently: Inhale 1 puff into the lungs daily. )  . cetirizine (ZYRTEC) 10 MG tablet Take 10 mg by mouth daily as needed for allergies.   . DULoxetine (CYMBALTA) 60 MG capsule Take 60 mg by mouth every evening.   . finasteride (PROSCAR) 5 MG tablet Take 5 mg by mouth at bedtime.   . fluticasone (FLONASE) 50 MCG/ACT nasal spray Place 2 sprays into both nostrils daily as needed for allergies.   . folic acid (FOLVITE) 1 MG  tablet Take 1 mg by mouth daily.  . furosemide (LASIX) 20 MG tablet Take 1 tablet (43m total) daily for next 7 days for fluid management (Patient taking differently: Take 20 mg by mouth daily. )  . gabapentin (NEURONTIN) 300 MG capsule Take 300-800 mg by mouth Buchanan admin instructions. Take 300 mg in the morning 600 mg in the afternoon and 600-800 at bedtime  . ibuprofen (ADVIL) 200 MG tablet Take 600-800 mg by mouth daily as needed for mild pain or moderate pain.   .Marland Kitchenlosartan (COZAAR) 25 MG tablet TAKE ONE (1) TABLET BY MOUTH EACH DAY (Patient taking differently: Take 25 mg by mouth daily. )  . metFORMIN (GLUCOPHAGE) 500 MG tablet Take 500 mg by mouth daily.   . methotrexate (RHEUMATREX) 2.5 MG tablet Take 2.5 mg by mouth once a week. Caution:Chemotherapy. Protect from light.  . metoprolol succinate (TOPROL-XL) 50 MG 24 hr tablet Take 50 mg by mouth 2 (two) times daily.  .Marland KitchenOFEV 150 MG CAPS TAKE 1 CAPSULE BY MOUTH TWICE A DAY  . predniSONE (DELTASONE) 5 MG tablet Take 5 mg by mouth daily.   . simvastatin (ZOCOR) 40 MG tablet Take 40 mg by mouth at bedtime.   . sodium chloride (OCEAN) 0.65 % SOLN nasal spray Place 1 spray into both nostrils daily as needed for congestion.   .Marland KitchenSPIRIVA RESPIMAT 2.5 MCG/ACT AERS INHALE TWO (2) PUFFS INTO THE LUNGS ONCE DAILY (Patient taking differently: Take 2 puffs by mouth daily. )  .  tamsulosin (FLOMAX) 0.4 MG CAPS capsule Take 0.4 mg by mouth at bedtime.   . traMADol (ULTRAM) 50 MG tablet Take 50-100 mg by mouth every 6 (six) hours as needed for moderate pain or severe pain.   . traZODone (DESYREL) 50 MG tablet Take 50 mg by mouth at bedtime.   No facility-administered encounter medications on file as of 07/01/2020.    Physical Exam: Blood pressure 116/76, pulse (!) 121, temperature (!) 97.5 F (36.4 C), temperature source Skin, height _0  (1.702 m), weight 242 lb (109.8 kg), SpO2 99 %. Gen:      No acute distress HEENT:  EOMI, sclera anicteric Neck:     No  masses; no thyromegaly Lungs:    Bibasal crackles CV:         Regular rate and rhythm; no murmurs Abd:      + bowel sounds; soft, non-tender; no palpable masses, no distension Ext:    No edema; adequate peripheral perfusion Skin:      Warm and dry; no rash Neuro: alert and oriented x 3 Psych: normal mood and affect  Data Reviewed: Imaging: High-resolution CT 06/29/2018- Moderate emphysema. Calcified granulomas.  Subcentimeter pulmonary nodule.  Subpleural reticulation, mild traction bronchiectasis with basal gradient.  Right greater than left.  No honeycombing.  Slightly worse compared to previous CT scans. Probable UIP.  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  No significant change compared to 2019.  Enlarged PA.  Stable pulmonary nodule I have reviewed the images personally.  PFTs: 05/14/2019 FVC 2.32 [62%], FEV1 1.54 [58%], F/F 66, TLC 4.47 [69%], DLCO 13.34 [58%]  04/09/2020 FVC 1.73 (47%), FEV1 1.02 [39%], F/F 59, DLCO 13.71 [60%] Severe obstruction, moderate diffusion defect  Labs: CBC 04/06/2019-WBC 10.1, eos 0.8%, absolute eosinophil count 81   N-terminal proBNP 04/30/2020-213  Sleep PSG 04/25/2000.  Severe sleep apnea, AHI 78/hr.  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  CT shows stable fibrosis stable though there has been a deterioration in clinical status and PFTs Given progression he has been started on Ofev Check LFTs while on Ofev  He has been referred to pulmonary rehab and is awaiting a call back  Enlarged pulmonary artery Underwent right heart cath with  minimal elevation in PVR.  Does not need Tyvaso. BNP is normal.  COPD Continue Breo, Spiriva  OSA CPAP download reviewed with good compliance and response to therapy  Health maintenance Up-to-date with flu and pneumonia vaccine at primary care Up-to-date with COVID-19 vaccine  Plan/Recommendations: - Check hepatic panel - Follow-up in 1 month.  Marshell Garfinkel MD [facility] 07/01/2020, 11:47 AM  CC: Michael Glazier, MD

## 2020-07-01 NOTE — Progress Notes (Signed)
Six Minute Walk - 07/01/20 1134      Six Minute Walk   Medications taken before test (dose and time) Breo 833, Folic Acid 74UZH, Aspirin 36m, Gabapentin 3052m Ibuprofen 20046mMetoprolol 56m37mFEV 156mg9mednisone 5mg, 30mriva Respmat 2.5 at 7:30    Supplemental oxygen during test? Yes    O2 Flow Rate (L/min) 4 L/min    Type Continuous    Lap distance in meters  34 meters    Laps Completed 5    Partial lap (in meters) 0 meters    Baseline BP (sitting) 116/76    Baseline Heartrate 121    Baseline Dyspnea (Borg Scale) 2    Baseline Fatigue (Borg Scale) 2    Baseline SPO2 99 %      End of Test Values    BP (sitting) 140/90    Heartrate 134    Dyspnea (Borg Scale) 3    Fatigue (Borg Scale) 9    SPO2 96 %      2 Minutes Post Walk Values   BP (sitting) 128/80    Heartrate 124    SPO2 98 %    Stopped or paused before six minutes? Yes    Reason: Pt had to pause at 5min 470mc mark, at 5min 6s17mmark, at 4min 36s57mmark, at 3min 32se54mark, at 2min 20sec54mrk, and also at 1min 12 sec84mrk all for just about 10 seconds each time to catch his breath.    Other Symptoms at end of exercise: Dizziness;Hip pain;Calf pain   dizziness is a new symptom but hip pain and calf pain are not new symptoms     Interpretation   Distance completed 170 meters    Tech Comments: pt walked at a slow pace with a cane while pulling O2 on rolling cart. Pt had to stop and pause multiple times during the walk to catch his breathing. pt had complaints of dizziness which he said is a new symptom and also had hip pain and calf pain which are not new symptoms. pt was able to complete entire 6 min.

## 2020-07-14 ENCOUNTER — Other Ambulatory Visit: Payer: Self-pay | Admitting: Physician Assistant

## 2020-07-31 ENCOUNTER — Other Ambulatory Visit: Payer: Self-pay

## 2020-07-31 ENCOUNTER — Ambulatory Visit (HOSPITAL_BASED_OUTPATIENT_CLINIC_OR_DEPARTMENT_OTHER)
Admission: RE | Admit: 2020-07-31 | Discharge: 2020-07-31 | Disposition: A | Discharge status: Home / Self Care | Payer: Medicare PPO | Source: Ambulatory Visit | Attending: Pulmonary Disease | Admitting: Pulmonary Disease

## 2020-07-31 DIAGNOSIS — J849 Interstitial pulmonary disease, unspecified: Secondary | ICD-10-CM | POA: Diagnosis not present

## 2020-08-01 ENCOUNTER — Ambulatory Visit: Payer: Medicare PPO | Admitting: Pulmonary Disease

## 2020-08-26 ENCOUNTER — Other Ambulatory Visit: Payer: Self-pay

## 2020-08-26 ENCOUNTER — Encounter: Payer: Self-pay | Admitting: Pulmonary Disease

## 2020-08-26 ENCOUNTER — Ambulatory Visit: Payer: Medicare PPO | Admitting: Pulmonary Disease

## 2020-08-26 VITALS — BP 126/78 | HR 127 | Temp 97.0°F | Ht 67.0 in | Wt 240.0 lb

## 2020-08-26 DIAGNOSIS — G4733 Obstructive sleep apnea (adult) (pediatric): Secondary | ICD-10-CM | POA: Diagnosis not present

## 2020-08-26 DIAGNOSIS — Z9989 Dependence on other enabling machines and devices: Secondary | ICD-10-CM | POA: Diagnosis not present

## 2020-08-26 DIAGNOSIS — J849 Interstitial pulmonary disease, unspecified: Secondary | ICD-10-CM

## 2020-08-26 DIAGNOSIS — Z5181 Encounter for therapeutic drug level monitoring: Secondary | ICD-10-CM | POA: Diagnosis not present

## 2020-08-26 NOTE — Progress Notes (Addendum)
[name]    [id number]    May 28, 1941  Primary Care Physician:Piazza, Christian Mate, MD  Referring Physician: Javier Glazier, MD [place],  [place] [address]  Problem List: RA ILD, OSA Started Ofev. 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 is here for evaluation of interstitial lung disease He has history of rheumatoid arthritis for many years.  Has been on chronic methotrexate and folic acid since at least early 2000's.  He has been tried on Biologics and Plaquenil in the past.  Follows with Dr. Trudie Reed, rheumatology.  Pets: No pets Occupation: Retired Hydrologist professor Exposures: No known exposures.  No mold, hot tub, Jacuzzi, down pillows or comforter Smoking history: 80-pack-year smoker.  Quit in 2015 Travel history: Grew up in [facility].  Previously lived in [place], [place].  No significant recent travel Relevant family history: No significant family history of lung disease  Interim history: Continues on Ofev since August 2021 Is tolerating it well with no issues Breathing is stable.   Start Remicade for treatment of rheumatoid arthritis per Dr. Trudie Reed  Outpatient Encounter Medications as of 08/26/2020  Medication Sig  . albuterol (PROVENTIL HFA;VENTOLIN HFA) 108 (90 Base) MCG/ACT inhaler Inhale 2 puffs into the lungs every 6 (six) hours as needed for wheezing or shortness of breath.  Marland Kitchen aspirin EC 81 MG tablet Take 81 mg by mouth daily. Swallow whole.  Marland Kitchen BREO ELLIPTA 100-25 MCG/INH AEPB INHALE 1 PUFF INTO LUNGS DAILY (Patient taking differently: Inhale 1 puff into the lungs daily. )  . cetirizine (ZYRTEC) 10 MG tablet Take 10 mg by mouth daily as needed for allergies.   . DULoxetine (CYMBALTA) 60 MG capsule Take 60 mg by mouth every evening.   . finasteride (PROSCAR) 5 MG tablet Take 5 mg by mouth at bedtime.   . fluticasone (FLONASE) 50 MCG/ACT nasal spray Place 2 sprays into both nostrils daily as needed for  allergies.   . folic acid (FOLVITE) 1 MG tablet Take 1 mg by mouth daily.  . furosemide (LASIX) 20 MG tablet Take 1 tablet (14m total) daily for next 7 days for fluid management (Patient taking differently: Take 20 mg by mouth daily. )  . gabapentin (NEURONTIN) 300 MG capsule Take 300-800 mg by mouth See admin instructions. Take 300 mg in the morning 600 mg in the afternoon and 600-800 at bedtime  . ibuprofen (ADVIL) 200 MG tablet Take 600-800 mg by mouth daily as needed for mild pain or moderate pain.   .Marland Kitchenlosartan (COZAAR) 25 MG tablet TAKE ONE (1) TABLET BY MOUTH EACH DAY (Patient taking differently: Take 25 mg by mouth daily. )  . metFORMIN (GLUCOPHAGE) 500 MG tablet Take 500 mg by mouth daily.   . methotrexate (RHEUMATREX) 2.5 MG tablet Take 2.5 mg by mouth once a week. Caution:Chemotherapy. Protect from light.  . metoprolol succinate (TOPROL-XL) 50 MG 24 hr tablet TAKE ONE TABLET BY MOUTH TWICE DAILY WITH OR IMMEDIATELY FOLLOWING A MEAL  . OFEV 150 MG CAPS TAKE 1 CAPSULE BY MOUTH TWICE A DAY  . predniSONE (DELTASONE) 5 MG tablet Take 5 mg by mouth daily.   . simvastatin (ZOCOR) 40 MG tablet Take 40 mg by mouth at bedtime.   . sodium chloride (OCEAN) 0.65 % SOLN nasal spray Place 1 spray into both nostrils daily as needed for congestion.   .Marland KitchenSPIRIVA RESPIMAT 2.5 MCG/ACT AERS INHALE TWO (2) PUFFS INTO THE LUNGS  ONCE DAILY (Patient taking differently: Take 2 puffs by mouth daily. )  . tamsulosin (FLOMAX) 0.4 MG CAPS capsule Take 0.4 mg by mouth at bedtime.   . traMADol (ULTRAM) 50 MG tablet Take 50-100 mg by mouth every 6 (six) hours as needed for moderate pain or severe pain.   . traZODone (DESYREL) 50 MG tablet Take 50 mg by mouth at bedtime.   No facility-administered encounter medications on file as of 08/26/2020.    Physical Exam: Blood pressure 126/78, pulse (!) 127, temperature (!) 97 F (36.1 C), temperature source Other (Comment), height _0  (1.702 m), weight 240 lb (108.9 kg),  SpO2 98 %. Gen:      No acute distress HEENT:  EOMI, sclera anicteric Neck:     No masses; no thyromegaly Lungs:    Basal crackles CV:         Regular rate and rhythm; no murmurs Abd:      + bowel sounds; soft, non-tender; no palpable masses, no distension Ext:    No edema; adequate peripheral perfusion Skin:      Warm and dry; no rash Neuro: alert and oriented x 3 Psych: normal mood and affect  Data Reviewed: Imaging: High-resolution CT 06/29/2018- Moderate emphysema. Calcified granulomas.  Subcentimeter pulmonary nodule.  Subpleural reticulation, mild traction bronchiectasis with basal gradient.  Right greater than left.  No honeycombing.  Slightly worse compared to previous CT scans. Probable UIP.  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  No significant change compared to 2019.  Enlarged PA.  Stable pulmonary nodule I have reviewed the images personally.  PFTs: 05/14/2019 FVC 2.32 [62%], FEV1 1.54 [58%], F/F 66, TLC 4.47 [69%], DLCO 13.34 [58%]  04/09/2020 FVC 1.73 (47%), FEV1 1.02 [39%], F/F 59, DLCO 13.71 [60%] Severe obstruction, moderate diffusion defect  Labs: CBC 04/06/2019-WBC 10.1, eos 0.8%, absolute eosinophil count 81   N-terminal proBNP 04/30/2020-213  Sleep PSG 04/25/2000.  Severe sleep apnea, AHI 78/hr.  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  CT shows stable fibrosis stable though there has been a deterioration in clinical status and PFTs Given progression he has been started on Ofev which he is tolerating well Check LFTs today  He has been referred to  pulmonary rehab and is awaiting a call back.  Will make referral again  Enlarged pulmonary artery Underwent right heart cath with minimal elevation in PVR.  Does not need Tyvaso. BNP is normal.  Coronary atherosclerosis Noted on CT scan Had a heart catheterization with 50% LAD stenosis which is being managed medically.  COPD Continue Breo, Spiriva  OSA CPAP download reviewed with good compliance and response to therapy  Health maintenance Up-to-date with flu and pneumonia vaccine at primary care Up-to-date with COVID-19 vaccine  Plan/Recommendations: - Check hepatic panel - Follow-up in 2 months  Marshell Garfinkel MD [facility] 08/26/2020, 11:43 AM  CC: Javier Glazier, MD

## 2020-08-26 NOTE — Patient Instructions (Signed)
I am glad you are stable with your breathing Continue the Ofev We will check hepatic panel today Follow-up in 2 months.

## 2020-08-26 NOTE — Progress Notes (Deleted)
[name]    [id number]    06/27/1941  Primary Care Physician:Piazza, Michael Mate, MD  Referring Physician: Javier Glazier, MD [name],  [name] [id number]  Problem List: RA ILD, OSA Started Ofev. 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  Has been on chronic methotrexate and folic acid since at least early 2000's.  He has been tried on Biologics and Plaquenil in the past.  Follows with [name], rheumatology.  Pets: No pets Occupation: Retired Hydrologist professor Exposures: No known exposures.  No mold, hot tub, Jacuzzi, down pillows or comforter Smoking history: 80-pack-year smoker.  Quit in 2015 Travel history: Grew up in [place] [facility] [place].  Previously lived in [place], [place].  No significant recent travel Relevant family history: No significant family history of lung disease  Interim history: Continues on Ofev since August 2021 Is tolerating it well with no issues States that his breathing is slightly better today  Outpatient Encounter Medications as of 08/26/2020  Medication Sig   albuterol (PROVENTIL HFA;VENTOLIN HFA) 108 (90 Base) MCG/ACT inhaler Inhale 2 puffs into the lungs every 6 (six) hours as needed for wheezing or shortness of breath.   aspirin EC 81 MG tablet Take 81 mg by mouth daily. Swallow whole.   BREO ELLIPTA 100-25 MCG/INH AEPB INHALE 1 PUFF INTO LUNGS DAILY (Patient taking differently: Inhale 1 puff into the lungs daily. )   cetirizine (ZYRTEC) 10 MG tablet Take 10 mg by mouth daily as needed for allergies.    DULoxetine (CYMBALTA) 60 MG capsule Take 60 mg by mouth every evening.    finasteride (PROSCAR) 5 MG tablet Take 5 mg by mouth at bedtime.    fluticasone (FLONASE) 50 MCG/ACT nasal spray Place 2 sprays into both nostrils daily as needed for allergies.    folic acid (FOLVITE) 1 MG  tablet Take 1 mg by mouth daily.   furosemide (LASIX) 20 MG tablet Take 1 tablet (62m total) daily for next 7 days for fluid management (Patient taking differently: Take 20 mg by mouth daily. )   gabapentin (NEURONTIN) 300 MG capsule Take 300-800 mg by mouth See admin instructions. Take 300 mg in the morning 600 mg in the afternoon and 600-800 at bedtime   ibuprofen (ADVIL) 200 MG tablet Take 600-800 mg by mouth daily as needed for mild pain or moderate pain.    losartan (COZAAR) 25 MG tablet TAKE ONE (1) TABLET BY MOUTH EACH DAY (Patient taking differently: Take 25 mg by mouth daily. )   metFORMIN (GLUCOPHAGE) 500 MG tablet Take 500 mg by mouth daily.    methotrexate (RHEUMATREX) 2.5 MG tablet Take 2.5 mg by mouth once a week. Caution:Chemotherapy. Protect from light.   metoprolol succinate (TOPROL-XL) 50 MG 24 hr tablet TAKE ONE TABLET BY MOUTH TWICE DAILY WITH OR IMMEDIATELY FOLLOWING A MEAL   OFEV 150 MG CAPS TAKE 1 CAPSULE BY MOUTH TWICE A DAY   predniSONE (DELTASONE) 5 MG tablet Take 5 mg by mouth daily.    simvastatin (ZOCOR) 40 MG tablet Take 40 mg by mouth at bedtime.    sodium chloride (OCEAN) 0.65 % SOLN nasal spray Place 1 spray into both nostrils daily as needed for congestion.    SPIRIVA RESPIMAT 2.5 MCG/ACT AERS INHALE TWO (2) PUFFS INTO THE LUNGS ONCE DAILY (Patient taking differently: Take 2  puffs by mouth daily. )   tamsulosin (FLOMAX) 0.4 MG CAPS capsule Take 0.4 mg by mouth at bedtime.    traMADol (ULTRAM) 50 MG tablet Take 50-100 mg by mouth every 6 (six) hours as needed for moderate pain or severe pain.    traZODone (DESYREL) 50 MG tablet Take 50 mg by mouth at bedtime.   No facility-administered encounter medications on file as of 08/26/2020.    Physical Exam: Blood pressure 116/76, pulse (!) 121, temperature (!) 97.5 F (36.4 C), temperature source Skin, height 5' 7" (1.702 m), weight 242 lb (109.8 kg), SpO2 99 %. Gen:      No acute distress HEENT:   EOMI, sclera anicteric Neck:     No masses; no thyromegaly Lungs:    Bibasal crackles CV:         Regular rate and rhythm; no murmurs Abd:      + bowel sounds; soft, non-tender; no palpable masses, no distension Ext:    No edema; adequate peripheral perfusion Skin:      Warm and dry; no rash Neuro: alert and oriented x 3 Psych: normal mood and affect  Data Reviewed: Imaging: High-resolution CT 06/29/2018- Moderate emphysema. Calcified granulomas.  Subcentimeter pulmonary nodule.  Subpleural reticulation, mild traction bronchiectasis with basal gradient.  Right greater than left.  No honeycombing.  Slightly worse compared to previous CT scans. Probable UIP.  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  No significant change compared to 2019.  Enlarged PA.  Stable pulmonary nodule I have reviewed the images personally.  PFTs: 05/14/2019 FVC 2.32 [62%], FEV1 1.54 [58%], F/F 66, TLC 4.47 [69%], DLCO 13.34 [58%]  04/09/2020 FVC 1.73 (47%), FEV1 1.02 [39%], F/F 59, DLCO 13.71 [60%] Severe obstruction, moderate diffusion defect  Labs: CBC 04/06/2019-WBC 10.1, eos 0.8%, absolute eosinophil count 81   N-terminal proBNP 04/30/2020-213  Sleep PSG 04/25/2000.  Severe sleep apnea, AHI 78/hr.  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  CT shows stable fibrosis stable though there has been a deterioration in clinical status and PFTs Given progression he has been started on Ofev Check LFTs while on Ofev  He has been referred to pulmonary rehab and is awaiting a call back  Enlarged pulmonary  artery Underwent right heart cath with minimal elevation in PVR.  Does not need Tyvaso. BNP is normal.  COPD Continue Breo, Spiriva  OSA CPAP download reviewed with good compliance and response to therapy  Health maintenance Up-to-date with flu and pneumonia vaccine at primary care Up-to-date with COVID-19 vaccine  Plan/Recommendations: - Check hepatic panel - Follow-up in 1 month.  Marshell Garfinkel MD  Pulmonary and Critical Care 08/26/2020, 11:44 AM  CC: Michael Glazier, MD

## 2020-08-29 ENCOUNTER — Telehealth (HOSPITAL_COMMUNITY): Payer: Self-pay

## 2020-08-29 NOTE — Telephone Encounter (Signed)
Called patient to see if he was interested in participating in the Pulmonary Rehab Program. Patient stated yes. Patient will come in for orientation on 10/13/20 @ 10:30 and will attend the 1:15PM exercise class. Went over insurance, patient verbalized understanding. Also adv pt of [facility] mask policy.  Tourist information centre manager.

## 2020-09-02 NOTE — Telephone Encounter (Signed)
[name], Please see patient comment.  I have responded to his questions.  Thank you.

## 2020-09-02 NOTE — Telephone Encounter (Signed)
09/02/2020  Per our office policy the nurse practitioners can see patients but patients cannot be established with [place].  You still need to be established with a MD or DO in our practice.  I would highly recommend that you remain established with Dr. Vaughan Browner as I believe he does a great job and he is a interstitial lung disease specialist.  If you would like to see a different pulmonary provider I would recommend our other ILD specialist which is Dr. Chase Caller.  If neither of those are providers it would like to establish with then could get established with either Dr. Silas Flood, Dr. Erin Fulling or Dr. Shearon Stalls.  Hopefully this is helpful.  [name], FNP

## 2020-09-03 ENCOUNTER — Other Ambulatory Visit (INDEPENDENT_AMBULATORY_CARE_PROVIDER_SITE_OTHER): Payer: Medicare PPO

## 2020-09-03 DIAGNOSIS — J849 Interstitial pulmonary disease, unspecified: Secondary | ICD-10-CM

## 2020-09-03 LAB — HEPATIC FUNCTION PANEL
ALT: 18 U/L (ref 0–53)
AST: 20 U/L (ref 0–37)
Albumin: 4 g/dL (ref 3.5–5.2)
Alkaline Phosphatase: 50 U/L (ref 39–117)
Bilirubin, Direct: 0.2 mg/dL (ref 0.0–0.3)
Total Bilirubin: 1.1 mg/dL (ref 0.2–1.2)
Total Protein: 6.7 g/dL (ref 6.0–8.3)

## 2020-09-03 NOTE — Telephone Encounter (Signed)
Dr. Vaughan Browner please see patient mychart message  Yes, you may share my wishes with Dr. Vaughan Browner.  You may switch me to Dr. Chase Caller. If either Doctor wishes to talk with me about my desire for change I can be reached at [phone]. Thanks for your help.   Dr. Vaughan Browner are you ok with patient switching?  Dr. Chase Caller are you ok with seeing this patient?

## 2020-09-04 NOTE — Telephone Encounter (Signed)
I am ok with the switch

## 2020-09-04 NOTE — Telephone Encounter (Signed)
Dr. Chase Caller please advise if you are ok with taking on this patient.  Thanks!

## 2020-09-05 NOTE — Telephone Encounter (Signed)
Ok I am happy to see him. Please arrange for 30 min visit, spiro/dlco at time of visit. Will have to be in Feb 2021. Can be any day because I am now seeing ILD on all days. He can see me in [place] or [place]. His address ius   [address] [place] [address]

## 2020-09-08 ENCOUNTER — Telehealth (HOSPITAL_COMMUNITY): Payer: Self-pay | Admitting: Internal Medicine

## 2020-10-09 ENCOUNTER — Telehealth (HOSPITAL_COMMUNITY): Payer: Self-pay | Admitting: *Deleted

## 2020-10-13 ENCOUNTER — Ambulatory Visit (HOSPITAL_COMMUNITY): Payer: Medicare PPO

## 2020-10-14 ENCOUNTER — Telehealth (HOSPITAL_COMMUNITY): Payer: Self-pay | Admitting: *Deleted

## 2020-10-15 ENCOUNTER — Encounter (HOSPITAL_COMMUNITY)
Admission: RE | Admit: 2020-10-15 | Discharge: 2020-10-15 | Disposition: A | Discharge status: Home / Self Care | Payer: Medicare PPO | Source: Ambulatory Visit | Attending: Pulmonary Disease | Admitting: Pulmonary Disease

## 2020-10-15 ENCOUNTER — Other Ambulatory Visit: Payer: Self-pay

## 2020-10-15 DIAGNOSIS — J849 Interstitial pulmonary disease, unspecified: Secondary | ICD-10-CM | POA: Insufficient documentation

## 2020-10-15 NOTE — Progress Notes (Signed)
"  [name]" arrived for orientation/walk test in pulmonary rehab today.  After discussing his health history which includes multiple back surgeries and hip surgery, he has chronic pain which limits him from exercising for more than 5 minutes at a time.  Due to theses issues and pulmonary hypertension there is no equipment we have in our gym that "Jere" can tolerate.  Physical therapy would be a better choice, I sent an in-basket message to Dr. Vaughan Browner describing the above.  [name] was in agreement with the plan and will not be attending pulmonary rehab. [id number]

## 2020-10-21 ENCOUNTER — Ambulatory Visit (HOSPITAL_COMMUNITY): Payer: Medicare PPO

## 2020-10-23 ENCOUNTER — Ambulatory Visit (HOSPITAL_COMMUNITY): Payer: Medicare PPO

## 2020-10-24 ENCOUNTER — Ambulatory Visit: Payer: Medicare PPO | Admitting: Pulmonary Disease

## 2020-10-28 ENCOUNTER — Ambulatory Visit (HOSPITAL_COMMUNITY): Payer: Medicare PPO

## 2020-10-30 ENCOUNTER — Ambulatory Visit (HOSPITAL_COMMUNITY): Payer: Medicare PPO

## 2020-11-04 ENCOUNTER — Ambulatory Visit (HOSPITAL_COMMUNITY): Payer: Medicare PPO

## 2020-11-05 ENCOUNTER — Other Ambulatory Visit (INDEPENDENT_AMBULATORY_CARE_PROVIDER_SITE_OTHER): Payer: Medicare PPO

## 2020-11-05 ENCOUNTER — Encounter: Payer: Self-pay | Admitting: Internal Medicine

## 2020-11-05 ENCOUNTER — Other Ambulatory Visit: Payer: Self-pay

## 2020-11-05 ENCOUNTER — Ambulatory Visit: Payer: Medicare PPO | Admitting: Internal Medicine

## 2020-11-05 VITALS — BP 118/68 | HR 76 | Temp 97.4°F | Ht 67.0 in | Wt 236.0 lb

## 2020-11-05 DIAGNOSIS — M0579 Rheumatoid arthritis with rheumatoid factor of multiple sites without organ or systems involvement: Secondary | ICD-10-CM

## 2020-11-05 DIAGNOSIS — J849 Interstitial pulmonary disease, unspecified: Secondary | ICD-10-CM

## 2020-11-05 DIAGNOSIS — Z5181 Encounter for therapeutic drug level monitoring: Secondary | ICD-10-CM | POA: Diagnosis not present

## 2020-11-05 DIAGNOSIS — Z79899 Other long term (current) drug therapy: Secondary | ICD-10-CM

## 2020-11-05 DIAGNOSIS — J8489 Other specified interstitial pulmonary diseases: Secondary | ICD-10-CM

## 2020-11-05 DIAGNOSIS — D84821 Immunodeficiency due to drugs: Secondary | ICD-10-CM

## 2020-11-05 DIAGNOSIS — Z7185 Encounter for immunization safety counseling: Secondary | ICD-10-CM

## 2020-11-05 DIAGNOSIS — M359 Systemic involvement of connective tissue, unspecified: Secondary | ICD-10-CM

## 2020-11-05 LAB — SARS-COV-2 IGG: SARS-COV-2 IgG: 0.1

## 2020-11-05 LAB — HEPATIC FUNCTION PANEL
ALT: 16 U/L (ref 0–53)
AST: 20 U/L (ref 0–37)
Albumin: 4 g/dL (ref 3.5–5.2)
Alkaline Phosphatase: 50 U/L (ref 39–117)
Bilirubin, Direct: 0.3 mg/dL (ref 0.0–0.3)
Total Bilirubin: 1.5 mg/dL — ABNORMAL HIGH (ref 0.2–1.2)
Total Protein: 6.8 g/dL (ref 6.0–8.3)

## 2020-11-05 NOTE — Patient Instructions (Addendum)
Interstitial lung disease due to connective tissue disease (HCC) ILD (interstitial lung disease) (HCC) Therapeutic drug monitoring Rheumatoid arthritis involving multiple sites with positive rheumatoid factor ([place])  -Heard your  concern that nintedanib might not be fully effective although your wife thinks it is potentially effective for you and definitely heard you are concerned that nintedanib is causing side effects and therefore in final balance this might not be the best option for you  -We could get a trend line of your pulmonary function test pre and post nintedanib and and make a guess if the nintedanib has helped you  Plan -Check liver function test today [last one in December 2021] -Do full pulmonary function test as soon as possible -We will reassess you after the pulmonary function test and then will assess if we think nintedanib is started working versus the side effects and decide whether to continue or stop the therapy -We will consider you for ILD-pro registry study   Immunosuppression due to drug therapy ([place]) Vaccine counseling  -You immunosuppressant you might not have responded to the Covid vaccine  Plan -Check Covid IgG today and if this is negative will refer you to monoclonal antibody prophylaxis treatment   Follow-up -Return in the next few to several weeks and a 30-minute slot to see Dr. Chase Caller

## 2020-11-05 NOTE — Progress Notes (Signed)
xxxxxxxxxxxxxxxxxxxxxxxxx Problem List: RA ILD, OSA Started Ofev. 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  Has been on chronic methotrexate and folic acid since at least early 2000's.  He has been tried on Biologics and Plaquenil in the past.  Follows with [name], rheumatology.  Pets: No pets Occupation: Retired Hydrologist professor Exposures: No known exposures.  No mold, hot tub, Jacuzzi, down pillows or comforter Smoking history: 80-pack-year smoker.  Quit in 2015 Travel history: Grew up in [facility].  Previously lived in [place], [place].  No significant recent travel Relevant family history: No significant family history of lung disease  Interim history: Continues on Ofev since August 2021 Is tolerating it well with no issues Breathing is stable.   Start Remicade for treatment of rheumatoid arthritis per [name]   Data Reviewed: Imaging: High-resolution CT 06/29/2018- Moderate emphysema. Calcified granulomas.  Subcentimeter pulmonary nodule.  Subpleural reticulation, mild traction bronchiectasis with basal gradient.  Right greater than left.  No honeycombing.  Slightly worse compared to previous CT scans. Probable UIP.  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  No significant change compared to 2019.  Enlarged PA.  Stable pulmonary nodule I have reviewed the images personally.  PFTs: 05/14/2019 FVC 2.32 [62%], FEV1 1.54 [58%], F/F 66, TLC 4.47 [69%], DLCO 13.34 [58%]  04/09/2020 FVC 1.73 (47%), FEV1 1.02 [39%], F/F 59, DLCO 13.71 [60%] Severe obstruction, moderate diffusion defect  Labs: CBC 04/06/2019-WBC 10.1, eos 0.8%, absolute eosinophil count 81   N-terminal proBNP 04/30/2020-213  Sleep PSG 04/25/2000.  Severe sleep apnea, AHI 78/hr.  CPAP download 06/29/2020-100%  compliance with residual AHI of 0.9  Cardiac: Echocardiogram 05/15/2020-RVSP is mildly reduced, normal PA systolic pressure of 32, LVEF 60-65% with mild LVH  Cardiac cath 05/27/2020-normal filling pressures, 50 to 60% mild LAD stenosis, mild pulmonary hypertension with PVR 3.2 PA 44/11, mean 29 PCWP mean 11   Assessment:  RA ILD CT scan reviewed with probable UIP pattern.  CT shows stable fibrosis stable though there has been a deterioration in clinical status and PFTs Given progression he has been started on Ofev which he is tolerating well Check LFTs today  He has been referred to pulmonary rehab and is awaiting a call back.  Will make referral again  Enlarged pulmonary artery Underwent right heart cath with minimal elevation in PVR.  Does not need Tyvaso. BNP is normal.  Coronary atherosclerosis Noted on CT scan Had a heart catheterization with 50% LAD stenosis which is being managed medically.  COPD Continue Breo, Spiriva  OSA CPAP download reviewed with good compliance and response to therapy  Health maintenance Up-to-date with flu and pneumonia vaccine at primary care Up-to-date with COVID-19 vaccine  Plan/Recommendations: - Check hepatic panel - Follow-up in 2 months  Michael Garfinkel Buchanan [facility] 08/26/2020, 11:43 AM       OV 11/06/2020  Subjective:  [name], male , DOB: 17-May-1941 , age 54 y.o. , MRN: [id number] , ADDRESS: [address] [place] [id number] PCP [name] Patient Care Team: [name] as PCP - General (Internal Medicine) [name] as Consulting Physician (Rheumatology)  This Provider for this visit: Treatment Team:  Attending Provider: Brand Males, Buchanan    11/06/2020 -   Chief Complaint  Patient presents  with  . Follow-up    Pt states he is about the same since last visit. Pt has been using his oxygen more frequently due to SOB.    #RA-ILD - Prob UIP. Prog phenotype  - He has history of rheumatoid arthritis for many years.  Has been on chronic methotrexate and folic acid since at least early 2000's.  He has been tried on Biologics and Plaquenil in the past.  with [name], rheumatology.  -Remicade every 8 weeks current as of September 2021  -On nintedanib since August 2021  #Associatd Emphysema #Severe OSA    - PSG 04/25/2000.  Severe sleep apnea, AHI 78/hr.  - CPAP download 06/29/2020-100% compliance with residual AHI of 0.9 #Enlarged pulmonary artery on the CT scan  -  Cardiac cath 05/27/2020-normal filling pressures, 50 to 60% mild LAD stenosis, mild pulmonary hypertension with PVR 3.2 PA 44/11, mean 29, PCWP mean 11 ##Chronic respiratory failure due to above #Multiple medical problems including back pain  #Transfer of care from [name] [name] at the ILD center 11/05/20  HPI [name] 80 y.o. -transfer of care. He has interstitial lung disease. I'm meeting him for the first time. He presents with his wife. He has multiple issues that are medical. Is significantly limited in his mobility. He uses a walker. Is because of his rheumatoid arthritis and back issues. At this point in time he is transferred his ILD care to see [name] myself. He has been on nintedanib since August of 2021. His wife subjectively feels it has helped him but then she says it is because she is an optimistic person. Patient is not sure that it has helped him. He feels that it is an inconvenience and it is causing some side effects described in the symptom score below. He is also not sure it is beneficial. He he and I discussed various studies showing the efficacy of nintedanib. Overall consensus is that best describes an allergy it acts as a hand brake in the number needed to treat and IPF at least is 1 to 6. However he does admit that he has progressive ILD as documented by the pulmonary function testing below. We did  discuss that benefit is to be weighed against the quality of life and goals of care. He is appreciative of that. He was supposed to have pulmonary function test for this visit based on instructions again to the office but for some unclear reason this has not happened.     SYMPTOM SCALE - ILD 11/06/2020   O2 use yes  Shortness of Breath 0 -> 5 scale with 5 being worst (score 6 If unable to do)  At rest 1.5 (4 per wife)  Simple tasks - showers, clothes change, eating, shaving 3.5  (5 per wife)  Household (dishes, doing bed, laundry) 6  Shopping 3  Walking level at own pace 4.5 (5 per wife)  Walking up Stairs 6  Total (30-36) Dyspnea Score 24.5  How bad is your cough? Varies usually frequent  How bad is your fatigue bad  How bad is nausea vries  How bad is vomiting?  na  How bad is diarrhea? na  How bad is anxiety? low  How bad is depression Medium and increasing        PFT  PFT Results Latest Ref Rng & Units 04/09/2020 11/09/2019 05/14/2019 07/04/2018  FVC-Pre L 1.73 2.16 2.40 2.64  FVC-Predicted Pre % 47 58 64 71  FVC-Post L - 2.34 2.32  2.59  FVC-Predicted Post % - 64 62 70  Pre FEV1/FVC % % 59 61 66 71  Post FEV1/FCV % % - 65 66 72  FEV1-Pre L 1.02 1.31 1.57 1.87  FEV1-Predicted Pre % 39 49 59 70  FEV1-Post L - 1.52 1.54 1.86  DLCO uncorrected ml/min/mmHg 13.71 14.23 13.34 14.88  DLCO UNC% % 60 62 58 52  DLCO corrected ml/min/mmHg 13.71 14.53 13.83 15.33  DLCO COR %Predicted % 60 64 60 54  DLVA Predicted % 98 92 84 78  TLC L - 4.15 4.47 5.26  TLC % Predicted % - 64 69 81  RV % Predicted % - 76 89 109   CT chest high resolution   IMPRESSION: 1. The appearance of the lungs is stable compared to prior studies, with a spectrum of findings that is once again categorized as probable usual interstitial pneumonia (UIP) per current ATS guidelines. 2. There is also mild diffuse bronchial wall thickening with mild centrilobular and paraseptal emphysema; imaging findings  suggestive of concurrent COPD. 3. Dilatation of the pulmonic trunk (3.9 cm in diameter), concerning for associated pulmonary arterial hypertension. 4. Aortic atherosclerosis, in addition to left main and 3 vessel coronary artery disease. Assessment for potential risk factor modification, dietary therapy or pharmacologic therapy may be warranted, if clinically indicated. 5. There are severe calcifications of the aortic valve and mitral valve/annulus. Echocardiographic correlation for evaluation of potential valvular dysfunction may be warranted if clinically indicated.  Aortic Atherosclerosis (ICD10-I70.0) and Emphysema (ICD10-J43.9).   Electronically Signed   By: Vinnie Langton M.D.   On: 07/31/2020 11:59     Result History     has a past medical history of Chronic lower back pain, Complication of anesthesia (01/2017), COPD (chronic obstructive pulmonary disease) (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   reports that he quit smoking about 6 years ago. His smoking use included cigarettes. He started smoking about 59 years ago. He has a 81.00 pack-year smoking history. He has never used smokeless tobacco.  Past Surgical History:  Procedure Laterality Date  . BACK SURGERY    . COLONOSCOPY W/ POLYPECTOMY    . FRACTURE SURGERY    . HERNIA REPAIR    . HIP ARTHROPLASTY Left 04/2016  . INGUINAL HERNIA REPAIR Left 1986  . JOINT REPLACEMENT    . LUMBAR WOUND DEBRIDEMENT N/A 04/18/2017   Procedure: REPAIR OF LUMBAR PSEUDOMENINGOCELE;  Surgeon: Newman Pies, Buchanan;  Location: [facility];  Service:  Neurosurgery;  Laterality: N/A;  REAIR OF LUMBAR PSEUDOMENINGOCELE  . LUMBAR WOUND DEBRIDEMENT N/A 05/05/2017   Procedure: I&D Lumbar Wound;  Surgeon: Newman Pies, Buchanan;  Location: [facility];  Service: Neurosurgery;  Laterality: N/A;  . MAXIMUM ACCESS (MAS)POSTERIOR LUMBAR INTERBODY FUSION (PLIF) 3 LEVEL  02/17/2017   [name] 02/17/2017  . OPEN SURGICAL REPAIR OF GLUTEAL TENDON Right 11/02/2017   Procedure: Right hip gluteal tendon repair;  Surgeon: Gaynelle Arabian, Buchanan;  Location: [facility];  Service: Orthopedics;  Laterality: Right;  . POSTERIOR LAMINECTOMY / DECOMPRESSION LUMBAR SPINE  2010  . PROSTATE BIOPSY    . PROSTATE BIOPSY  <2013 X 3  . RIGHT HEART CATH AND CORONARY ANGIOGRAPHY N/A 05/27/2020   Procedure: RIGHT HEART CATH AND CORONARY ANGIOGRAPHY;  Surgeon: Larey Dresser, Buchanan;  Location: [facility];  Service: Cardiovascular;  Laterality: N/A;  . SPLENECTOMY  1955  . TONSILLECTOMY    . TOTAL HIP ARTHROPLASTY Right 10/2015  . UMBILICAL HERNIA REPAIR  05/2016    Allergies  Allergen Reactions  . Sulfa Antibiotics Shortness Of Breath  . Sulfamethoxazole-Trimethoprim Shortness Of Breath  . Sulfasalazine Shortness Of Breath  . Other Other (See Comments)  . Varenicline Other (See Comments)     Strange thoughts SUICIDAL  . Lisinopril Cough    Immunization History  Administered Date(s) Administered  . 19-influenza Whole 07/18/2012, 06/14/2014, 08/12/2015, 06/03/2016  . HiB (PRP-T) 08/23/2017  . Influenza, High Dose Seasonal PF 06/12/2018, 07/06/2019, 06/30/2020  . Influenza,inj,Quad PF,6+ Mos 06/28/2017  . Influenza-Unspecified 07/18/2012, 06/14/2014, 08/12/2015, 06/03/2016, 06/28/2017  . Meningococcal B, OMV 08/23/2017, 09/21/2017  . Meningococcal Conjugate 08/23/2017  . Meningococcal Mcv4o 08/23/2017, 10/18/2017  . Moderna Sars-Covid-2 Vaccination 10/11/2019, 11/06/2019, 07/30/2020  . Pneumococcal Conjugate-13 11/28/2013, 11/28/2013  . Pneumococcal Polysaccharide-23  12/26/2005, 09/13/2012  . Pneumococcal-Unspecified 12/26/2005, 09/13/2012  . Tdap 11/28/2013, 11/28/2013  . Zoster 06/06/2012, 06/06/2012    Family History  Problem Relation Age of Onset  . Cancer Mother   . Heart disease Father   . Stroke Father      Current Outpatient Medications:  .  albuterol (PROVENTIL HFA;VENTOLIN HFA) 108 (90 Base) MCG/ACT inhaler, Inhale 2 puffs into the lungs every 6 (six) hours as needed for wheezing or shortness of breath., Disp: , Rfl:  .  aspirin EC 81 MG tablet, Take 81 mg by mouth daily. Swallow whole., Disp: , Rfl:  .  BREO ELLIPTA 100-25 MCG/INH AEPB, INHALE 1 PUFF INTO LUNGS DAILY (Patient taking differently: Inhale 1 puff into the lungs daily.), Disp: 180 each, Rfl: 1 .  calcium carbonate (TUMS EX) 750 MG chewable tablet, Chew 1 tablet by mouth as needed for heartburn., Disp: , Rfl:  .  cetirizine (ZYRTEC) 10 MG tablet, Take 10 mg by mouth daily as needed for allergies. , Disp: , Rfl:  .  DULoxetine (CYMBALTA) 60 MG capsule, Take 60 mg by mouth every evening. , Disp: , Rfl:  .  finasteride (PROSCAR) 5 MG tablet, Take 5 mg by mouth at bedtime. , Disp: , Rfl:  .  fluticasone (FLONASE) 50 MCG/ACT nasal spray, Place 2 sprays into both nostrils daily as needed for allergies. , Disp: , Rfl:  .  folic acid (FOLVITE) 1 MG tablet, Take 1 mg by mouth daily., Disp: , Rfl:  .  furosemide (LASIX) 20 MG tablet, Take 1 tablet (79m total) daily for next 7 days for fluid management (Patient taking differently: Take 20 mg by mouth daily.), Disp: 7 tablet, Rfl: 0 .  gabapentin (NEURONTIN) 300 MG capsule, Take 300-800 mg by mouth See admin instructions. Take 300 mg in the morning 600 mg in the afternoon and 600-800 at bedtime, Disp: , Rfl:  .  ibuprofen (ADVIL) 200 MG tablet, Take 600-800 mg by mouth daily as needed for mild pain or moderate pain. , Disp: , Rfl:  .  losartan (COZAAR) 25 MG tablet, TAKE ONE (1) TABLET BY MOUTH EACH DAY (Patient taking differently: Take 25  mg by mouth daily.), Disp: 90 tablet, Rfl: 1 .  metFORMIN (GLUCOPHAGE) 500 MG tablet, Take 500 mg by mouth daily. , Disp: , Rfl:  .  methotrexate (RHEUMATREX) 2.5 MG tablet, Take 2.5 mg by mouth once a week. Caution:Chemotherapy. Protect from light., Disp: , Rfl:  .  metoprolol succinate (TOPROL-XL)  50 MG 24 hr tablet, TAKE ONE TABLET BY MOUTH TWICE DAILY WITH OR IMMEDIATELY FOLLOWING A MEAL, Disp: 180 tablet, Rfl: 3 .  Multiple Vitamins-Minerals (CENTRUM SILVER 50+MEN) TABS, Take 1 tablet by mouth daily., Disp: , Rfl:  .  OFEV 150 MG CAPS, TAKE 1 CAPSULE BY MOUTH TWICE A DAY, Disp: 60 capsule, Rfl: 2 .  predniSONE (DELTASONE) 5 MG tablet, Take 5 mg by mouth daily. , Disp: , Rfl:  .  simvastatin (ZOCOR) 40 MG tablet, Take 40 mg by mouth at bedtime. , Disp: , Rfl:  .  sodium chloride (OCEAN) 0.65 % SOLN nasal spray, Place 1 spray into both nostrils daily as needed for congestion. , Disp: , Rfl:  .  SPIRIVA RESPIMAT 2.5 MCG/ACT AERS, INHALE TWO (2) PUFFS INTO THE LUNGS ONCE DAILY (Patient taking differently: Take 2 puffs by mouth daily.), Disp: 12 g, Rfl: 3 .  tamsulosin (FLOMAX) 0.4 MG CAPS capsule, Take 0.4 mg by mouth at bedtime. , Disp: , Rfl:  .  traMADol (ULTRAM) 50 MG tablet, Take 50-100 mg by mouth every 6 (six) hours as needed for moderate pain or severe pain. , Disp: , Rfl:  .  traZODone (DESYREL) 50 MG tablet, Take 50 mg by mouth at bedtime., Disp: , Rfl:       Objective:   Vitals:   11/05/20 1111  BP: 118/68  Pulse: 76  Temp: (!) 97.4 F (36.3 C)  TempSrc: Temporal  SpO2: 98%  Weight: 236 lb (107 kg)  Height: _0  (1.702 m)    Estimated body mass index is 36.96 kg/m as calculated from the following:   Height as of this encounter: _1  (1.702 m).   Weight as of this encounter: 236 lb (107 kg).  _2 @  Filed Weights   11/05/20 1111  Weight: 236 lb (107 kg)     Physical Exam onGeneral: No distress. obese Neuro: Alert and Oriented x 3. GCS 15. Speech  normal Psych: Pleasant Resp:  Barrel Chest - no.  Wheeze - no, Crackles - yes, No overt respiratory distress CVS: Normal heart sounds. Murmurs - no Ext: Stigmata of Connective Tissue Disease - yes RA HEENT: Normal upper airway. PEERL +. No post nasal drip        Assessment:       ICD-10-CM   1. Interstitial lung disease due to connective tissue disease ([place])  J84.89    M35.9   2. ILD (interstitial lung disease) ([place])  J84.9 Pulmonary function test    Hepatic function panel  3. Therapeutic drug monitoring  Z51.81   4. Rheumatoid arthritis involving multiple sites with positive rheumatoid factor ([facility])  M05.79   5. 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 Vaccine counseling  Z71.85 SARS-COV-2 IgG       Plan:     Patient Instructions  Interstitial lung disease due to connective tissue disease ([place]) ILD (interstitial lung disease) ([place]) Therapeutic drug monitoring Rheumatoid arthritis involving multiple sites with positive rheumatoid factor ([place])  -Heard your  concern that nintedanib might not be fully effective although your wife thinks it is potentially effective for you and definitely heard you are concerned that nintedanib is causing side effects and therefore in final balance this might not be the best option for you  -We could get a trend line of your pulmonary function test pre and post nintedanib and and make a guess if the nintedanib has helped you  Plan -Check liver  function test today [last one in December 2021] -Do full pulmonary function test as soon as possible -We will reassess you after the pulmonary function test and then will assess if we think nintedanib is started working versus the side effects and decide whether to continue or stop the therapy -We will consider you for ILD-pro registry study   Immunosuppression due to drug therapy ([place]) Vaccine counseling  -You immunosuppressant you might not have responded to the Covid  vaccine  Plan -Check Covid IgG today and if this is negative will refer you to monoclonal antibody prophylaxis treatment   Follow-up -Return in the next few to several weeks and a 30-minute slot to see [name]    ( Level 05 visit: Estb 40-54 min  in  visit type: on-site physical face to visit  in total care time and counseling or/and coordination of care by this undersigned Buchanan - Dr [name]. This includes one or more of the following on this same day 11/05/2020: pre-charting, chart review, note writing, documentation discussion of test results, diagnostic or treatment recommendations, prognosis, risks and benefits of management options, instructions, education, compliance or risk-factor reduction. It excludes time spent by the [facility] or office staff in the care of the patient. Actual time 40 min)   SIGNATURE    Dr. Brand Buchanan, M.D., F.C.C.P,  Pulmonary and Critical Care Medicine Staff Physician, [place] Director - Interstitial Lung Disease  Program  Pulmonary [facility] at [place], [place], [address]  Pager: [phone], If no answer or between  15:00h - 7:00h: call [phone] Telephone: [phone]  6:59 PM 11/06/2020

## 2020-11-06 ENCOUNTER — Telehealth: Payer: Self-pay | Admitting: Internal Medicine

## 2020-11-06 ENCOUNTER — Ambulatory Visit (HOSPITAL_COMMUNITY): Payer: Medicare PPO

## 2020-11-06 DIAGNOSIS — J849 Interstitial pulmonary disease, unspecified: Secondary | ICD-10-CM

## 2020-11-06 DIAGNOSIS — M0579 Rheumatoid arthritis with rheumatoid factor of multiple sites without organ or systems involvement: Secondary | ICD-10-CM

## 2020-11-06 DIAGNOSIS — M359 Systemic involvement of connective tissue, unspecified: Secondary | ICD-10-CM

## 2020-11-06 DIAGNOSIS — D84821 Immunodeficiency due to drugs: Secondary | ICD-10-CM

## 2020-11-06 DIAGNOSIS — Z5181 Encounter for therapeutic drug level monitoring: Secondary | ICD-10-CM

## 2020-11-06 DIAGNOSIS — Z79899 Other long term (current) drug therapy: Secondary | ICD-10-CM

## 2020-11-06 NOTE — Telephone Encounter (Signed)
He got his Covid booster on November third 2021. However his Covid IgG is nonreactive. Usually at 3 months it is positive in response to the vaccine. This means it is very likely he has not responded to the vaccine  -Also his bilirubin is slightly high at 1.5. Previously it was normal. This could be because of medications including methotrexate and nintedanib   Plan -Refer to [facility] infusion center for  rVUSHELD -because he is high risk given his immunosuppression -Recheck liver function test in 1-2 weeks. If it is continuing to go up we might have to stop nintedanib   Recent Labs  Lab 11/05/20 1248  AST 20  ALT 16  ALKPHOS 50  BILITOT 1.5*  PROT 6.8  ALBUMIN 4.0    Results for GEORGIOS, KINA" (MRN [id number]) as of 11/06/2020 19:02  Ref. Range 11/05/2020 12:48  SARS-COV-2 IgG Latest Ref Range: Non-Reactive  0.10 Non-Reactive    Immunization History  Administered Date(s) Administered  . 19-influenza Whole 07/18/2012, 06/14/2014, 08/12/2015, 06/03/2016  . HiB (PRP-T) 08/23/2017  . Influenza, High Dose Seasonal PF 06/12/2018, 07/06/2019, 06/30/2020  . Influenza,inj,Quad PF,6+ Mos 06/28/2017  . Influenza-Unspecified 07/18/2012, 06/14/2014, 08/12/2015, 06/03/2016, 06/28/2017  . Meningococcal B, OMV 08/23/2017, 09/21/2017  . Meningococcal Conjugate 08/23/2017  . Meningococcal Mcv4o 08/23/2017, 10/18/2017  . Moderna Sars-Covid-2 Vaccination 10/11/2019, 11/06/2019, 07/30/2020  . Pneumococcal Conjugate-13 11/28/2013, 11/28/2013  . Pneumococcal Polysaccharide-23 12/26/2005, 09/13/2012  . Pneumococcal-Unspecified 12/26/2005, 09/13/2012  . Tdap 11/28/2013, 11/28/2013  . Zoster 06/06/2012, 06/06/2012

## 2020-11-07 NOTE — Addendum Note (Signed)
Addended by: Lorretta Harp on: 11/07/2020 12:24 PM   Modules accepted: Orders

## 2020-11-07 NOTE — Telephone Encounter (Signed)
Called and spoke with pt letting him know the results of labwork and info stated by MR. Pt verbalized understanding and was okay with [place] placing referral for EVUSHELD. Also pt has been made aware that we will repeat labwork in 1-2 weeks. Orders have been placed. Nothing further needed.

## 2020-11-08 ENCOUNTER — Other Ambulatory Visit: Payer: Self-pay | Admitting: Physician Assistant

## 2020-11-08 DIAGNOSIS — M0579 Rheumatoid arthritis with rheumatoid factor of multiple sites without organ or systems involvement: Secondary | ICD-10-CM

## 2020-11-08 DIAGNOSIS — J8410 Pulmonary fibrosis, unspecified: Secondary | ICD-10-CM

## 2020-11-10 ENCOUNTER — Other Ambulatory Visit (HOSPITAL_COMMUNITY)
Admission: RE | Admit: 2020-11-10 | Discharge: 2020-11-10 | Disposition: A | Discharge status: Home / Self Care | Payer: Medicare PPO | Source: Ambulatory Visit | Attending: Internal Medicine | Admitting: Internal Medicine

## 2020-11-10 ENCOUNTER — Telehealth (HOSPITAL_COMMUNITY): Payer: Self-pay | Admitting: Adult Health

## 2020-11-10 DIAGNOSIS — Z20822 Contact with and (suspected) exposure to covid-19: Secondary | ICD-10-CM | POA: Diagnosis not present

## 2020-11-10 DIAGNOSIS — Z01812 Encounter for preprocedural laboratory examination: Secondary | ICD-10-CM | POA: Insufficient documentation

## 2020-11-10 NOTE — Telephone Encounter (Signed)
Called and spoke briefly to Mr. Repsher about Rexford Maus.  He noted that he has an upcoming meeting at 1030.  I will call him back at 1145 this morning.    Wilber Bihari, NP

## 2020-11-10 NOTE — Telephone Encounter (Signed)
I called patient today at 1149 am to discuss Evusheld for COVID 19 pre exposure prophylaxis.  LMOM for him to return my call.    Wilber Bihari, NP

## 2020-11-11 ENCOUNTER — Ambulatory Visit (HOSPITAL_COMMUNITY): Payer: Medicare PPO

## 2020-11-11 LAB — SARS CORONAVIRUS 2 (TAT 6-24 HRS): SARS Coronavirus 2: NEGATIVE

## 2020-11-12 ENCOUNTER — Telehealth: Payer: Self-pay | Admitting: Adult Health

## 2020-11-12 ENCOUNTER — Other Ambulatory Visit: Payer: Self-pay | Admitting: Pharmacist

## 2020-11-12 DIAGNOSIS — J189 Pneumonia, unspecified organism: Secondary | ICD-10-CM

## 2020-11-12 DIAGNOSIS — J849 Interstitial pulmonary disease, unspecified: Secondary | ICD-10-CM

## 2020-11-12 MED ORDER — OFEV 150 MG PO CAPS: 1.0000 | ORAL_CAPSULE | Freq: Two times a day (BID) | ORAL | 2 refills | Status: DC

## 2020-11-12 NOTE — Telephone Encounter (Signed)
Spoke with patient regarding Evusheld and answered his questions regarding the [place] clinical trial cardiac events. I reviewed the data with him and answered his questions that although there was a slight increase in cardiac events at 22, there was no relationship established between the Evusheld and cardiac events, and that the trial participants in general were older and had pre-existing cardiac risk factors. He understands this and we discussed recommendations should he receive the injection and have cardiac symptoms at some point afterward.    He verified understanding.  He is planning on reaching out to his insurance company to understand what portion of the $300 administration fee he is responsible for.  Once he understands this, he will send me a my chart message and let me know his plans.    Wilber Bihari, NP

## 2020-11-12 NOTE — Telephone Encounter (Signed)
Patient sent email regarding Ofev    Normally by this time of the month I would have had a call from [facility] wanting to refill my OFEV scrip.  They have not called.  Has Dr. Chase Caller put a hold on refills at this point? If not, I will call them. [name]   Dr. Chase Caller please advise.

## 2020-11-12 NOTE — Telephone Encounter (Signed)
Patient had hepatic function panel on 11/05/20. LFTs wnl.  Next LFTs due 02/02/21. CBC can be drawn at f/u visit 12/18/20 with Dr. Ernst Breach refilled for 3 month supply through when next labs would be due.  Knox Saliva, PharmD, MPH Clinical Pharmacist (Rheumatology and Pulmonology)

## 2020-11-13 ENCOUNTER — Other Ambulatory Visit (INDEPENDENT_AMBULATORY_CARE_PROVIDER_SITE_OTHER): Payer: Medicare PPO

## 2020-11-13 ENCOUNTER — Other Ambulatory Visit: Payer: Self-pay

## 2020-11-13 ENCOUNTER — Ambulatory Visit (HOSPITAL_COMMUNITY): Payer: Medicare PPO

## 2020-11-13 ENCOUNTER — Ambulatory Visit (INDEPENDENT_AMBULATORY_CARE_PROVIDER_SITE_OTHER): Payer: Medicare PPO | Admitting: Internal Medicine

## 2020-11-13 DIAGNOSIS — J849 Interstitial pulmonary disease, unspecified: Secondary | ICD-10-CM | POA: Diagnosis not present

## 2020-11-13 DIAGNOSIS — Z5181 Encounter for therapeutic drug level monitoring: Secondary | ICD-10-CM | POA: Diagnosis not present

## 2020-11-13 LAB — PULMONARY FUNCTION TEST
DL/VA % pred: 83 %
DL/VA: 3.31 ml/min/mmHg/L
DLCO cor % pred: 53 %
DLCO cor: 12.08 ml/min/mmHg
DLCO unc % pred: 53 %
DLCO unc: 12.08 ml/min/mmHg
FEF 25-75 Post: 0.88 L/sec
FEF 25-75 Pre: 0.53 L/sec
FEF2575-%Change-Post: 65 %
FEF2575-%Pred-Post: 48 %
FEF2575-%Pred-Pre: 29 %
FEV1-%Change-Post: 11 %
FEV1-%Pred-Post: 60 %
FEV1-%Pred-Pre: 54 %
FEV1-Post: 1.55 L
FEV1-Pre: 1.4 L
FEV1FVC-%Change-Post: 8 %
FEV1FVC-%Pred-Pre: 86 %
FEV6-%Change-Post: 4 %
FEV6-%Pred-Post: 67 %
FEV6-%Pred-Pre: 63 %
FEV6-Post: 2.26 L
FEV6-Pre: 2.15 L
FEV6FVC-%Change-Post: 1 %
FEV6FVC-%Pred-Post: 105 %
FEV6FVC-%Pred-Pre: 104 %
FVC-%Change-Post: 2 %
FVC-%Pred-Post: 63 %
FVC-%Pred-Pre: 61 %
FVC-Post: 2.3 L
FVC-Pre: 2.24 L
Post FEV1/FVC ratio: 67 %
Post FEV6/FVC ratio: 98 %
Pre FEV1/FVC ratio: 62 %
Pre FEV6/FVC Ratio: 97 %
RV % pred: 89 %
RV: 2.19 L
TLC % pred: 68 %
TLC: 4.44 L

## 2020-11-13 LAB — HEPATIC FUNCTION PANEL
ALT: 18 U/L (ref 0–53)
AST: 23 U/L (ref 0–37)
Albumin: 3.9 g/dL (ref 3.5–5.2)
Alkaline Phosphatase: 48 U/L (ref 39–117)
Bilirubin, Direct: 0.1 mg/dL (ref 0.0–0.3)
Total Bilirubin: 0.6 mg/dL (ref 0.2–1.2)
Total Protein: 6.7 g/dL (ref 6.0–8.3)

## 2020-11-13 NOTE — Telephone Encounter (Signed)
Patient sending email about Ofev     When talking with [employer] they didn't seem to understand my jumbled yakking. Will you have the good Doc. submit a prior authorization request to [facility] so I am not on the hook for $300 "admin" fee? Will they let you know...or me...if coverage is blessed? Thanks, [name] is a program Dr. Chase Caller recommended because of my weak immune system and poor defense against COVID.Marland Kitchen The purpose is to provide a stronger defense against contracting COVID. Does that help?  Dr. Chase Caller please advise.

## 2020-11-13 NOTE — Telephone Encounter (Signed)
Appears patient is requesting a PA for the monoclonal antibody infusion to fight against Covid-19.

## 2020-11-13 NOTE — Progress Notes (Signed)
PFT done today. 

## 2020-11-14 NOTE — Telephone Encounter (Signed)
His Covid IgG was negative.  Therefore he will benefit from monoclonal antibody prophylaxis especially because of his risk status.  I do not see a problem why insurance should deny this or even asked for a co-pay.  In any event this is handled by Wilber Bihari who is in charge of the monoclonal antibody prophylaxis program [see my email to [employer] about the workflow on this regard from today]   Please do a special referral "amb ref [place]".  Any questions you can call Wilber Bihari.  Her phone number is in the email that I sent to [employer] today   Results for [name], [name]" (MRN [id number]) as of 11/14/2020 13:50  Ref. Range 11/05/2020 12:48  SARS-COV-2 IgG Latest Ref Range: Non-Reactive  0.10 Non-Reactive

## 2020-11-17 NOTE — Telephone Encounter (Signed)
MR- please see pt email regarding cost of [place] [facility] In an phone call with Mendel Ryder [phone]) I told her that [facility] indicated there would be a charge of $300.  I do not wish to proceed in that case.  P.S. I assume you know who Mendel Ryder is...  [name]

## 2020-11-18 ENCOUNTER — Ambulatory Visit (HOSPITAL_COMMUNITY): Payer: Medicare PPO

## 2020-11-18 NOTE — Telephone Encounter (Signed)
MR --pt is calling about the results of the PFT and wanted to know if this would be compared to previous PFT's.  Please advise. Thanks

## 2020-11-19 NOTE — Telephone Encounter (Signed)
I share his frustration about the $300.  And I empathized with that.  I respect his decision and will support him through this.  He just needs to be careful with social distancing and masking.  He needs to avoid clusters and monitor the Covid levels in the community and take appropriate precautions not to get Covid or for that matter any respiratory virus  This medication was intended to give at extra protection but can respect the decision based on cost

## 2020-11-19 NOTE — Telephone Encounter (Signed)
MR, this message was in regards to the PFT results. Please see below message from [facility] and advise.

## 2020-11-20 ENCOUNTER — Ambulatory Visit (HOSPITAL_COMMUNITY): Payer: Medicare PPO

## 2020-11-21 NOTE — Telephone Encounter (Signed)
I can briefly share results but to integreate the meaning of this and make a therapeutic plan we can do that during face to face visit   - From Aug 2020 - > feb 2021 July 2021 both components of PFT (FVC and DLCO) declined.  -> after that went on ofev -> since then DLCO has continued to decline but FVC is improved/stabilized suggesting that maybe the ofev has a positive benefit for him if he can tolerate it    PFT Results Latest Ref Rng & Units 11/13/2020 04/09/2020 11/09/2019 05/14/2019 07/04/2018  FVC-Pre L 2.24 1.73 2.16 2.40 2.64  FVC-Predicted Pre % 61 47 58 64 71  FVC-Post L 2.30 - 2.34 2.32 2.59  FVC-Predicted Post % 63 - 64 62 70  Pre FEV1/FVC % % 62 59 61 66 71  Post FEV1/FCV % % 67 - 65 66 72  FEV1-Pre L 1.40 1.02 1.31 1.57 1.87  FEV1-Predicted Pre % 54 39 49 59 70  FEV1-Post L 1.55 - 1.52 1.54 1.86  DLCO uncorrected ml/min/mmHg 12.08 13.71 14.23 13.34 14.88  DLCO UNC% % 53 60 62 58 52  DLCO corrected ml/min/mmHg 12.08 13.71 14.53 13.83 15.33  DLCO COR %Predicted % 53 60 64 60 54  DLVA Predicted % 83 98 92 84 78  TLC L 4.44 - 4.15 4.47 5.26  TLC % Predicted % 68 - 64 69 81  RV % Predicted % 89 - 76 89 109

## 2020-11-25 ENCOUNTER — Ambulatory Visit (HOSPITAL_COMMUNITY): Payer: Medicare PPO

## 2020-11-27 ENCOUNTER — Ambulatory Visit (HOSPITAL_COMMUNITY): Payer: Medicare PPO

## 2020-12-02 ENCOUNTER — Ambulatory Visit (HOSPITAL_COMMUNITY): Payer: Medicare PPO

## 2020-12-03 NOTE — Telephone Encounter (Signed)
GI issues likely preceipitaed by ofev  Plan - he can try OTC prilosec 32m daily on empty stomach

## 2020-12-04 ENCOUNTER — Ambulatory Visit (HOSPITAL_COMMUNITY): Payer: Medicare PPO

## 2020-12-09 ENCOUNTER — Ambulatory Visit (HOSPITAL_COMMUNITY): Payer: Medicare PPO

## 2020-12-11 ENCOUNTER — Ambulatory Visit (HOSPITAL_COMMUNITY): Payer: Medicare PPO

## 2020-12-16 ENCOUNTER — Ambulatory Visit (HOSPITAL_COMMUNITY): Payer: Medicare PPO

## 2020-12-18 ENCOUNTER — Encounter: Payer: Self-pay | Admitting: Internal Medicine

## 2020-12-18 ENCOUNTER — Ambulatory Visit: Payer: Medicare PPO | Admitting: Internal Medicine

## 2020-12-18 ENCOUNTER — Other Ambulatory Visit: Payer: Self-pay

## 2020-12-18 ENCOUNTER — Ambulatory Visit (HOSPITAL_COMMUNITY): Payer: Medicare PPO

## 2020-12-18 VITALS — BP 118/76 | HR 80 | Temp 97.1°F | Ht 67.0 in | Wt 237.8 lb

## 2020-12-18 DIAGNOSIS — J849 Interstitial pulmonary disease, unspecified: Secondary | ICD-10-CM

## 2020-12-18 DIAGNOSIS — Z5181 Encounter for therapeutic drug level monitoring: Secondary | ICD-10-CM | POA: Diagnosis not present

## 2020-12-18 NOTE — Progress Notes (Signed)
xxxxxxxxxxxxxxxxxxxxxxxxx Problem List: RA ILD, OSA Started Ofev. 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  Has been on chronic methotrexate and folic acid since at least early 2000's.  He has been tried on Biologics and Plaquenil in the past.  Follows with Dr. Trudie Reed, rheumatology.  Pets: No pets Occupation: Retired Hydrologist professor Exposures: No known exposures.  No mold, hot tub, Jacuzzi, down pillows or comforter Smoking history: 80-pack-year smoker.  Quit in 2015 Travel history: Grew up in [facility].  Previously lived in [place], [place].  No significant recent travel Relevant family history: No significant family history of lung disease  Interim history: Continues on Ofev since August 2021 Is tolerating it well with no issues Breathing is stable.   Start Remicade for treatment of rheumatoid arthritis per Dr. Trudie Reed   Data Reviewed: Imaging: High-resolution CT 06/29/2018- Moderate emphysema. Calcified granulomas.  Subcentimeter pulmonary nodule.  Subpleural reticulation, mild traction bronchiectasis with basal gradient.  Right greater than left.  No honeycombing.  Slightly worse compared to previous CT scans. Probable UIP.  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  No significant change compared to 2019.  Enlarged PA.  Stable pulmonary nodule I have reviewed the images personally.  PFTs: 05/14/2019 FVC 2.32 [62%], FEV1 1.54 [58%], F/F 66, TLC 4.47 [69%], DLCO 13.34 [58%]  04/09/2020 FVC 1.73 (47%), FEV1 1.02 [39%], F/F 59, DLCO 13.71 [60%] Severe obstruction, moderate diffusion defect  Labs: CBC 04/06/2019-WBC 10.1, eos 0.8%, absolute eosinophil count 81   N-terminal proBNP 04/30/2020-213  Sleep PSG 04/25/2000.  Severe sleep apnea, AHI 78/hr.  CPAP download 06/29/2020-100%  compliance with residual AHI of 0.9  Cardiac: Echocardiogram 05/15/2020-RVSP is mildly reduced, normal PA systolic pressure of 32, LVEF 60-65% with mild LVH  Cardiac cath 05/27/2020-normal filling pressures, 50 to 60% mild LAD stenosis, mild pulmonary hypertension with PVR 3.2 PA 44/11, mean 29 PCWP mean 11   Assessment:  RA ILD CT scan reviewed with probable UIP pattern.  CT shows stable fibrosis stable though there has been a deterioration in clinical status and PFTs Given progression he has been started on Ofev which he is tolerating well Check LFTs today  He has been referred to pulmonary rehab and is awaiting a call back.  Will make referral again  Enlarged pulmonary artery Underwent right heart cath with minimal elevation in PVR.  Does not need Tyvaso. BNP is normal.  Coronary atherosclerosis Noted on CT scan Had a heart catheterization with 50% LAD stenosis which is being managed medically.  COPD Continue Breo, Spiriva  OSA CPAP download reviewed with good compliance and response to therapy  Health maintenance Up-to-date with flu and pneumonia vaccine at primary care Up-to-date with COVID-19 vaccine  Plan/Recommendations: - Check hepatic panel - Follow-up in 2 months  Marshell Garfinkel MD [facility] 08/26/2020, 11:43 AM       OV 11/06/2020  Subjective:  [name], male , DOB: 1941/04/11 , age 80 y.o. , MRN: [id number] , ADDRESS: [address] [place] [address] PCP Javier Glazier, MD Patient Care Team: Javier Glazier, MD as PCP - General (Internal Medicine) Gavin Pound, MD as Consulting Physician (Rheumatology)  This Provider for this visit: Treatment Team:  Attending Provider: Brand Males, MD    11/06/2020 -   Chief Complaint  Patient presents with  .  Follow-up    Pt states he is about the same since last visit. Pt has been using his oxygen more frequently due to SOB.    #RA-ILD - Prob UIP. Prog phenotype  - He has history of rheumatoid arthritis for many years.  Has been on chronic methotrexate and folic acid since at least early 2000's.  He has been tried on Biologics and Plaquenil in the past.  with Dr. Trudie Reed, rheumatology.  -Remicade every 8 weeks current as of September 2021  -On nintedanib since August 2021  #Associatd Emphysema #Severe OSA    - PSG 04/25/2000.  Severe sleep apnea, AHI 78/hr.  - CPAP download 06/29/2020-100% compliance with residual AHI of 0.9 #Enlarged pulmonary artery on the CT scan  -  Cardiac cath 05/27/2020-normal filling pressures, 50 to 60% mild LAD stenosis, mild pulmonary hypertension with PVR 3.2 PA 44/11, mean 29, PCWP mean 11 ##Chronic respiratory failure due to above #Multiple medical problems including back pain  #Transfer of care from Dr. Linde Gillis Dr. Chase Caller at the ILD center 11/05/20  HPI [name] 80 y.o. -transfer of care. He has interstitial lung disease. I'm meeting him for the first time. He presents with his wife. He has multiple issues that are medical. Is significantly limited in his mobility. He uses a walker. Is because of his rheumatoid arthritis and back issues. At this point in time he is transferred his ILD care to see Dr. Chase Caller myself. He has been on nintedanib since August of 2021. His wife subjectively feels it has helped him but then she says it is because she is an optimistic person. Patient is not sure that it has helped him. He feels that it is an inconvenience and it is causing some side effects described in the symptom score below. He is also not sure it is beneficial. He he and I discussed various studies showing the efficacy of nintedanib. Overall consensus is that best describes an allergy it acts as a hand brake in the number needed to treat and IPF at least is 1 to 6. However he does admit that he has progressive ILD as documented by the pulmonary function testing below. We did  discuss that benefit is to be weighed against the quality of life and goals of care. He is appreciative of that. He was supposed to have pulmonary function test for this visit based on instructions again to the office but for some unclear reason this has not happened.     SYMPTOM SCALE - ILD 11/06/2020   O2 use yes  Shortness of Breath 0 -> 5 scale with 5 being worst (score 6 If unable to do)  At rest 1.5 (4 per wife)  Simple tasks - showers, clothes change, eating, shaving 3.5  (5 per wife)  Household (dishes, doing bed, laundry) 6  Shopping 3  Walking level at own pace 4.5 (5 per wife)  Walking up Stairs 6  Total (30-36) Dyspnea Score 24.5  How bad is your cough? Varies usually frequent  How bad is your fatigue bad  How bad is nausea vries  How bad is vomiting?  na  How bad is diarrhea? na  How bad is anxiety? low  How bad is depression Medium and increasing        PFT  PFT Results Latest Ref Rng & Units 04/09/2020 11/09/2019 05/14/2019 07/04/2018  FVC-Pre L 1.73 2.16 2.40 2.64  FVC-Predicted Pre % 47 58 64 71  FVC-Post L - 2.34 2.32 2.59  FVC-Predicted  Post % - 64 62 70  Pre FEV1/FVC % % 59 61 66 71  Post FEV1/FCV % % - 65 66 72  FEV1-Pre L 1.02 1.31 1.57 1.87  FEV1-Predicted Pre % 39 49 59 70  FEV1-Post L - 1.52 1.54 1.86  DLCO uncorrected ml/min/mmHg 13.71 14.23 13.34 14.88  DLCO UNC% % 60 62 58 52  DLCO corrected ml/min/mmHg 13.71 14.53 13.83 15.33  DLCO COR %Predicted % 60 64 60 54  DLVA Predicted % 98 92 84 78  TLC L - 4.15 4.47 5.26  TLC % Predicted % - 64 69 81  RV % Predicted % - 76 89 109   CT chest high resolution   IMPRESSION: 1. The appearance of the lungs is stable compared to prior studies, with a spectrum of findings that is once again categorized as probable usual interstitial pneumonia (UIP) per current ATS guidelines. 2. There is also mild diffuse bronchial wall thickening with mild centrilobular and paraseptal emphysema; imaging findings  suggestive of concurrent COPD. 3. Dilatation of the pulmonic trunk (3.9 cm in diameter), concerning for associated pulmonary arterial hypertension. 4. Aortic atherosclerosis, in addition to left main and 3 vessel coronary artery disease. Assessment for potential risk factor modification, dietary therapy or pharmacologic therapy may be warranted, if clinically indicated. 5. There are severe calcifications of the aortic valve and mitral valve/annulus. Echocardiographic correlation for evaluation of potential valvular dysfunction may be warranted if clinically indicated.  Aortic Atherosclerosis (ICD10-I70.0) and Emphysema (ICD10-J43.9).   Electronically Signed   By: Vinnie Langton M.D.   On: 07/31/2020 11:59     OV 12/18/2020  Subjective:  [name], male , DOB: 1941/06/21 , age 80 y.o. , MRN: [id number] , ADDRESS: [address] [place] [address] PCP Javier Glazier, MD Patient Care Team: Javier Glazier, MD as PCP - General (Internal Medicine) Gavin Pound, MD as Consulting Physician (Rheumatology)  This Provider for this visit: Treatment Team:  Attending Provider: Brand Males, MD    12/18/2020 -   Chief Complaint  Patient presents with  . Follow-up    Pt states cough hasn't improved, mostly nonproductive but occasionally productive with grayish-brown phlegm. He is also still having SOB with activity.     #RA-ILD - Prob UIP. Prog phenotype  - He has history of rheumatoid arthritis for many years.  Has been on chronic methotrexate and folic acid since at least early 2000's.  He has been tried on Biologics and Plaquenil in the past.  with Dr. Trudie Reed, rheumatology.  -Remicade every 8 weeks current as of September 2021  -On nintedanib since August 2021  #Associatd Emphysema  #Severe OSA    - PSG 04/25/2000.  Severe sleep apnea, AHI 78/hr.  - CPAP download 06/29/2020-100% compliance with residual AHI of 0.9  #Enlarged  pulmonary artery on the CT scan  -  Cardiac cath 05/27/2020-normal filling pressures, 50 to 60% mild LAD stenosis, mild pulmonary hypertension with PVR 3.2. PA 44/11, mean 29, PCWP mean 11  ##Chronic respiratory failure due to above  #Multiple medical problems including back pain  #Transfer of care from Dr. Vaughan Browner to Dr. Chase Caller at the ILD center 11/05/20   HPI [name] 79 y.o. -returns for follow-up.  He presents with his wife.  On this visit he is here to discuss about whether he wants to continue nintedanib or stop it.  He had pulmonary function testing.  We went over these results.  It appears that  after he started nintedanib his DLCO is continued to decline but his FVC and total lung capacity seems to have stabilized.  There is a bit perplexed by this lack of congruency.  We explained the possibility being that perhaps the fibrosis worsening has slowed down versus his pulmonary hypertension has gotten worse.  Of note in summer 2021 he did have elevated pulmonary artery pressures.  Based on that he meets inclusion exclusion criteria [other than mobility] for inhaled treprostinil.  We discussed this in detail.  Went over the side effects.  He wants to read product brochure and reflect on it.  Showed a material on YouTube.  Also showed him the material on the Internet.  Also given chronic pressure.  Based on his reflections he will contact us.  Of note his Covid IgG was negative.  Recommend monoclonal antibody prophylaxis but the co-pay for this was $300 so he deferred this.    Include in patients with ILD   - Right heart cath :  PVR > 3, PCWP </= 15, Pmap >/=  25 - Exclude - LVEF < 40%,  - Baseline o2 > 10L -   SYMPTOM SCALE - ILD 11/06/2020  12/18/2020   O2 use yes yes  Shortness of Breath 0 -> 5 scale with 5 being worst (score 6 If unable to do)   At rest 1.5 (4 per wife) 1.5  Simple tasks - showers, clothes change, eating, shaving 3.5  (5 per wife) 3.5  Household (dishes,  doing bed, laundry) 6 4  Shopping 3 3.5  Walking level at own pace 4.5 (5 per wife) 3.5  Walking up Stairs 6 5  Total (30-36) Dyspnea Score 24.5 19  How bad is your cough? Varies usually frequent 3  How bad is your fatigue bad 3.5  How bad is nausea vries 1.5  How bad is vomiting?  na na  How bad is diarrhea? na na  How bad is anxiety? low na  How bad is depression Medium and increasing 3.5       PFT  PFT Results Latest Ref Rng & Units 11/13/2020 04/09/2020 11/09/2019 05/14/2019 07/04/2018  FVC-Pre L 2.24 1.73 2.16 2.40 2.64  FVC-Predicted Pre % 61 47 58 64 71  FVC-Post L 2.30 - 2.34 2.32 2.59  FVC-Predicted Post % 63 - 64 62 70  Pre FEV1/FVC % % 62 59 61 66 71  Post FEV1/FCV % % 67 - 65 66 72  FEV1-Pre L 1.40 1.02 1.31 1.57 1.87  FEV1-Predicted Pre % 54 39 49 59 70  FEV1-Post L 1.55 - 1.52 1.54 1.86  DLCO uncorrected ml/min/mmHg 12.08 13.71 14.23 13.34 14.88  DLCO UNC% % 53 60 62 58 52  DLCO corrected ml/min/mmHg 12.08 13.71 14.53 13.83 15.33  DLCO COR %Predicted % 53 60 64 60 54  DLVA Predicted % 83 98 92 84 78  TLC L 4.44 - 4.15 4.47 5.26  TLC % Predicted % 68 - 64 69 81  RV % Predicted % 89 - 76 89 109       has a past medical history of Chronic lower back pain, Complication of anesthesia (01/2017), COPD (chronic obstructive pulmonary disease) (HCC), Degenerative scoliosis in adult patient, Depression, Dyspnea, GERD (gastroesophageal reflux disease), H/O seasonal allergies, High cholesterol, History of blood transfusion, History of kidney stones, Hypertension, 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   reports that  he quit smoking about 6 years ago. His smoking use included cigarettes. He started smoking about 59 years ago. He has a 81.00 pack-year smoking history. He has never used smokeless tobacco.  Past  Surgical History:  Procedure Laterality Date  . BACK SURGERY    . COLONOSCOPY W/ POLYPECTOMY    . FRACTURE SURGERY    . HERNIA REPAIR    . HIP ARTHROPLASTY Left 04/2016  . INGUINAL HERNIA REPAIR Left 1986  . JOINT REPLACEMENT    . LUMBAR WOUND DEBRIDEMENT N/A 04/18/2017   Procedure: REPAIR OF LUMBAR PSEUDOMENINGOCELE;  Surgeon: Newman Pies, MD;  Location: [facility];  Service: Neurosurgery;  Laterality: N/A;  REAIR OF LUMBAR PSEUDOMENINGOCELE  . LUMBAR WOUND DEBRIDEMENT N/A 05/05/2017   Procedure: I&D Lumbar Wound;  Surgeon: Newman Pies, MD;  Location: [facility];  Service: Neurosurgery;  Laterality: N/A;  . MAXIMUM ACCESS (MAS)POSTERIOR LUMBAR INTERBODY FUSION (PLIF) 3 LEVEL  02/17/2017   [name] 02/17/2017  . OPEN SURGICAL REPAIR OF GLUTEAL TENDON Right 11/02/2017   Procedure: Right hip gluteal tendon repair;  Surgeon: Gaynelle Arabian, MD;  Location: [facility];  Service: Orthopedics;  Laterality: Right;  . POSTERIOR LAMINECTOMY / DECOMPRESSION LUMBAR SPINE  2010  . PROSTATE BIOPSY    . PROSTATE BIOPSY  <2013 X 3  . RIGHT HEART CATH AND CORONARY ANGIOGRAPHY N/A 05/27/2020   Procedure: RIGHT HEART CATH AND CORONARY ANGIOGRAPHY;  Surgeon: Larey Dresser, MD;  Location: [facility];  Service: Cardiovascular;  Laterality: N/A;  . SPLENECTOMY  1955  . TONSILLECTOMY    . TOTAL HIP ARTHROPLASTY Right 10/2015  . UMBILICAL HERNIA REPAIR  05/2016    Allergies  Allergen Reactions  . Sulfa Antibiotics Shortness Of Breath  . Sulfamethoxazole-Trimethoprim Shortness Of Breath  . Sulfasalazine Shortness Of Breath  . Other Other (See Comments)  . Varenicline Other (See Comments)     Strange thoughts SUICIDAL  . Lisinopril Cough    Immunization History  Administered Date(s) Administered  . 19-influenza Whole 07/18/2012, 06/14/2014, 08/12/2015, 06/03/2016  . HiB (PRP-T) 08/23/2017  . Influenza, High Dose Seasonal PF 06/12/2018, 07/06/2019, 06/30/2020  . Influenza,inj,Quad PF,6+ Mos  06/28/2017  . Influenza-Unspecified 07/18/2012, 06/14/2014, 08/12/2015, 06/03/2016, 06/28/2017  . Meningococcal B, OMV 08/23/2017, 09/21/2017  . Meningococcal Conjugate 08/23/2017  . Meningococcal Mcv4o 08/23/2017, 10/18/2017  . Moderna Sars-Covid-2 Vaccination 10/11/2019, 11/06/2019, 07/30/2020  . Pneumococcal Conjugate-13 11/28/2013, 11/28/2013  . Pneumococcal Polysaccharide-23 12/26/2005, 09/13/2012  . Pneumococcal-Unspecified 12/26/2005, 09/13/2012  . Tdap 11/28/2013, 11/28/2013  . Zoster 06/06/2012, 06/06/2012    Family History  Problem Relation Age of Onset  . Cancer Mother   . Heart disease Father   . Stroke Father      Current Outpatient Medications:  .  albuterol (PROVENTIL HFA;VENTOLIN HFA) 108 (90 Base) MCG/ACT inhaler, Inhale 2 puffs into the lungs every 6 (six) hours as needed for wheezing or shortness of breath., Disp: , Rfl:  .  aspirin EC 81 MG tablet, Take 81 mg by mouth daily. Swallow whole., Disp: , Rfl:  .  BREO ELLIPTA 100-25 MCG/INH AEPB, INHALE 1 PUFF INTO LUNGS DAILY (Patient taking differently: Inhale 1 puff into the lungs daily.), Disp: 180 each, Rfl: 1 .  celecoxib (CELEBREX) 100 MG capsule, Take 100 mg by mouth 2 (two) times daily., Disp: , Rfl:  .  cetirizine (ZYRTEC) 10 MG tablet, Take 10 mg by mouth daily as needed for allergies. , Disp: , Rfl:  .  DULoxetine (CYMBALTA) 60 MG capsule, Take 60 mg by mouth  every evening. , Disp: , Rfl:  .  finasteride (PROSCAR) 5 MG tablet, Take 5 mg by mouth at bedtime. , Disp: , Rfl:  .  fluticasone (FLONASE) 50 MCG/ACT nasal spray, Place 2 sprays into both nostrils daily as needed for allergies. , Disp: , Rfl:  .  folic acid (FOLVITE) 1 MG tablet, Take 1 mg by mouth daily., Disp: , Rfl:  .  furosemide (LASIX) 20 MG tablet, Take 1 tablet (65m total) daily for next 7 days for fluid management (Patient taking differently: Take 20 mg by mouth daily.), Disp: 7 tablet, Rfl: 0 .  gabapentin (NEURONTIN) 300 MG capsule, Take  300-800 mg by mouth See admin instructions. Take 300 mg in the morning 600 mg in the afternoon and 600-800 at bedtime, Disp: , Rfl:  .  lansoprazole (PREVACID) 30 MG capsule, Take 30 mg by mouth daily at 12 noon., Disp: , Rfl:  .  losartan (COZAAR) 25 MG tablet, TAKE ONE (1) TABLET BY MOUTH EACH DAY (Patient taking differently: Take 25 mg by mouth daily.), Disp: 90 tablet, Rfl: 1 .  metFORMIN (GLUCOPHAGE) 500 MG tablet, Take 500 mg by mouth daily. , Disp: , Rfl:  .  methotrexate (RHEUMATREX) 2.5 MG tablet, Take 2.5 mg by mouth once a week. Caution:Chemotherapy. Protect from light., Disp: , Rfl:  .  metoprolol succinate (TOPROL-XL) 50 MG 24 hr tablet, TAKE ONE TABLET BY MOUTH TWICE DAILY WITH OR IMMEDIATELY FOLLOWING A MEAL, Disp: 180 tablet, Rfl: 3 .  Multiple Vitamins-Minerals (CENTRUM SILVER 50+MEN) TABS, Take 1 tablet by mouth daily., Disp: , Rfl:  .  Nintedanib (OFEV) 150 MG CAPS, Take 1 capsule (150 mg total) by mouth 2 (two) times daily., Disp: 60 capsule, Rfl: 2 .  predniSONE (DELTASONE) 5 MG tablet, Take 5 mg by mouth daily. , Disp: , Rfl:  .  simvastatin (ZOCOR) 40 MG tablet, Take 40 mg by mouth at bedtime. , Disp: , Rfl:  .  sodium chloride (OCEAN) 0.65 % SOLN nasal spray, Place 1 spray into both nostrils daily as needed for congestion. , Disp: , Rfl:  .  SPIRIVA RESPIMAT 2.5 MCG/ACT AERS, INHALE TWO (2) PUFFS INTO THE LUNGS ONCE DAILY (Patient taking differently: Take 2 puffs by mouth daily.), Disp: 12 g, Rfl: 3 .  tamsulosin (FLOMAX) 0.4 MG CAPS capsule, Take 0.4 mg by mouth at bedtime. , Disp: , Rfl:  .  traMADol (ULTRAM) 50 MG tablet, Take 50-100 mg by mouth every 6 (six) hours as needed for moderate pain or severe pain. , Disp: , Rfl:  .  traZODone (DESYREL) 50 MG tablet, Take 50 mg by mouth at bedtime., Disp: , Rfl:  .  calcium carbonate (TUMS EX) 750 MG chewable tablet, Chew 1 tablet by mouth as needed for heartburn. (Patient not taking: Reported on 12/18/2020), Disp: , Rfl:  .   ibuprofen (ADVIL) 200 MG tablet, Take 600-800 mg by mouth daily as needed for mild pain or moderate pain.  (Patient not taking: Reported on 12/18/2020), Disp: , Rfl:       Objective:   Vitals:   12/18/20 1145  BP: 118/76  Pulse: 80  Temp: (!) 97.1 F (36.2 C)  TempSrc: Temporal  SpO2: 92%  Weight: 237 lb 12.8 oz (107.9 kg)  Height: _0  (1.702 m)    Estimated body mass index is 37.24 kg/m as calculated from the following:   Height as of this encounter: _1  (1.702 m).   Weight as of this encounter: 237 lb  12.8 oz (107.9 kg).  _0 @  Filed Weights   12/18/20 1145  Weight: 237 lb 12.8 oz (107.9 kg)     Physical Exam  General: No distress. obese Neuro: Alert and Oriented x 3. GCS 15. Speech normal Psych: Pleasant Resp:  Barrel Chest - no.  Wheeze - no, Crackles - yes at base, No overt respiratory distress CVS: Normal heart sounds. Murmurs - no Ext: Stigmata of Connective Tissue Disease - RA HEENT: Normal upper airway. PEERL +. No post nasal drip        Assessment:       ICD-10-CM   1. ILD (interstitial lung disease) (HCC)  J84.9 Hepatic function panel  2. Therapeutic drug monitoring  Z51.81 Hepatic function panel       Plan:     Patient Instructions  Interstitial lung disease due to connective tissue disease ([facility]) ILD (interstitial lung disease) ([facility]) Therapeutic drug monitoring Rheumatoid arthritis involving multiple sites with positive rheumatoid factor ([facility])  - I Think Ofev seems to have stabilizing effect for you as of now - we discussed and took shared decision making to continue nintedanib   Plan -Check liver function test  In 3 months --Consent and enroll and ILD-pro registry study -Continue nintedanib 150 mg twice daily -Continue oxygen  WHO group 3 pulmonary hypertension   -Diagnosed August 2021 -Inhaled treprostinil indicated for quality of life outcome  Plan -Product brochure for inhaled treprostinil given for you to read,  self reflect and evaluate  -You will let us know you interested in starting this  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 Chase Caller in a 30-minute visit     SIGNATURE    Dr. Brand Males, M.D., F.C.C.P,  Pulmonary and Critical Care Medicine Staff Physician, [place] Director - Interstitial Lung Disease  Program  Pulmonary [facility] [place], [place], [address]  Pager: [phone], If no answer or between  15:00h - 7:00h: call [phone] Telephone: [phone]  12:35 PM 12/18/2020

## 2020-12-18 NOTE — Research (Signed)
Title: Chronic Fibrosing Interstitial Lung Disease with Progressive Phenotype Prospective Outcomes (ILD-PRO) Registry   Protocol #: IPF-PRO-SUB, Clinical Trials # [id number], Sponsor: [employer]  Protocol Version Amendment 4 dated 12Sep2019  and confirmed current on 12/18/2020 Consent Version for today's visit date of 12/18/2020  is Version 31 Aug 2018  Objectives:  Marland Kitchen Describe current approaches to diagnosis and treatment of chronic fibrosing ILDs with progressive phenotype  . Describe the natural history of chronic fibrosing ILDs with progressive phenotype  . Assess quality of life from self-administered participant reported questionnaires for each disease group  . Describe participant interactions with the healthcare system, describe treatment practices across multiple institutions for each disease group  . Collect biological samples linked to well characterized chronic fibrosing ILDs with progressive phenotype to identify disease biomarkers  . Collect data and biological samples that will support future research studies.                                            Key Inclusion Criteria: Willing and able to provide informed consent  Age ? 30 years  Diagnosis of a non-IPF ILD of any duration, including, but not limited to Idiopathic Non-Specific Interstitial Pneumonia (INSIP), Unclassifiable Idiopathic Interstitial Pneumonias (IIPs), Interstitial Pneumonia with Autoimmune Features (IPAF), Autoimmune ILDs such as Rheumatoid Arthritis (RA-ILD) and Systemic Sclerosis (SSC-ILD), Chronic Hypersensitivity Pneumonitis (HP), Sarcoidosis or Exposure-related ILDs such as asbestosis.  Chronic fibrosing ILD defined by reticular abnormality with traction bronchiectasis with or without honeycombing confirmed by chest HRCT scan and/or lung biopsy.  Progressive phenotype as defined by fulfilling at least one of the criteria below of fibrotic changes (progression set point) within the  last 24 months regardless of treatment considered appropriate in individual ILDs:  . decline in FVC % predicted (% pred) based on >10% relative decline  . decline in FVC % pred based on ? 5 - <10% relative decline in FVC combined with worsening of respiratory symptoms as assessed by the site investigator  . decline in FVC % pred based on ? 5 - <10% relative decline in FVC combined with increasing extent of fibrotic changes on chest imaging (HRCT scan) as assessed by the site investigator  . decline in DLCO % pred based on ? 10% relative decline  . worsening of respiratory symptoms as well as increasing extent of fibrotic changes on chest imaging (HRCT scan) as assessed by the site investigator independent of FVC change.    Key Exclusion Criteria: Malignancy, treated or untreated, other than skin or early stage prostate cancer, within the past 5 years  Currently listed for lung transplantation at the time of enrollment  Currently enrolled in a clinical trial at the time of enrollment in this registry    Clinical Research Coordinator / Research RN note : This visit for Subject [name] with DOB: 22-Dec-1940 on 12/18/2020 for the above protocol is Visit/Encounter #Enrollment/Baseline and is for purpose of research.   The consent for this encounter is under Protocol Version Amendment 4(01 Sep 2019), Consent Version 31 Aug 2018  and is currently IRB approved.   Duringthis visiton3/24/2022,the subject met with me in the office to discuss the protocol ICF and sign. The study coordinator went over the entire ICF with the subject and the subject was given ample time to read and review the ICF. After review of the ICF,all questions were answered to the  subject's satisfaction.Dr. Chase Caller, PI,reviewed the ICF with the patient, explained the purpose of research, and asked if there were any additional questions. He stated that thestudycoordinatorand doctorhave explained the study to him  thoroughly and stated he had no additional questions. The subject,study coordinator,and PI signed the ICF and the subject was given a signed copy of the ICF for his records. After consent all study related procedures were conducted as per theabovestated protocol. For additional information on today's visit,please refer to subject's paper source binder   Signed by [place] Assistant [facility]  [place], [place] 1:55 PM 12/18/2020

## 2020-12-18 NOTE — Patient Instructions (Addendum)
Interstitial lung disease due to connective tissue disease (HCC) ILD (interstitial lung disease) ([place]) Therapeutic drug monitoring Rheumatoid arthritis involving multiple sites with positive rheumatoid factor ([place])  - I Think Ofev seems to have stabilizing effect for you as of now - we discussed and took shared decision making to continue nintedanib   Plan -Check liver function test  In 3 months --Consent and enroll and ILD-pro registry study -Continue nintedanib 150 mg twice daily -Continue oxygen  WHO group 3 pulmonary hypertension   -Diagnosed August 2021 -Inhaled treprostinil indicated for quality of life outcome  Plan -Product brochure for inhaled treprostinil given for you to read, self reflect and evaluate  -You will let us know you interested in starting this  Immunosuppression due to drug therapy ([place]) Vaccine counseling Covid IgG -2022  Monoclonal antibody prophylaxis recommended but held off due to cost concerns  Plan -Continue with low risk activities against COVID  Follow-up -Return in 3 months to see Dr. Chase Caller in a 30-minute visit

## 2020-12-22 NOTE — Telephone Encounter (Signed)
Please confirm with patient this is about TYVASO and if so document it and close note

## 2020-12-22 NOTE — Telephone Encounter (Signed)
Please see mychart message form patient     Dr. Chase Caller: After reading/examining the details about this drug,doing some online examination, reviewing the limited test results, and considering the lack of a true comparison group...plus the nuisance of all the treatments.Marland KitchenMarland KitchenI do not wish to use this drug. Thank you for suggesting this option but I think enough is about enough at this stage and in my mental condition. [name]

## 2020-12-23 NOTE — Telephone Encounter (Signed)
Yes, pt said this was about Tyvaso.

## 2020-12-25 NOTE — Telephone Encounter (Signed)
Ok no need for tyvaso

## 2021-01-01 ENCOUNTER — Other Ambulatory Visit: Payer: Self-pay

## 2021-01-16 ENCOUNTER — Other Ambulatory Visit: Payer: Self-pay | Admitting: Pulmonary Disease

## 2021-01-27 ENCOUNTER — Ambulatory Visit: Payer: Medicare PPO | Attending: Internal Medicine

## 2021-01-27 DIAGNOSIS — Z23 Encounter for immunization: Secondary | ICD-10-CM

## 2021-01-27 NOTE — Progress Notes (Signed)
   Covid-19 Vaccination Clinic  Name:  DEONTEZ KLINKE    MRN: [id number] DOB: Apr 11, 1941  01/27/2021  Mr. Graca was observed post Covid-19 immunization for 15 minutes without incident. He was provided with Vaccine Information Sheet and instruction to access the V-Safe system.   Mr. Vo was instructed to call 911 with any severe reactions post vaccine: Marland Kitchen Difficulty breathing  . Swelling of face and throat  . A fast heartbeat  . A bad rash all over body  . Dizziness and weakness   Immunizations Administered    Name Date Dose VIS Date Route   Moderna Covid-19 Booster Vaccine 01/27/2021  1:06 PM 0.25 mL 07/16/2020 Intramuscular   Manufacturer: Moderna   Lot: [id number]   [place]: 27517-001-74

## 2021-01-30 ENCOUNTER — Other Ambulatory Visit: Payer: Self-pay | Admitting: Cardiology

## 2021-01-31 ENCOUNTER — Other Ambulatory Visit: Payer: Self-pay | Admitting: Pulmonary Disease

## 2021-02-03 ENCOUNTER — Other Ambulatory Visit (HOSPITAL_BASED_OUTPATIENT_CLINIC_OR_DEPARTMENT_OTHER): Payer: Self-pay

## 2021-02-03 MED ORDER — MODERNA COVID-19 VACCINE 100 MCG/0.5ML IM SUSP
INTRAMUSCULAR | 0 refills | Status: DC
  Filled 2021-02-03: qty 0.25, 1d supply, fill #0

## 2021-02-05 ENCOUNTER — Telehealth: Payer: Self-pay | Admitting: Internal Medicine

## 2021-02-05 ENCOUNTER — Telehealth: Payer: Self-pay | Admitting: Pulmonary Disease

## 2021-02-05 NOTE — Telephone Encounter (Signed)
Form has been received. mychart message sent by pt letting him know that we received the form and that we would get it faxed to [facility] at his request.nothing further needed.

## 2021-02-05 NOTE — Telephone Encounter (Signed)
Called and spoke with Marya Amsler pharmacist at [facility] and gave him a verbal order to refill Ofev. Nothing further needed at this time.

## 2021-02-05 NOTE — Telephone Encounter (Signed)
ATC, left VM. Will route to [employer] as she is working with MR and does his box.  Raquel Sarna, please advise if you have seen this form? Thanks!

## 2021-04-16 ENCOUNTER — Other Ambulatory Visit: Payer: Self-pay | Admitting: Pulmonary Disease

## 2021-04-24 NOTE — Telephone Encounter (Signed)
There is notthing dramatic/major news with ofev. I can certainly to look at pubmed for any smaller level news

## 2021-04-30 ENCOUNTER — Other Ambulatory Visit: Payer: Self-pay | Admitting: Cardiology

## 2021-04-30 ENCOUNTER — Telehealth: Payer: Self-pay | Admitting: Internal Medicine

## 2021-04-30 MED ORDER — LOSARTAN POTASSIUM 25 MG PO TABS: ORAL_TABLET | ORAL | 0 refills | Status: AC

## 2021-04-30 NOTE — Telephone Encounter (Signed)
*  STAT* If patient is at the pharmacy, call can be transferred to refill team.   1. Which medications need to be refilled? (please list name of each medication and dose if known)  losartan (COZAAR) 25 MG tablet  2. Which pharmacy/location (including street and city if local pharmacy) is medication to be sent to?  [facility] - [place], [place] - [address]  3. Do they need a 30 day or 90 day supply?  90 day supply

## 2021-04-30 NOTE — Telephone Encounter (Signed)
Losartan refill sent to [facility], per pt request.  Pt has an appointment coming up in September with Dr. Rayann Heman.

## 2021-05-04 ENCOUNTER — Other Ambulatory Visit: Payer: Self-pay | Admitting: Internal Medicine

## 2021-05-04 DIAGNOSIS — J849 Interstitial pulmonary disease, unspecified: Secondary | ICD-10-CM

## 2021-06-09 ENCOUNTER — Ambulatory Visit: Payer: Medicare PPO | Admitting: Internal Medicine

## 2021-06-09 ENCOUNTER — Other Ambulatory Visit: Payer: Self-pay

## 2021-06-09 ENCOUNTER — Encounter (INDEPENDENT_AMBULATORY_CARE_PROVIDER_SITE_OTHER): Payer: Medicare PPO | Admitting: *Deleted

## 2021-06-09 ENCOUNTER — Encounter: Payer: Self-pay | Admitting: Internal Medicine

## 2021-06-09 VITALS — BP 116/60 | HR 80 | Temp 98.0°F | Ht 67.0 in | Wt 223.4 lb

## 2021-06-09 DIAGNOSIS — J9611 Chronic respiratory failure with hypoxia: Secondary | ICD-10-CM

## 2021-06-09 DIAGNOSIS — G4733 Obstructive sleep apnea (adult) (pediatric): Secondary | ICD-10-CM

## 2021-06-09 DIAGNOSIS — M0579 Rheumatoid arthritis with rheumatoid factor of multiple sites without organ or systems involvement: Secondary | ICD-10-CM | POA: Diagnosis not present

## 2021-06-09 DIAGNOSIS — J849 Interstitial pulmonary disease, unspecified: Secondary | ICD-10-CM

## 2021-06-09 DIAGNOSIS — Z5181 Encounter for therapeutic drug level monitoring: Secondary | ICD-10-CM | POA: Diagnosis not present

## 2021-06-09 DIAGNOSIS — J8489 Other specified interstitial pulmonary diseases: Secondary | ICD-10-CM

## 2021-06-09 DIAGNOSIS — Z006 Encounter for examination for normal comparison and control in clinical research program: Secondary | ICD-10-CM

## 2021-06-09 DIAGNOSIS — Z9989 Dependence on other enabling machines and devices: Secondary | ICD-10-CM

## 2021-06-09 DIAGNOSIS — M359 Systemic involvement of connective tissue, unspecified: Secondary | ICD-10-CM

## 2021-06-09 DIAGNOSIS — I272 Pulmonary hypertension, unspecified: Secondary | ICD-10-CM

## 2021-06-09 LAB — HEPATIC FUNCTION PANEL
ALT: 19 U/L (ref 0–53)
AST: 24 U/L (ref 0–37)
Albumin: 3.9 g/dL (ref 3.5–5.2)
Alkaline Phosphatase: 52 U/L (ref 39–117)
Bilirubin, Direct: 0.2 mg/dL (ref 0.0–0.3)
Total Bilirubin: 0.8 mg/dL (ref 0.2–1.2)
Total Protein: 6.6 g/dL (ref 6.0–8.3)

## 2021-06-09 NOTE — Addendum Note (Signed)
Addended by: Lorretta Harp on: 06/09/2021 04:13 PM   Modules accepted: Orders

## 2021-06-09 NOTE — Research (Signed)
Title: Chronic Fibrosing Interstitial Lung Disease with Progressive Phenotype Prospective Outcomes (ILD-PRO) Registry   Protocol #: IPF-PRO-SUB, Clinical Trials # [id number], Sponsor: [employer]  Protocol Version Amendment 4 dated 12Sep2019  and confirmed current on 06/09/2021 Consent Version for today's visit date of 06/09/2021  is Version [place] IRB Approved Version 31 Aug 2018 Revised 31 Aug 2018  Objectives:  Describe current approaches to diagnosis and treatment of chronic fibrosing ILDs with progressive phenotype  Describe the natural history of chronic fibrosing ILDs with progressive phenotype  Assess quality of life from self-administered participant reported questionnaires for each disease group  Describe participant interactions with the healthcare system, describe treatment practices across multiple institutions for each disease group  Collect biological samples linked to well characterized chronic fibrosing ILDs with progressive phenotype to identify disease biomarkers  Collect data and biological samples that will support future research studies.                                            Key Inclusion Criteria: Willing and able to provide informed consent  Age ? 30 years  Diagnosis of a non-IPF ILD of any duration, including, but not limited to Idiopathic Non-Specific Interstitial Pneumonia (INSIP), Unclassifiable Idiopathic Interstitial Pneumonias (IIPs), Interstitial Pneumonia with Autoimmune Features (IPAF), Autoimmune ILDs such as Rheumatoid Arthritis (RA-ILD) and Systemic Sclerosis (SSC-ILD), Chronic Hypersensitivity Pneumonitis (HP), Sarcoidosis or Exposure-related ILDs such as asbestosis.  Chronic fibrosing ILD defined by reticular abnormality with traction bronchiectasis with or without honeycombing confirmed by chest HRCT scan and/or lung biopsy.  Progressive phenotype as defined by fulfilling at least one of the criteria below of fibrotic changes  (progression set point) within the last 24 months regardless of treatment considered appropriate in individual ILDs:  decline in FVC % predicted (% pred) based on >10% relative decline  decline in FVC % pred based on ? 5 - <10% relative decline in FVC combined with worsening of respiratory symptoms as assessed by the site investigator  decline in FVC % pred based on ? 5 - <10% relative decline in FVC combined with increasing extent of fibrotic changes on chest imaging (HRCT scan) as assessed by the site investigator  decline in DLCO % pred based on ? 10% relative decline  worsening of respiratory symptoms as well as increasing extent of fibrotic changes on chest imaging (HRCT scan) as assessed by the site investigator independent of FVC change.    Key Exclusion Criteria: Malignancy, treated or untreated, other than skin or early stage prostate cancer, within the past 5 years  Currently listed for lung transplantation at the time of enrollment  Currently enrolled in a clinical trial at the time of enrollment in this registry     [facility] [place] Coordinator note :   This visit for Subject [name] with DOB: 05-15-1941 on 06/09/2021 for the above protocol is Visit/Encounter #2  and is for purpose of research.   Subject expressed continued interest and consent in continuing as a study subject. Subject confirmed that there was no change in contact information (e.g. address, telephone, email). Subject thanked for participation in research and contribution to science.    The Subject was informed that the [facility] continues to have oversight of the subject's visits and course through relevant discussions, reviews and also specifically of this visit by routing of this note to the  PI.   During this visit on 06/09/2021, the subject completed the blood work and questionnaires per the above referenced protocol. Please refer to the subject's paper source binder for further  details.   Signed by  Lazaro Arms Clinical Research Coordinator [facility]  [place], [place] 3:49 PM 06/09/2021

## 2021-06-09 NOTE — Progress Notes (Signed)
xxxxxxxxxxxxxxxxxxxxxxxxx Problem List: RA ILD, OSA Started Ofev. 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 Whobrey is here for evaluation of interstitial lung disease He has history of rheumatoid arthritis for many years.  Has been on chronic methotrexate and folic acid since at least early 2000's.  He has been tried on Biologics and Plaquenil in the past.  Follows with Dr. Trudie Reed, rheumatology.  Pets: No pets Occupation: Retired Hydrologist professor Exposures: No known exposures.  No mold, hot tub, Jacuzzi, down pillows or comforter Smoking history: 80-pack-year smoker.  Quit in 2015 Travel history: Grew up in [facility].  Previously lived in [place], [place].  No significant recent travel Relevant family history: No significant family history of lung disease  Interim history: Continues on Ofev since August 2021 Is tolerating it well with no issues Breathing is stable.   Start Remicade for treatment of rheumatoid arthritis per Dr. Trudie Reed   Data Reviewed: Imaging: High-resolution CT 06/29/2018- Moderate emphysema. Calcified granulomas.  Subcentimeter pulmonary nodule.  Subpleural reticulation, mild traction bronchiectasis with basal gradient.  Right greater than left.  No honeycombing.  Slightly worse compared to previous CT scans. Probable UIP.  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  No significant change compared to 2019.  Enlarged PA.  Stable pulmonary nodule I have reviewed the images personally.  PFTs: 05/14/2019 FVC 2.32 [62%], FEV1 1.54 [58%], F/F 66, TLC 4.47 [69%], DLCO 13.34 [58%]  04/09/2020 FVC 1.73 (47%), FEV1 1.02 [39%], F/F 59, DLCO 13.71 [60%] Severe obstruction, moderate diffusion defect  Labs: CBC 04/06/2019-WBC 10.1, eos 0.8%, absolute eosinophil count 81   N-terminal proBNP 04/30/2020-213  Sleep PSG 04/25/2000.  Severe sleep apnea, AHI 78/hr.  CPAP download 06/29/2020-100%  compliance with residual AHI of 0.9  Cardiac: Echocardiogram 05/15/2020-RVSP is mildly reduced, normal PA systolic pressure of 32, LVEF 60-65% with mild LVH  Cardiac cath 05/27/2020-normal filling pressures, 50 to 60% mild LAD stenosis, mild pulmonary hypertension with PVR 3.2 PA 44/11, mean 29 PCWP mean 11   Assessment:  RA ILD CT scan reviewed with probable UIP pattern.  CT shows stable fibrosis stable though there has been a deterioration in clinical status and PFTs Given progression he has been started on Ofev which he is tolerating well Check LFTs today  He has been referred to pulmonary rehab and is awaiting a call back.  Will make referral again  Enlarged pulmonary artery Underwent right heart cath with minimal elevation in PVR.  Does not need Tyvaso. BNP is normal.  Coronary atherosclerosis Noted on CT scan Had a heart catheterization with 50% LAD stenosis which is being managed medically.  COPD Continue Breo, Spiriva  OSA CPAP download reviewed with good compliance and response to therapy  Health maintenance Up-to-date with flu and pneumonia vaccine at primary care Up-to-date with COVID-19 vaccine  Plan/Recommendations: - Check hepatic panel - Follow-up in 2 months  Marshell Garfinkel MD [facility] 08/26/2020, 11:43 AM       OV 11/06/2020  Subjective:  [name], male , DOB: Dec 28, 1940 , age 80 y.o. , MRN: [id number] , ADDRESS: [address] [place] [address] PCP Javier Glazier, MD Patient Care Team: Javier Glazier, MD as PCP - General (Internal Medicine) Gavin Pound, MD as Consulting Physician (Rheumatology)  This Provider for this visit: Treatment Team:  Attending Provider: Brand Males, MD    11/06/2020 -   Chief Complaint  Patient  presents with   Follow-up    Pt states he is about the same since last visit. Pt has been using his oxygen more frequently due to SOB.    #RA-ILD - Prob UIP. Prog phenotype  - He has history of rheumatoid arthritis for many years.  Has been on chronic methotrexate and folic acid since at least early 2000's.  He has been tried on Biologics and Plaquenil in the past.  with Dr. Trudie Reed, rheumatology.  -Remicade every 8 weeks current as of September 2021  -On nintedanib since August 2021  #Associatd Emphysema #Severe OSA    - PSG 04/25/2000.  Severe sleep apnea, AHI 78/hr.  - CPAP download 06/29/2020-100% compliance with residual AHI of 0.9 #Enlarged pulmonary artery on the CT scan  -  Cardiac cath 05/27/2020-normal filling pressures, 50 to 60% mild LAD stenosis, mild pulmonary hypertension with PVR 3.2 PA 44/11, mean 29, PCWP mean 11 ##Chronic respiratory failure due to above #Multiple medical problems including back pain  #Transfer of care from Dr. Linde Gillis Dr. Chase Caller at the ILD center 11/05/20  [name] 80 y.o. -transfer of care. He has interstitial lung disease. I'm meeting him for the first time. He presents with his wife. He has multiple issues that are medical. Is significantly limited in his mobility. He uses a walker. Is because of his rheumatoid arthritis and back issues. At this point in time he is transferred his ILD care to see Dr. Chase Caller myself. He has been on nintedanib since August of 2021. His wife subjectively feels it has helped him but then she says it is because she is an optimistic person. Patient is not sure that it has helped him. He feels that it is an inconvenience and it is causing some side effects described in the symptom score below. He is also not sure it is beneficial. He he and I discussed various studies showing the efficacy of nintedanib. Overall consensus is that best describes an allergy it acts as a hand brake in the number needed to treat and IPF at least is 1 to 6. However he does admit that he has progressive ILD as documented by the pulmonary function testing below. We did  discuss that benefit is to be weighed against the quality of life and goals of care. He is appreciative of that. He was supposed to have pulmonary function test for this visit based on instructions again to the office but for some unclear reason this has not happened.     SYMPTOM SCALE - ILD 11/06/2020   O2 use yes  Shortness of Breath 0 -> 5 scale with 5 being worst (score 6 If unable to do)  At rest 1.5 (4 per wife)  Simple tasks - showers, clothes change, eating, shaving 3.5  (5 per wife)  Household (dishes, doing bed, laundry) 6  Shopping 3  Walking level at own pace 4.5 (5 per wife)  Walking up Stairs 6  Total (30-36) Dyspnea Score 24.5  How bad is your cough? Varies usually frequent  How bad is your fatigue bad  How bad is nausea vries  How bad is vomiting?  na  How bad is diarrhea? na  How bad is anxiety? low  How bad is depression Medium and increasing     CT chest high resolution   IMPRESSION: 1. The appearance of the lungs is stable compared to prior studies, with a spectrum of findings that is once again categorized as probable usual interstitial pneumonia (UIP) per  current ATS guidelines. 2. There is also mild diffuse bronchial wall thickening with mild centrilobular and paraseptal emphysema; imaging findings suggestive of concurrent COPD. 3. Dilatation of the pulmonic trunk (3.9 cm in diameter), concerning for associated pulmonary arterial hypertension. 4. Aortic atherosclerosis, in addition to left main and 3 vessel coronary artery disease. Assessment for potential risk factor modification, dietary therapy or pharmacologic therapy may be warranted, if clinically indicated. 5. There are severe calcifications of the aortic valve and mitral valve/annulus. Echocardiographic correlation for evaluation of potential valvular dysfunction may be warranted if clinically indicated.   Aortic Atherosclerosis (ICD10-I70.0) and Emphysema (ICD10-J43.9).      Electronically Signed   By: Vinnie Langton M.D.   On: 07/31/2020 11:59      OV 12/18/2020  Subjective:  [name], male , DOB: 1940-10-18 , age 80 y.o. , MRN: [id number] , ADDRESS: [address] [place] [address] PCP Javier Glazier, MD Patient Care Team: Javier Glazier, MD as PCP - General (Internal Medicine) Gavin Pound, MD as Consulting Physician (Rheumatology)  This Provider for this visit: Treatment Team:  Attending Provider: Brand Males, MD    12/18/2020 -   Chief Complaint  Patient presents with   Follow-up    Pt states cough hasn't improved, mostly nonproductive but occasionally productive with grayish-brown phlegm. He is also still having SOB with activity.     [name] 80 y.o. -returns for follow-up.  He presents with his wife.  On this visit he is here to discuss about whether he wants to continue nintedanib or stop it.  He had pulmonary function testing.  We went over these results.  It appears that after he started nintedanib his DLCO is continued to decline but his FVC and total lung capacity seems to have stabilized.  There is a bit perplexed by this lack of congruency.  We explained the possibility being that perhaps the fibrosis worsening has slowed down versus his pulmonary hypertension has gotten worse.  Of note in summer 2021 he did have elevated pulmonary artery pressures.  Based on that he meets inclusion exclusion criteria [other than mobility] for inhaled treprostinil.  We discussed this in detail.  Went over the side effects.  He wants to read product brochure and reflect on it.  Showed a material on YouTube.  Also showed him the material on the Internet.  Also given chronic pressure.  Based on his reflections he will contact us.  Of note his Covid IgG was negative.  Recommend monoclonal antibody prophylaxis but the co-pay for this was $300 so he deferred this.    Include in patients with ILD   -  Right heart cath :  PVR > 3, PCWP </= 15, Pmap >/=  25 - Exclude - LVEF < 40%,  - Baseline o2 > 10L -      OV 06/09/2021  Subjective:  [name], male , DOB: [birth year] , age 54 y.o. , MRN: [id number] , ADDRESS: [address] [place] [address] PCP Javier Glazier, MD Patient Care Team: Javier Glazier, MD as PCP - General (Internal Medicine) Gavin Pound, MD as Consulting Physician (Rheumatology)  This Provider for this visit: Treatment Team:  Attending Provider: Brand Males, MD    06/09/2021 -   Chief Complaint  Patient presents with   Follow-up    Pt states that his breathing has become worse since last visit.     #  RA-ILD - Prob UIP. Prog phenotype  - He has history of rheumatoid arthritis for many years.  Has been on chronic methotrexate and folic acid since at least early 2000's.  He has been tried on Biologics and Plaquenil in the past.  with Dr. Trudie Reed, rheumatology.  -Remicade every 8 weeks current as of September 2021  -On nintedanib since August 2021 - stopped Sept 222 due to fatigue and diarhea  - Last CT Nov 2021  #Associatd Emphysema  #Severe OSA    - PSG 04/25/2000.  Severe sleep apnea, AHI 78/hr.  - CPAP download 06/29/2020-100% compliance with residual AHI of 0.9  #Enlarged pulmonary artery on the CT scan  -  Cardiac cath 05/27/2020-normal filling pressures, 50 to 60% mild LAD stenosis, mild pulmonary hypertension with PVR 3.2. PA 44/11, mean 29, PCWP mean 11  - refused tyvaso spring 2022   ##Chronic respiratory failure due to above  #Multiple medical problems including back pain  #Transfer of care from Dr. Vaughan Browner to Dr. Chase Caller at the ILD center 11/05/20   [name] 80 y.o. -returns for follow-up.  He presents with his wife.  He continues to take his nintedanib but he says he is now extremely fatigued also having some diarrhea.  He really feels that he is not benefiting from the nintedanib and  wants to stop it because of his symptoms.  He also reports that even though he is getting insulin for low cost it is an expense on the Society still feel like you should take it particularly when there is no benefit in the lot of side effects.  He feels the shortness of breath is getting worse for the last few months he is using 5 L of oxygen at rest and at night with the CPAP.  He has not had his pulmonary function test in several months.  At next visit his wife is interested in getting a repeat one for him.  He completed his ILD-Pro registry visit today.  His wife reports that they did a trip to the beach and also to family reunion in [place].  And he got extremely fatigued.  States that he sleeps 12 hours.  When he does his morning bathroom duties he just goes to the bed and sleeps.  He has no energy or motivation to do anything.  He feels there is an element of depression.  There is also unintentional weight loss   SYMPTOM SCALE - ILD 11/06/2020  12/18/2020  06/09/2021 223#  O2 use yes yes 5L with ex on walker and 5L with cPAP  Shortness of Breath 0 -> 5 scale with 5 being worst (score 6 If unable to do)    At rest 1.5 (4 per wife) 1.5 1  Simple tasks - showers, clothes change, eating, shaving 3.5  (5 per wife) 3.5 4  Household (dishes, doing bed, laundry) _0 Shopping 3 3.5 6  Walking level at own pace 4.5 (5 per wife) 3.5 4  Walking up Stairs _1 Total (30-36) Dyspnea Score 24._2 How bad is your cough? Varies usually frequent 3 4  How bad is your fatigue bad 3.5 5  How bad is nausea vries 1.5 1  How bad is vomiting?  na na 0  How bad is diarrhea? na na 2  How bad is anxiety? low na 1  How bad is depression Medium and increasing 3.5 4    PFT  PFT Results Latest  Ref Rng & Units 11/13/2020 04/09/2020 11/09/2019 05/14/2019 07/04/2018  FVC-Pre L 2.24 1.73 2.16 2.40 2.64  FVC-Predicted Pre % 61 47 58 64 71  FVC-Post L 2.30 - 2.34 2.32 2.59  FVC-Predicted Post % 63 - 64 62 70   Pre FEV1/FVC % % 62 59 61 66 71  Post FEV1/FCV % % 67 - 65 66 72  FEV1-Pre L 1.40 1.02 1.31 1.57 1.87  FEV1-Predicted Pre % 54 39 49 59 70  FEV1-Post L 1.55 - 1.52 1.54 1.86  DLCO uncorrected ml/min/mmHg 12.08 13.71 14.23 13.34 14.88  DLCO UNC% % 53 60 62 58 52  DLCO corrected ml/min/mmHg 12.08 13.71 14.53 13.83 15.33  DLCO COR %Predicted % 53 60 64 60 54  DLVA Predicted % 83 98 92 84 78  TLC L 4.44 - 4.15 4.47 5.26  TLC % Predicted % 68 - 64 69 81  RV % Predicted % 89 - 76 89 109       has a past medical history of Chronic lower back pain, Complication of anesthesia (01/2017), COPD (chronic obstructive pulmonary disease) (HCC), Degenerative scoliosis in adult patient, Depression, Dyspnea, GERD (gastroesophageal reflux disease), H/O seasonal allergies, High cholesterol, History of blood transfusion, History of kidney stones, Hypertension, 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   reports that he quit smoking about 6 years ago. His smoking use included cigarettes. He started smoking about 60 years ago. He has a 81.00 pack-year smoking history. He has never used smokeless tobacco.  Past Surgical History:  Procedure Laterality Date   BACK SURGERY     COLONOSCOPY W/ POLYPECTOMY     FRACTURE SURGERY     HERNIA REPAIR     HIP ARTHROPLASTY Left 04/2016   INGUINAL HERNIA REPAIR Left 1986   JOINT REPLACEMENT     LUMBAR WOUND DEBRIDEMENT N/A 04/18/2017   Procedure: REPAIR OF LUMBAR PSEUDOMENINGOCELE;  Surgeon: Newman Pies, MD;  Location: [facility];  Service: Neurosurgery;  Laterality: N/A;  REAIR OF LUMBAR PSEUDOMENINGOCELE   LUMBAR WOUND DEBRIDEMENT N/A 05/05/2017   Procedure: I&D Lumbar Wound;  Surgeon: Newman Pies, MD;  Location: [facility];  Service: Neurosurgery;  Laterality: N/A;   MAXIMUM ACCESS (MAS)POSTERIOR LUMBAR  INTERBODY FUSION (PLIF) 3 LEVEL  02/17/2017   [name] 02/17/2017   OPEN SURGICAL REPAIR OF GLUTEAL TENDON Right 11/02/2017   Procedure: Right hip gluteal tendon repair;  Surgeon: Gaynelle Arabian, MD;  Location: [facility];  Service: Orthopedics;  Laterality: Right;   POSTERIOR LAMINECTOMY / DECOMPRESSION LUMBAR SPINE  2010   PROSTATE BIOPSY     PROSTATE BIOPSY  <2013 X 3   RIGHT HEART CATH AND CORONARY ANGIOGRAPHY N/A 05/27/2020   Procedure: RIGHT HEART CATH AND CORONARY ANGIOGRAPHY;  Surgeon: Larey Dresser, MD;  Location: [facility];  Service: Cardiovascular;  Laterality: N/A;   SPLENECTOMY  1955   TONSILLECTOMY     TOTAL HIP ARTHROPLASTY Right 51/0258   UMBILICAL HERNIA REPAIR  05/2016    Allergies  Allergen Reactions   Sulfa Antibiotics Shortness Of Breath   Sulfamethoxazole-Trimethoprim Shortness Of Breath   Sulfasalazine Shortness Of Breath   Other Other (See Comments)   Varenicline Other (See Comments)     Strange thoughts SUICIDAL   Lisinopril Cough    Immunization History  Administered Date(s) Administered   19-influenza Whole 07/18/2012, 06/14/2014, 08/12/2015, 06/03/2016   HiB (PRP-T) 08/23/2017   Influenza,  High Dose Seasonal PF 06/12/2018, 07/06/2019, 06/30/2020   Influenza,inj,Quad PF,6+ Mos 06/28/2017   Influenza-Unspecified 07/18/2012, 06/14/2014, 08/12/2015, 06/03/2016, 06/28/2017   Meningococcal B, OMV 08/23/2017, 09/21/2017   Meningococcal Conjugate 08/23/2017   Meningococcal Mcv4o 08/23/2017, 10/18/2017   Moderna SARS-COV2 Booster Vaccination 01/27/2021   Moderna Sars-Covid-2 Vaccination 10/11/2019, 11/06/2019, 07/30/2020   Pneumococcal Conjugate-13 11/28/2013, 11/28/2013   Pneumococcal Polysaccharide-23 12/26/2005, 09/13/2012   Pneumococcal-Unspecified 12/26/2005, 09/13/2012   Tdap 11/28/2013, 11/28/2013   Zoster, Live 06/06/2012, 06/06/2012    Family History  Problem Relation Age of Onset   Cancer Mother    Heart disease Father    Stroke Father       Current Outpatient Medications:    albuterol (PROVENTIL HFA;VENTOLIN HFA) 108 (90 Base) MCG/ACT inhaler, Inhale 2 puffs into the lungs every 6 (six) hours as needed for wheezing or shortness of breath., Disp: , Rfl:    aspirin EC 81 MG tablet, Take 81 mg by mouth daily. Swallow whole., Disp: , Rfl:    BREO ELLIPTA 100-25 MCG/INH AEPB, INHALE 1 PUFF INTO LUNGS DAILY, Disp: 180 each, Rfl: 0   DULoxetine (CYMBALTA) 60 MG capsule, Take 60 mg by mouth every evening. , Disp: , Rfl:    finasteride (PROSCAR) 5 MG tablet, Take 5 mg by mouth at bedtime. , Disp: , Rfl:    fluticasone (FLONASE) 50 MCG/ACT nasal spray, Place 2 sprays into both nostrils daily as needed for allergies. , Disp: , Rfl:    furosemide (LASIX) 20 MG tablet, Take 1 tablet (69m total) daily for next 7 days for fluid management (Patient taking differently: Take 20 mg by mouth daily.), Disp: 7 tablet, Rfl: 0   gabapentin (NEURONTIN) 300 MG capsule, Take 300-800 mg by mouth See admin instructions. Take 300 mg in the morning 600 mg in the afternoon and 600-800 at bedtime, Disp: , Rfl:    ibuprofen (ADVIL) 200 MG tablet, Take 600-800 mg by mouth daily as needed for mild pain or moderate pain., Disp: , Rfl:    losartan (COZAAR) 25 MG tablet, TAKE ONE (1) TABLET BY MOUTH EACH DAY, Disp: 90 tablet, Rfl: 0   metFORMIN (GLUCOPHAGE) 500 MG tablet, Take 500 mg by mouth daily. , Disp: , Rfl:    methotrexate (RHEUMATREX) 2.5 MG tablet, Take 2.5 mg by mouth once a week. Caution:Chemotherapy. Protect from light., Disp: , Rfl:    metoprolol succinate (TOPROL-XL) 50 MG 24 hr tablet, TAKE ONE TABLET BY MOUTH TWICE DAILY WITH OR IMMEDIATELY FOLLOWING A MEAL, Disp: 180 tablet, Rfl: 3   OFEV 150 MG CAPS, TAKE 1 CAPSULE BY MOUTH TWICE A DAY, Disp: 60 capsule, Rfl: 2   predniSONE (DELTASONE) 5 MG tablet, Take 5 mg by mouth daily. , Disp: , Rfl:    sertraline (ZOLOFT) 100 MG tablet, Take 100 mg by mouth daily., Disp: , Rfl:    simvastatin (ZOCOR) 40 MG  tablet, Take 40 mg by mouth at bedtime. , Disp: , Rfl:    SPIRIVA RESPIMAT 2.5 MCG/ACT AERS, INHALE TWO (2) PUFFS INTO THE LUNGS ONCE DAILY, Disp: 12 g, Rfl: 3   tamsulosin (FLOMAX) 0.4 MG CAPS capsule, Take 0.4 mg by mouth at bedtime. , Disp: , Rfl:    traMADol (ULTRAM) 50 MG tablet, Take 50-100 mg by mouth every 6 (six) hours as needed for moderate pain or severe pain. , Disp: , Rfl:    traZODone (DESYREL) 50 MG tablet, Take 50 mg by mouth at bedtime., Disp: , Rfl:       Objective:  Vitals:   06/09/21 1520  BP: 116/60  Pulse: 80  Temp: 98 F (36.7 C)  TempSrc: Oral  SpO2: 97%  Weight: 223 lb 6.4 oz (101.3 kg)  Height: _0  (1.702 m)    Estimated body mass index is 34.99 kg/m as calculated from the following:   Height as of this encounter: _1  (1.702 m).   Weight as of this encounter: 223 lb 6.4 oz (101.3 kg).  _2 @  Filed Weights   06/09/21 1520  Weight: 223 lb 6.4 oz (101.3 kg)     Physical Exam  General: No distress. obese Neuro: Alert and Oriented x 3. GCS 15. Speech normal Psych: Pleasant Resp:  Barrel Chest - no.  Wheeze - no, Crackles - yes , somewhat labored talking.  Is on room air.  He has a walker with him on his side., No overt respiratory distress CVS: Normal heart sounds. Murmurs - no Ext: Stigmata of Connective Tissue Disease -features of rheumatoid arthritis present. HEENT: Normal upper airway. PEERL +. No post nasal drip        Assessment:       ICD-10-CM   1. Interstitial lung disease due to connective tissue disease ([place])  J84.89    M35.9     2. Therapeutic drug monitoring  Z51.81     3. Rheumatoid arthritis involving multiple sites with positive rheumatoid factor (HCC)  M05.79     4. OSA on CPAP  G47.33    Z99.89     5. Pulmonary hypertension (HCC)  I27.20     6. Chronic respiratory failure with hypoxia (HCC)  J96.11          Plan:     Patient Instructions     ICD-10-CM   1. Interstitial lung disease due to  connective tissue disease ([place])  J84.89    M35.9     2. Therapeutic drug monitoring  Z51.81     3. Rheumatoid arthritis involving multiple sites with positive rheumatoid factor (HCC)  M05.79     4. OSA on CPAP  G47.33    Z99.89     5. Pulmonary hypertension (HCC)  I27.20     6. Chronic respiratory failure with hypoxia (HCC)  J96.11       Symptoms appear progressive since earlier in 2022 and this could be because of worsening fibrosis Worsening fatigue and diarrhea could be because of nintedanib We discussed that you are having significant fatigue and decline in quality of life Respect your desire to stop nintedanib and I support this Appreciate you doing the ILD pro registry visit today   Plan - Stop nintedanib  -If you have any donor samples please bring it and give it to us/me -Continue oxygen 5 L nasal cannula at night with CPAP and also with exertion -Respect desire to not take inhaled treprostinil -Do repeat spirometry and DLCO in 6-8 weeks  Follow-up -Return in 6-8 weeks to see Dr. Chase Caller in a 30-minute visit but after spirometry and DLCO  -ILD symptom score and simple walking desaturation test on room air at follow-up    SIGNATURE    Dr. Brand Males, M.D., F.C.C.P,  Pulmonary and Critical Care Medicine Staff Physician, [place] Director - Interstitial Lung Disease  Program  Pulmonary [place] at [place], [place], [address]  Pager: [phone], If no answer or between  15:00h - 7:00h: call [phone] Telephone: [phone]  4:00 PM 06/09/2021

## 2021-06-09 NOTE — Patient Instructions (Addendum)
ICD-10-CM   1. Interstitial lung disease due to connective tissue disease ([place])  J84.89    M35.9     2. Therapeutic drug monitoring  Z51.81     3. Rheumatoid arthritis involving multiple sites with positive rheumatoid factor (HCC)  M05.79     4. OSA on CPAP  G47.33    Z99.89     5. Pulmonary hypertension (HCC)  I27.20     6. Chronic respiratory failure with hypoxia (HCC)  J96.11       Symptoms appear progressive since earlier in 2022 and this could be because of worsening fibrosis Worsening fatigue and diarrhea could be because of nintedanib We discussed that you are having significant fatigue and decline in quality of life Respect your desire to stop nintedanib and I support this Appreciate you doing the ILD pro registry visit today   Plan - Stop nintedanib  -If you have any donor samples please bring it and give it to us/me -Continue oxygen 5 L nasal cannula at night with CPAP and also with exertion -Respect desire to not take inhaled treprostinil -Do repeat spirometry and DLCO in 6-8 weeks  Follow-up -Return in 6-8 weeks to see Dr. Chase Caller in a 30-minute visit but after spirometry and DLCO  -ILD symptom score and simple walking desaturation test on room air at follow-up

## 2021-06-11 ENCOUNTER — Ambulatory Visit (HOSPITAL_BASED_OUTPATIENT_CLINIC_OR_DEPARTMENT_OTHER): Payer: Medicare PPO | Admitting: Internal Medicine

## 2021-06-11 ENCOUNTER — Other Ambulatory Visit: Payer: Self-pay

## 2021-06-11 VITALS — BP 116/68 | HR 81 | Ht 67.0 in | Wt 224.2 lb

## 2021-06-11 DIAGNOSIS — G4733 Obstructive sleep apnea (adult) (pediatric): Secondary | ICD-10-CM | POA: Diagnosis not present

## 2021-06-11 DIAGNOSIS — I471 Supraventricular tachycardia: Secondary | ICD-10-CM | POA: Diagnosis not present

## 2021-06-11 DIAGNOSIS — I10 Essential (primary) hypertension: Secondary | ICD-10-CM

## 2021-06-11 DIAGNOSIS — I4719 Other supraventricular tachycardia: Secondary | ICD-10-CM

## 2021-06-11 DIAGNOSIS — Z9989 Dependence on other enabling machines and devices: Secondary | ICD-10-CM | POA: Diagnosis not present

## 2021-06-11 NOTE — Progress Notes (Signed)
PCP: Javier Glazier, MD Primary Cardiologist: Dr Stanford Breed Primary EP: Dr Evern Bio is a 80 y.o. male who presents today for routine electrophysiology followup.  Since last being seen in our clinic, the patient reports doing very well.  Today, he denies symptoms of palpitations, chest pain, shortness of breath,  lower extremity edema, dizziness, presyncope, or syncope.  The patient is otherwise without complaint today.   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/2017   OSA on CPAP    wears CPAP   Pneumonia    "I've had it several times; last time was end of Jan 2018" (05/02/2017)   Pre-diabetes    Prostate cancer ([place])    Rheumatoid arteritis ([place])    "all over" (05/02/2017)   Thrush 12/07/2017   Wears glasses    Past Surgical History:  Procedure Laterality Date   BACK SURGERY     COLONOSCOPY W/ POLYPECTOMY     FRACTURE SURGERY     HERNIA REPAIR     HIP ARTHROPLASTY Left 04/2016   INGUINAL HERNIA REPAIR Left 1986   JOINT REPLACEMENT     LUMBAR WOUND DEBRIDEMENT N/A 04/18/2017   Procedure: REPAIR OF LUMBAR PSEUDOMENINGOCELE;  Surgeon: Newman Pies, MD;  Location: [facility];  Service: Neurosurgery;  Laterality: N/A;  REAIR OF LUMBAR PSEUDOMENINGOCELE   LUMBAR WOUND DEBRIDEMENT N/A 05/05/2017   Procedure: I&D Lumbar Wound;  Surgeon: Newman Pies, MD;  Location: [facility];  Service: Neurosurgery;  Laterality: N/A;   MAXIMUM ACCESS (MAS)POSTERIOR LUMBAR INTERBODY  FUSION (PLIF) 3 LEVEL  02/17/2017   [name] 02/17/2017   OPEN SURGICAL REPAIR OF GLUTEAL TENDON Right 11/02/2017   Procedure: Right hip gluteal tendon repair;  Surgeon: Gaynelle Arabian, MD;  Location: [facility];  Service: Orthopedics;  Laterality: Right;   POSTERIOR LAMINECTOMY / DECOMPRESSION LUMBAR SPINE  2010   PROSTATE BIOPSY     PROSTATE BIOPSY  <2013 X 3   RIGHT HEART CATH AND CORONARY ANGIOGRAPHY N/A 05/27/2020   Procedure: RIGHT HEART CATH AND CORONARY ANGIOGRAPHY;  Surgeon: Larey Dresser, MD;  Location: [facility];  Service: Cardiovascular;  Laterality: N/A;   SPLENECTOMY  1955   TONSILLECTOMY     TOTAL HIP ARTHROPLASTY Right 44/3154   UMBILICAL HERNIA REPAIR  05/2016    ROS- all systems are reviewed and negatives except as per HPI above  Current Outpatient Medications  Medication Sig Dispense Refill   albuterol (PROVENTIL HFA;VENTOLIN HFA) 108 (90 Base) MCG/ACT inhaler Inhale 2 puffs into the lungs every 6 (six) hours as needed for wheezing or shortness of breath.     aspirin EC 81 MG tablet Take 81 mg by mouth daily. Swallow whole.     BREO ELLIPTA 100-25 MCG/INH AEPB INHALE 1 PUFF INTO LUNGS DAILY 180 each 0   DULoxetine (CYMBALTA) 60 MG capsule Take 60 mg by mouth every evening.  finasteride (PROSCAR) 5 MG tablet Take 5 mg by mouth at bedtime.      fluticasone (FLONASE) 50 MCG/ACT nasal spray Place 2 sprays into both nostrils daily as needed for allergies.      furosemide (LASIX) 20 MG tablet Take 1 tablet (23m total) daily for next 7 days for fluid management (Patient taking differently: Take 20 mg by mouth daily.) 7 tablet 0   gabapentin (NEURONTIN) 300 MG capsule Take 300-800 mg by mouth See admin instructions. Take 300 mg in the morning 600 mg in the afternoon and 600-800 at bedtime     ibuprofen (ADVIL) 200 MG tablet Take 600-800 mg by mouth daily as needed for mild pain or moderate pain.     losartan (COZAAR) 25 MG tablet TAKE ONE (1) TABLET BY MOUTH EACH DAY 90  tablet 0   metFORMIN (GLUCOPHAGE) 500 MG tablet Take 500 mg by mouth daily.      methotrexate (RHEUMATREX) 2.5 MG tablet Take 2.5 mg by mouth once a week. Caution:Chemotherapy. Protect from light.     metoprolol succinate (TOPROL-XL) 50 MG 24 hr tablet TAKE ONE TABLET BY MOUTH TWICE DAILY WITH OR IMMEDIATELY FOLLOWING A MEAL 180 tablet 3   OFEV 150 MG CAPS TAKE 1 CAPSULE BY MOUTH TWICE A DAY 60 capsule 2   predniSONE (DELTASONE) 5 MG tablet Take 5 mg by mouth daily.      sertraline (ZOLOFT) 100 MG tablet Take 100 mg by mouth daily.     simvastatin (ZOCOR) 40 MG tablet Take 40 mg by mouth at bedtime.      SPIRIVA RESPIMAT 2.5 MCG/ACT AERS INHALE TWO (2) PUFFS INTO THE LUNGS ONCE DAILY 12 g 3   tamsulosin (FLOMAX) 0.4 MG CAPS capsule Take 0.4 mg by mouth at bedtime.      traMADol (ULTRAM) 50 MG tablet Take 50-100 mg by mouth every 6 (six) hours as needed for moderate pain or severe pain.      traZODone (DESYREL) 50 MG tablet Take 50 mg by mouth at bedtime.     No current facility-administered medications for this visit.    Physical Exam: Vitals:   06/11/21 1356  BP: 116/68  Pulse: 81  SpO2: 92%  Weight: 224 lb 3.2 oz (101.7 kg)  Height: _0  (1.702 m)    GEN- The patient is well appearing, alert and oriented x 3 today.   Head- normocephalic, atraumatic Eyes-  Sclera clear, conjunctiva pink Ears- hearing intact Oropharynx- clear Lungs- + crackles at bases, normal work of breathing Heart- Regular rate and rhythm, no murmurs, rubs or gallops, PMI not laterally displaced GI- soft, NT, ND, + BS Extremities- no clubbing, cyanosis, +2 edema  Wt Readings from Last 3 Encounters:  06/11/21 224 lb 3.2 oz (101.7 kg)  06/09/21 223 lb 6.4 oz (101.3 kg)  12/18/20 237 lb 12.8 oz (107.9 kg)    EKG tracing ordered today is personally reviewed and shows sinus rhythm  Assessment and Plan:  Multifocal ATach Secondary to lung disease.  Primary treatment is to focus on lung dz Well  controlled Would have low threshold to wean metoprolol  2. HTN Continue losartan 297mdaily  3. OSA Compliant with CPAP  4. Interstitial pulmonary fibrosis Follows with Dr RaChase Callertable No change required today [facility] [place]/31/21 shows mild pulmonary HTN 50-60% mid LAD stenosis  5. HL Continue zocor 4036maily  6. Chronic diastolic dysfunction Likely secondary to lung dz Stable No change required today    Risks, benefits and potential  toxicities for medications prescribed and/or refilled reviewed with patient today.   Return to see cardiology APP in a year EP to see as needed  Thompson Grayer MD, [facility] 06/11/2021 2:01 PM

## 2021-06-11 NOTE — Patient Instructions (Addendum)
Medication Instructions:  Your physician recommends that you continue on your current medications as directed. Please refer to the Current Medication list given to you today.  Labwork: None ordered.  Testing/Procedures: None ordered.  Follow-Up: Your physician wants you to follow-up in: 12 months with  Michael Grayer, MD or one of the following Advanced Practice Providers on your designated Care Team:    [name]   You will receive a reminder letter in the mail two months in advance. If you don't receive a letter, please call our office to schedule the follow-up appointment.   Any Other Special Instructions Will Be Listed Below (If Applicable).  If you need a refill on your cardiac medications before your next appointment, please call your pharmacy.

## 2021-07-05 ENCOUNTER — Other Ambulatory Visit: Payer: Self-pay

## 2021-07-05 ENCOUNTER — Encounter (HOSPITAL_BASED_OUTPATIENT_CLINIC_OR_DEPARTMENT_OTHER): Payer: Self-pay | Admitting: Emergency Medicine

## 2021-07-05 ENCOUNTER — Emergency Department (HOSPITAL_BASED_OUTPATIENT_CLINIC_OR_DEPARTMENT_OTHER): Payer: Medicare PPO

## 2021-07-05 ENCOUNTER — Inpatient Hospital Stay (HOSPITAL_BASED_OUTPATIENT_CLINIC_OR_DEPARTMENT_OTHER)
Admission: EM | Admit: 2021-07-05 | Discharge: 2021-07-28 | DRG: 193 | Disposition: E | Payer: Medicare PPO | Attending: Internal Medicine | Admitting: Internal Medicine

## 2021-07-05 DIAGNOSIS — E669 Obesity, unspecified: Secondary | ICD-10-CM | POA: Diagnosis present

## 2021-07-05 DIAGNOSIS — J9811 Atelectasis: Secondary | ICD-10-CM | POA: Diagnosis present

## 2021-07-05 DIAGNOSIS — I444 Left anterior fascicular block: Secondary | ICD-10-CM | POA: Diagnosis present

## 2021-07-05 DIAGNOSIS — R059 Cough, unspecified: Secondary | ICD-10-CM

## 2021-07-05 DIAGNOSIS — Z7982 Long term (current) use of aspirin: Secondary | ICD-10-CM

## 2021-07-05 DIAGNOSIS — R7401 Elevation of levels of liver transaminase levels: Secondary | ICD-10-CM | POA: Diagnosis not present

## 2021-07-05 DIAGNOSIS — Z96643 Presence of artificial hip joint, bilateral: Secondary | ICD-10-CM | POA: Diagnosis present

## 2021-07-05 DIAGNOSIS — Z7984 Long term (current) use of oral hypoglycemic drugs: Secondary | ICD-10-CM

## 2021-07-05 DIAGNOSIS — J440 Chronic obstructive pulmonary disease with acute lower respiratory infection: Secondary | ICD-10-CM | POA: Diagnosis present

## 2021-07-05 DIAGNOSIS — M0579 Rheumatoid arthritis with rheumatoid factor of multiple sites without organ or systems involvement: Secondary | ICD-10-CM | POA: Diagnosis present

## 2021-07-05 DIAGNOSIS — G4733 Obstructive sleep apnea (adult) (pediatric): Secondary | ICD-10-CM | POA: Diagnosis present

## 2021-07-05 DIAGNOSIS — I471 Supraventricular tachycardia: Secondary | ICD-10-CM | POA: Diagnosis present

## 2021-07-05 DIAGNOSIS — E876 Hypokalemia: Secondary | ICD-10-CM | POA: Diagnosis not present

## 2021-07-05 DIAGNOSIS — Z87891 Personal history of nicotine dependence: Secondary | ICD-10-CM

## 2021-07-05 DIAGNOSIS — D61818 Other pancytopenia: Secondary | ICD-10-CM | POA: Diagnosis present

## 2021-07-05 DIAGNOSIS — E871 Hypo-osmolality and hyponatremia: Secondary | ICD-10-CM | POA: Diagnosis present

## 2021-07-05 DIAGNOSIS — Z6834 Body mass index (BMI) 34.0-34.9, adult: Secondary | ICD-10-CM | POA: Diagnosis not present

## 2021-07-05 DIAGNOSIS — Z20822 Contact with and (suspected) exposure to covid-19: Secondary | ICD-10-CM | POA: Diagnosis present

## 2021-07-05 DIAGNOSIS — B370 Candidal stomatitis: Secondary | ICD-10-CM | POA: Diagnosis present

## 2021-07-05 DIAGNOSIS — F32A Depression, unspecified: Secondary | ICD-10-CM | POA: Diagnosis present

## 2021-07-05 DIAGNOSIS — J449 Chronic obstructive pulmonary disease, unspecified: Secondary | ICD-10-CM | POA: Diagnosis not present

## 2021-07-05 DIAGNOSIS — J849 Interstitial pulmonary disease, unspecified: Secondary | ICD-10-CM | POA: Diagnosis not present

## 2021-07-05 DIAGNOSIS — Z9981 Dependence on supplemental oxygen: Secondary | ICD-10-CM

## 2021-07-05 DIAGNOSIS — J9622 Acute and chronic respiratory failure with hypercapnia: Secondary | ICD-10-CM | POA: Diagnosis present

## 2021-07-05 DIAGNOSIS — G894 Chronic pain syndrome: Secondary | ICD-10-CM | POA: Diagnosis present

## 2021-07-05 DIAGNOSIS — D539 Nutritional anemia, unspecified: Secondary | ICD-10-CM | POA: Diagnosis present

## 2021-07-05 DIAGNOSIS — R634 Abnormal weight loss: Secondary | ICD-10-CM

## 2021-07-05 DIAGNOSIS — D709 Neutropenia, unspecified: Secondary | ICD-10-CM | POA: Diagnosis not present

## 2021-07-05 DIAGNOSIS — J9621 Acute and chronic respiratory failure with hypoxia: Secondary | ICD-10-CM | POA: Diagnosis present

## 2021-07-05 DIAGNOSIS — R338 Other retention of urine: Secondary | ICD-10-CM | POA: Diagnosis present

## 2021-07-05 DIAGNOSIS — R5081 Fever presenting with conditions classified elsewhere: Secondary | ICD-10-CM | POA: Diagnosis not present

## 2021-07-05 DIAGNOSIS — I10 Essential (primary) hypertension: Secondary | ICD-10-CM | POA: Diagnosis present

## 2021-07-05 DIAGNOSIS — K219 Gastro-esophageal reflux disease without esophagitis: Secondary | ICD-10-CM | POA: Diagnosis present

## 2021-07-05 DIAGNOSIS — J8410 Pulmonary fibrosis, unspecified: Secondary | ICD-10-CM | POA: Diagnosis present

## 2021-07-05 DIAGNOSIS — Z515 Encounter for palliative care: Secondary | ICD-10-CM

## 2021-07-05 DIAGNOSIS — J9601 Acute respiratory failure with hypoxia: Secondary | ICD-10-CM | POA: Diagnosis not present

## 2021-07-05 DIAGNOSIS — Z7189 Other specified counseling: Secondary | ICD-10-CM | POA: Diagnosis not present

## 2021-07-05 DIAGNOSIS — Z9081 Acquired absence of spleen: Secondary | ICD-10-CM

## 2021-07-05 DIAGNOSIS — R0600 Dyspnea, unspecified: Secondary | ICD-10-CM

## 2021-07-05 DIAGNOSIS — K1230 Oral mucositis (ulcerative), unspecified: Secondary | ICD-10-CM | POA: Diagnosis present

## 2021-07-05 DIAGNOSIS — J189 Pneumonia, unspecified organism: Principal | ICD-10-CM | POA: Diagnosis present

## 2021-07-05 DIAGNOSIS — Z882 Allergy status to sulfonamides status: Secondary | ICD-10-CM

## 2021-07-05 DIAGNOSIS — Z66 Do not resuscitate: Secondary | ICD-10-CM | POA: Diagnosis present

## 2021-07-05 DIAGNOSIS — R0902 Hypoxemia: Secondary | ICD-10-CM | POA: Diagnosis not present

## 2021-07-05 DIAGNOSIS — J42 Unspecified chronic bronchitis: Secondary | ICD-10-CM

## 2021-07-05 DIAGNOSIS — L89316 Pressure-induced deep tissue damage of right buttock: Secondary | ICD-10-CM | POA: Diagnosis present

## 2021-07-05 DIAGNOSIS — J9602 Acute respiratory failure with hypercapnia: Secondary | ICD-10-CM | POA: Diagnosis not present

## 2021-07-05 DIAGNOSIS — Z79899 Other long term (current) drug therapy: Secondary | ICD-10-CM

## 2021-07-05 DIAGNOSIS — L8990 Pressure ulcer of unspecified site, unspecified stage: Secondary | ICD-10-CM | POA: Insufficient documentation

## 2021-07-05 DIAGNOSIS — G8929 Other chronic pain: Secondary | ICD-10-CM | POA: Diagnosis present

## 2021-07-05 DIAGNOSIS — E162 Hypoglycemia, unspecified: Secondary | ICD-10-CM | POA: Diagnosis not present

## 2021-07-05 DIAGNOSIS — E7800 Pure hypercholesterolemia, unspecified: Secondary | ICD-10-CM | POA: Diagnosis present

## 2021-07-05 DIAGNOSIS — Z9989 Dependence on other enabling machines and devices: Secondary | ICD-10-CM

## 2021-07-05 DIAGNOSIS — N401 Enlarged prostate with lower urinary tract symptoms: Secondary | ICD-10-CM | POA: Diagnosis present

## 2021-07-05 DIAGNOSIS — Z8249 Family history of ischemic heart disease and other diseases of the circulatory system: Secondary | ICD-10-CM

## 2021-07-05 DIAGNOSIS — M069 Rheumatoid arthritis, unspecified: Secondary | ICD-10-CM | POA: Diagnosis not present

## 2021-07-05 DIAGNOSIS — Z8546 Personal history of malignant neoplasm of prostate: Secondary | ICD-10-CM

## 2021-07-05 DIAGNOSIS — Z809 Family history of malignant neoplasm, unspecified: Secondary | ICD-10-CM

## 2021-07-05 DIAGNOSIS — J9611 Chronic respiratory failure with hypoxia: Secondary | ICD-10-CM | POA: Diagnosis not present

## 2021-07-05 DIAGNOSIS — I4891 Unspecified atrial fibrillation: Secondary | ICD-10-CM | POA: Diagnosis present

## 2021-07-05 DIAGNOSIS — Z7951 Long term (current) use of inhaled steroids: Secondary | ICD-10-CM

## 2021-07-05 DIAGNOSIS — M545 Low back pain, unspecified: Secondary | ICD-10-CM | POA: Diagnosis not present

## 2021-07-05 DIAGNOSIS — D72819 Decreased white blood cell count, unspecified: Secondary | ICD-10-CM

## 2021-07-05 DIAGNOSIS — I2510 Atherosclerotic heart disease of native coronary artery without angina pectoris: Secondary | ICD-10-CM | POA: Diagnosis present

## 2021-07-05 DIAGNOSIS — Z888 Allergy status to other drugs, medicaments and biological substances status: Secondary | ICD-10-CM

## 2021-07-05 LAB — PROTIME-INR
INR: 1 (ref 0.8–1.2)
Prothrombin Time: 13.3 seconds (ref 11.4–15.2)

## 2021-07-05 LAB — RESPIRATORY PANEL BY PCR

## 2021-07-05 LAB — URINALYSIS, ROUTINE W REFLEX MICROSCOPIC
Bilirubin Urine: NEGATIVE
Glucose, UA: NEGATIVE mg/dL
Hgb urine dipstick: NEGATIVE
Ketones, ur: NEGATIVE mg/dL
Leukocytes,Ua: NEGATIVE
Nitrite: NEGATIVE
Protein, ur: NEGATIVE mg/dL
Specific Gravity, Urine: 1.005 (ref 1.005–1.030)
pH: 6 (ref 5.0–8.0)

## 2021-07-05 LAB — COMPREHENSIVE METABOLIC PANEL
ALT: 77 U/L — ABNORMAL HIGH (ref 0–44)
AST: 44 U/L — ABNORMAL HIGH (ref 15–41)
Albumin: 3.3 g/dL — ABNORMAL LOW (ref 3.5–5.0)
Alkaline Phosphatase: 44 U/L (ref 38–126)
Anion gap: 9 (ref 5–15)
BUN: 20 mg/dL (ref 8–23)
CO2: 29 mmol/L (ref 22–32)
Calcium: 8.4 mg/dL — ABNORMAL LOW (ref 8.9–10.3)
Chloride: 96 mmol/L — ABNORMAL LOW (ref 98–111)
Creatinine, Ser: 0.87 mg/dL (ref 0.61–1.24)
GFR, Estimated: >60 mL/min (ref >60)
Glucose, Bld: 95 mg/dL (ref 70–99)
Potassium: 3 mmol/L — ABNORMAL LOW (ref 3.5–5.1)
Sodium: 134 mmol/L — ABNORMAL LOW (ref 135–145)
Total Bilirubin: 1 mg/dL (ref 0.3–1.2)
Total Protein: 6.3 g/dL — ABNORMAL LOW (ref 6.5–8.1)

## 2021-07-05 LAB — CBC WITH DIFFERENTIAL/PLATELET
Abs Immature Granulocytes: 0.06 10*3/uL (ref 0.00–0.07)
Basophils Absolute: 0 10*3/uL (ref 0.0–0.1)
Basophils Relative: 1 %
Eosinophils Absolute: 0.1 10*3/uL (ref 0.0–0.5)
Eosinophils Relative: 9 %
HCT: 30.1 % — ABNORMAL LOW (ref 39.0–52.0)
Hemoglobin: 10.3 g/dL — ABNORMAL LOW (ref 13.0–17.0)
Immature Granulocytes: 4 %
Lymphocytes Relative: 43 %
Lymphs Abs: 0.6 10*3/uL — ABNORMAL LOW (ref 0.7–4.0)
MCH: 39 pg — ABNORMAL HIGH (ref 26.0–34.0)
MCHC: 34.2 g/dL (ref 30.0–36.0)
MCV: 114 fL — ABNORMAL HIGH (ref 80.0–100.0)
Monocytes Absolute: 0.1 10*3/uL (ref 0.1–1.0)
Monocytes Relative: 4 %
Neutro Abs: 0.6 10*3/uL — ABNORMAL LOW (ref 1.7–7.7)
Neutrophils Relative %: 39 %
Platelets: 33 10*3/uL — ABNORMAL LOW (ref 150–400)
RBC: 2.64 MIL/uL — ABNORMAL LOW (ref 4.22–5.81)
RDW: 13.9 % (ref 11.5–15.5)
Smear Review: NORMAL
WBC: 1.5 10*3/uL — ABNORMAL LOW (ref 4.0–10.5)
nRBC: 3.8 % — ABNORMAL HIGH (ref 0.0–0.2)

## 2021-07-05 LAB — C-REACTIVE PROTEIN: CRP: 9.1 mg/dL — ABNORMAL HIGH (ref <1.0)

## 2021-07-05 LAB — LACTIC ACID, PLASMA: Lactic Acid, Venous: 1.3 mmol/L (ref 0.5–1.9)

## 2021-07-05 LAB — PROCALCITONIN: Procalcitonin: <0.1 ng/mL

## 2021-07-05 LAB — RESP PANEL BY RT-PCR (FLU A&B, COVID) ARPGX2
Influenza A by PCR: NEGATIVE
Influenza B by PCR: NEGATIVE
SARS Coronavirus 2 by RT PCR: NEGATIVE

## 2021-07-05 LAB — TYPE AND SCREEN
ABO/RH(D): A POS
Antibody Screen: NEGATIVE

## 2021-07-05 LAB — APTT: aPTT: 23 seconds — ABNORMAL LOW (ref 24–36)

## 2021-07-05 LAB — SEDIMENTATION RATE: Sed Rate: 21 mm/hr — ABNORMAL HIGH (ref 0–16)

## 2021-07-05 MED ORDER — TIOTROPIUM BROMIDE MONOHYDRATE 2.5 MCG/ACT IN AERS: 2.0000 | INHALATION_SPRAY | Freq: Every day | RESPIRATORY_TRACT | Status: DC

## 2021-07-05 MED ORDER — ACETAMINOPHEN 650 MG RE SUPP: 650.0000 mg | Freq: Four times a day (QID) | RECTAL | Status: DC | PRN

## 2021-07-05 MED ORDER — TRAZODONE HCL 50 MG PO TABS
50.0000 mg | ORAL_TABLET | Freq: Every day | ORAL | Status: DC
  Administered 2021-07-05 – 2021-07-09 (×5): 50 mg via ORAL
  Filled 2021-07-05 (×6): qty 1

## 2021-07-05 MED ORDER — FENTANYL CITRATE PF 50 MCG/ML IJ SOSY: 25.0000 ug | PREFILLED_SYRINGE | INTRAMUSCULAR | Status: DC | PRN

## 2021-07-05 MED ORDER — LACTATED RINGERS IV BOLUS (SEPSIS)
2000.0000 mL | Freq: Once | INTRAVENOUS | Status: AC
  Administered 2021-07-05: 2000 mL via INTRAVENOUS

## 2021-07-05 MED ORDER — GABAPENTIN 300 MG PO CAPS
600.0000 mg | ORAL_CAPSULE | Freq: Three times a day (TID) | ORAL | Status: DC
  Administered 2021-07-05 – 2021-07-10 (×14): 600 mg via ORAL
  Filled 2021-07-05: qty 6
  Filled 2021-07-05 (×13): qty 2

## 2021-07-05 MED ORDER — MAGIC MOUTHWASH
2.0000 mL | Freq: Once | ORAL | Status: AC
  Administered 2021-07-06: 2 mL via ORAL

## 2021-07-05 MED ORDER — ONDANSETRON HCL 4 MG PO TABS
4.0000 mg | ORAL_TABLET | Freq: Four times a day (QID) | ORAL | Status: DC | PRN
  Administered 2021-07-15: 4 mg via ORAL
  Filled 2021-07-05: qty 1

## 2021-07-05 MED ORDER — SODIUM CHLORIDE 0.9 % IV SOLN
INTRAVENOUS | Status: DC | PRN
  Administered 2021-07-05: 1000 mL via INTRAVENOUS

## 2021-07-05 MED ORDER — FENTANYL CITRATE PF 50 MCG/ML IJ SOSY
25.0000 ug | PREFILLED_SYRINGE | Freq: Once | INTRAMUSCULAR | Status: AC
  Administered 2021-07-05: 25 ug via INTRAVENOUS
  Filled 2021-07-05: qty 1

## 2021-07-05 MED ORDER — PREDNISONE 10 MG PO TABS
5.0000 mg | ORAL_TABLET | Freq: Once | ORAL | Status: AC
  Administered 2021-07-05: 5 mg via ORAL
  Filled 2021-07-05: qty 1

## 2021-07-05 MED ORDER — TRAMADOL HCL 50 MG PO TABS
50.0000 mg | ORAL_TABLET | Freq: Four times a day (QID) | ORAL | Status: DC | PRN
  Administered 2021-07-05: 100 mg via ORAL
  Administered 2021-07-06: 50 mg via ORAL
  Administered 2021-07-06 – 2021-07-09 (×10): 100 mg via ORAL
  Administered 2021-07-11: 50 mg via ORAL
  Administered 2021-07-12 – 2021-07-13 (×3): 100 mg via ORAL
  Administered 2021-07-13: 50 mg via ORAL
  Administered 2021-07-13 – 2021-07-14 (×3): 100 mg via ORAL
  Administered 2021-07-15: 50 mg via ORAL
  Filled 2021-07-05 (×2): qty 2
  Filled 2021-07-05: qty 1
  Filled 2021-07-05: qty 2
  Filled 2021-07-05: qty 1
  Filled 2021-07-05 (×3): qty 2
  Filled 2021-07-05: qty 1
  Filled 2021-07-05 (×13): qty 2

## 2021-07-05 MED ORDER — TAMSULOSIN HCL 0.4 MG PO CAPS
0.4000 mg | ORAL_CAPSULE | Freq: Every day | ORAL | Status: DC
  Administered 2021-07-05 – 2021-07-14 (×9): 0.4 mg via ORAL
  Filled 2021-07-05 (×9): qty 1

## 2021-07-05 MED ORDER — FLUCONAZOLE 100 MG PO TABS
400.0000 mg | ORAL_TABLET | Freq: Every day | ORAL | Status: DC
  Administered 2021-07-05: 400 mg via ORAL
  Filled 2021-07-05: qty 4

## 2021-07-05 MED ORDER — FOLIC ACID 1 MG PO TABS
1.0000 mg | ORAL_TABLET | Freq: Every day | ORAL | Status: DC
  Administered 2021-07-06 – 2021-07-14 (×9): 1 mg via ORAL
  Filled 2021-07-05 (×10): qty 1

## 2021-07-05 MED ORDER — METRONIDAZOLE 500 MG/100ML IV SOLN
500.0000 mg | Freq: Once | INTRAVENOUS | Status: AC
  Administered 2021-07-05: 500 mg via INTRAVENOUS
  Filled 2021-07-05: qty 100

## 2021-07-05 MED ORDER — ALBUTEROL SULFATE (2.5 MG/3ML) 0.083% IN NEBU: 2.5000 mg | INHALATION_SOLUTION | Freq: Four times a day (QID) | RESPIRATORY_TRACT | Status: DC | PRN

## 2021-07-05 MED ORDER — SERTRALINE HCL 100 MG PO TABS
100.0000 mg | ORAL_TABLET | Freq: Every day | ORAL | Status: DC
  Administered 2021-07-06 – 2021-07-09 (×4): 100 mg via ORAL
  Filled 2021-07-05 (×6): qty 1

## 2021-07-05 MED ORDER — VANCOMYCIN HCL IN DEXTROSE 1-5 GM/200ML-% IV SOLN
1000.0000 mg | Freq: Once | INTRAVENOUS | Status: DC
  Filled 2021-07-05: qty 200

## 2021-07-05 MED ORDER — POTASSIUM CHLORIDE CRYS ER 20 MEQ PO TBCR
40.0000 meq | EXTENDED_RELEASE_TABLET | Freq: Once | ORAL | Status: AC
  Administered 2021-07-05: 40 meq via ORAL
  Filled 2021-07-05: qty 2

## 2021-07-05 MED ORDER — SODIUM CHLORIDE 0.9 % IV SOLN
2.0000 g | Freq: Three times a day (TID) | INTRAVENOUS | Status: DC
  Administered 2021-07-05 – 2021-07-08 (×9): 2 g via INTRAVENOUS
  Filled 2021-07-05 (×9): qty 2

## 2021-07-05 MED ORDER — VANCOMYCIN HCL 1500 MG/300ML IV SOLN
1500.0000 mg | INTRAVENOUS | Status: DC
  Filled 2021-07-05: qty 300

## 2021-07-05 MED ORDER — LACTATED RINGERS IV SOLN: INTRAVENOUS | Status: AC

## 2021-07-05 MED ORDER — ONDANSETRON HCL 4 MG/2ML IJ SOLN: 4.0000 mg | Freq: Four times a day (QID) | INTRAMUSCULAR | Status: DC | PRN

## 2021-07-05 MED ORDER — PHENOL 1.4 % MT LIQD
1.0000 | OROMUCOSAL | Status: DC | PRN
  Filled 2021-07-05: qty 177

## 2021-07-05 MED ORDER — SIMVASTATIN 40 MG PO TABS
40.0000 mg | ORAL_TABLET | Freq: Every day | ORAL | Status: DC
  Administered 2021-07-05 – 2021-07-09 (×5): 40 mg via ORAL
  Filled 2021-07-05 (×5): qty 1

## 2021-07-05 MED ORDER — NINTEDANIB ESYLATE 150 MG PO CAPS
1.0000 | ORAL_CAPSULE | Freq: Two times a day (BID) | ORAL | Status: DC
  Administered 2021-07-05: 150 mg via ORAL

## 2021-07-05 MED ORDER — UMECLIDINIUM BROMIDE 62.5 MCG/INH IN AEPB
1.0000 | INHALATION_SPRAY | Freq: Every day | RESPIRATORY_TRACT | Status: DC
  Administered 2021-07-06: 1 via RESPIRATORY_TRACT
  Filled 2021-07-05: qty 7

## 2021-07-05 MED ORDER — ACETAMINOPHEN 500 MG PO TABS
1000.0000 mg | ORAL_TABLET | Freq: Once | ORAL | Status: AC
  Administered 2021-07-05: 1000 mg via ORAL
  Filled 2021-07-05: qty 2

## 2021-07-05 MED ORDER — ACETAMINOPHEN 325 MG PO TABS
650.0000 mg | ORAL_TABLET | Freq: Four times a day (QID) | ORAL | Status: DC | PRN
  Administered 2021-07-05 – 2021-07-14 (×6): 650 mg via ORAL
  Filled 2021-07-05 (×7): qty 2

## 2021-07-05 MED ORDER — FLUTICASONE FUROATE-VILANTEROL 100-25 MCG/INH IN AEPB
1.0000 | INHALATION_SPRAY | Freq: Every day | RESPIRATORY_TRACT | Status: DC
  Administered 2021-07-06 – 2021-07-15 (×10): 1 via RESPIRATORY_TRACT
  Filled 2021-07-05: qty 28

## 2021-07-05 MED ORDER — VANCOMYCIN HCL IN DEXTROSE 1-5 GM/200ML-% IV SOLN
1000.0000 mg | INTRAVENOUS | Status: AC
  Administered 2021-07-05 (×2): 1000 mg via INTRAVENOUS
  Filled 2021-07-05 (×2): qty 200

## 2021-07-05 MED ORDER — DULOXETINE HCL 30 MG PO CPEP
60.0000 mg | ORAL_CAPSULE | Freq: Every evening | ORAL | Status: DC
  Administered 2021-07-05 – 2021-07-09 (×5): 60 mg via ORAL
  Filled 2021-07-05 (×5): qty 1

## 2021-07-05 MED ORDER — FINASTERIDE 5 MG PO TABS
5.0000 mg | ORAL_TABLET | Freq: Every day | ORAL | Status: DC
  Administered 2021-07-05 – 2021-07-14 (×9): 5 mg via ORAL
  Filled 2021-07-05 (×9): qty 1

## 2021-07-05 MED ORDER — SODIUM CHLORIDE 0.9 % IV SOLN
2.0000 g | Freq: Once | INTRAVENOUS | Status: AC
  Administered 2021-07-05: 2 g via INTRAVENOUS
  Filled 2021-07-05: qty 2

## 2021-07-05 MED ORDER — GUAIFENESIN-DM 100-10 MG/5ML PO SYRP
5.0000 mL | ORAL_SOLUTION | ORAL | Status: DC | PRN
  Filled 2021-07-05: qty 10

## 2021-07-05 MED ORDER — ALBUTEROL SULFATE HFA 108 (90 BASE) MCG/ACT IN AERS: 2.0000 | INHALATION_SPRAY | Freq: Four times a day (QID) | RESPIRATORY_TRACT | Status: DC | PRN

## 2021-07-05 MED ORDER — ACETAMINOPHEN 325 MG PO TABS
650.0000 mg | ORAL_TABLET | Freq: Four times a day (QID) | ORAL | Status: DC | PRN
Start: 2021-07-05 — End: 2021-07-05

## 2021-07-05 MED ORDER — LACTATED RINGERS IV SOLN: INTRAVENOUS | Status: DC

## 2021-07-05 MED ORDER — MAGIC MOUTHWASH
5.0000 mL | Freq: Three times a day (TID) | ORAL | Status: DC | PRN
  Administered 2021-07-06 – 2021-07-09 (×3): 5 mL via ORAL
  Filled 2021-07-05 (×6): qty 5

## 2021-07-05 NOTE — ED Notes (Signed)
Pt aware urine specimen ordered. Pt reports inability to provide specimen at this time.

## 2021-07-05 NOTE — ED Notes (Signed)
Pt requesting pain medicine, wife reports pt take multiple pain meds daily. Message sent to [place], [place]

## 2021-07-05 NOTE — ED Notes (Signed)
HR at 1630 is error, unable to remove

## 2021-07-05 NOTE — Progress Notes (Signed)
Pharmacy Antibiotic Note  [name] is a 80 y.o. male admitted on 07/24/2021 with sepsis.  Pharmacy has been consulted for cefepime and vancomycin dosing.  Patient with a history of rheumatoid arthritis, interstitial lung disease on 4 L of oxygen COPD, enlarged pulmonary artery, coronary artery disease, OSA, hyperlipidemia, history of splenectomy. Patient presenting with 2 wks of sore throat, congestion, and throat ulcers.  SCr is 0.87 which is at baseline WBC 1.5; LA 1.3; T 102 F  Plan: Flagyl per MD Cefepime 2g q8h Vancomycin 2000 mg once then 1500 mg q24h (eAUC 517) unless change in renal function Trend WBC, fever, renal function, clinical course F/u cultures Levels at steady state De-escalate when able     Temp (24hrs), Avg:102 F (38.9 C), Min:102 F (38.9 C), Max:102 F (38.9 C)  No results for input(s): WBC, CREATININE, LATICACIDVEN, VANCOTROUGH, VANCOPEAK, VANCORANDOM, GENTTROUGH, GENTPEAK, GENTRANDOM, TOBRATROUGH, TOBRAPEAK, TOBRARND, AMIKACINPEAK, AMIKACINTROU, AMIKACIN in the last 168 hours.  CrCl cannot be calculated (Patient's most recent lab result is older than the maximum 21 days allowed.).    Allergies  Allergen Reactions   Sulfa Antibiotics Shortness Of Breath   Sulfamethoxazole-Trimethoprim Shortness Of Breath   Sulfasalazine Shortness Of Breath   Other Other (See Comments)   Varenicline Other (See Comments)     Strange thoughts SUICIDAL   Lisinopril Cough   Antimicrobials this admission: Metronidazole 10/9 >>  Cefepime 10/9 >>  Vancomycin 10/9 >>  Microbiology results: Pending  Thank you for allowing pharmacy to be a part of this patient's care.  [name] 07/23/2021 11:17 AM

## 2021-07-05 NOTE — ED Notes (Signed)
Pt attempted to get urine sample, but was unable

## 2021-07-05 NOTE — Progress Notes (Signed)
Received pt from [facility], in stable condition. Pt a/o, Tele placed and verified, O2@ 6 liters. Urine collected and sent as previous ordered. Dr. Olevia Bowens ordered diet and [facility] team of admission. SKIN checked. Lower legs swelling noted, pt has cough while eating, states maybe related to mouth sores. Speech consult. SRP, RN

## 2021-07-05 NOTE — ED Provider Notes (Addendum)
[facility] Provider Note   CSN: [id number] Arrival date & time: 07/27/2021  1018     History Chief Complaint  Patient presents with   Fever    [name] is a 80 y.o. male.  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  Was seen at urgent care initially for sore throat, was given zpac, prednisone, hycodan, mucinex,  clotrimazole then seen by PCP and started on magic mouth wash.   Reports the symptoms have continued. Has cough which is different than usual, feels like needs to clear throat a lot, congestion and congestion ni the back of the throat.  Has dyspnea that is worse today  Woke up this AM feeling shaky, chills and found he had a fever.  Has not had dysuria Had not started a new medication prior to oral pharyngeal lesions starting. No rash.  No nausea/vomiting/diarrhea.     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/2017   OSA on CPAP    wears CPAP   Pneumonia    "I've had it several times; last time was end of Jan 2018" (05/02/2017)   Pre-diabetes    Prostate cancer ([place])    Rheumatoid arteritis ([place])    "all over" (05/02/2017)   Thrush  12/07/2017   Wears glasses     Patient Active Problem List   Diagnosis Date Noted   Neutropenic fever ([place]) 07/04/2021   Pancytopenia ([place]) 06/29/2021   Chronic lower back pain    Elevated transaminase level    Depression    Medication management 03/21/2020   Abdominal tenderness, RUQ (right upper quadrant) 02/22/2019   Lumbar adjacent segment disease with spondylolisthesis 07/10/2018   ILD (interstitial lung disease) ([place]) 07/04/2018   Dyspnea 06/13/2018   Cough due to ACE inhibitor 06/13/2018   Former tobacco use 06/13/2018   Acute sinusitis 05/30/2018   Thrush 12/07/2017   Tendinopathy of right gluteus medius 11/02/2017   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[place]) 72/62/0355   Hardware complicating wound infection ([place])    Vertebral osteomyelitis ([place])    Pseudomonas aeruginosa infection    Rheumatoid arthritis involving multiple sites with positive rheumatoid factor (HCC)    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/2017   Hypoxia    Lumbar degenerative disc disease 02/17/2017   Cigarette smoker 11/17/2016   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   Allergic rhinitis 03/23/2008   HYPERLIPIDEMIA 03/15/2008   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   PNEUMONIA 03/14/2008   GOLD COPD II B 03/14/2008   Rheumatoid arthritis ([place]) 03/14/2008   OSA on CPAP 03/14/2008    Past Surgical History:  Procedure Laterality Date  BACK SURGERY     COLONOSCOPY W/ POLYPECTOMY     FRACTURE SURGERY     HERNIA REPAIR     HIP ARTHROPLASTY Left 04/2016   INGUINAL HERNIA REPAIR Left 1986   JOINT REPLACEMENT     LUMBAR WOUND DEBRIDEMENT N/A 04/18/2017   Procedure: REPAIR OF LUMBAR PSEUDOMENINGOCELE;  Surgeon: Michael Pies, MD;  Location: [facility];  Service: Neurosurgery;  Laterality: N/A;  REAIR OF LUMBAR PSEUDOMENINGOCELE   LUMBAR WOUND DEBRIDEMENT N/A 05/05/2017   Procedure: I&D Lumbar Wound;  Surgeon: Michael Pies, MD;  Location: [facility];  Service: Neurosurgery;  Laterality:  N/A;   MAXIMUM ACCESS (MAS)POSTERIOR LUMBAR INTERBODY FUSION (PLIF) 3 LEVEL  02/17/2017   [name] 02/17/2017   OPEN SURGICAL REPAIR OF GLUTEAL TENDON Right 11/02/2017   Procedure: Right hip gluteal tendon repair;  Surgeon: Michael Arabian, MD;  Location: [facility];  Service: Orthopedics;  Laterality: Right;   POSTERIOR LAMINECTOMY / DECOMPRESSION LUMBAR SPINE  2010   PROSTATE BIOPSY     PROSTATE BIOPSY  <2013 X 3   RIGHT HEART CATH AND CORONARY ANGIOGRAPHY N/A 05/27/2020   Procedure: RIGHT HEART CATH AND CORONARY ANGIOGRAPHY;  Surgeon: Michael Dresser, MD;  Location: [facility];  Service: Cardiovascular;  Laterality: N/A;   SPLENECTOMY  1955   TONSILLECTOMY     TOTAL HIP ARTHROPLASTY Right 40/1027   UMBILICAL HERNIA REPAIR  05/2016       Family History  Problem Relation Age of Onset   Cancer Mother    Heart disease Father    Stroke Father     Social History   Tobacco Use   Smoking status: Former    Packs/day: 1.50    Years: 54.00    Pack years: 81.00    Types: Cigarettes    Start date: 02/21/1961    Quit date: 09/27/2014    Years since quitting: 6.7   Smokeless tobacco: Never   Tobacco comments:    05/02/2017 "quit vaping 09/2016"  Vaping Use   Vaping Use: Former   Start date: 02/22/2016   Quit date: 02/21/2017   Devices: used to quit smoking then quit vaping  Substance Use Topics   Alcohol use: Yes    Alcohol/week: 17.0 standard drinks    Types: 7 Cans of beer, 10 Shots of liquor per week    Comment: Daily shots, beer etc   Drug use: No    Home Medications Prior to Admission medications   Medication Sig Start Date End Date Taking? Authorizing Provider  albuterol (PROVENTIL HFA;VENTOLIN HFA) 108 (90 Base) MCG/ACT inhaler Inhale 2 puffs into the lungs every 6 (six) hours as needed for wheezing or shortness of breath.   Yes [provider]  BREO ELLIPTA 100-25 MCG/INH AEPB INHALE 1 PUFF INTO LUNGS DAILY Patient taking differently: Inhale 1 puff into the lungs  daily. 04/16/21  Yes Buchanan, Praveen, MD  DULoxetine (CYMBALTA) 60 MG capsule Take 60 mg by mouth every evening.    Yes [provider]  finasteride (PROSCAR) 5 MG tablet Take 5 mg by mouth at bedtime.    Yes [provider]  folic acid (FOLVITE) 1 MG tablet Take 1 mg by mouth daily. 06/29/21  Yes [provider]  furosemide (LASIX) 20 MG tablet Take 1 tablet (82m total) daily for next 7 days for fluid management Patient taking differently: Take 20 mg by mouth daily. 04/05/19  Yes MLauraine Rinne NP  gabapentin (NEURONTIN) 300 MG capsule Take 600 mg by mouth 3 (  three) times daily.   Yes [provider]  guaifenesin (HUMIBID E) 400 MG TABS tablet Take 400 mg by mouth at bedtime.   Yes [provider]  ibuprofen (ADVIL) 200 MG tablet Take 600-800 mg by mouth daily as needed for mild pain or moderate pain.   Yes [provider]  lidocaine (XYLOCAINE) 2 % solution Use as directed 15 mLs in the mouth or throat every 6 (six) hours as needed for mouth pain. 07/01/21  Yes [provider]  losartan (COZAAR) 25 MG tablet TAKE ONE (1) TABLET BY MOUTH EACH DAY 04/30/21  Yes Allred, [name], MD  metFORMIN (GLUCOPHAGE) 500 MG tablet Take 500 mg by mouth daily.  04/24/19  Yes [provider]  methotrexate (RHEUMATREX) 2.5 MG tablet Take 25 mg by mouth once a week. Take 10 tablets (25 mg) Once A Weekly on Saturday. Caution:Chemotherapy. Protect from light.   Yes [provider]  metoprolol succinate (TOPROL-XL) 50 MG 24 hr tablet TAKE ONE TABLET BY MOUTH TWICE DAILY WITH OR IMMEDIATELY FOLLOWING A MEAL Patient taking differently: Take 50 mg by mouth in the morning and at bedtime. 07/15/20  Yes Allred, [name], MD  predniSONE (DELTASONE) 10 MG tablet Take 20-60 mg by mouth as directed. TAKE 6 TABS DAILY FOR 4 DAYS, THEN TAKE 4 TABS DAILY FOR 4 DAYS, THEN TAKE 2 TABS DAILY FOR 4 DAYS. TAKE WITH FOOD. 06/26/21  Yes [provider]  sertraline  (ZOLOFT) 100 MG tablet Take 100 mg by mouth daily. 03/26/21  Yes [provider]  simvastatin (ZOCOR) 40 MG tablet Take 40 mg by mouth at bedtime.    Yes [provider]  SPIRIVA RESPIMAT 2.5 MCG/ACT AERS INHALE TWO (2) PUFFS INTO THE LUNGS ONCE DAILY Patient taking differently: Take 2 puffs by mouth daily. 02/02/21  Yes Lauraine Rinne, NP  tamsulosin (FLOMAX) 0.4 MG CAPS capsule Take 0.4 mg by mouth at bedtime.    Yes [provider]  traMADol (ULTRAM) 50 MG tablet Take 50-100 mg by mouth every 6 (six) hours as needed for moderate pain or severe pain.  03/03/20  Yes [provider]  traZODone (DESYREL) 50 MG tablet Take 50 mg by mouth at bedtime.   Yes [provider]  aspirin EC 81 MG tablet Take 81 mg by mouth daily. Swallow whole.    [provider]  fluticasone (FLONASE) 50 MCG/ACT nasal spray Place 2 sprays into both nostrils daily as needed for allergies.     [provider]  OFEV 150 MG CAPS TAKE 1 CAPSULE BY MOUTH TWICE A DAY 05/04/21   Brand Males, MD    Allergies    Sulfa antibiotics, Sulfamethoxazole-trimethoprim, Sulfasalazine, Other, Varenicline, and Lisinopril  Review of Systems   Review of Systems  Constitutional:  Positive for activity change, appetite change, fatigue and fever.  HENT:  Positive for congestion and sore throat.   Eyes:  Negative for visual disturbance.  Respiratory:  Positive for cough (chronic, different) and shortness of breath (chronic, worse today).   Cardiovascular:  Negative for chest pain and leg swelling.  Gastrointestinal:  Negative for abdominal pain, nausea and vomiting.  Genitourinary:  Negative for difficulty urinating.  Musculoskeletal:  Negative for back pain and neck stiffness.  Skin:  Negative for rash.  Neurological:  Negative for syncope and headaches.   Physical Exam Updated Vital Signs BP 127/62 (BP Location: Left Arm)   Pulse 75   Temp 98.7 F (37.1 C) (Oral)   Resp 20  Ht _0  (1.702 m)   Wt 100.3 kg   SpO2 100%   BMI 34.63 kg/m   Physical Exam Vitals and nursing note reviewed.  Constitutional:      General: He is not in acute distress.    Appearance: He is well-developed. He is not diaphoretic.  HENT:     Head: Normocephalic and atraumatic.     Mouth/Throat:     Comments: Ulcerations to palate, erythema Midline uvula Eyes:     Conjunctiva/sclera: Conjunctivae normal.  Cardiovascular:     Rate and Rhythm: Normal rate and regular rhythm.     Heart sounds: Normal heart sounds. No murmur heard.   No friction rub. No gallop.  Pulmonary:     Effort: Pulmonary effort is normal. No respiratory distress.     Breath sounds: Normal breath sounds. No wheezing or rales.  Abdominal:     General: There is no distension.     Palpations: Abdomen is soft.     Tenderness: There is no abdominal tenderness. There is no guarding.  Musculoskeletal:     Cervical back: Normal range of motion.     Right lower leg: Edema present.     Left lower leg: Edema (reports unchanged) present.  Skin:    General: Skin is warm and dry.  Neurological:     Mental Status: He is alert and oriented to person, place, and time.    ED Results / Procedures / Treatments   Labs (all labs ordered are listed, but only abnormal results are displayed) Labs Reviewed  COMPREHENSIVE METABOLIC PANEL - Abnormal; Notable for the following components:      Result Value   Sodium 134 (*)    Potassium 3.0 (*)    Chloride 96 (*)    Calcium 8.4 (*)    Total Protein 6.3 (*)    Albumin 3.3 (*)    AST 44 (*)    ALT 77 (*)    All other components within normal limits  APTT - Abnormal; Notable for the following components:   aPTT 23 (*)    All other components within normal limits  CBC WITH DIFFERENTIAL/PLATELET - Abnormal; Notable for the following components:   WBC 1.5 (*)    RBC 2.64 (*)    Hemoglobin 10.3 (*)    HCT 30.1 (*)    MCV 114.0 (*)    MCH 39.0 (*)    Platelets 33 (*)     nRBC 3.8 (*)    Neutro Abs 0.6 (*)    Lymphs Abs 0.6 (*)    All other components within normal limits  RESP PANEL BY RT-PCR (FLU A&B, COVID) ARPGX2  RESPIRATORY PANEL BY PCR  CULTURE, BLOOD (ROUTINE X 2)  CULTURE, BLOOD (ROUTINE X 2)  URINE CULTURE  LACTIC ACID, PLASMA  PROTIME-INR  URINALYSIS, ROUTINE W REFLEX MICROSCOPIC  CBC WITH DIFFERENTIAL/PLATELET  PATHOLOGIST SMEAR REVIEW  COMPREHENSIVE METABOLIC PANEL  MAGNESIUM  CBC WITH DIFFERENTIAL/PLATELET  PROCALCITONIN  PROCALCITONIN  C-REACTIVE PROTEIN  C-REACTIVE PROTEIN  SEDIMENTATION RATE  SEDIMENTATION RATE  TYPE AND SCREEN    EKG EKG Interpretation  Date/Time:  Sunday July 05 2021 11:10:06 EDT Ventricular Rate:  94 PR Interval:  141 QRS Duration: 108 QT Interval:  344 QTC Calculation: 431 R Axis:   -58 Text Interpretation: Sinus tachycardia Supraventricular bigeminy Left anterior fascicular block Abnormal R-wave progression, late transition Borderline repolarization abnormality Confirmed by Gareth Morgan [phone]) on 07/07/2021 12:19:22 PM  Radiology DG Chest Port 1 View  Result Date:  07/22/2021 CLINICAL DATA:  Questionable sepsis. EXAM: PORTABLE CHEST 1 VIEW COMPARISON:  July 13, 2018 FINDINGS: The heart size and mediastinal contours are stable. There is no focal infiltrate or pulmonary edema. Probable minimal right pleural effusion is identified. Mild linear scar is identified in the left lung base. The visualized skeletal structures are unremarkable. IMPRESSION: No focal pneumonia. Probable minimal right pleural effusion. Electronically Signed   By: Abelardo Diesel M.D.   On: 07/22/2021 11:19    Procedures .Critical Care Performed by: Gareth Morgan, MD Authorized by: Gareth Morgan, MD   Critical care provider statement:    Critical care time (minutes):  45   Critical care was necessary to treat or prevent imminent or life-threatening deterioration of the following conditions:  Sepsis   Critical  care was time spent personally by me on the following activities:  Discussions with consultants, evaluation of patient's response to treatment, examination of patient, ordering and performing treatments and interventions, ordering and review of laboratory studies, ordering and review of radiographic studies, pulse oximetry, re-evaluation of patient's condition, obtaining history from patient or surrogate and review of old charts   Medications Ordered in ED Medications  0.9 %  sodium chloride infusion (0 mLs Intravenous Stopped 07/07/2021 1349)  ceFEPIme (MAXIPIME) 2 g in sodium chloride 0.9 % 100 mL IVPB (2 g Intravenous New Bag/Given 07/14/2021 2113)  magic mouthwash (2 mLs Oral Not Given 07/11/2021 1504)  lactated ringers infusion ( Intravenous New Bag/Given 07/10/2021 2119)  Nintedanib (OFEV) CAPS 150 mg (150 mg Oral Given 06/29/2021 2118)  traMADol (ULTRAM) tablet 50-100 mg (100 mg Oral Given 07/26/2021 2106)  simvastatin (ZOCOR) tablet 40 mg (40 mg Oral Given 07/16/2021 2106)  DULoxetine (CYMBALTA) DR capsule 60 mg (60 mg Oral Given 07/23/2021 2107)  sertraline (ZOLOFT) tablet 100 mg (has no administration in time range)  traZODone (DESYREL) tablet 50 mg (50 mg Oral Given 07/07/2021 2106)  finasteride (PROSCAR) tablet 5 mg (5 mg Oral Given 07/12/2021 2107)  tamsulosin (FLOMAX) capsule 0.4 mg (0.4 mg Oral Given 80/1/65 5374)  folic acid (FOLVITE) tablet 1 mg (has no administration in time range)  gabapentin (NEURONTIN) capsule 600 mg (600 mg Oral Given 07/04/2021 2113)  fluticasone furoate-vilanterol (BREO ELLIPTA) 100-25 MCG/INH 1 puff (has no administration in time range)  guaiFENesin-dextromethorphan (ROBITUSSIN DM) 100-10 MG/5ML syrup 5 mL (has no administration in time range)  magic mouthwash (has no administration in time range)  fluconazole (DIFLUCAN) tablet 400 mg (400 mg Oral Given 07/20/2021 2105)  acetaminophen (TYLENOL) tablet 650 mg (650 mg Oral Given 07/24/2021 2107)    Or  acetaminophen (TYLENOL) suppository 650  mg ( Rectal See Alternative 06/29/2021 2107)  ondansetron (ZOFRAN) tablet 4 mg (has no administration in time range)    Or  ondansetron (ZOFRAN) injection 4 mg (has no administration in time range)  phenol (CHLORASEPTIC) mouth spray 1 spray (has no administration in time range)  albuterol (PROVENTIL) (2.5 MG/3ML) 0.083% nebulizer solution 2.5 mg (has no administration in time range)  umeclidinium bromide (INCRUSE ELLIPTA) 62.5 MCG/INH 1 puff (has no administration in time range)  lactated ringers bolus 2,000 mL (0 mLs Intravenous Stopped 07/10/2021 1208)  ceFEPIme (MAXIPIME) 2 g in sodium chloride 0.9 % 100 mL IVPB (0 g Intravenous Stopped 07/02/2021 1157)  metroNIDAZOLE (FLAGYL) IVPB 500 mg (0 mg Intravenous Stopped 07/20/2021 1304)  vancomycin (VANCOCIN) IVPB 1000 mg/200 mL premix (0 mg Intravenous Stopped 07/12/2021 1551)  acetaminophen (TYLENOL) tablet 1,000 mg (1,000 mg Oral Given 07/26/2021 1234)  fentaNYL (SUBLIMAZE) injection  25 mcg (25 mcg Intravenous Given 07/16/2021 1347)  potassium chloride SA (KLOR-CON) CR tablet 40 mEq (40 mEq Oral Given 07/13/2021 1349)  predniSONE (DELTASONE) tablet 5 mg (5 mg Oral Given 07/14/2021 1636)    ED Course  I have reviewed the triage vital signs and the nursing notes.  Pertinent labs & imaging results that were available during my care of the patient were reviewed by me and considered in my medical decision making (see chart for details).    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  On arrival, temperature 102, BP 91/52. Concern for possible sepsis and ordered empiric abx and fluids--however BP improved with 500cc fluid and fluid  discontinued given improvement and tachypnea/O2 requirement. Lactic acid normal.  No sign of pneumonia on CXR, awaiting urine sample. Doubt RPA/PTA, possible bacterial component of pharyngitis and consider fungal etiology.    Labs significant for new pancytopenia, including neutropenia, thrombocytopenia.  Discussed with Dr. Marin Olp, Hematology/oncology to follow in the hospital.  Will admit with concern for fever and neutropenia, r/o sepsis.    Discussed goals of care, reports he is DNR and also does not desire intubation/life support.   Final Clinical Impression(s) / ED Diagnoses Final diagnoses:  Fever and neutropenia ([place])  Other pancytopenia [facility])    Rx / DC Orders ED Discharge Orders     None        Gareth Morgan, MD 07/01/2021 2137    Gareth Morgan, MD 07/23/2021 2138

## 2021-07-05 NOTE — Progress Notes (Signed)
Dr. Myna Hidalgo is admittting MD. Darcella Cheshire

## 2021-07-05 NOTE — Progress Notes (Signed)
RESP Panel negative, Droplet PPE discontinued per protocol.

## 2021-07-05 NOTE — ED Triage Notes (Signed)
Pt reports fever (102.0) this morning,shaky and thrush in mouth x 10 days. Increase in cough noted.

## 2021-07-05 NOTE — H&P (Signed)
History and Physical    [name] OIP:[id number] DOB: 1940/10/12 DOA: 07/02/2021  PCP: Javier Glazier, MD   Patient coming from: ILD  Chief Complaint: Fever, chills, fatigue, mouth and throat pain   HPI: [name] is a very pleasant 80 y.o. male with medical history significant for rheumatoid arthritis, interstitial lung disease, COPD, OSA, chronic hypoxic respiratory failure, hypertension, multifocal atrial tachycardia, chronic back pain, and history of hardware associated vertebral osteomyelitis, now presenting to the emergency department for evaluation of fever, chills, fatigue, and mouth and throat pain.  The patient reports that he developed pain in his mouth and throat roughly 10 days ago, noted some white plaque involving the buccal mucosa, and also noted some ulcers in his mouth.  He had increase in his chronic cough around the same time, was evaluated in an outpatient clinic, started on clotrimazole troches, azithromycin, and prednisone taper.  The mouth and throat pain has not seemed to improve with treatment but he has some transient relief with anesthetic mouthwash.  He has not been more short of breath than usual, denies purulent sputum, denies abdominal pain, denies dysuria, and has not noticed any rash or wounds.  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  He developed fever, chills, and malaise this morning and was advised by an RN at his ILF to seek evaluation in the ED.  [facility] ED Course: Upon arrival to the ED, patient is found to be febrile to 38.9 C, saturating low to mid 90s on his usual 4 L/min of supplemental oxygen, transiently tachycardic, and with blood pressure 91/52.  EKG features atrial rhythm with LAFB.  Chest x-ray with possible minimal right pleural effusion but no focal pneumonia.  Chemistry panel with potassium 3.0 and mild elevation in transaminases.  CBC is concerning for a new  pancytopenia with WBC 1500, hemoglobin 10.3, and platelets 33,000.  Lactic acid is normal.  Blood cultures were collected in the emergency department and the patient was treated with vancomycin, cefepime, Flagyl, oral potassium, 2 L of IV fluids, acetaminophen, fentanyl, and prednisone.  He was transferred to [facility] for ongoing evaluation and management.  Review of Systems:  All other systems reviewed and apart from HPI, are negative.  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   OSA on CPAP    wears CPAP   Pneumonia    "I've had it several times; last time was end of Jan 2018" (05/02/2017)   Pre-diabetes    Prostate cancer ([place])    Rheumatoid arteritis ([place])    "all over" (05/02/2017)   Thrush 12/07/2017   Wears glasses     Past Surgical History:  Procedure Laterality Date   BACK SURGERY     COLONOSCOPY W/ POLYPECTOMY     FRACTURE SURGERY     HERNIA REPAIR  HIP ARTHROPLASTY Left 04/2016   INGUINAL HERNIA REPAIR Left 1986   JOINT REPLACEMENT     LUMBAR WOUND DEBRIDEMENT N/A 04/18/2017   Procedure: REPAIR OF LUMBAR PSEUDOMENINGOCELE;  Surgeon: Newman Pies, MD;  Location: [place];  Service: Neurosurgery;  Laterality: N/A;  REAIR OF LUMBAR PSEUDOMENINGOCELE   LUMBAR WOUND DEBRIDEMENT N/A 05/05/2017   Procedure: I&D Lumbar Wound;  Surgeon: Newman Pies, MD;  Location: [place];  Service: Neurosurgery;  Laterality: N/A;   MAXIMUM ACCESS (MAS)POSTERIOR LUMBAR  INTERBODY FUSION (PLIF) 3 LEVEL  02/17/2017   [name] 02/17/2017   OPEN SURGICAL REPAIR OF GLUTEAL TENDON Right 11/02/2017   Procedure: Right hip gluteal tendon repair;  Surgeon: Gaynelle Arabian, MD;  Location: [facility];  Service: Orthopedics;  Laterality: Right;   POSTERIOR LAMINECTOMY / DECOMPRESSION LUMBAR SPINE  2010   PROSTATE BIOPSY     PROSTATE BIOPSY  <2013 X 3   RIGHT HEART CATH AND CORONARY ANGIOGRAPHY N/A 05/27/2020   Procedure: RIGHT HEART CATH AND CORONARY ANGIOGRAPHY;  Surgeon: Larey Dresser, MD;  Location: [place] [facility];  Service: Cardiovascular;  Laterality: N/A;   [place] Right 16/1096   UMBILICAL HERNIA REPAIR  05/2016    Social History:   reports that he quit smoking about 6 years ago. His smoking use included cigarettes. He started smoking about 60 years ago. He has a 81.00 pack-year smoking history. He has never used smokeless tobacco. He reports current alcohol use of about 17.0 standard drinks per week. He reports that he does not use drugs.  Allergies  Allergen Reactions   Sulfa Antibiotics Shortness Of Breath   Sulfamethoxazole-Trimethoprim Shortness Of Breath   Sulfasalazine Shortness Of Breath   Other Other (See Comments)   Varenicline Other (See Comments)     Strange thoughts SUICIDAL   Lisinopril Cough    Family History  Problem Relation Age of Onset   Cancer Mother    Heart disease Father    Stroke Father      Prior to Admission medications   Medication Sig Start Date End Date Taking? Authorizing Provider  albuterol (PROVENTIL HFA;VENTOLIN HFA) 108 (90 Base) MCG/ACT inhaler Inhale 2 puffs into the lungs every 6 (six) hours as needed for wheezing or shortness of breath.   Yes [provider]  BREO ELLIPTA 100-25 MCG/INH AEPB INHALE 1 PUFF INTO LUNGS DAILY Patient taking differently: Inhale 1 puff into the lungs daily. 04/16/21  Yes Mannam, Praveen, MD  DULoxetine (CYMBALTA) 60 MG  capsule Take 60 mg by mouth every evening.    Yes [provider]  finasteride (PROSCAR) 5 MG tablet Take 5 mg by mouth at bedtime.    Yes [provider]  folic acid (FOLVITE) 1 MG tablet Take 1 mg by mouth daily. 06/29/21  Yes [provider]  furosemide (LASIX) 20 MG tablet Take 1 tablet (68m total) daily for next 7 days for fluid management Patient taking differently: Take 20 mg by mouth daily. 04/05/19  Yes MLauraine Rinne NP  gabapentin (NEURONTIN) 300 MG capsule Take 600 mg by mouth 3 (three) times daily.   Yes [provider]  guaifenesin (HUMIBID E) 400 MG TABS tablet Take 400 mg by mouth at bedtime.   Yes [provider]  ibuprofen (ADVIL) 200 MG tablet Take 600-800 mg by mouth daily as needed for mild pain or moderate pain.   Yes [provider]  lidocaine (XYLOCAINE) 2 % solution  Use as directed 15 mLs in the mouth or throat every 6 (six) hours as needed for mouth pain. 07/01/21  Yes [provider]  losartan (COZAAR) 25 MG tablet TAKE ONE (1) TABLET BY MOUTH EACH DAY 04/30/21  Yes Allred, [name], MD  metFORMIN (GLUCOPHAGE) 500 MG tablet Take 500 mg by mouth daily.  04/24/19  Yes [provider]  methotrexate (RHEUMATREX) 2.5 MG tablet Take 25 mg by mouth once a week. Take 10 tablets (25 mg) Once A Weekly on Saturday. Caution:Chemotherapy. Protect from light.   Yes [provider]  metoprolol succinate (TOPROL-XL) 50 MG 24 hr tablet TAKE ONE TABLET BY MOUTH TWICE DAILY WITH OR IMMEDIATELY FOLLOWING A MEAL Patient taking differently: Take 50 mg by mouth in the morning and at bedtime. 07/15/20  Yes Allred, [name], MD  predniSONE (DELTASONE) 10 MG tablet Take 20-60 mg by mouth as directed. TAKE 6 TABS DAILY FOR 4 DAYS, THEN TAKE 4 TABS DAILY FOR 4 DAYS, THEN TAKE 2 TABS DAILY FOR 4 DAYS. TAKE WITH FOOD. 06/26/21  Yes [provider]  sertraline (ZOLOFT) 100 MG tablet Take 100 mg by mouth daily. 03/26/21  Yes  [provider]  simvastatin (ZOCOR) 40 MG tablet Take 40 mg by mouth at bedtime.    Yes [provider]  SPIRIVA RESPIMAT 2.5 MCG/ACT AERS INHALE TWO (2) PUFFS INTO THE LUNGS ONCE DAILY Patient taking differently: Take 2 puffs by mouth daily. 02/02/21  Yes Lauraine Rinne, NP  tamsulosin (FLOMAX) 0.4 MG CAPS capsule Take 0.4 mg by mouth at bedtime.    Yes [provider]  traMADol (ULTRAM) 50 MG tablet Take 50-100 mg by mouth every 6 (six) hours as needed for moderate pain or severe pain.  03/03/20  Yes [provider]  traZODone (DESYREL) 50 MG tablet Take 50 mg by mouth at bedtime.   Yes [provider]  aspirin EC 81 MG tablet Take 81 mg by mouth daily. Swallow whole.    [provider]  fluticasone (FLONASE) 50 MCG/ACT nasal spray Place 2 sprays into both nostrils daily as needed for allergies.     [provider]  OFEV 150 MG CAPS TAKE 1 CAPSULE BY MOUTH TWICE A DAY 05/04/21   Brand Males, MD    Physical Exam: Vitals:   07/10/2021 1630 07/01/2021 1633 07/27/2021 1808 07/11/2021 2009  BP: 125/78  127/62   Pulse: (!) 152 75 75   Resp: _0 Temp:   98.7 F (37.1 C)   TempSrc:   Oral   SpO2: 100% 100% 100%   Weight:    100.3 kg  Height:    _1  (1.702 m)    Constitutional: NAD, calm  Eyes: PERTLA, lids and conjunctivae normal ENMT: Mucous membranes are moist. White plaque involving buccal mucosa, ulcers.   Neck: normal, supple, no masses, no thyromegaly Respiratory: Diminished bilaterally, no wheezing, no crackles. No accessory muscle use.  Cardiovascular: S1 & S2 heard, regular rate and rhythm. No JVD.  Abdomen: No distension, no tenderness, soft. Bowel sounds active.  Musculoskeletal: no clubbing / cyanosis. No joint deformity upper and lower extremities.   Skin: no significant rashes, lesions, ulcers. Warm, dry, well-perfused. Neurologic: CN 2-12 grossly intact. Moving all extremities. Alert and oriented.   Psychiatric: [place]. Cooperative.    Labs and Imaging on Admission: I have personally reviewed following labs and imaging studies  CBC: Recent Labs  Lab 06/27/2021 1209  WBC 1.5*  NEUTROABS 0.6*  HGB  10.3*  HCT 30.1*  MCV 114.0*  PLT 33*   Basic Metabolic Panel: Recent Labs  Lab 07/04/2021 1054  NA 134*  K 3.0*  CL 96*  CO2 29  GLUCOSE 95  BUN 20  CREATININE 0.87  CALCIUM 8.4*   GFR: Estimated Creatinine Clearance: 77.7 mL/min (by C-G formula based on SCr of 0.87 mg/dL). Liver Function Tests: Recent Labs  Lab 07/23/2021 1054  AST 44*  ALT 77*  ALKPHOS 44  BILITOT 1.0  PROT 6.3*  ALBUMIN 3.3*   No results for input(s): LIPASE, AMYLASE in the last 168 hours. No results for input(s): AMMONIA in the last 168 hours. Coagulation Profile: Recent Labs  Lab 07/10/2021 1054  INR 1.0   Cardiac Enzymes: No results for input(s): CKTOTAL, CKMB, CKMBINDEX, TROPONINI in the last 168 hours. BNP (last 3 results) No results for input(s): PROBNP in the last 8760 hours. HbA1C: No results for input(s): HGBA1C in the last 72 hours. CBG: No results for input(s): GLUCAP in the last 168 hours. Lipid Profile: No results for input(s): CHOL, HDL, LDLCALC, TRIG, CHOLHDL, LDLDIRECT in the last 72 hours. Thyroid Function Tests: No results for input(s): TSH, T4TOTAL, FREET4, T3FREE, THYROIDAB in the last 72 hours. Anemia Panel: No results for input(s): VITAMINB12, FOLATE, FERRITIN, TIBC, IRON, RETICCTPCT in the last 72 hours. Urine analysis:    Component Value Date/Time   COLORURINE YELLOW 06/30/2021 1905   APPEARANCEUR CLEAR 07/26/2021 1905   LABSPEC 1.005 06/28/2021 1905   PHURINE 6.0 07/11/2021 1905   GLUCOSEU NEGATIVE 07/11/2021 1905   HGBUR NEGATIVE 06/27/2021 [place] 07/02/2021 [place] 07/20/2021 1905   PROTEINUR NEGATIVE 07/23/2021 1905   NITRITE NEGATIVE 07/20/2021 1905   LEUKOCYTESUR NEGATIVE 06/30/2021 1905   Sepsis  Labs: _0 (procalcitonin:4,lacticidven:4) ) Recent Results (from the past 240 hour(s))  Resp Panel by RT-PCR (Flu A&B, Covid) Nasopharyngeal Swab     Status: None   Collection Time: 07/27/2021 11:42 AM   Specimen: Nasopharyngeal Swab; Nasopharyngeal(NP) swabs in vial transport medium  Result Value Ref Range Status   SARS Coronavirus 2 by RT PCR NEGATIVE NEGATIVE Final    Comment: (NOTE) SARS-CoV-2 target nucleic acids are NOT DETECTED.  The SARS-CoV-2 RNA is generally detectable in upper respiratory specimens during the acute phase of infection. The lowest concentration of SARS-CoV-2 viral copies this assay can detect is 138 copies/mL. A negative result does not preclude SARS-Cov-2 infection and should not be used as the sole basis for treatment or other patient management decisions. A negative result may occur with  improper specimen collection/handling, submission of specimen other than nasopharyngeal swab, presence of viral mutation(s) within the areas targeted by this assay, and inadequate number of viral copies(<138 copies/mL). A negative result must be combined with clinical observations, patient history, and epidemiological information. The expected result is Negative.  Fact Sheet for Patients:  EntrepreneurPulse.com.au  Fact Sheet for Healthcare Providers:  IncredibleEmployment.be  This test is no t yet approved or cleared by the [place] FDA and  has been authorized for detection and/or diagnosis of SARS-CoV-2 by FDA under an Emergency Use Authorization (EUA). This EUA will remain  in effect (meaning this test can be used) for the duration of the COVID-19 declaration under Section 564(b)(1) of the Act, 21 U.S.C.section 360bbb-3(b)(1), unless the authorization is terminated  or revoked sooner.       Influenza A by PCR NEGATIVE NEGATIVE Final   Influenza B by PCR NEGATIVE NEGATIVE Final    Comment: (  NOTE) The Xpert Xpress  SARS-CoV-2/FLU/RSV plus assay is intended as an aid in the diagnosis of influenza from Nasopharyngeal swab specimens and should not be used as a sole basis for treatment. Nasal washings and aspirates are unacceptable for Xpert Xpress SARS-CoV-2/FLU/RSV testing.  Fact Sheet for Patients: EntrepreneurPulse.com.au  Fact Sheet for Healthcare Providers: IncredibleEmployment.be  This test is not yet approved or cleared by the [place] FDA and has been authorized for detection and/or diagnosis of SARS-CoV-2 by FDA under an Emergency Use Authorization (EUA). This EUA will remain in effect (meaning this test can be used) for the duration of the COVID-19 declaration under Section 564(b)(1) of the Act, 21 U.S.C. section 360bbb-3(b)(1), unless the authorization is terminated or revoked.  Performed at [facility], [address] [place], [place] [address]   Respiratory (~20 pathogens) panel by PCR     Status: None   Collection Time: 07/12/2021 12:09 PM   Specimen: Nasopharyngeal Swab; Respiratory  Result Value Ref Range Status   Adenovirus NOT DETECTED NOT DETECTED Final   Coronavirus 229E NOT DETECTED NOT DETECTED Final    Comment: (NOTE) The Coronavirus on the Respiratory Panel, DOES NOT test for the novel  Coronavirus (2019 nCoV)    Coronavirus HKU1 NOT DETECTED NOT DETECTED Final   Coronavirus NL63 NOT DETECTED NOT DETECTED Final   Coronavirus OC43 NOT DETECTED NOT DETECTED Final   Metapneumovirus NOT DETECTED NOT DETECTED Final   Rhinovirus / Enterovirus NOT DETECTED NOT DETECTED Final   Influenza A NOT DETECTED NOT DETECTED Final   Influenza B NOT DETECTED NOT DETECTED Final   Parainfluenza Virus 1 NOT DETECTED NOT DETECTED Final   Parainfluenza Virus 2 NOT DETECTED NOT DETECTED Final   Parainfluenza Virus 3 NOT DETECTED NOT DETECTED Final   Parainfluenza Virus 4 NOT DETECTED NOT DETECTED Final   Respiratory Syncytial Virus NOT  DETECTED NOT DETECTED Final   Bordetella pertussis NOT DETECTED NOT DETECTED Final   Bordetella Parapertussis NOT DETECTED NOT DETECTED Final   Chlamydophila pneumoniae NOT DETECTED NOT DETECTED Final   Mycoplasma pneumoniae NOT DETECTED NOT DETECTED Final    Comment: Performed at [facility], [place]. [address] [place], [place] [address]     Radiological Exams on Admission: DG Chest Port 1 View  Result Date: 07/07/2021 CLINICAL DATA:  Questionable sepsis. EXAM: PORTABLE CHEST 1 VIEW COMPARISON:  July 13, 2018 FINDINGS: The heart size and mediastinal contours are stable. There is no focal infiltrate or pulmonary edema. Probable minimal right pleural effusion is identified. Mild linear scar is identified in the left lung base. The visualized skeletal structures are unremarkable. IMPRESSION: No focal pneumonia. Probable minimal right pleural effusion. Electronically Signed   By: Abelardo Diesel M.D.   On: 07/24/2021 11:19    EKG: Independently reviewed. Atrial tachycardia, LAFB.   Assessment/Plan  1. Neutropenia with fever; new pancytopenia  - Presents with one day of fever and chills and found to be febrile with new pancytopenia  - ANC is 585, ALC 645, and platelets 33,000 without bleeding  - COVID, influenza, and resp virus panel negative, no PNA on CXR, UA pending but no sxs, abdominal exam benign, no meningismus, no apparent cellulitis  - Blood cultures were collected in ED and he was given vancomycin, cefepime, and Flagyl  - Continue cefepime, check/trend inflammatory markers, repeat CBC, follow cultures and clinical course    2. Oral candidiasis  - Pt reports ~10 days mouth and throat pain w/ white plaque in buccal  mucosa and oral ulcers, not improved w/ clotrimazole troches  - Start fluconazole     3. COPD, ILD, OSA, chronic hypoxic respiratory failure  - Appears stable, recently completed azithromycin and has one more day of prednisone taper  - Continue Breo, Spiriva, as  needed albuterol, CPAP qHS, supplemental O2    4. Rheumatoid arthritis  - Last dose methotrexate was 10/8, would consider holding until etiology of pancytopenia identified    5. Hypertension  - SBP low 90s in ED and antihypertensives held initially    6. Elevated transaminases  - Transaminases slightly elevated on admission with normal bilirubin and benign abdominal exam  - Monitor, hold Ofev if increases to >3x ULN   7. Multifocal atrial tachycardia  - SBP low 90s in ED and metoprolol held on admission    8. Depression  - Continue Cymbalta, Zoloft, trazodone    9. Chronic pain  - Continue tramadol, Cymbalta, and gabapentin   10. Hypokalemia - Replaced in ED, repeat chem panel in am    DVT prophylaxis: SCDs  Code Status: DNR    Level of Care: Level of care: Progressive Family Communication: Wife updated at bedside  Disposition Plan:  Patient is from: ILF  Anticipated d/c is to: ILF  Anticipated d/c date is: 07/08/21 Patient currently: Pending cultures, improvement in cell counts  Consults called: None  Admission status: Inpatient     Vianne Bulls, MD [facility]  07/14/2021, 8:16 PM

## 2021-07-05 NOTE — Progress Notes (Signed)
Elink following code sepsis

## 2021-07-06 ENCOUNTER — Encounter (HOSPITAL_COMMUNITY): Payer: Self-pay | Admitting: Family Medicine

## 2021-07-06 ENCOUNTER — Inpatient Hospital Stay (HOSPITAL_COMMUNITY): Payer: Medicare PPO

## 2021-07-06 DIAGNOSIS — R5081 Fever presenting with conditions classified elsewhere: Secondary | ICD-10-CM | POA: Diagnosis not present

## 2021-07-06 DIAGNOSIS — D709 Neutropenia, unspecified: Secondary | ICD-10-CM | POA: Diagnosis not present

## 2021-07-06 LAB — CBC WITH DIFFERENTIAL/PLATELET
Band Neutrophils: 7 %
Basophils Relative: 0 %
Blasts: NONE SEEN %
Eosinophils Relative: 11 %
HCT: 31.8 % — ABNORMAL LOW (ref 39.0–52.0)
Hemoglobin: 10.5 g/dL — ABNORMAL LOW (ref 13.0–17.0)
Lymphocytes Relative: 65 %
MCH: 37.9 pg — ABNORMAL HIGH (ref 26.0–34.0)
MCHC: 33 g/dL (ref 30.0–36.0)
MCV: 114.8 fL — ABNORMAL HIGH (ref 80.0–100.0)
Metamyelocytes Relative: NONE SEEN %
Monocytes Relative: 1 %
Myelocytes: NONE SEEN %
Neutrophils Relative %: 16 %
Platelets: 47 10*3/uL — ABNORMAL LOW (ref 150–400)
Promyelocytes Relative: NONE SEEN %
RBC Morphology: NORMAL
RBC: 2.77 MIL/uL — ABNORMAL LOW (ref 4.22–5.81)
RDW: 14.3 % (ref 11.5–15.5)
WBC Morphology: NORMAL
WBC: 1.7 10*3/uL — ABNORMAL LOW (ref 4.0–10.5)
nRBC: 0 % (ref 0.0–0.2)
nRBC: NONE SEEN /100 WBC

## 2021-07-06 LAB — COMPREHENSIVE METABOLIC PANEL
ALT: 65 U/L — ABNORMAL HIGH (ref 0–44)
AST: 33 U/L (ref 15–41)
Albumin: 2.9 g/dL — ABNORMAL LOW (ref 3.5–5.0)
Alkaline Phosphatase: 38 U/L (ref 38–126)
Anion gap: 5 (ref 5–15)
BUN: 15 mg/dL (ref 8–23)
CO2: 29 mmol/L (ref 22–32)
Calcium: 8.3 mg/dL — ABNORMAL LOW (ref 8.9–10.3)
Chloride: 102 mmol/L (ref 98–111)
Creatinine, Ser: 0.7 mg/dL (ref 0.61–1.24)
GFR, Estimated: >60 mL/min (ref >60)
Glucose, Bld: 100 mg/dL — ABNORMAL HIGH (ref 70–99)
Potassium: 3.7 mmol/L (ref 3.5–5.1)
Sodium: 136 mmol/L (ref 135–145)
Total Bilirubin: 0.9 mg/dL (ref 0.3–1.2)
Total Protein: 5.4 g/dL — ABNORMAL LOW (ref 6.5–8.1)

## 2021-07-06 LAB — VITAMIN B12: Vitamin B-12: 314 pg/mL (ref 180–914)

## 2021-07-06 LAB — HIV ANTIBODY (ROUTINE TESTING W REFLEX): HIV Screen 4th Generation wRfx: NONREACTIVE

## 2021-07-06 LAB — PROCALCITONIN: Procalcitonin: <0.1 ng/mL

## 2021-07-06 LAB — FOLATE: Folate: 8.4 ng/mL (ref >5.9)

## 2021-07-06 LAB — MAGNESIUM: Magnesium: 2 mg/dL (ref 1.7–2.4)

## 2021-07-06 LAB — SEDIMENTATION RATE: Sed Rate: 1 mm/hr (ref 0–16)

## 2021-07-06 LAB — C-REACTIVE PROTEIN: CRP: 12.8 mg/dL — ABNORMAL HIGH (ref <1.0)

## 2021-07-06 MED ORDER — FLUCONAZOLE IN SODIUM CHLORIDE 200-0.9 MG/100ML-% IV SOLN
200.0000 mg | INTRAVENOUS | Status: DC
  Administered 2021-07-07 – 2021-07-11 (×6): 200 mg via INTRAVENOUS
  Filled 2021-07-06 (×7): qty 100

## 2021-07-06 MED ORDER — SODIUM CHLORIDE 0.9 % IV SOLN
500.0000 mg | INTRAVENOUS | Status: DC
  Administered 2021-07-06 – 2021-07-11 (×6): 500 mg via INTRAVENOUS
  Filled 2021-07-06 (×6): qty 500

## 2021-07-06 MED ORDER — METOPROLOL TARTRATE 5 MG/5ML IV SOLN
2.5000 mg | Freq: Four times a day (QID) | INTRAVENOUS | Status: DC | PRN
  Administered 2021-07-06 – 2021-07-10 (×5): 2.5 mg via INTRAVENOUS
  Filled 2021-07-06 (×5): qty 5

## 2021-07-06 MED ORDER — LACTATED RINGERS IV SOLN: INTRAVENOUS | Status: DC

## 2021-07-06 MED ORDER — IPRATROPIUM BROMIDE 0.02 % IN SOLN
0.5000 mg | Freq: Four times a day (QID) | RESPIRATORY_TRACT | Status: DC
  Filled 2021-07-06: qty 2.5

## 2021-07-06 NOTE — Progress Notes (Signed)
Pharmacy Antibiotic Note  [name] is a 80 y.o. male admitted on 07/27/2021 with sepsis.  Pharmacy has been consulted for cefepime and vancomycin dosing.  Patient with a history of rheumatoid arthritis, interstitial lung disease on 4 L of oxygen COPD, enlarged pulmonary artery, coronary artery disease, OSA, hyperlipidemia, history of splenectomy. Patient presenting with 2 wks of sore throat, congestion, and throat ulcers.  SCr is 0.87 which is at baseline WBC 1.5; LA 1.3; T 102 F  Plan: Cefepime 2g q8h Azithromycin 527m IV q24 Fluconazole 407mpo > 20096mV q24  Height: _0  (170.2 cm) Weight: 100.3 kg (221 lb 1.6 oz) IBW/kg (Calculated) : 66.1  Temp (24hrs), Avg:99.2 F (37.3 C), Min:98.2 F (36.8 C), Max:100.4 F (38 C)  Recent Labs  Lab 06/27/2021 1054 07/08/2021 1209 07/06/21 0408  WBC  --  1.5* 1.7*  CREATININE 0.87  --  0.70  LATICACIDVEN 1.3  --   --      Estimated Creatinine Clearance: 84.5 mL/min (by C-G formula based on SCr of 0.7 mg/dL).    Allergies  Allergen Reactions   Sulfa Antibiotics Shortness Of Breath   Sulfamethoxazole-Trimethoprim Shortness Of Breath   Sulfasalazine Shortness Of Breath   Other Other (See Comments)   Varenicline Other (See Comments)     Strange thoughts SUICIDAL   Lisinopril Cough   Antimicrobials this admission: 10/9 Vancomycin/Flagyl x1 10/9 Cefepime x 7 da >> (10/16) 10/9 Fluconazole >> 10/10 Azithromycin >>  Microbiology results: 10/9 BCx: ngtd 10/9 UCx: sent 10/9 REsp panel PCR: neg  Thank you for allowing pharmacy to be a part of this patient's care.  GreMinda Ditto/06/2021 7:57 PM

## 2021-07-06 NOTE — Evaluation (Signed)
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    Depression    Dyspnea    on exertion   GERD (gastroesophageal reflux disease)    H/O seasonal allergies    High cholesterol    History of blood transfusion    "probably w/splenectomy"   History of kidney stones    Hypertension    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[place]) 08/10/2017   OSA on CPAP    wears CPAP   Pneumonia    "I've had it several times; last time was end of Jan 2018" (05/02/2017)   Pre-diabetes    Prostate cancer ([place])    Rheumatoid arteritis ([place])    "all over" (05/02/2017)   Thrush 12/07/2017   Wears glasses    Past Surgical History:  Past Surgical History:  Procedure Laterality Date   BACK SURGERY     COLONOSCOPY W/ POLYPECTOMY     FRACTURE SURGERY     HERNIA REPAIR     HIP ARTHROPLASTY Left 04/2016   INGUINAL HERNIA REPAIR Left 1986   JOINT REPLACEMENT     LUMBAR WOUND DEBRIDEMENT N/A 04/18/2017   Procedure: REPAIR OF LUMBAR PSEUDOMENINGOCELE;  Surgeon: Newman Pies, MD;  Location: [facility];  Service: Neurosurgery;  Laterality: N/A;  REAIR OF LUMBAR PSEUDOMENINGOCELE   LUMBAR WOUND DEBRIDEMENT N/A 05/05/2017   Procedure: I&D Lumbar Wound;  Surgeon: Newman Pies, MD;  Location: [facility];  Service: Neurosurgery;  Laterality: N/A;   MAXIMUM ACCESS (MAS)POSTERIOR LUMBAR INTERBODY FUSION (PLIF) 3 LEVEL  02/17/2017   [name] 02/17/2017   OPEN SURGICAL REPAIR OF GLUTEAL TENDON Right 11/02/2017   Procedure: Right  hip gluteal tendon repair;  Surgeon: Gaynelle Arabian, MD;  Location: [facility];  Service: Orthopedics;  Laterality: Right;   POSTERIOR LAMINECTOMY / DECOMPRESSION LUMBAR SPINE  2010   PROSTATE BIOPSY     PROSTATE BIOPSY  <2013 X 3   RIGHT HEART CATH AND CORONARY ANGIOGRAPHY N/A 05/27/2020   Procedure: RIGHT HEART CATH AND CORONARY ANGIOGRAPHY;  Surgeon: Larey Dresser, MD;  Location: [facility];  Service: Cardiovascular;  Laterality: N/A;   SPLENECTOMY  1955   TONSILLECTOMY     TOTAL HIP ARTHROPLASTY Right 27/0350   UMBILICAL HERNIA REPAIR  05/2016   HPI:  Patient is a 80 y.o male with PMH: RA, interstitial lung disease, COPD, OSA, chronic hypoxic respiratory failure, HTN, multifocal atrial tachycardia, chronic back pain, and history of hardware associated vertebral osteomyelitis. He presented to ED for evaluation of fever, chills, fatigue, mouth and throat pain. He went to urgent care approximately 10 days ago when he was first noticing mouth pain and was evaluated and started on clotrimazole troches, azithromycin and prednisone taper. Mouth pain has not reportedly improved with medications but he has gotten some temporary relief with anesthetic mouthwash. On admission to ED, patient was febrile and oxygen saturations in low to mid 90's. CXR showed possible minimal right pleural effusion but no focal PNA. CBC concerning for pancytopenia.    Assessment /  Plan / Recommendation  Clinical Impression  Patient presents with an oropharyngeal swallow that is [facility] and without overt clinical s/s of dysphagia. He did appear to have to put forth more effort when swallowing thin liquids (water) and he endorsed pain in throat as cause. Testing was limited to small amount of liquids as patient continues with mouth and throat pain from sores. SLP provided education to patient regarding oral care, use of mouth moisturizer and diet consistencies and types of foods that would be easier to tolerate and those that  could potentially aggrivate his pain. SLP recommended patient order softer foods, fairly bland in taste, avoid spicy or acidic foods and liquids. SLP also recommended trying bites of gelatin to provide temporary slick surface in mouth and throat. Patient in agreement and is fully capable of following precautions/recommendations. SLP is not recommending further ST services at this time. SLP Visit Diagnosis: Dysphagia, unspecified (R13.10)    Aspiration Risk  No limitations    Diet Recommendation Regular;Thin liquid;Other (Comment) (avoid spicy foods, avoid acidic foods and liquids, focus on softer textures, bland in taste)   Liquid Administration via: Cup;Straw Medication Administration: Whole meds with liquid Supervision: Patient able to self feed    Other  Recommendations Oral Care Recommendations: Oral care BID;Patient independent with oral care    Recommendations for follow up therapy are one component of a multi-disciplinary discharge planning process, led by the attending physician.  Recommendations may be updated based on patient status, additional functional criteria and insurance authorization.  Follow up Recommendations None      Frequency and Duration   N/A         Prognosis   N/A     Swallow Study   General Date of Onset: 07/01/2021 HPI: Patient is a 80 y.o male with PMH: RA, interstitial lung disease, COPD, OSA, chronic hypoxic respiratory failure, HTN, multifocal atrial tachycardia, chronic back pain, and history of hardware associated vertebral osteomyelitis. He presented to ED for evaluation of fever, chills, fatigue, mouth and throat pain. He went to urgent care approximately 10 days ago when he was first noticing mouth pain and was evaluated and started on clotrimazole troches, azithromycin and prednisone taper. Mouth pain has not reportedly improved with medications but he has gotten some temporary relief with anesthetic mouthwash. On admission to ED, patient was  febrile and oxygen saturations in low to mid 90's. CXR showed possible minimal right pleural effusion but no focal PNA. CBC concerning for pancytopenia. Type of Study: Bedside Swallow Evaluation Previous Swallow Assessment: None found Diet Prior to this Study: Regular;Thin liquids Temperature Spikes Noted: No Respiratory Status: Nasal cannula History of Recent Intubation: No Behavior/Cognition: Alert;Cooperative;Pleasant mood Oral Cavity Assessment: Dry;Lesions Oral Care Completed by SLP: Yes Oral Cavity - Dentition: Adequate natural dentition Vision: Functional for self-feeding Self-Feeding Abilities: Able to feed self Patient Positioning: Upright in bed Baseline Vocal Quality: Other (comment) (mild hoarseness) Volitional Cough: Strong Volitional Swallow: Able to elicit    Oral/Motor/Sensory Function Overall Oral Motor/Sensory Function: Within functional limits   Ice Chips     Thin Liquid Thin Liquid: Within functional limits Presentation: Straw;Self Fed;Cup    Nectar Thick     Honey Thick     Puree Puree: Not tested   Solid     Solid: Not tested      Sonia Baller, MA, CCC-SLP Speech Therapy

## 2021-07-06 NOTE — Progress Notes (Signed)
Progress Note:    [name]    HMC:[id number] DOB: Jan 20, 1941 DOA: 07/22/2021  PCP: Javier Glazier, MD    Brief Narrative:   80 year old male with extensive past medical history including rheumatoid arthritis, interstitial lung disease/pulmonary fibrosis, COPD, obstructive sleep apnea, chronic hypoxic respiratory failure on 4 L, hypertension, multifocal atrial tachycardia, chronic back pain, history of hardware associated vertebral osteomyelitis.  She presented to the emergency department due to fevers, chills fatigue mouth and throat pain.  Stated this approximately started 10 days ago.  Noted to have some white plaque involving the buccal mucosa and some ulcers in his mouth.  He was also noted to have an increase in his chronic cough and was evaluated in an outpatient clinic and started on clotrimazole, azithromycin and prednisone taper.  This did not improve his symptomatology therefore he presented to the emergency department.     Subjective:   No acute events overnight.  Patient seen and examined with wife present at bedside.  Still stating he has trouble swallowing primarily foods and pills.  Not so much liquids.  This has been going on for approximately 2 weeks.  He is on his 4 L oxygen at baseline and saturating well.  His prostate cancer is in remission.    Assessment and Plan:   New pancytopenia/neutropenia with fever: Oncology consulted.  They are considering bone marrow biopsy.  COVID, influenza and respiratory virus panel negative.  No pneumonia on chest x-ray.  Urinalysis negative.  Blood cultures negative to date.  Abdominal exam benign.  No evidence of meningitis.  No cellulitis.  Continue cefepime.  Continue trending inflammatory markers.   Odynophagia/oral candidiasis: Consult gastroenterology.  Concern for possible esophageal candidiasis given his oral thrush.  Will discontinue his p.o. fluconazole and start IV fluconazole.  Obtain hepatitis panel,  HIV.  Pulmonary fibrosis, ILD: Continue home nintedanib  Rheumatoid arthritis: Last dose of methotrexate was 10/8.  Currently on hold.  Hypertension: Soft blood pressures.  Home antihypertensives on hold.  Multifocal atrial tachycardia: Home metoprolol currently on hold  Chronic pain: Continue tramadol, Cymbalta and gabapentin  Depression: Continue home Zoloft, Cymbalta, trazodone  Dyslipidemia: Continue Zocor  BPH: Continue Flomax  Obesity: BMI 34. No acute treatment.  COPD: Continue Breo, Spiriva, as needed albuterol, CPAP nightly  Chronic hypoxic respiratory failure: Continue baseline 4 L oxygen.   Other information:    DVT prophylaxis: SCDs Code Status: DNR Family Communication: Wife present at bedside Disposition:   Status is: Inpatient  Remains inpatient appropriate because:Ongoing diagnostic testing needed not appropriate for outpatient work up  Dispo: The patient is from: ILF              Anticipated d/c is to: ILF              Patient currently is not medically stable to d/c.   Difficult to place patient No           Consultants:   Oncology, gastroenterology    Objective:    Vitals:   07/06/21 0619 07/06/21 0716 07/06/21 1054 07/06/21 1215  BP: 113/74  106/60 126/64  Pulse: 98  (!) 118 83  Resp: 20  20 (!) 24  Temp: 99.1 F (37.3 C)  98.2 F (36.8 C) 99 F (37.2 C)  TempSrc: Oral     SpO2: 100% 99% 98% 100%  Weight:      Height:        Intake/Output Summary (Last 24 hours) at 07/06/2021 1527  Last data filed at 07/06/2021 0900 Gross per 24 hour  Intake 360 ml  Output --  Net 360 ml   Filed Weights   07/01/2021 1141 06/28/2021 2009  Weight: 100.2 kg 100.3 kg       Physical Exam:    Constitutional: NAD, calm  Eyes: PERTLA, lids and conjunctivae normal ENMT: Mucous membranes are moist. White plaque involving buccal mucosa, ulcers.   Neck: normal, supple, no masses, no thyromegaly Respiratory: Diminished bilaterally, no  wheezing, no crackles. No accessory muscle use.  Cardiovascular: S1 & S2 heard, regular rate and rhythm. No JVD.  Abdomen: No distension, no tenderness, soft. Bowel sounds active.  Musculoskeletal: no clubbing / cyanosis. No joint deformity upper and lower extremities.   Skin: no significant rashes, lesions, ulcers. Warm, dry, well-perfused. Neurologic: CN 2-12 grossly intact. Moving all extremities. Alert and oriented.  Psychiatric: Pleasant. Cooperative.     Data Reviewed:    I have personally reviewed following labs and imaging studies  CBC: Recent Labs  Lab 07/12/2021 1209 07/06/21 0408  WBC 1.5* 1.7*  NEUTROABS 0.6* CRITICAL RESULT CALLED TO, READ BACK BY AND VERIFIED WITH:  HGB 10.3* 10.5*  HCT 30.1* 31.8*  MCV 114.0* 114.8*  PLT 33* 47*    Basic Metabolic Panel: Recent Labs  Lab 07/24/2021 1054 07/06/21 0408  NA 134* 136  K 3.0* 3.7  CL 96* 102  CO2 29 29  GLUCOSE 95 100*  BUN 20 15  CREATININE 0.87 0.70  CALCIUM 8.4* 8.3*  MG  --  2.0    GFR: Estimated Creatinine Clearance: 84.5 mL/min (by C-G formula based on SCr of 0.7 mg/dL).  Liver Function Tests: Recent Labs  Lab 06/28/2021 1054 07/06/21 0408  AST 44* 33  ALT 77* 65*  ALKPHOS 44 38  BILITOT 1.0 0.9  PROT 6.3* 5.4*  ALBUMIN 3.3* 2.9*    CBG: No results for input(s): GLUCAP in the last 168 hours.   Recent Results (from the past 240 hour(s))  Culture, blood (Routine x 2)     Status: None (Preliminary result)   Collection Time: 07/01/2021 10:54 AM   Specimen: Right Antecubital; Blood  Result Value Ref Range Status   Specimen Description   Final    RIGHT ANTECUBITAL BLOOD Performed at [facility], [address] [place], [place] [address]    Special Requests   Final    Blood Culture adequate volume BOTTLES DRAWN AEROBIC AND ANAEROBIC Performed at [facility], [address] [place], [place] [address]    Culture   Final    NO GROWTH < 24 HOURS Performed at [facility], [place] [address], [place], [place] [address]    Report Status PENDING  Incomplete  Culture, blood (Routine x 2)     Status: None (Preliminary result)   Collection Time: 07/19/2021 11:20 AM   Specimen: Left Antecubital; Blood  Result Value Ref Range Status   Specimen Description   Final    LEFT ANTECUBITAL BLOOD Performed at [facility], [address] [place], [place] [address]    Special Requests   Final    Blood Culture adequate volume BOTTLES DRAWN AEROBIC AND ANAEROBIC Performed at [facility], [address] [place], [place] [address]    Culture   Final    NO GROWTH < 24 HOURS Performed at [facility], [place] [address] [place], [place] [address]    Report Status PENDING  Incomplete  Resp Panel by RT-PCR (Flu A&B, Covid) Nasopharyngeal Swab     Status: None   Collection Time: 07/22/2021 11:42 AM   Specimen: Nasopharyngeal Swab; Nasopharyngeal(NP) swabs in vial transport medium  Result Value Ref Range Status   SARS Coronavirus 2 by RT PCR NEGATIVE NEGATIVE Final    Comment: (NOTE) SARS-CoV-2 target nucleic acids are NOT DETECTED.  The SARS-CoV-2 RNA is generally detectable in upper respiratory specimens during the acute phase of infection. The lowest concentration of SARS-CoV-2 viral copies this assay can detect is 138 copies/mL. A negative result does not preclude SARS-Cov-2 infection and should not be used as the sole basis for treatment or other patient management decisions. A negative result may occur with  improper specimen collection/handling, submission of specimen other than nasopharyngeal swab, presence of viral mutation(s) within the areas targeted by this assay, and inadequate number of viral copies(<138 copies/mL). A negative result must be combined with clinical observations, patient history, and epidemiological information. The expected result is Negative.  Fact Sheet for Patients:   EntrepreneurPulse.com.au  Fact Sheet for Healthcare Providers:  IncredibleEmployment.be  This test is no t yet approved or cleared by the [place] FDA and  has been authorized for detection and/or diagnosis of SARS-CoV-2 by FDA under an Emergency Use Authorization (EUA). This EUA will remain  in effect (meaning this test can be used) for the duration of the COVID-19 declaration under Section 564(b)(1) of the Act, 21 U.S.C.section 360bbb-3(b)(1), unless the authorization is terminated  or revoked sooner.       Influenza A by PCR NEGATIVE NEGATIVE Final   Influenza B by PCR NEGATIVE NEGATIVE Final    Comment: (NOTE) The Xpert Xpress SARS-CoV-2/FLU/RSV plus assay is intended as an aid in the diagnosis of influenza from Nasopharyngeal swab specimens and should not be used as a sole basis for treatment. Nasal washings and aspirates are unacceptable for Xpert Xpress SARS-CoV-2/FLU/RSV testing.  Fact Sheet for Patients: EntrepreneurPulse.com.au  Fact Sheet for Healthcare Providers: IncredibleEmployment.be  This test is not yet approved or cleared by the [place] FDA and has been authorized for detection and/or diagnosis of SARS-CoV-2 by FDA under an Emergency Use Authorization (EUA). This EUA will remain in effect (meaning this test can be used) for the duration of the COVID-19 declaration under Section 564(b)(1) of the Act, 21 U.S.C. section 360bbb-3(b)(1), unless the authorization is terminated or revoked.  Performed at [facility], [address] [place], [place] [address]   Respiratory (~20 pathogens) panel by PCR     Status: None   Collection Time: 07/13/2021 12:09 PM   Specimen: Nasopharyngeal Swab; Respiratory  Result Value Ref Range Status   Adenovirus NOT DETECTED NOT DETECTED Final   Coronavirus 229E NOT DETECTED NOT DETECTED Final    Comment: (NOTE) The Coronavirus on the  Respiratory Panel, DOES NOT test for the novel  Coronavirus (2019 nCoV)    Coronavirus HKU1 NOT DETECTED NOT DETECTED Final   Coronavirus NL63 NOT DETECTED NOT DETECTED Final   Coronavirus OC43 NOT DETECTED NOT DETECTED Final   Metapneumovirus NOT DETECTED NOT DETECTED Final   Rhinovirus / Enterovirus NOT DETECTED NOT DETECTED Final   Influenza A NOT DETECTED NOT DETECTED Final   Influenza B NOT DETECTED NOT DETECTED Final   Parainfluenza Virus 1 NOT DETECTED NOT DETECTED Final   Parainfluenza Virus 2 NOT DETECTED NOT DETECTED Final   Parainfluenza Virus 3 NOT DETECTED NOT DETECTED Final   Parainfluenza Virus 4 NOT DETECTED NOT  DETECTED Final   Respiratory Syncytial Virus NOT DETECTED NOT DETECTED Final   Bordetella pertussis NOT DETECTED NOT DETECTED Final   Bordetella Parapertussis NOT DETECTED NOT DETECTED Final   Chlamydophila pneumoniae NOT DETECTED NOT DETECTED Final   Mycoplasma pneumoniae NOT DETECTED NOT DETECTED Final    Comment: Performed at [facility], [place] [address], [place], [place] [address]         Radiology Studies:    DG Chest Port 1 View  Result Date: 07/10/2021 CLINICAL DATA:  Questionable sepsis. EXAM: PORTABLE CHEST 1 VIEW COMPARISON:  July 13, 2018 FINDINGS: The heart size and mediastinal contours are stable. There is no focal infiltrate or pulmonary edema. Probable minimal right pleural effusion is identified. Mild linear scar is identified in the left lung base. The visualized skeletal structures are unremarkable. IMPRESSION: No focal pneumonia. Probable minimal right pleural effusion. Electronically Signed   By: Abelardo Diesel M.D.   On: 07/16/2021 11:19        Medications:    Scheduled Meds:  DULoxetine  60 mg Oral QPM   finasteride  5 mg Oral QHS   fluconazole  400 mg Oral QHS   fluticasone furoate-vilanterol  1 puff Inhalation Daily   folic acid  1 mg Oral Daily   gabapentin  600 mg Oral TID   Nintedanib  1 capsule Oral BID    sertraline  100 mg Oral Daily   simvastatin  40 mg Oral QHS   tamsulosin  0.4 mg Oral QHS   traZODone  50 mg Oral QHS   umeclidinium bromide  1 puff Inhalation Daily   Continuous Infusions:  sodium chloride Stopped (07/19/2021 1349)   ceFEPime (MAXIPIME) IV 2 g (07/06/21 1233)       LOS: 1 day    Time spent: 35-minute    Leslee Home, MD [facility]   To contact the attending provider between 7A-7P or the covering provider during after hours 7P-7A, please log into the web site [email or web address] and access using universal [place] password for that web site. If you do not have the password, please call the hospital operator.  07/06/2021, 3:27 PM

## 2021-07-06 NOTE — Consult Note (Addendum)
[facility]  [phone] [phone]    [place]  Referring MD: Dr. Imagene Sheller   Reason for Referral: Pancytopenia  HPI: Mr. Luft is a 80 year old male with a past medical history significant for rheumatoid arthritis, interstitial lung disease, COPD, OSA, chronic hypoxic respiratory failure, hypertension, multifocal atrial tachycardia, chronic back pain, and history of hardware associated vertebral osteomyelitis.  He presented to the emergency department with fever, chills, fatigue, mouth and throat pain.  He has been having mouth pain and ulcers for about 10 days prior to admission.  He also had an increased cough and was started on azithromycin and a prednisone taper.  He was given Mycelex atrocious for the oral candidiasis. In the emergency department, he was found to be febrile.  Admission lab work showed a WBC of 1.5, hemoglobin 10.3, MCV 114, platelet count 33,000.  Folate level is adequate and vitamin B12 level is pending.  He was pancultured and started on IV antibiotics.  He is also being treated for oral candidiasis.  It appears that he is also on methotrexate for his RA and last dose was given on 07/04/2021.  Methotrexate is being held.  His most recent CBC available to me through care everywhere was performed on 03/25/2021 and at that time his WBC was 8.5, hemoglobin 13.5, MCV 114.2, platelets 253,000.  The patient continues to have fevers, T-max 102.  He also reports chills.  He has been having mouth sores and sores on his lips.  He has not really noticed much improvement in the pain in his mouth.  He denies headaches and dizziness.  Denies chest pain.  He has his baseline shortness of breath.  He denies abdominal pain, nausea, vomiting.  No bleeding reported.  The patient has been maintained on methotrexate 25 mg weekly, Remicade infusion every 8 weeks, and prednisone for his methotrexate.  He states that he has not had any  recent dose changes.  The patient is married and has 2 children.  He previously smoked 1-1/2 packs of cigarettes per day x54 years but quit in 2016.  He currently drinks alcohol daily-typically beer or liquor and estimates that he drinks about 2 beers when he drinks beer but could not estimate how much liquor he drinks.  Hematology was asked see the patient make recommendations regarding his pancytopenia.  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/2017   OSA on CPAP    wears CPAP   Pneumonia    "I've had it several times; last time was end of Jan 2018" (05/02/2017)   Pre-diabetes    Prostate cancer ([place])    Rheumatoid arteritis ([place])    "all over" (05/02/2017)   Thrush 12/07/2017   Wears glasses   :  Past Surgical History:  Procedure Laterality Date   BACK SURGERY     COLONOSCOPY W/ POLYPECTOMY     FRACTURE SURGERY  HERNIA REPAIR     HIP ARTHROPLASTY Left 04/2016   INGUINAL HERNIA REPAIR Left 1986   JOINT REPLACEMENT     LUMBAR WOUND DEBRIDEMENT N/A 04/18/2017   Procedure: REPAIR OF LUMBAR PSEUDOMENINGOCELE;  Surgeon: Newman Pies, MD;  Location: [facility];  Service: Neurosurgery;  Laterality: N/A;  REAIR OF LUMBAR PSEUDOMENINGOCELE   LUMBAR WOUND DEBRIDEMENT N/A 05/05/2017   Procedure: I&D Lumbar Wound;  Surgeon: Newman Pies, MD;  Location: [facility];  Service: Neurosurgery;  Laterality: N/A;   MAXIMUM ACCESS (MAS)POSTERIOR LUMBAR INTERBODY FUSION (PLIF) 3 LEVEL  02/17/2017    [name] 02/17/2017   OPEN SURGICAL REPAIR OF GLUTEAL TENDON Right 11/02/2017   Procedure: Right hip gluteal tendon repair;  Surgeon: Gaynelle Arabian, MD;  Location: [facility];  Service: Orthopedics;  Laterality: Right;   POSTERIOR LAMINECTOMY / DECOMPRESSION LUMBAR SPINE  2010   PROSTATE BIOPSY     PROSTATE BIOPSY  <2013 X 3   RIGHT HEART CATH AND CORONARY ANGIOGRAPHY N/A 05/27/2020   Procedure: RIGHT HEART CATH AND CORONARY ANGIOGRAPHY;  Surgeon: Larey Dresser, MD;  Location: [facility];  Service: Cardiovascular;  Laterality: N/A;   SPLENECTOMY  1955   TONSILLECTOMY     TOTAL HIP ARTHROPLASTY Right [id number]/9678   UMBILICAL HERNIA REPAIR  05/2016  : CURRENT MEDS: Current Facility-Administered Medications  Medication Dose Route Frequency Provider Last Rate Last Admin   0.9 %  sodium chloride infusion   Intravenous PRN Opyd, Ilene Qua, MD   Stopped at 07/10/2021 1349   acetaminophen (TYLENOL) tablet 650 mg  650 mg Oral Q6H PRN Opyd, Ilene Qua, MD   650 mg at 07/20/2021 2107   Or   acetaminophen (TYLENOL) suppository 650 mg  650 mg Rectal Q6H PRN Opyd, Ilene Qua, MD       albuterol (PROVENTIL) (2.5 MG/3ML) 0.083% nebulizer solution 2.5 mg  2.5 mg Nebulization Q6H PRN Opyd, Ilene Qua, MD       ceFEPIme (MAXIPIME) 2 g in sodium chloride 0.9 % 100 mL IVPB  2 g Intravenous Q8H Opyd, Ilene Qua, MD 200 mL/hr at 07/06/21 0630 2 g at 07/06/21 0630   DULoxetine (CYMBALTA) DR capsule 60 mg  60 mg Oral QPM Opyd, Ilene Qua, MD   60 mg at 07/14/2021 2107   finasteride (PROSCAR) tablet 5 mg  5 mg Oral QHS Opyd, Ilene Qua, MD   5 mg at 07/25/2021 2107   fluconazole (DIFLUCAN) tablet 400 mg  400 mg Oral QHS Opyd, Ilene Qua, MD   400 mg at 07/03/2021 2105   fluticasone furoate-vilanterol (BREO ELLIPTA) 100-25 MCG/INH 1 puff  1 puff Inhalation Daily Opyd, Ilene Qua, MD   1 puff at 93/81/01 7510   folic acid (FOLVITE) tablet 1 mg  1 mg Oral Daily Opyd, Ilene Qua, MD       gabapentin (NEURONTIN) capsule 600 mg  600 mg Oral  TID Vianne Bulls, MD   600 mg at 07/04/2021 2113   guaiFENesin-dextromethorphan (ROBITUSSIN DM) 100-10 MG/5ML syrup 5 mL  5 mL Oral Q4H PRN Opyd, Ilene Qua, MD       magic mouthwash  2 mL Oral Once Opyd, Ilene Qua, MD       magic mouthwash  5 mL Oral TID PRN Opyd, Ilene Qua, MD   5 mL at 07/06/21 0500   Nintedanib (OFEV) CAPS 150 mg  1 capsule Oral BID Opyd, Ilene Qua, MD       ondansetron (ZOFRAN) tablet 4 mg  4 mg Oral  Q6H PRN Opyd, Ilene Qua, MD       Or   ondansetron (ZOFRAN) injection 4 mg  4 mg Intravenous Q6H PRN Opyd, Ilene Qua, MD       phenol (CHLORASEPTIC) mouth spray 1 spray  1 spray Mouth/Throat PRN Opyd, Ilene Qua, MD       sertraline (ZOLOFT) tablet 100 mg  100 mg Oral Daily Opyd, Ilene Qua, MD       simvastatin (ZOCOR) tablet 40 mg  40 mg Oral QHS Opyd, Ilene Qua, MD   40 mg at 07/04/2021 2106   tamsulosin (FLOMAX) capsule 0.4 mg  0.4 mg Oral QHS Opyd, Ilene Qua, MD   0.4 mg at 07/11/2021 2107   traMADol (ULTRAM) tablet 50-100 mg  50-100 mg Oral Q6H PRN Vianne Bulls, MD   100 mg at 07/17/2021 2106   traZODone (DESYREL) tablet 50 mg  50 mg Oral QHS Opyd, Ilene Qua, MD   50 mg at 07/23/2021 2106   umeclidinium bromide (INCRUSE ELLIPTA) 62.5 MCG/INH 1 puff  1 puff Inhalation Daily Opyd, Ilene Qua, MD   1 puff at 07/06/21 0715    Allergies  Allergen Reactions   Sulfa Antibiotics Shortness Of Breath   Sulfamethoxazole-Trimethoprim Shortness Of Breath   Sulfasalazine Shortness Of Breath   Other Other (See Comments)   Varenicline Other (See Comments)     Strange thoughts SUICIDAL   Lisinopril Cough  :   Family History  Problem Relation Age of Onset   Cancer Mother    Heart disease Father    Stroke Father   :  Social History   Socioeconomic History   Marital status: Married    Spouse name: Not on file   Number of children: Not on file   Years of education: Not on file   Highest education level: Not on file  Occupational History   Not on file  Tobacco Use   Smoking  status: Former    Packs/day: 1.50    Years: 54.00    Pack years: 81.00    Types: Cigarettes    Start date: 02/21/1961    Quit date: 09/27/2014    Years since quitting: 6.7   Smokeless tobacco: Never   Tobacco comments:    05/02/2017 "quit vaping 09/2016"  Vaping Use   Vaping Use: Former   Start date: 02/22/2016   Quit date: 02/21/2017   Devices: used to quit smoking then quit vaping  Substance and Sexual Activity   Alcohol use: Yes    Alcohol/week: 17.0 standard drinks    Types: 7 Cans of beer, 10 Shots of liquor per week    Comment: Daily shots, beer etc   Drug use: No   Sexual activity: Not Currently  Other Topics Concern   Not on file  Social History Narrative   Not on file   Social Determinants of Health   Financial Resource Strain: Not on file  Food Insecurity: Not on file  Transportation Needs: Not on file  Physical Activity: Not on file  Stress: Not on file  Social Connections: Not on file  Intimate Partner Violence: Not on file  :  REVIEW OF SYSTEMS:  A comprehensive 14 point review of systems was negative except as noted in the HPI.    Exam: Patient Vitals for the past 24 hrs:  BP Temp Temp src Pulse Resp SpO2 Height Weight  07/06/21 0716 -- -- -- -- -- 99 % -- --  07/06/21 0619 113/74 99.1 F (37.3 C) Oral  98 20 100 % -- --  07/06/21 0400 -- -- -- -- (!) 38 -- -- --  07/06/21 0219 139/78 98.9 F (37.2 C) -- 94 20 (!) 88 % -- --  07/09/2021 2214 126/81 99.8 F (37.7 C) -- 83 20 100 % -- --  07/25/2021 2152 -- -- -- -- (!) 22 -- -- --  07/19/2021 2009 -- -- -- -- -- -- _0  (1.702 m) 100.3 kg  07/01/2021 1808 127/62 98.7 F (37.1 C) Oral 75 20 100 % -- --  07/04/2021 1633 -- -- -- 75 20 100 % -- --  06/30/2021 1630 125/78 -- -- (!) 152 14 100 % -- --  07/25/2021 1545 105/69 -- -- 86 20 100 % -- --  07/10/2021 1349 -- 98.9 F (37.2 C) Oral -- -- -- -- --  06/30/2021 1345 111/61 -- -- 81 (!) 34 100 % -- --  07/26/2021 1315 117/72 -- -- 95 (!) 22 100 % -- --  07/16/2021 1230  (!) 120/96 -- -- 91 (!) 33 100 % -- --  07/16/2021 1200 111/62 -- -- (!) 103 (!) 24 92 % -- --  07/17/2021 1145 111/66 -- -- 82 (!) 23 98 % -- --  06/30/2021 1141 -- -- -- -- -- -- _1  (1.702 m) 100.2 kg  07/27/2021 1130 118/66 -- -- 95 (!) 31 100 % -- --  07/19/2021 1100 103/60 -- -- 95 (!) 22 99 % -- --  07/13/2021 1044 -- -- -- -- -- 98 % -- --  07/25/2021 1026 (!) 91/52 (!) 102 F (38.9 C) -- 97 (!) 22 91 % -- --    General: Awake and alert, no distress Eyes:  no scleral icterus.   ENT: Multiple mouth sores consistent with mucositis, posterior pharynx with erythema, no bleeding.   Lymphatics:  Negative cervical, supraclavicular or axillary adenopathy.   Respiratory: Diminished breath sounds. Cardiovascular:  Regular rate and rhythm, S1/S2, without murmur, rub or gallop.  1+ bilateral lower extremity edema. GI:  abdomen was soft, flat, nontender, nondistended, without organomegaly.   Skin: No petechiae, multiple ecchymoses over his bilateral arms Neuro exam was nonfocal.  Patient was alert and oriented.  Attention was good.   Language was appropriate.  Mood was normal without depression.  Speech was not pressured.  Thought content was not tangential.    LABS:  Lab Results  Component Value Date   WBC 1.7 (L) 07/06/2021   HGB 10.5 (L) 07/06/2021   HCT 31.8 (L) 07/06/2021   PLT 47 (L) 07/06/2021   GLUCOSE 100 (H) 07/06/2021   ALT 65 (H) 07/06/2021   AST 33 07/06/2021   NA 136 07/06/2021   K 3.7 07/06/2021   CL 102 07/06/2021   CREATININE 0.70 07/06/2021   BUN 15 07/06/2021   CO2 29 07/06/2021   INR 1.0 06/29/2021   HGBA1C 5.9 (H) 07/03/2018    DG Chest Port 1 View  Result Date: 07/08/2021 CLINICAL DATA:  Questionable sepsis. EXAM: PORTABLE CHEST 1 VIEW COMPARISON:  July 13, 2018 FINDINGS: The heart size and mediastinal contours are stable. There is no focal infiltrate or pulmonary edema. Probable minimal right pleural effusion is identified. Mild linear scar is identified in the  left lung base. The visualized skeletal structures are unremarkable. IMPRESSION: No focal pneumonia. Probable minimal right pleural effusion. Electronically Signed   By: Abelardo Diesel M.D.   On: 07/24/2021 11:19     ASSESSMENT AND PLAN:  1.  Pancytopenia 2.  Febrile neutropenia  3.  Mucositis/oral candidiasis 4.  Rheumatoid arthritis maintained on methotrexate-last dose 10/8 5.  COPD 6.  Interstitial lung disease 7.  OSA 8.  Chronic hypoxic respiratory failure 9.  Hypertension 10.  Transaminitis 11.  Depression 12.  Chronic pain  -CBC reviewed.  He has macrocytic anemia, neutropenia, and thrombocytopenia.  CBC from outside hospital performed in June 2022 showed a normal WBC and platelet count.  He did have macrocytic anemia at that time. -Pancytopenia may be multifactorial and related to underlying infection, methotrexate usage, and alcohol use.  An underlying bone marrow disorder cannot be ruled out at this time. -We will review peripheral blood smear.  If no improvement in his CBC, may need to proceed with a bone marrow biopsy.  I briefly discussed this procedure with the patient today.  Follow recommendations per Dr. Marin Olp. -Continue supportive care for his mucositis. -Follow cultures and continue antibiotics per hospitalist.  Thank you for this referral.  Mikey Bussing, DNP, AGPCNP-BC, AOCNP  ADDENDUM: I saw and examined Dr. Annabell Sabal this morning.  He is very nice.  His wife was with him.  Unfortunately not available to look at his blood smear.  Regardless, he is going to need to have a bone marrow biopsy done.  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  This was done from trauma.  He also has been on methotrexate for 25 years.  He is on weekly methotrexate for rheumatoid arthritis.  The pancytopenia could be multifactorial.  However, I do worry about an underlying hematologic issue.  Given the fact that he has not had his  spleen for such a long time, he is at high risk for acute leukemia.  The fact that he has been on methotrexate for quite a while could increase his risk for myelodysplasia.  Regardless, he really needs to have the bone marrow biopsy.  This will give [place] the diagnosis.  He is on broad coverage antibiotics.  Hopefully, radiology can do the bone marrow test tomorrow.  He is very nice.  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  His wife was with him.  I think she was a third grade teacher.  They are both very very nice.  It was a lot of fun talking with him.  Lattie Haw, MD  [name] 1:5-6

## 2021-07-06 NOTE — Plan of Care (Signed)
CPAP and ABX overnight. Provider notified about WBC.

## 2021-07-06 NOTE — Progress Notes (Signed)
Pt HR 100s-120s at rest,HR 150-170s during ambulation and activities. Pt felt warm and requested temp: 100.4 F oral. BP is 130/60. Dyspnea at rest. MD updated and orders noted. Oralia Rud, RN

## 2021-07-07 ENCOUNTER — Encounter (HOSPITAL_COMMUNITY): Payer: Self-pay | Admitting: Family Medicine

## 2021-07-07 DIAGNOSIS — R5081 Fever presenting with conditions classified elsewhere: Secondary | ICD-10-CM | POA: Diagnosis not present

## 2021-07-07 DIAGNOSIS — D709 Neutropenia, unspecified: Secondary | ICD-10-CM | POA: Diagnosis not present

## 2021-07-07 DIAGNOSIS — B370 Candidal stomatitis: Secondary | ICD-10-CM

## 2021-07-07 DIAGNOSIS — F32A Depression, unspecified: Secondary | ICD-10-CM

## 2021-07-07 DIAGNOSIS — G8929 Other chronic pain: Secondary | ICD-10-CM

## 2021-07-07 DIAGNOSIS — D61818 Other pancytopenia: Secondary | ICD-10-CM | POA: Diagnosis not present

## 2021-07-07 DIAGNOSIS — G4733 Obstructive sleep apnea (adult) (pediatric): Secondary | ICD-10-CM

## 2021-07-07 DIAGNOSIS — J849 Interstitial pulmonary disease, unspecified: Secondary | ICD-10-CM

## 2021-07-07 DIAGNOSIS — K1230 Oral mucositis (ulcerative), unspecified: Secondary | ICD-10-CM

## 2021-07-07 DIAGNOSIS — J449 Chronic obstructive pulmonary disease, unspecified: Secondary | ICD-10-CM

## 2021-07-07 DIAGNOSIS — R7401 Elevation of levels of liver transaminase levels: Secondary | ICD-10-CM

## 2021-07-07 DIAGNOSIS — M069 Rheumatoid arthritis, unspecified: Secondary | ICD-10-CM | POA: Diagnosis not present

## 2021-07-07 DIAGNOSIS — J9611 Chronic respiratory failure with hypoxia: Secondary | ICD-10-CM

## 2021-07-07 DIAGNOSIS — I10 Essential (primary) hypertension: Secondary | ICD-10-CM

## 2021-07-07 LAB — CBC WITH DIFFERENTIAL/PLATELET
Abs Immature Granulocytes: 0.01 10*3/uL (ref 0.00–0.07)
Basophils Absolute: 0 10*3/uL (ref 0.0–0.1)
Basophils Relative: 0 %
Eosinophils Absolute: 0.3 10*3/uL (ref 0.0–0.5)
Eosinophils Relative: 21 %
HCT: 28.9 % — ABNORMAL LOW (ref 39.0–52.0)
Hemoglobin: 9.6 g/dL — ABNORMAL LOW (ref 13.0–17.0)
Immature Granulocytes: 1 %
Lymphocytes Relative: 59 %
Lymphs Abs: 0.7 10*3/uL (ref 0.7–4.0)
MCH: 37.9 pg — ABNORMAL HIGH (ref 26.0–34.0)
MCHC: 33.2 g/dL (ref 30.0–36.0)
MCV: 114.2 fL — ABNORMAL HIGH (ref 80.0–100.0)
Monocytes Absolute: 0.1 10*3/uL (ref 0.1–1.0)
Monocytes Relative: 8 %
Neutro Abs: 0.1 10*3/uL — CL (ref 1.7–7.7)
Neutrophils Relative %: 11 %
Platelets: 92 10*3/uL — ABNORMAL LOW (ref 150–400)
RBC: 2.53 MIL/uL — ABNORMAL LOW (ref 4.22–5.81)
RDW: 13.9 % (ref 11.5–15.5)
WBC: 1.2 10*3/uL — CL (ref 4.0–10.5)
nRBC: 0 % (ref 0.0–0.2)

## 2021-07-07 LAB — COMPREHENSIVE METABOLIC PANEL
ALT: 53 U/L — ABNORMAL HIGH (ref 0–44)
AST: 23 U/L (ref 15–41)
Albumin: 2.5 g/dL — ABNORMAL LOW (ref 3.5–5.0)
Alkaline Phosphatase: 34 U/L — ABNORMAL LOW (ref 38–126)
Anion gap: 4 — ABNORMAL LOW (ref 5–15)
BUN: 11 mg/dL (ref 8–23)
CO2: 29 mmol/L (ref 22–32)
Calcium: 8.2 mg/dL — ABNORMAL LOW (ref 8.9–10.3)
Chloride: 99 mmol/L (ref 98–111)
Creatinine, Ser: 0.61 mg/dL (ref 0.61–1.24)
GFR, Estimated: >60 mL/min (ref >60)
Glucose, Bld: 106 mg/dL — ABNORMAL HIGH (ref 70–99)
Potassium: 3.5 mmol/L (ref 3.5–5.1)
Sodium: 132 mmol/L — ABNORMAL LOW (ref 135–145)
Total Bilirubin: 1.2 mg/dL (ref 0.3–1.2)
Total Protein: 4.9 g/dL — ABNORMAL LOW (ref 6.5–8.1)

## 2021-07-07 LAB — URINE CULTURE: Culture: NO GROWTH

## 2021-07-07 LAB — SEDIMENTATION RATE: Sed Rate: 32 mm/hr — ABNORMAL HIGH (ref 0–16)

## 2021-07-07 LAB — PROCALCITONIN: Procalcitonin: <0.1 ng/mL

## 2021-07-07 LAB — PATHOLOGIST SMEAR REVIEW

## 2021-07-07 LAB — C-REACTIVE PROTEIN: CRP: 16.8 mg/dL — ABNORMAL HIGH (ref <1.0)

## 2021-07-07 MED ORDER — IPRATROPIUM BROMIDE 0.02 % IN SOLN
0.5000 mg | Freq: Two times a day (BID) | RESPIRATORY_TRACT | Status: DC
  Administered 2021-07-07 – 2021-07-10 (×7): 0.5 mg via RESPIRATORY_TRACT
  Filled 2021-07-07 (×8): qty 2.5

## 2021-07-07 MED ORDER — ENOXAPARIN SODIUM 40 MG/0.4ML IJ SOSY
40.0000 mg | PREFILLED_SYRINGE | INTRAMUSCULAR | Status: DC
  Administered 2021-07-07 – 2021-07-08 (×2): 40 mg via SUBCUTANEOUS
  Filled 2021-07-07 (×2): qty 0.4

## 2021-07-07 NOTE — Progress Notes (Signed)
Progress Note:    [name]    FBX:[id number] DOB: [birth year] DOA: 07/14/2021  PCP: Javier Glazier, MD    Brief Narrative:   80 year old male with extensive past medical history including rheumatoid arthritis, interstitial lung disease/pulmonary fibrosis, COPD, obstructive sleep apnea, chronic hypoxic respiratory failure on 4 L, hypertension, multifocal atrial tachycardia, chronic back pain, history of hardware associated vertebral osteomyelitis, history of splenectomy.  She presented to the emergency department due to fevers, chills fatigue mouth and throat pain.  Stated this approximately started 10 days ago.  Noted to have some white plaque involving the buccal mucosa and some ulcers in his mouth.  He was also noted to have an increase in his chronic cough and was evaluated in an outpatient clinic and started on clotrimazole, azithromycin and prednisone taper.  This did not improve his symptomatology therefore he presented to the emergency department.     Subjective:   Yesterday evening felt more short of breath and CXR was obtained showing CAP. Started on Zithromax. Febrile overnight.  Patient seen and examined with wife present at bedside. Still stating he has trouble swallowing primarily foods and pills but better.  Not so much liquids.  This has been going on for approximately 2 weeks.  He is on 5 L oxygen currently and saturating well. Nurse noted possible afib on tele monitor.    Assessment and Plan:   New pancytopenia/neutropenic with fever: Oncology consulted. Planned bone marrow biopsy on Friday. NPO after midnight Friday. Hold Lovenox dose prior to procedure. Continue cefepime. No signs of bleeding.  Odynophagia/oral candidiasis: Discussed with GI.  Recommended fluconazole.  If not symptomatically better recommended formal consult at that point after a few days of IV fluconazole.  HIV negative.  Hepatitis panel pending.  CAP: Continue Zithromax and Cefepime  as noted above.   Pulmonary fibrosis, ILD: Continue home nintedanib  ? A. Fib?: Nurse noted A. fib on telemetry monitor.  We will obtain twelve-lead EKG.  Rheumatoid arthritis: Last dose of methotrexate was 10/8.  Currently on hold.  Hypertension: Soft blood pressures.  Home antihypertensives on hold.  Multifocal atrial tachycardia: Home metoprolol currently on hold  Chronic pain: Continue tramadol, Cymbalta and gabapentin  Depression: Continue home Zoloft, Cymbalta, trazodone  Dyslipidemia: Continue Zocor  BPH: Continue Flomax  Obesity: BMI 34. No acute treatment.  COPD: Continue Breo, Spiriva, as needed albuterol, CPAP nightly  Chronic hypoxic respiratory failure: on 4L at baseline.   Other information:    DVT prophylaxis: Lovenox Code Status: DNR Family Communication: Wife present at bedside Disposition:   Status is: Inpatient  Remains inpatient appropriate because:Ongoing diagnostic testing needed not appropriate for outpatient work up  Dispo: The patient is from: ILF              Anticipated d/c is to: ILF              Patient currently is not medically stable to d/c.   Difficult to place patient No      Consultants:   Oncology    Objective:    Vitals:   07/07/21 0907 07/07/21 1003 07/07/21 1222 07/07/21 1400  BP: 119/71  134/70 126/80  Pulse: (!) 110  100 67  Resp: 20  (!) 22 18  Temp: 98 F (36.7 C)  98.3 F (36.8 C) 98.6 F (37 C)  TempSrc: Oral  Oral Oral  SpO2: 98% 91% 100% 97%  Weight:      Height:  Intake/Output Summary (Last 24 hours) at 07/07/2021 1735 Last data filed at 07/07/2021 1620 Gross per 24 hour  Intake 1531.92 ml  Output 1300 ml  Net 231.92 ml    Filed Weights   07/25/2021 1141 06/29/2021 2009  Weight: 100.2 kg 100.3 kg       Physical Exam:    Constitutional: NAD, calm  Eyes: PERTLA, lids and conjunctivae normal ENMT: Mucous membranes are moist. White plaque involving buccal mucosa, ulcers.   Neck:  normal, supple, no masses, no thyromegaly Respiratory: Diminished bilaterally, no wheezing, no crackles. No accessory muscle use.  Cardiovascular: S1 & S2 heard, regular rate and rhythm. No JVD.  Abdomen: No distension, no tenderness, soft. Bowel sounds active.  Musculoskeletal: no clubbing / cyanosis. No joint deformity upper and lower extremities.   Skin: no significant rashes, lesions, ulcers. Warm, dry, well-perfused. Neurologic: CN 2-12 grossly intact. Moving all extremities. Alert and oriented.  Psychiatric: Pleasant. Cooperative.     Data Reviewed:    I have personally reviewed following labs and imaging studies  CBC: Recent Labs  Lab 07/20/2021 1209 07/06/21 0408 07/07/21 0334  WBC 1.5* 1.7* 1.2*  NEUTROABS 0.6* CRITICAL RESULT CALLED TO, READ BACK BY AND VERIFIED WITH: 0.1*  HGB 10.3* 10.5* 9.6*  HCT 30.1* 31.8* 28.9*  MCV 114.0* 114.8* 114.2*  PLT 33* 47* 92*     Basic Metabolic Panel: Recent Labs  Lab 07/26/2021 1054 07/06/21 0408 07/07/21 0334  NA 134* 136 132*  K 3.0* 3.7 3.5  CL 96* 102 99  CO2 _0 GLUCOSE 95 100* 106*  BUN _1 CREATININE 0.87 0.70 0.61  CALCIUM 8.4* 8.3* 8.2*  MG  --  2.0  --      GFR: Estimated Creatinine Clearance: 84.5 mL/min (by C-G formula based on SCr of 0.61 mg/dL).  Liver Function Tests: Recent Labs  Lab 06/28/2021 1054 07/06/21 0408 07/07/21 0334  AST 44* 33 23  ALT 77* 65* 53*  ALKPHOS 44 38 34*  BILITOT 1.0 0.9 1.2  PROT 6.3* 5.4* 4.9*  ALBUMIN 3.3* 2.9* 2.5*     CBG: No results for input(s): GLUCAP in the last 168 hours.   Recent Results (from the past 240 hour(s))  Culture, blood (Routine x 2)     Status: None (Preliminary result)   Collection Time: 07/18/2021 10:54 AM   Specimen: Right Antecubital; Blood  Result Value Ref Range Status   Specimen Description   Final    RIGHT ANTECUBITAL BLOOD Performed at [facility], [address] [place], [place] [address]    Special  Requests   Final    Blood Culture adequate volume BOTTLES DRAWN AEROBIC AND ANAEROBIC Performed at [facility], [address] [place], [place] [address]    Culture   Final    NO GROWTH 2 DAYS Performed at [facility], [place] [address], [place], [place] [address]    Report Status PENDING  Incomplete  Culture, blood (Routine x 2)     Status: None (Preliminary result)   Collection Time: 06/29/2021 11:20 AM   Specimen: Left Antecubital; Blood  Result Value Ref Range Status   Specimen Description   Final    LEFT ANTECUBITAL BLOOD Performed at [facility], [address] [place], [place] [address]    Special Requests   Final    Blood Culture adequate volume BOTTLES DRAWN AEROBIC AND ANAEROBIC Performed at [facility], [address] [place], [place] [address]    Culture   Final    NO GROWTH 2 DAYS Performed at [facility] [address] [place], [place] [address]    Report Status PENDING  Incomplete  Resp Panel by RT-PCR (Flu A&B, Covid) Nasopharyngeal Swab     Status: None   Collection Time: 07/19/2021 11:42 AM   Specimen: Nasopharyngeal Swab; Nasopharyngeal(NP) swabs in vial transport medium  Result Value Ref Range Status   SARS Coronavirus 2 by RT PCR NEGATIVE NEGATIVE Final    Comment: (NOTE) SARS-CoV-2 target nucleic acids are NOT DETECTED.  The SARS-CoV-2 RNA is generally detectable in upper respiratory specimens during the acute phase of infection. The lowest concentration of SARS-CoV-2 viral copies this assay can detect is 138 copies/mL. A negative result does not preclude SARS-Cov-2 infection and should not be used as the sole basis for treatment or other patient management decisions. A negative result may occur with  improper specimen collection/handling, submission of specimen other than nasopharyngeal swab, presence of viral mutation(s) within the areas targeted by this assay, and inadequate number of viral copies(<138  copies/mL). A negative result must be combined with clinical observations, patient history, and epidemiological information. The expected result is Negative.  Fact Sheet for Patients:  EntrepreneurPulse.com.au  Fact Sheet for Healthcare Providers:  IncredibleEmployment.be  This test is no t yet approved or cleared by the [place] FDA and  has been authorized for detection and/or diagnosis of SARS-CoV-2 by FDA under an Emergency Use Authorization (EUA). This EUA will remain  in effect (meaning this test can be used) for the duration of the COVID-19 declaration under Section 564(b)(1) of the Act, 21 U.S.C.section 360bbb-3(b)(1), unless the authorization is terminated  or revoked sooner.       Influenza A by PCR NEGATIVE NEGATIVE Final   Influenza B by PCR NEGATIVE NEGATIVE Final    Comment: (NOTE) The Xpert Xpress SARS-CoV-2/FLU/RSV plus assay is intended as an aid in the diagnosis of influenza from Nasopharyngeal swab specimens and should not be used as a sole basis for treatment. Nasal washings and aspirates are unacceptable for Xpert Xpress SARS-CoV-2/FLU/RSV testing.  Fact Sheet for Patients: EntrepreneurPulse.com.au  Fact Sheet for Healthcare Providers: IncredibleEmployment.be  This test is not yet approved or cleared by the [place] FDA and has been authorized for detection and/or diagnosis of SARS-CoV-2 by FDA under an Emergency Use Authorization (EUA). This EUA will remain in effect (meaning this test can be used) for the duration of the COVID-19 declaration under Section 564(b)(1) of the Act, 21 U.S.C. section 360bbb-3(b)(1), unless the authorization is terminated or revoked.  Performed at [facility], [address] [place], [place] [address]   Respiratory (~20 pathogens) panel by PCR     Status: None   Collection Time: 07/09/2021 12:09 PM   Specimen: Nasopharyngeal Swab;  Respiratory  Result Value Ref Range Status   Adenovirus NOT DETECTED NOT DETECTED Final   Coronavirus 229E NOT DETECTED NOT DETECTED Final    Comment: (NOTE) The Coronavirus on the Respiratory Panel, DOES NOT test for the novel  Coronavirus (2019 nCoV)    Coronavirus HKU1 NOT DETECTED NOT DETECTED Final   Coronavirus NL63 NOT DETECTED NOT DETECTED Final   Coronavirus OC43 NOT DETECTED NOT DETECTED Final   Metapneumovirus NOT DETECTED NOT DETECTED Final   Rhinovirus / Enterovirus NOT DETECTED NOT DETECTED Final   Influenza A NOT DETECTED NOT DETECTED Final   Influenza B NOT DETECTED  NOT DETECTED Final   Parainfluenza Virus 1 NOT DETECTED NOT DETECTED Final   Parainfluenza Virus 2 NOT DETECTED NOT DETECTED Final   Parainfluenza Virus 3 NOT DETECTED NOT DETECTED Final   Parainfluenza Virus 4 NOT DETECTED NOT DETECTED Final   Respiratory Syncytial Virus NOT DETECTED NOT DETECTED Final   Bordetella pertussis NOT DETECTED NOT DETECTED Final   Bordetella Parapertussis NOT DETECTED NOT DETECTED Final   Chlamydophila pneumoniae NOT DETECTED NOT DETECTED Final   Mycoplasma pneumoniae NOT DETECTED NOT DETECTED Final    Comment: Performed at [facility], [place] [address], [place], [place] [address]  Urine Culture     Status: None   Collection Time: 07/08/2021  7:04 PM   Specimen: In/Out Cath Urine  Result Value Ref Range Status   Specimen Description   Final    IN/OUT CATH URINE Performed at [place] [address] [place], [place] [address]    Special Requests   Final    NONE Performed at [facility], [place] [address], [place], [place] [address]    Culture   Final    NO GROWTH Performed at [facility], [place] [address], [place], [place] [address]    Report Status 07/07/2021 FINAL  Final          Radiology Studies:    DG Chest Port 1 View  Result Date: 07/06/2021 CLINICAL DATA:  Tachycardia, fever, dyspnea, history of  prostate cancer EXAM: PORTABLE CHEST 1 VIEW COMPARISON:  06/29/2021 FINDINGS: Single frontal view of the chest demonstrates stable enlargement the cardiac silhouette. Continued ectasia of the thoracic aorta. Bibasilar airspace disease, left greater than right, concerning for pneumonia. Small effusions are suspected. No pneumothorax. No acute bony abnormalities. IMPRESSION: 1. Patchy bibasilar airspace disease and likely trace effusions, concerning for pneumonia. Electronically Signed   By: Randa Ngo M.D.   On: 07/06/2021 19:11        Medications:    Scheduled Meds:  DULoxetine  60 mg Oral QPM   enoxaparin (LOVENOX) injection  40 mg Subcutaneous Q24H   finasteride  5 mg Oral QHS   fluticasone furoate-vilanterol  1 puff Inhalation Daily   folic acid  1 mg Oral Daily   gabapentin  600 mg Oral TID   ipratropium  0.5 mg Nebulization BID   Nintedanib  1 capsule Oral BID   sertraline  100 mg Oral Daily   simvastatin  40 mg Oral QHS   tamsulosin  0.4 mg Oral QHS   traZODone  50 mg Oral QHS   Continuous Infusions:  sodium chloride Stopped (07/16/2021 1349)   azithromycin 500 mg (07/06/21 2153)   ceFEPime (MAXIPIME) IV 2 g (07/07/21 1259)   fluconazole (DIFLUCAN) IV 200 mg (07/07/21 0001)       LOS: 2 days    Time spent: 35-minute    Leslee Home, MD [facility]   To contact the attending provider between 7A-7P or the covering provider during after hours 7P-7A, please log into the web site [email or web address] and access using universal [place] password for that web site. If you do not have the password, please call the hospital operator.  07/07/2021, 5:35 PM

## 2021-07-07 NOTE — H&P (Signed)
Chief Complaint: Patient was seen in consultation today for image guided bone marrow biopsy and aspiration with sedation at the request of Dr. Marin Olp  Referring Physician(s): Dr. Marin Olp  Supervising Physician: Jacqulynn Cadet  Patient Status: [facility] - In-pt  History of Present Illness: [name] is a 80 y.o. male with PMH of chronic lower back pain, anesthesia complication, COPD on 4 L O2, degenerative scoliosis, DOE, GERD, hardware associated vertebral osteomyelitis, hypercholesteremia, splenectomy, nephrolithiasis, HTN, right hip prosthetic, OSA on CPAP, PNA, prostate cancer, rheumatoid arteritis and thrush.  Patient presented to the emergency department on 07/06/2021 complaining of fever, chills, mouth sores, shortness of breath.  Patient CBC resulted macrocytic anemia, neutropenia and thrombocytopenia.  Previous CBC in June 2022 showed normal WBC and platelet count.  Due to concerns for underlying hematologic issue and no spleen since the age of 37, there is a risk for acute leukemia.  With patient's 25-year methotrexate use for rheumatoid arthritis he is also at higher risk for mild dysplasias.  Dr. Marin Olp has referred patient for bone marrow biopsy and aspiration for diagnosis.  Past Medical History:  Diagnosis Date  . Chronic lower back pain   . Complication of anesthesia 01/2017   was in ICU due to breathing complications-  . COPD (chronic obstructive pulmonary disease) ([place])   . Degenerative scoliosis in adult patient   . Depression   . Dyspnea    on exertion  . GERD (gastroesophageal reflux disease)   . H/O seasonal allergies   . High cholesterol   . 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 History of kidney stones   . Hypertension   . 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 OSA on CPAP    wears CPAP  . Pneumonia    "I've had it several times; last time was end of Jan 2018" (05/02/2017)  .  Pre-diabetes   . Prostate cancer ([place])   . Rheumatoid arteritis ([place])    "all over" (05/02/2017)  . Thrush 12/07/2017  . Wears glasses     Past Surgical History:  Procedure Laterality Date  . BACK SURGERY    . COLONOSCOPY W/ POLYPECTOMY    . FRACTURE SURGERY    . HERNIA REPAIR    . HIP ARTHROPLASTY Left 04/2016  . INGUINAL HERNIA REPAIR Left 1986  . JOINT REPLACEMENT    . LUMBAR WOUND DEBRIDEMENT N/A 04/18/2017   Procedure: REPAIR OF LUMBAR PSEUDOMENINGOCELE;  Surgeon: Newman Pies, MD;  Location: [facility];  Service: Neurosurgery;  Laterality: N/A;  REAIR OF LUMBAR PSEUDOMENINGOCELE  . LUMBAR WOUND DEBRIDEMENT N/A 05/05/2017   Procedure: I&D Lumbar Wound;  Surgeon: Newman Pies, MD;  Location: [place];  Service: Neurosurgery;  Laterality: N/A;  . MAXIMUM ACCESS (MAS)POSTERIOR LUMBAR INTERBODY FUSION (PLIF) 3 LEVEL  02/17/2017   [name] 02/17/2017  . OPEN SURGICAL REPAIR OF GLUTEAL TENDON Right 11/02/2017   Procedure: Right hip gluteal tendon repair;  Surgeon: Gaynelle Arabian, MD;  Location: [facility];  Service: Orthopedics;  Laterality: Right;  . POSTERIOR LAMINECTOMY / DECOMPRESSION LUMBAR SPINE  2010  . PROSTATE BIOPSY    . PROSTATE BIOPSY  <2013 X 3  . RIGHT HEART CATH AND CORONARY ANGIOGRAPHY N/A 05/27/2020   Procedure: RIGHT HEART CATH AND CORONARY ANGIOGRAPHY;  Surgeon: Larey Dresser, MD;  Location: [facility];  Service: Cardiovascular;  Laterality: N/A;  . SPLENECTOMY  1955  . TONSILLECTOMY    . TOTAL HIP ARTHROPLASTY Right  10/2015  . UMBILICAL HERNIA REPAIR  05/2016    Allergies: Sulfa antibiotics, Sulfamethoxazole-trimethoprim, Sulfasalazine, Other, Varenicline, and Lisinopril  Medications: Prior to Admission medications   Medication Sig Start Date End Date Taking? Authorizing Provider  albuterol (PROVENTIL HFA;VENTOLIN HFA) 108 (90 Base) MCG/ACT inhaler Inhale 2 puffs into the lungs every 6 (six) hours as needed for wheezing or shortness of breath.   Yes [provider]  BREO ELLIPTA 100-25 MCG/INH AEPB INHALE 1 PUFF INTO LUNGS DAILY Patient taking differently: Inhale 1 puff into the lungs daily. 04/16/21  Yes Mannam, Praveen, MD  DULoxetine (CYMBALTA) 60 MG capsule Take 60 mg by mouth every evening.    Yes [provider]  finasteride (PROSCAR) 5 MG tablet Take 5 mg by mouth at bedtime.    Yes [provider]  folic acid (FOLVITE) 1 MG tablet Take 1 mg by mouth daily. 06/29/21  Yes [provider]  furosemide (LASIX) 20 MG tablet Take 1 tablet (68m total) daily for next 7 days for fluid management Patient taking differently: Take 20 mg by mouth daily. 04/05/19  Yes MLauraine Rinne NP  gabapentin (NEURONTIN) 300 MG capsule Take 600 mg by mouth 3 (three) times daily.   Yes [provider]  guaifenesin (HUMIBID E) 400 MG TABS tablet Take 400 mg by mouth at bedtime.   Yes [provider]  ibuprofen (ADVIL) 200 MG tablet Take 600-800 mg by mouth daily as needed for mild pain or moderate pain.   Yes [provider]  lidocaine (XYLOCAINE) 2 % solution Use as directed 15 mLs in the mouth or throat every 6 (six) hours as needed for mouth pain. 07/01/21  Yes [provider]  losartan (COZAAR) 25 MG tablet TAKE ONE (1) TABLET BY MOUTH EACH DAY 04/30/21  Yes Allred, JJeneen Rinks MD  metFORMIN (GLUCOPHAGE) 500 MG tablet Take 500 mg by mouth daily.  04/24/19  Yes [provider]  methotrexate (RHEUMATREX) 2.5 MG tablet Take 25 mg by mouth once a week. Take 10 tablets (25 mg) Once A Weekly on Saturday. Caution:Chemotherapy. Protect from light.   Yes [provider]  metoprolol succinate (TOPROL-XL) 50 MG 24 hr tablet TAKE ONE TABLET BY MOUTH TWICE DAILY WITH OR IMMEDIATELY FOLLOWING A MEAL Patient taking differently: Take 50 mg by mouth in the morning and at bedtime. 07/15/20  Yes Allred, JJeneen Rinks MD  predniSONE (DELTASONE) 10 MG tablet Take 20-60 mg by mouth as directed. TAKE 6 TABS DAILY FOR 4  DAYS, THEN TAKE 4 TABS DAILY FOR 4 DAYS, THEN TAKE 2 TABS DAILY FOR 4 DAYS. TAKE WITH FOOD. 06/26/21  Yes [provider]  sertraline (ZOLOFT) 100 MG tablet Take 100 mg by mouth daily. 03/26/21  Yes [provider]  simvastatin (ZOCOR) 40 MG tablet Take 40 mg by mouth at bedtime.    Yes [provider]  SPIRIVA RESPIMAT 2.5 MCG/ACT AERS INHALE TWO (2) PUFFS INTO THE LUNGS ONCE DAILY Patient taking differently: Take 2 puffs by mouth daily. 02/02/21  Yes MLauraine Rinne NP  tamsulosin (FLOMAX) 0.4 MG CAPS capsule Take 0.4 mg by mouth at bedtime.    Yes [provider]  traMADol (ULTRAM) 50 MG tablet Take 50-100 mg by mouth every 6 (six) hours as needed for moderate pain or severe pain.  03/03/20  Yes [provider]  traZODone (DESYREL) 50 MG tablet Take 50 mg by mouth at bedtime.   Yes [provider]  aspirin EC 81 MG tablet Take  81 mg by mouth daily. Swallow whole.    [provider]  fluticasone (FLONASE) 50 MCG/ACT nasal spray Place 2 sprays into both nostrils daily as needed for allergies.     [provider]  OFEV 150 MG CAPS TAKE 1 CAPSULE BY MOUTH TWICE A DAY 05/04/21   Brand Males, MD     Family History  Problem Relation Age of Onset  . Cancer Mother   . Heart disease Father   . Stroke Father     Social History   Socioeconomic History  . Marital status: Married    Spouse name: Not on file  . Number of children: Not on file  . Years of education: Not on file  . Highest education level: Not on file  Occupational History  . Not on file  Tobacco Use  . Smoking status: Former    Packs/day: 1.50    Years: 54.00    Pack years: 81.00    Types: Cigarettes    Start date: 02/21/1961    Quit date: 09/27/2014    Years since quitting: 6.7  . Smokeless tobacco: Never  . Tobacco comments:    05/02/2017 "quit vaping 09/2016"  Vaping Use  . Vaping Use: Former  . Start date: 02/22/2016  . Quit date: 02/21/2017  . Devices:  used to quit smoking then quit vaping  Substance and Sexual Activity  . Alcohol use: Yes    Alcohol/week: 17.0 standard drinks    Types: 7 Cans of beer, 10 Shots of liquor per week    Comment: Daily shots, beer etc  . Drug use: No  . Sexual activity: Not Currently  Other Topics Concern  . Not on file  Social History Narrative  . Not on file   Social Determinants of Health   Financial Resource Strain: Not on file  Food Insecurity: Not on file  Transportation Needs: Not on file  Physical Activity: Not on file  Stress: Not on file  Social Connections: Not on file    Review of Systems: A 12 point ROS discussed and pertinent positives are indicated in the HPI above.  All other systems are negative.  Review of Systems  Constitutional:  Positive for appetite change, chills, fatigue and fever.  HENT:  Positive for mouth sores and trouble swallowing.   Respiratory:  Positive for shortness of breath.   Cardiovascular:  Negative for chest pain and leg swelling.  Gastrointestinal:  Negative for abdominal pain, blood in stool, diarrhea, nausea and vomiting.  Genitourinary:  Negative for hematuria.  Neurological:  Positive for weakness and headaches.   Vital Signs: BP 119/71 (BP Location: Left Arm)   Pulse (!) 110   Temp 98 F (36.7 C) (Oral)   Resp 20   Ht _0  (1.702 m)   Wt 221 lb 1.6 oz (100.3 kg)   SpO2 98%   BMI 34.63 kg/m   Physical Exam Constitutional:      Appearance: He is ill-appearing.  HENT:     Head: Normocephalic and atraumatic.     Mouth/Throat:     Mouth: Mucous membranes are moist.     Pharynx: Oropharynx is clear.  Cardiovascular:     Rate and Rhythm: Tachycardia present. Rhythm irregular.     Pulses: Normal pulses.     Heart sounds: Normal heart sounds. No murmur heard.   No gallop.  Pulmonary:     Effort: Pulmonary effort is normal. No respiratory distress.     Breath sounds: No stridor. No wheezing  or rhonchi.     Comments: Rales to  RLL Abdominal:     General: Bowel sounds are normal.  Musculoskeletal:     Right lower leg: No edema.     Left lower leg: No edema.  Skin:    General: Skin is warm and dry.  Neurological:     Mental Status: He is alert and oriented to person, place, and time.  Psychiatric:        Mood and Affect: Mood normal.        Behavior: Behavior normal.        Thought Content: Thought content normal.        Judgment: Judgment normal.    Imaging: DG Chest Port 1 View  Result Date: 07/06/2021 CLINICAL DATA:  Tachycardia, fever, dyspnea, history of prostate cancer EXAM: PORTABLE CHEST 1 VIEW COMPARISON:  07/18/2021 FINDINGS: Single frontal view of the chest demonstrates stable enlargement the cardiac silhouette. Continued ectasia of the thoracic aorta. Bibasilar airspace disease, left greater than right, concerning for pneumonia. Small effusions are suspected. No pneumothorax. No acute bony abnormalities. IMPRESSION: 1. Patchy bibasilar airspace disease and likely trace effusions, concerning for pneumonia. Electronically Signed   By: Randa Ngo M.D.   On: 07/06/2021 19:11   DG Chest Port 1 View  Result Date: 07/26/2021 CLINICAL DATA:  Questionable sepsis. EXAM: PORTABLE CHEST 1 VIEW COMPARISON:  July 13, 2018 FINDINGS: The heart size and mediastinal contours are stable. There is no focal infiltrate or pulmonary edema. Probable minimal right pleural effusion is identified. Mild linear scar is identified in the left lung base. The visualized skeletal structures are unremarkable. IMPRESSION: No focal pneumonia. Probable minimal right pleural effusion. Electronically Signed   By: Abelardo Diesel M.D.   On: 07/08/2021 11:19    Labs:  CBC: Recent Labs    07/07/2021 1209 07/06/21 0408 07/07/21 0334  WBC 1.5* 1.7* 1.2*  HGB 10.3* 10.5* 9.6*  HCT 30.1* 31.8* 28.9*  PLT 33* 47* 92*    COAGS: Recent Labs    07/04/2021 1054  INR 1.0  APTT 23*    BMP: Recent Labs    07/08/2021 1054  07/06/21 0408 07/07/21 0334  NA 134* 136 132*  K 3.0* 3.7 3.5  CL 96* 102 99  CO2 _0 GLUCOSE 95 100* 106*  BUN _1 CALCIUM 8.4* 8.3* 8.2*  CREATININE 0.87 0.70 0.61  GFRNONAA >60 >60 >60    LIVER FUNCTION TESTS: Recent Labs    06/09/21 1504 07/23/2021 1054 07/06/21 0408 07/07/21 0334  BILITOT 0.8 1.0 0.9 1.2  AST 24 44* 33 23  ALT 19 77* 65* 53*  ALKPHOS 52 44 38 34*  PROT 6.6 6.3* 5.4* 4.9*  ALBUMIN 3.9 3.3* 2.9* 2.5*    TUMOR MARKERS: No results for input(s): AFPTM, CEA, CA199, CHROMGRNA in the last 8760 hours.  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  Patient presented to the emergency department on 07/27/2021 complaining of fever, chills, mouth sores, shortness of breath.  Patient CBC resulted macrocytic anemia, neutropenia and thrombocytopenia.  Previous CBC in June 2022 showed normal WBC and platelet count.  Due to concerns for underlying hematologic issue and no spleen since the age of 32, there is a risk for acute leukemia.  With patient's 25-year methotrexate  use for rheumatoid arthritis he is also at higher risk for mild dysplasias.  Dr. Marin Olp has referred patient for bone marrow biopsy and aspiration for diagnosis.  Patient sitting up in chair with wife at bedside.  He is alert and oriented, calm and pleasant. He is in no distress Patient advised night prior to bone marrow biopsy he needs to be NPO.  Patient also advised the procedure will be delayed till 07/10/2021 due to scheduling. Patient denies the use of blood thinners.  Hospitalist at bedside states that he has entered n.p.o. order midnight prior to procedure.  He states patient is not on blood thinners only on  SCDs.  Risks and benefits of bone marrow biopsy and aspiration with sedation was discussed with the patient and/or patient's family including, but not limited to bleeding, infection, damage to adjacent structures or low yield requiring additional tests.  All of the questions were answered and there is agreement to proceed.  Consent signed and in chart.   Thank you for this interesting consult.  I greatly enjoyed [place] and look forward to participating in their care.  A copy of this report was sent to the requesting provider on this date.  Electronically Signed: Tyson Alias, NP 07/07/2021, 9:48 AM   I spent a total of 30 minutes in face to face in clinical consultation, greater than 50% of which was counseling/coordinating care for image guided bone marrow biopsy and aspiration with sedation.

## 2021-07-08 DIAGNOSIS — J42 Unspecified chronic bronchitis: Secondary | ICD-10-CM | POA: Diagnosis not present

## 2021-07-08 DIAGNOSIS — D709 Neutropenia, unspecified: Secondary | ICD-10-CM | POA: Diagnosis not present

## 2021-07-08 DIAGNOSIS — B370 Candidal stomatitis: Secondary | ICD-10-CM | POA: Diagnosis not present

## 2021-07-08 DIAGNOSIS — D61818 Other pancytopenia: Secondary | ICD-10-CM | POA: Diagnosis not present

## 2021-07-08 LAB — CBC WITH DIFFERENTIAL/PLATELET
Abs Immature Granulocytes: 0.01 10*3/uL (ref 0.00–0.07)
Basophils Absolute: 0 10*3/uL (ref 0.0–0.1)
Basophils Relative: 1 %
Eosinophils Absolute: 0.3 10*3/uL (ref 0.0–0.5)
Eosinophils Relative: 16 %
HCT: 28.6 % — ABNORMAL LOW (ref 39.0–52.0)
Hemoglobin: 9.4 g/dL — ABNORMAL LOW (ref 13.0–17.0)
Immature Granulocytes: 1 %
Lymphocytes Relative: 54 %
Lymphs Abs: 0.8 10*3/uL (ref 0.7–4.0)
MCH: 38.4 pg — ABNORMAL HIGH (ref 26.0–34.0)
MCHC: 32.9 g/dL (ref 30.0–36.0)
MCV: 116.7 fL — ABNORMAL HIGH (ref 80.0–100.0)
Monocytes Absolute: 0.2 10*3/uL (ref 0.1–1.0)
Monocytes Relative: 12 %
Neutro Abs: 0.2 10*3/uL — CL (ref 1.7–7.7)
Neutrophils Relative %: 16 %
Platelets: 170 10*3/uL (ref 150–400)
RBC: 2.45 MIL/uL — ABNORMAL LOW (ref 4.22–5.81)
RDW: 13.8 % (ref 11.5–15.5)
WBC: 1.7 10*3/uL — ABNORMAL LOW (ref 4.0–10.5)
nRBC: 0 % (ref 0.0–0.2)

## 2021-07-08 LAB — COMPREHENSIVE METABOLIC PANEL
ALT: 49 U/L — ABNORMAL HIGH (ref 0–44)
AST: 26 U/L (ref 15–41)
Albumin: 2.4 g/dL — ABNORMAL LOW (ref 3.5–5.0)
Alkaline Phosphatase: 36 U/L — ABNORMAL LOW (ref 38–126)
Anion gap: 6 (ref 5–15)
BUN: 10 mg/dL (ref 8–23)
CO2: 30 mmol/L (ref 22–32)
Calcium: 8.4 mg/dL — ABNORMAL LOW (ref 8.9–10.3)
Chloride: 98 mmol/L (ref 98–111)
Creatinine, Ser: 0.62 mg/dL (ref 0.61–1.24)
GFR, Estimated: >60 mL/min (ref >60)
Glucose, Bld: 96 mg/dL (ref 70–99)
Potassium: 3.3 mmol/L — ABNORMAL LOW (ref 3.5–5.1)
Sodium: 134 mmol/L — ABNORMAL LOW (ref 135–145)
Total Bilirubin: 1.1 mg/dL (ref 0.3–1.2)
Total Protein: 5.1 g/dL — ABNORMAL LOW (ref 6.5–8.1)

## 2021-07-08 MED ORDER — METOPROLOL TARTRATE 25 MG PO TABS
25.0000 mg | ORAL_TABLET | Freq: Two times a day (BID) | ORAL | Status: DC
  Administered 2021-07-08 – 2021-07-10 (×5): 25 mg via ORAL
  Filled 2021-07-08 (×5): qty 1

## 2021-07-08 MED ORDER — SODIUM CHLORIDE 0.9 % IV SOLN
1.0000 g | INTRAVENOUS | Status: AC
  Administered 2021-07-08 – 2021-07-12 (×5): 1 g via INTRAVENOUS
  Filled 2021-07-08 (×6): qty 10

## 2021-07-08 MED ORDER — POTASSIUM CHLORIDE CRYS ER 20 MEQ PO TBCR
40.0000 meq | EXTENDED_RELEASE_TABLET | Freq: Once | ORAL | Status: AC
  Administered 2021-07-08: 40 meq via ORAL
  Filled 2021-07-08: qty 2

## 2021-07-08 NOTE — Plan of Care (Signed)
  Problem: Clinical Measurements: Goal: Ability to maintain clinical measurements within normal limits will improve Outcome: Progressing Goal: Respiratory complications will improve Outcome: Progressing   Problem: Activity: Goal: Risk for activity intolerance will decrease Outcome: Progressing

## 2021-07-08 NOTE — Plan of Care (Signed)

## 2021-07-08 NOTE — Progress Notes (Addendum)
Progress Note:    [name]    YHO:[id number] DOB: February 26, 1941 DOA: 07/14/2021  PCP: Javier Glazier, MD    Brief Narrative:   80 year old male with extensive past medical history including rheumatoid arthritis, interstitial lung disease/pulmonary fibrosis, COPD, obstructive sleep apnea, chronic hypoxic respiratory failure on 4 L, hypertension, multifocal atrial tachycardia, chronic back pain, history of hardware associated vertebral osteomyelitis, history of splenectomy.  Presented to the emergency department with fever chills fatigue.  Was noted to be pancytopenic.  Found to have oral candidiasis.  Hospitalize for further management.      Subjective:   Denies any shortness of breath or chest pain this morning.  No nausea or vomiting.  Sores in the mouth feel better.  Still has difficulty swallowing food and medications.     Assessment and Plan:   Pancytopenia/neutropenic with fever: Patient was being seen by medical oncology/hematology.  Plan is for bone marrow biopsy.  The earliest this can be done is on Friday.  Patient was noted to be febrile and was started on cefepime.  Cultures have all been negative.  Apart from the community-acquired pneumonia no other source of infection has been found.  Can narrow down to ceftriaxone. WBC remains low.  Hemoglobin stable between 9 and 10.  Platelet counts also has been stable but noted to be much improved today at 170,000.  Odynophagia/oral candidiasis Continue Diflucan.  HIV negative.  Hepatitis panel is negative except for hepatitis C antibody which is still pending.  Case was discussed with gastroenterology who recommended continuing current management.  Patient does report improvement in symptoms.  Community-acquired pneumonia Remains on azithromycin.  We will change cefepime to ceftriaxone.    History of pulmonary fibrosis, ILD Continue home nintedanib  Irregular rhythm on telemetry/history of multifocal atrial  tachycardia EKG shows sinus rhythm.  No A. fib identified.  Patient does have a history of MAT.  Continue to monitor.  Metoprolol has been on hold.  Could be resumed.  Rheumatoid arthritis Last dose of methotrexate was 10/8.  Currently on hold.  Essential hypertension Blood pressure is stable.  Continue to monitor.  Home antihypertensives are currently on hold.  Soft blood pressures.  Home antihypertensives on hold.  Chronic pain Continue tramadol, Cymbalta and gabapentin  Depression Continue home Zoloft, Cymbalta, trazodone  Dyslipidemia Continue Zocor  BPH Continue Flomax  COPD Stable.  Continue Breo, Spiriva, as needed albuterol, CPAP nightly  Chronic hypoxic respiratory failure Continue oxygen.  Uses 4L at baseline.  Obesity Estimated body mass index is 34.63 kg/m as calculated from the following:   Height as of this encounter: _0  (1.702 m).   Weight as of this encounter: 100.3 kg.    Other information:    DVT prophylaxis: Lovenox Code Status: DNR Family Communication: Wife present at bedside Disposition: Hopefully return home when improved.  PT and OT evaluation.  Status is: Inpatient  Remains inpatient appropriate because:Ongoing diagnostic testing needed not appropriate for outpatient work up  Dispo: The patient is from: ILF              Anticipated d/c is to: ILF              Patient currently is not medically stable to d/c.   Difficult to place patient No      Consultants:   Oncology    Objective:    Vitals:   07/07/21 2001 07/07/21 2030 07/08/21 0520 07/08/21 0842  BP:  133/67 114/70  Pulse: (!) 101 100 92   Resp: (!) 24  18   Temp:  98.7 F (37.1 C) 99.1 F (37.3 C)   TempSrc:  Axillary Oral   SpO2: 97% 99% 98% 99%  Weight:      Height:        Intake/Output Summary (Last 24 hours) at 07/08/2021 1228 Last data filed at 07/08/2021 0100 Gross per 24 hour  Intake 610 ml  Output 800 ml  Net -190 ml    Filed Weights    07/12/2021 1141 07/23/2021 2009  Weight: 100.2 kg 100.3 kg       Physical Exam:    General appearance: Awake alert.  In no distress Denuded areas noted in the oral mucosa. Resp: Normal effort at rest.  Coarse breath sounds bilaterally.  Few crackles.  No wheezing or rhonchi. Cardio: S1-S2 is normal regular.  No S3-S4.  No rubs murmurs or bruit GI: Abdomen is soft.  Nontender nondistended.  Bowel sounds are present normal.  No masses organomegaly Extremities: No edema.  Full range of motion of lower extremities. Neurologic: Alert and oriented x3.  No focal neurological deficits.    Data Reviewed:    I have personally reviewed following labs and imaging studies  CBC: Recent Labs  Lab 07/10/2021 1209 07/06/21 0408 07/07/21 0334 07/08/21 0344  WBC 1.5* 1.7* 1.2* 1.7*  NEUTROABS 0.6* CRITICAL RESULT CALLED TO, READ BACK BY AND VERIFIED WITH: 0.1* 0.2*  HGB 10.3* 10.5* 9.6* 9.4*  HCT 30.1* 31.8* 28.9* 28.6*  MCV 114.0* 114.8* 114.2* 116.7*  PLT 33* 47* 92* 170     Basic Metabolic Panel: Recent Labs  Lab 07/06/2021 1054 07/06/21 0408 07/07/21 0334 07/08/21 0344  NA 134* 136 132* 134*  K 3.0* 3.7 3.5 3.3*  CL 96* 102 99 98  CO2 _0 GLUCOSE 95 100* 106* 96  BUN _1 CREATININE 0.87 0.70 0.61 0.62  CALCIUM 8.4* 8.3* 8.2* 8.4*  MG  --  2.0  --   --      GFR: Estimated Creatinine Clearance: 84.5 mL/min (by C-G formula based on SCr of 0.62 mg/dL).  Liver Function Tests: Recent Labs  Lab 07/06/2021 1054 07/06/21 0408 07/07/21 0334 07/08/21 0344  AST 44* 33 23 26  ALT 77* 65* 53* 49*  ALKPHOS 44 38 34* 36*  BILITOT 1.0 0.9 1.2 1.1  PROT 6.3* 5.4* 4.9* 5.1*  ALBUMIN 3.3* 2.9* 2.5* 2.4*     CBG: No results for input(s): GLUCAP in the last 168 hours.   Recent Results (from the past 240 hour(s))  Culture, blood (Routine x 2)     Status: None (Preliminary result)   Collection Time: 07/24/2021 10:54 AM   Specimen: Right Antecubital; Blood  Result  Value Ref Range Status   Specimen Description   Final    RIGHT ANTECUBITAL BLOOD Performed at [facility], [address] [place], [place] [address]    Special Requests   Final    Blood Culture adequate volume BOTTLES DRAWN AEROBIC AND ANAEROBIC Performed at [facility], [address], [place], [place] [address]    Culture   Final    NO GROWTH 3 DAYS Performed at [facility], [place] [address], [place], [place] [address]    Report Status PENDING  Incomplete  Culture, blood (Routine x 2)     Status: None (Preliminary result)   Collection Time: 07/08/2021 11:20 AM   Specimen:  Left Antecubital; Blood  Result Value Ref Range Status   Specimen Description   Final    LEFT ANTECUBITAL BLOOD Performed at [facility], [address] [place], [place] [address]    Special Requests   Final    Blood Culture adequate volume BOTTLES DRAWN AEROBIC AND ANAEROBIC Performed at [facility], [address] [place], [place] [address]    Culture   Final    NO GROWTH 3 DAYS Performed at [facility], [place] [address], [place], [place] [address]    Report Status PENDING  Incomplete  Resp Panel by RT-PCR (Flu A&B, Covid) Nasopharyngeal Swab     Status: None   Collection Time: 07/02/2021 11:42 AM   Specimen: Nasopharyngeal Swab; Nasopharyngeal(NP) swabs in vial transport medium  Result Value Ref Range Status   SARS Coronavirus 2 by RT PCR NEGATIVE NEGATIVE Final    Comment: (NOTE) SARS-CoV-2 target nucleic acids are NOT DETECTED.  The SARS-CoV-2 RNA is generally detectable in upper respiratory specimens during the acute phase of infection. The lowest concentration of SARS-CoV-2 viral copies this assay can detect is 138 copies/mL. A negative result does not preclude SARS-Cov-2 infection and should not be used as the sole basis for treatment or other patient management decisions. A negative result may occur with  improper specimen  collection/handling, submission of specimen other than nasopharyngeal swab, presence of viral mutation(s) within the areas targeted by this assay, and inadequate number of viral copies(<138 copies/mL). A negative result must be combined with clinical observations, patient history, and epidemiological information. The expected result is Negative.  Fact Sheet for Patients:  EntrepreneurPulse.com.au  Fact Sheet for Healthcare Providers:  IncredibleEmployment.be  This test is no t yet approved or cleared by the [place] FDA and  has been authorized for detection and/or diagnosis of SARS-CoV-2 by FDA under an Emergency Use Authorization (EUA). This EUA will remain  in effect (meaning this test can be used) for the duration of the COVID-19 declaration under Section 564(b)(1) of the Act, 21 U.S.C.section 360bbb-3(b)(1), unless the authorization is terminated  or revoked sooner.       Influenza A by PCR NEGATIVE NEGATIVE Final   Influenza B by PCR NEGATIVE NEGATIVE Final    Comment: (NOTE) The Xpert Xpress SARS-CoV-2/FLU/RSV plus assay is intended as an aid in the diagnosis of influenza from Nasopharyngeal swab specimens and should not be used as a sole basis for treatment. Nasal washings and aspirates are unacceptable for Xpert Xpress SARS-CoV-2/FLU/RSV testing.  Fact Sheet for Patients: EntrepreneurPulse.com.au  Fact Sheet for Healthcare Providers: IncredibleEmployment.be  This test is not yet approved or cleared by the [place] FDA and has been authorized for detection and/or diagnosis of SARS-CoV-2 by FDA under an Emergency Use Authorization (EUA). This EUA will remain in effect (meaning this test can be used) for the duration of the COVID-19 declaration under Section 564(b)(1) of the Act, 21 U.S.C. section 360bbb-3(b)(1), unless the authorization is terminated or revoked.  Performed at [facility], [address] [place], [place] [address]   Respiratory (~20 pathogens) panel by PCR     Status: None   Collection Time: 06/27/2021 12:09 PM   Specimen: Nasopharyngeal Swab; Respiratory  Result Value Ref Range Status   Adenovirus NOT DETECTED NOT DETECTED Final   Coronavirus 229E NOT DETECTED NOT DETECTED Final    Comment: (NOTE) The Coronavirus on the Respiratory Panel, DOES NOT test for the novel  Coronavirus (  2019 nCoV)    Coronavirus HKU1 NOT DETECTED NOT DETECTED Final   Coronavirus NL63 NOT DETECTED NOT DETECTED Final   Coronavirus OC43 NOT DETECTED NOT DETECTED Final   Metapneumovirus NOT DETECTED NOT DETECTED Final   Rhinovirus / Enterovirus NOT DETECTED NOT DETECTED Final   Influenza A NOT DETECTED NOT DETECTED Final   Influenza B NOT DETECTED NOT DETECTED Final   Parainfluenza Virus 1 NOT DETECTED NOT DETECTED Final   Parainfluenza Virus 2 NOT DETECTED NOT DETECTED Final   Parainfluenza Virus 3 NOT DETECTED NOT DETECTED Final   Parainfluenza Virus 4 NOT DETECTED NOT DETECTED Final   Respiratory Syncytial Virus NOT DETECTED NOT DETECTED Final   Bordetella pertussis NOT DETECTED NOT DETECTED Final   Bordetella Parapertussis NOT DETECTED NOT DETECTED Final   Chlamydophila pneumoniae NOT DETECTED NOT DETECTED Final   Mycoplasma pneumoniae NOT DETECTED NOT DETECTED Final    Comment: Performed at [facility], [facility] [place] [address] [place], [place] [address]  Urine Culture     Status: None   Collection Time: 07/26/2021  7:04 PM   Specimen: In/Out Cath Urine  Result Value Ref Range Status   Specimen Description   Final    IN/OUT CATH URINE Performed at [place] [address] [place], [place] [address]    Special Requests   Final    NONE Performed at [facility], [place] [address], [place], [place] [address]    Culture   Final    NO GROWTH Performed at [facility], [place] [address], [place], [place]  [address]    Report Status 07/07/2021 FINAL  Final          Radiology Studies:    DG Chest Port 1 View  Result Date: 07/06/2021 CLINICAL DATA:  Tachycardia, fever, dyspnea, history of prostate cancer EXAM: PORTABLE CHEST 1 VIEW COMPARISON:  07/20/2021 FINDINGS: Single frontal view of the chest demonstrates stable enlargement the cardiac silhouette. Continued ectasia of the thoracic aorta. Bibasilar airspace disease, left greater than right, concerning for pneumonia. Small effusions are suspected. No pneumothorax. No acute bony abnormalities. IMPRESSION: 1. Patchy bibasilar airspace disease and likely trace effusions, concerning for pneumonia. Electronically Signed   By: Randa Ngo M.D.   On: 07/06/2021 19:11        Medications:    Scheduled Meds:  DULoxetine  60 mg Oral QPM   enoxaparin (LOVENOX) injection  40 mg Subcutaneous Q24H   finasteride  5 mg Oral QHS   fluticasone furoate-vilanterol  1 puff Inhalation Daily   folic acid  1 mg Oral Daily   gabapentin  600 mg Oral TID   ipratropium  0.5 mg Nebulization BID   Nintedanib  1 capsule Oral BID   sertraline  100 mg Oral Daily   simvastatin  40 mg Oral QHS   tamsulosin  0.4 mg Oral QHS   traZODone  50 mg Oral QHS   Continuous Infusions:  sodium chloride 10 mL/hr (07/07/21 1900)   azithromycin Stopped (07/07/21 2230)   ceFEPime (MAXIPIME) IV 2 g (07/08/21 1145)   fluconazole (DIFLUCAN) IV Stopped (07/07/21 2142)       LOS: 3 days    Bonnielee Haff, MD [facility]  07/08/2021, 12:28 PM

## 2021-07-08 NOTE — Care Management Important Message (Signed)
Important Message  Patient Details IM Letter placed in Patient's room. Name: [name] MRN: [id number] Date of Birth: 1940/11/11   Medicare Important Message Given:  Yes     Kerin Salen 07/08/2021, 11:26 AM

## 2021-07-08 NOTE — Progress Notes (Signed)
PT Cancellation Note  Patient Details Name: [name] MRN: [id number] DOB: December 01, 1940   Cancelled Treatment:    Reason Eval/Treat Not Completed: Medical issues which prohibited therapy (Pt wiht HR ranging from 110's-160 bpm in irregular rhythm. Will follow up at later date/time when more controlled and as schedule allows.)   Verner Mould, DPT Acute Rehabilitation Services Office [phone] Pager [phone]

## 2021-07-09 DIAGNOSIS — J42 Unspecified chronic bronchitis: Secondary | ICD-10-CM | POA: Diagnosis not present

## 2021-07-09 DIAGNOSIS — D709 Neutropenia, unspecified: Secondary | ICD-10-CM | POA: Diagnosis not present

## 2021-07-09 DIAGNOSIS — B370 Candidal stomatitis: Secondary | ICD-10-CM | POA: Diagnosis not present

## 2021-07-09 DIAGNOSIS — D61818 Other pancytopenia: Secondary | ICD-10-CM | POA: Diagnosis not present

## 2021-07-09 LAB — BASIC METABOLIC PANEL
Anion gap: 7 (ref 5–15)
BUN: 12 mg/dL (ref 8–23)
CO2: 28 mmol/L (ref 22–32)
Calcium: 8.6 mg/dL — ABNORMAL LOW (ref 8.9–10.3)
Chloride: 98 mmol/L (ref 98–111)
Creatinine, Ser: 0.61 mg/dL (ref 0.61–1.24)
GFR, Estimated: >60 mL/min (ref >60)
Glucose, Bld: 126 mg/dL — ABNORMAL HIGH (ref 70–99)
Potassium: 4 mmol/L (ref 3.5–5.1)
Sodium: 133 mmol/L — ABNORMAL LOW (ref 135–145)

## 2021-07-09 LAB — HEPATITIS PANEL, ACUTE
HCV Ab: NONREACTIVE
Hep A IgM: NONREACTIVE
Hep B C IgM: NONREACTIVE
Hepatitis B Surface Ag: NONREACTIVE

## 2021-07-09 LAB — MRSA NEXT GEN BY PCR, NASAL: MRSA by PCR Next Gen: NOT DETECTED

## 2021-07-09 LAB — MAGNESIUM: Magnesium: 2 mg/dL (ref 1.7–2.4)

## 2021-07-09 MED ORDER — ALBUTEROL SULFATE (2.5 MG/3ML) 0.083% IN NEBU
2.5000 mg | INHALATION_SOLUTION | Freq: Four times a day (QID) | RESPIRATORY_TRACT | Status: DC | PRN
  Administered 2021-07-09: 2.5 mg via RESPIRATORY_TRACT
  Filled 2021-07-09: qty 3

## 2021-07-09 MED ORDER — ALBUTEROL SULFATE (2.5 MG/3ML) 0.083% IN NEBU
INHALATION_SOLUTION | RESPIRATORY_TRACT | Status: AC
  Administered 2021-07-09: 2.5 mg via RESPIRATORY_TRACT
  Filled 2021-07-09: qty 3

## 2021-07-09 MED ORDER — ALBUTEROL SULFATE (2.5 MG/3ML) 0.083% IN NEBU
2.5000 mg | INHALATION_SOLUTION | Freq: Two times a day (BID) | RESPIRATORY_TRACT | Status: DC
  Administered 2021-07-10 (×2): 2.5 mg via RESPIRATORY_TRACT
  Filled 2021-07-09 (×3): qty 3

## 2021-07-09 NOTE — Plan of Care (Signed)

## 2021-07-09 NOTE — Evaluation (Signed)
Physical Therapy Evaluation Patient Details Name: [name] MRN: [id number] DOB: 06-15-1941 Today's Date: 07/09/2021  History of Present Illness  Pt is a 80 year old man admitted on 10/09 with fever, chills, fatigue and mouth/throat pain. +thrush, CAP, pancytopenia, neutropenia. Pt with extensive medical hx including RA, COPD, interstital lung disease/ pulmonary fibrosis on 4-5L 02 at home, chronic back pain s/p 5 back surgeries, OSA, HTN, atrial tachycardia and depression.  Clinical Impression  Pt admitted with above diagnosis. Pt currently with functional limitations due to the deficits listed below (see PT Problem List). Pt will benefit from skilled PT to increase their independence and safety with mobility to allow discharge to the venue listed below.  Pt very pleasant and happy to mobilize.  Pt ambulated good distance in hallway and denies symptoms.  Pt lives in [place] with his wife and typically uses rollator for mobility.  Pt also usually on 4L at rest and increases to 5L O2 for activity.        Recommendations for follow up therapy are one component of a multi-disciplinary discharge planning process, led by the attending physician.  Recommendations may be updated based on patient status, additional functional criteria and insurance authorization.  Follow Up Recommendations Home health PT    Equipment Recommendations  None recommended by PT    Recommendations for Other Services       Precautions / Restrictions Precautions Precautions: Fall Precaution Comments: 4L at rest , 5L with activity at baseline      Mobility  Bed Mobility Overal bed mobility: Needs Assistance Bed Mobility: Sit to Supine       Sit to supine: Supervision   General bed mobility comments: discussed log roll technique (hx of back surgeries) however pt did not return to bed this way    Transfers Overall transfer level: Needs assistance Equipment used: Rolling walker (2 wheeled) Transfers: Sit  to/from Stand Sit to Stand: Min guard         General transfer comment: min/guard for safety  Ambulation/Gait Ambulation/Gait assistance: Min guard Gait Distance (Feet): 160 Feet Assistive device: Rolling walker (2 wheeled) Gait Pattern/deviations: Step-through pattern;Decreased stride length     General Gait Details: pt required a couple standing rest breaks, ambulated on 6L (due to tank increments, pt usually on 5L with activity) and SPO2 95%, HR 109 bpm  Stairs            Wheelchair Mobility    Modified Rankin (Stroke Patients Only)       Balance Overall balance assessment: Needs assistance   Sitting balance-Leahy Scale: Fair     Standing balance support: No upper extremity supported Standing balance-Leahy Scale: Fair Standing balance comment: static fair                             Pertinent Vitals/Pain Pain Assessment: Faces Faces Pain Scale: No hurt Pain Intervention(s): Repositioned;Monitored during session    [place] expects to be discharged to:: Private residence Living Arrangements: Spouse/significant other Available Help at Discharge: Family;Available 24 hours/day Type of Home: Independent living facility [facility]) Home Access: Level entry     Home Layout: One level Home Equipment: Other (comment);Cane - single point;Wheelchair - power;Shower seat;Hand held Tourist information centre manager - 4 wheels;Adaptive equipment (02, adjustable bed)      Prior Function Level of Independence: Needs assistance   Gait / Transfers Assistance Needed: walks with rollator  ADL's / Homemaking Assistance Needed: assisted for  drying off after showering and IADL.  Comments: uses 4L 02 at rest, 5L with exertion     Hand Dominance   Dominant Hand: Right    Extremity/Trunk Assessment   Upper Extremity Assessment Upper Extremity Assessment: Overall WFL for tasks assessed    Lower Extremity Assessment Lower Extremity Assessment:  Generalized weakness    Cervical / Trunk Assessment Cervical / Trunk Assessment: Other exceptions Cervical / Trunk Exceptions: hx of chronic back pain and surgeries  Communication   Communication: No difficulties  Cognition Arousal/Alertness: Awake/alert Behavior During Therapy: WFL for tasks assessed/performed Overall Cognitive Status: Within Functional Limits for tasks assessed                                        General Comments      Exercises     Assessment/Plan    PT Assessment Patient needs continued PT services  PT Problem List Decreased mobility;Decreased strength;Decreased activity tolerance;Decreased knowledge of use of DME;Decreased balance;Cardiopulmonary status limiting activity       PT Treatment Interventions Gait training;DME instruction;Therapeutic exercise;Balance training;Functional mobility training;Therapeutic activities;Patient/family education    PT Goals (Current goals can be found in the Care Plan section)  Acute Rehab PT Goals Patient Stated Goal: return home PT Goal Formulation: With patient Time For Goal Achievement: 07/16/21 Potential to Achieve Goals: Good    Frequency Min 3X/week   Barriers to discharge        Co-evaluation PT/OT/SLP Co-Evaluation/Treatment: Yes Reason for Co-Treatment: To address functional/ADL transfers;For patient/therapist safety PT goals addressed during session: Mobility/safety with mobility OT goals addressed during session: ADL's and self-care       AM-PAC PT "6 Clicks" Mobility  Outcome Measure Help needed turning from your back to your side while in a flat bed without using bedrails?: A Little Help needed moving from lying on your back to sitting on the side of a flat bed without using bedrails?: A Little Help needed moving to and from a bed to a chair (including a wheelchair)?: A Little Help needed standing up from a chair using your arms (e.g., wheelchair or bedside chair)?: A  Little Help needed to walk in hospital room?: A Little Help needed climbing 3-5 steps with a railing? : A Little 6 Click Score: 18    End of Session Equipment Utilized During Treatment: Gait belt;Oxygen Activity Tolerance: Patient tolerated treatment well Patient left: in chair;with call bell/phone within reach;with chair alarm set   PT Visit Diagnosis: Difficulty in walking, not elsewhere classified (R26.2)    Time: 8841-6606 PT Time Calculation (min) (ACUTE ONLY): 29 min   Charges:   PT Evaluation $PT Eval Low Complexity: 1 Low        Kati PT, DPT Acute Rehabilitation Services Pager: [phone] Office: [place] 07/09/2021, 2:29 PM

## 2021-07-09 NOTE — Evaluation (Signed)
Occupational Therapy Evaluation Patient Details Name: [name] MRN: [id number] DOB: 12-Feb-1941 Today's Date: 07/09/2021   History of Present Illness Pt is a 80 year old man admitted on 10/09 with fever, chills, fatigue and mouth/throat pain. +thrush, CAP, pancytopenia, neutropenia. Pt with extensive medical hx including RA, COPD, interstital lung disease/ pulmonary fibrosis on 4-5L 02 at home, chronic back pain s/p 5 back surgeries, OSA, HTN, atrial tachycardia and depression.   Clinical Impression   Pt ambulated with a rollator and was assisted for drying off after showering and IADL prior to admission. He lives with his wife in [place] at [facility]. Pt has AE at home for LB ADL, but was not needing to use. Pt presents with generalized weakness, decreased activity tolerance and impaired standing balance. He was able to ambulate in hall with multiple standing rest breaks on 6L 02. Will follow acutely. Recommending home with his supportive wife.      Recommendations for follow up therapy are one component of a multi-disciplinary discharge planning process, led by the attending physician.  Recommendations may be updated based on patient status, additional functional criteria and insurance authorization.   Follow Up Recommendations  Home health OT    Equipment Recommendations  None recommended by OT    Recommendations for Other Services       Precautions / Restrictions Precautions Precautions: Fall Precaution Comments: 4L at rest , 5L with activity at baseline      Mobility Bed Mobility Overal bed mobility: Needs Assistance Bed Mobility: Sit to Supine       Sit to supine: Supervision        Transfers Overall transfer level: Needs assistance Equipment used: Rolling walker (2 wheeled) Transfers: Sit to/from Stand Sit to Stand: Min guard              Balance Overall balance assessment: Needs assistance   Sitting balance-Leahy Scale: Fair       Standing  balance-Leahy Scale: Poor Standing balance comment: reliant on RW for dynamic balance, can stand statically without UE support                           ADL either performed or assessed with clinical judgement   ADL Overall ADL's : Needs assistance/impaired Eating/Feeding: Independent;Sitting   Grooming: Min guard;Standing   Upper Body Bathing: Minimal assistance;Sitting   Lower Body Bathing: Maximal assistance;Sit to/from stand   Upper Body Dressing : Minimal assistance;Sitting   Lower Body Dressing: Maximal assistance;Sit to/from stand   Toilet Transfer: Min guard;Ambulation;RW           Functional mobility during ADLs: Min guard;Rolling walker General ADL Comments: pt initiates rest breaks as needed     Vision Baseline Vision/History: 1 Wears glasses Patient Visual Report: No change from baseline       Perception     Praxis      Pertinent Vitals/Pain Pain Assessment: Faces Faces Pain Scale: No hurt     Hand Dominance Right   Extremity/Trunk Assessment Upper Extremity Assessment Upper Extremity Assessment: Overall WFL for tasks assessed   Lower Extremity Assessment Lower Extremity Assessment: Defer to PT evaluation   Cervical / Trunk Assessment Cervical / Trunk Assessment: Other exceptions Cervical / Trunk Exceptions: hx of chronic back pain and surgeries   Communication Communication Communication: No difficulties   Cognition Arousal/Alertness: Awake/alert Behavior During Therapy: WFL for tasks assessed/performed Overall Cognitive Status: Within Functional Limits for tasks assessed  General Comments       Exercises     Shoulder Instructions      Home Living Family/patient expects to be discharged to:: Private residence Living Arrangements: Spouse/significant other Available Help at Discharge: Family;Available 24 hours/day Type of Home: Independent living facility [facility]) Home Access: Level entry     Home Layout: One level     Bathroom Shower/Tub: Occupational psychologist: Handicapped height     Home Equipment: Other (comment);Cane - single point;Wheelchair - power;Shower seat;Hand held Tourist information centre manager - 4 wheels;Adaptive equipment (02, adjustable bed) Adaptive Equipment: Reacher;Sock aid;Long-handled shoe horn;Long-handled sponge        Prior Functioning/Environment Level of Independence: Needs assistance  Gait / Transfers Assistance Needed: walks with rollator ADL's / Homemaking Assistance Needed: assisted for drying off after showering and IADL.   Comments: uses 4L 02 at rest, 5L with exertion        OT Problem List: Decreased strength;Decreased activity tolerance;Impaired balance (sitting and/or standing);Cardiopulmonary status limiting activity;Obesity;Pain      OT Treatment/Interventions: Self-care/ADL training;DME and/or AE instruction;Energy conservation;Therapeutic activities;Patient/family education;Balance training    OT Goals(Current goals can be found in the care plan section) Acute Rehab OT Goals Patient Stated Goal: return home OT Goal Formulation: With patient Time For Goal Achievement: 07/23/21 Potential to Achieve Goals: Good ADL Goals Pt Will Perform Grooming: with supervision;standing (at least 2 activities) Pt Will Perform Lower Body Bathing: with supervision;with adaptive equipment;sit to/from stand Pt Will Perform Lower Body Dressing: with supervision;with adaptive equipment;sit to/from stand Pt Will Transfer to Toilet: with supervision;ambulating Pt Will Perform Toileting - Clothing Manipulation and hygiene: with supervision;sit to/from stand Additional ADL Goal #1: Pt will generalize energy conservation strategies in ADL independently.  OT Frequency: Min 2X/week   Barriers to D/C:            Co-evaluation              AM-PAC OT "6 Clicks" Daily Activity     Outcome Measure Help  from another person eating meals?: None Help from another person taking care of personal grooming?: A Little Help from another person toileting, which includes using toliet, bedpan, or urinal?: A Little Help from another person bathing (including washing, rinsing, drying)?: A Lot Help from another person to put on and taking off regular upper body clothing?: A Little Help from another person to put on and taking off regular lower body clothing?: A Lot 6 Click Score: 17   End of Session Equipment Utilized During Treatment: Rolling walker;Gait belt;Oxygen  Activity Tolerance: Patient tolerated treatment well Patient left: in bed;with call bell/phone within reach;with bed alarm set  OT Visit Diagnosis: Unsteadiness on feet (R26.81);Other abnormalities of gait and mobility (R26.89);Muscle weakness (generalized) (M62.81)                Time: 4166-0630 OT Time Calculation (min): 30 min Charges:  OT General Charges $OT Visit: 1 Visit OT Evaluation $OT Eval Moderate Complexity: 1 Mod  Nestor Lewandowsky, OTR/L Acute Rehabilitation Services Pager: [phone] Office: [phone]   Malka So 07/09/2021, 11:59 AM

## 2021-07-09 NOTE — Progress Notes (Signed)
Progress Note:    [name]    KLK:[id number] DOB: [birth year] DOA: 07/01/2021  PCP: Javier Glazier, MD    Brief Narrative:   80 year old male with extensive past medical history including rheumatoid arthritis, interstitial lung disease/pulmonary fibrosis, COPD, obstructive sleep apnea, chronic hypoxic respiratory failure on 4 L, hypertension, multifocal atrial tachycardia, chronic back pain, history of hardware associated vertebral osteomyelitis, history of splenectomy.  Presented to the emergency department with fever chills fatigue.  Was noted to be pancytopenic.  Found to have oral candidiasis.  Hospitalize for further management.      Subjective:   Patient continues to have pain in his mouth.  No significant improvement has been noted in the last 24 hours.  Denies any shortness of breath.  Occasional dry cough.  His wife is at the bedside.   Assessment and Plan:   Pancytopenia/neutropenic with fever: Patient was being seen by medical oncology/hematology.  Plan is for bone marrow biopsy.  The earliest this can be done is on Friday.  Patient was noted to be febrile and was started on cefepime.  Cultures have all been negative.  Apart from the community-acquired pneumonia no other source of infection has been found.  Antibiotics were narrowed down to ceftriaxone.  Also on azithromycin for community-acquired pneumonia as discussed below. Low-grade fever noted overnight.  WBC has been low.  We will recheck labs tomorrow.  Odynophagia/oral candidiasis Continue Diflucan.  HIV negative.  Hepatitis panel is negative except for hepatitis C antibody which is still pending.   Case was discussed with gastroenterology who recommended continuing current management.  Symptoms are stable.  Slightly better compared to how it was a few days ago.  Community-acquired pneumonia Stable.  Continue ceftriaxone and azithromycin.  Chronic hypoxic respiratory failure Continue oxygen.  Uses  4L at baseline.  History of pulmonary fibrosis, ILD Used to be on nintedanib.  But has not taken it in the last month or so.  Has been discontinued.  Irregular rhythm on telemetry/history of multifocal atrial tachycardia EKG shows sinus rhythm.  Has had episodes of tachycardia.  Metoprolol was resumed yesterday.  May need to increase the dose.    Rheumatoid arthritis Last dose of methotrexate was 10/8.  Currently on hold.  Essential hypertension/hypokalemia Soft blood pressures.  Home antihypertensives on hold. Potassium repleted.  Magnesium 2.0.  Chronic pain Continue tramadol, Cymbalta and gabapentin  Depression Continue home Zoloft, Cymbalta, trazodone  Dyslipidemia Continue Zocor  BPH Continue Flomax  COPD Stable.  Continue Breo, Spiriva, as needed albuterol, CPAP nightly  Obesity Estimated body mass index is 34.63 kg/m as calculated from the following:   Height as of this encounter: _0  (1.702 m).   Weight as of this encounter: 100.3 kg.    Other information:    DVT prophylaxis: Lovenox Code Status: DNR Family Communication: Patient and wife were updated at bedside. Disposition: Hopefully return home when improved.  PT and OT evaluation.  Status is: Inpatient  Remains inpatient appropriate because:Ongoing diagnostic testing needed not appropriate for outpatient work up  Dispo: The patient is from: ILF              Anticipated d/c is to: ILF              Patient currently is not medically stable to d/c.   Difficult to place patient No      Consultants:   Oncology Interventional radiology    Objective:    Vitals:   07/09/21  6629 07/09/21 0742 07/09/21 0801 07/09/21 0918  BP:    119/70  Pulse:    94  Resp:    20  Temp:    98.1 F (36.7 C)  TempSrc:      SpO2: 100% 100% 100% 98%  Weight:      Height:        Intake/Output Summary (Last 24 hours) at 07/09/2021 1000 Last data filed at 07/09/2021 0500 Gross per 24 hour  Intake 1810 ml   Output 1895 ml  Net -85 ml    Filed Weights   07/23/2021 1141 07/24/2021 2009  Weight: 100.2 kg 100.3 kg       Physical Exam:    General appearance: Awake alert.  In no distress Continues to have denuded area in the oral mucosa. Resp: Normal effort at rest.  Coarse breath sounds bilaterally with few crackles at the bases.  No wheezing or rhonchi. Cardio: S1-S2 is normal regular.  No S3-S4.  No rubs murmurs or bruit GI: Abdomen is soft.  Nontender nondistended.  Bowel sounds are present normal.  No masses organomegaly Extremities: No edema.  Full range of motion of lower extremities. Neurologic: Alert and oriented x3.  No focal neurological deficits.     Data Reviewed:    I have personally reviewed following labs and imaging studies  CBC: Recent Labs  Lab 07/09/2021 1209 07/06/21 0408 07/07/21 0334 07/08/21 0344  WBC 1.5* 1.7* 1.2* 1.7*  NEUTROABS 0.6* CRITICAL RESULT CALLED TO, READ BACK BY AND VERIFIED WITH: 0.1* 0.2*  HGB 10.3* 10.5* 9.6* 9.4*  HCT 30.1* 31.8* 28.9* 28.6*  MCV 114.0* 114.8* 114.2* 116.7*  PLT 33* 47* 92* 170     Basic Metabolic Panel: Recent Labs  Lab 06/30/2021 1054 07/06/21 0408 07/07/21 0334 07/08/21 0344 07/09/21 0329  NA 134* 136 132* 134* 133*  K 3.0* 3.7 3.5 3.3* 4.0  CL 96* 102 99 98 98  CO2 _0 GLUCOSE 95 100* 106* 96 126*  BUN _1 CREATININE 0.87 0.70 0.61 0.62 0.61  CALCIUM 8.4* 8.3* 8.2* 8.4* 8.6*  MG  --  2.0  --   --  2.0     GFR: Estimated Creatinine Clearance: 84.5 mL/min (by C-G formula based on SCr of 0.61 mg/dL).  Liver Function Tests: Recent Labs  Lab 06/28/2021 1054 07/06/21 0408 07/07/21 0334 07/08/21 0344  AST 44* 33 23 26  ALT 77* 65* 53* 49*  ALKPHOS 44 38 34* 36*  BILITOT 1.0 0.9 1.2 1.1  PROT 6.3* 5.4* 4.9* 5.1*  ALBUMIN 3.3* 2.9* 2.5* 2.4*     CBG: No results for input(s): GLUCAP in the last 168 hours.   Recent Results (from the past 240 hour(s))  Culture, blood  (Routine x 2)     Status: None (Preliminary result)   Collection Time: 06/30/2021 10:54 AM   Specimen: Right Antecubital; Blood  Result Value Ref Range Status   Specimen Description   Final    RIGHT ANTECUBITAL BLOOD Performed at [facility], [address] [place], [place] [address]    Special Requests   Final    Blood Culture adequate volume BOTTLES DRAWN AEROBIC AND ANAEROBIC Performed at [facility], [address], [place], [place] [address]    Culture   Final    NO GROWTH 4 DAYS Performed at [facility], [place] [address] [place],  [address]    Report Status PENDING  Incomplete  Culture, blood (Routine x 2)     Status: None (Preliminary result)   Collection Time: 07/14/2021 11:20 AM   Specimen: Left Antecubital; Blood  Result Value Ref Range Status   Specimen Description   Final    LEFT ANTECUBITAL BLOOD Performed at [facility], [address] [place], [place] [address]    Special Requests   Final    Blood Culture adequate volume BOTTLES DRAWN AEROBIC AND ANAEROBIC Performed at [facility], [address], [place], [place] [address]    Culture   Final    NO GROWTH 4 DAYS Performed at [facility], [place] [address] [place], [place] [address]    Report Status PENDING  Incomplete  Resp Panel by RT-PCR (Flu A&B, Covid) Nasopharyngeal Swab     Status: None   Collection Time: 07/18/2021 11:42 AM   Specimen: Nasopharyngeal Swab; Nasopharyngeal(NP) swabs in vial transport medium  Result Value Ref Range Status   SARS Coronavirus 2 by RT PCR NEGATIVE NEGATIVE Final    Comment: (NOTE) SARS-CoV-2 target nucleic acids are NOT DETECTED.  The SARS-CoV-2 RNA is generally detectable in upper respiratory specimens during the acute phase of infection. The lowest concentration of SARS-CoV-2 viral copies this assay can detect is 138 copies/mL. A negative result does not preclude SARS-Cov-2 infection and should not be used as  the sole basis for treatment or other patient management decisions. A negative result may occur with  improper specimen collection/handling, submission of specimen other than nasopharyngeal swab, presence of viral mutation(s) within the areas targeted by this assay, and inadequate number of viral copies(<138 copies/mL). A negative result must be combined with clinical observations, patient history, and epidemiological information. The expected result is Negative.  Fact Sheet for Patients:  EntrepreneurPulse.com.au  Fact Sheet for Healthcare Providers:  IncredibleEmployment.be  This test is no t yet approved or cleared by the [place] FDA and  has been authorized for detection and/or diagnosis of SARS-CoV-2 by FDA under an Emergency Use Authorization (EUA). This EUA will remain  in effect (meaning this test can be used) for the duration of the COVID-19 declaration under Section 564(b)(1) of the Act, 21 U.S.C.section 360bbb-3(b)(1), unless the authorization is terminated  or revoked sooner.       Influenza A by PCR NEGATIVE NEGATIVE Final   Influenza B by PCR NEGATIVE NEGATIVE Final    Comment: (NOTE) The Xpert Xpress SARS-CoV-2/FLU/RSV plus assay is intended as an aid in the diagnosis of influenza from Nasopharyngeal swab specimens and should not be used as a sole basis for treatment. Nasal washings and aspirates are unacceptable for Xpert Xpress SARS-CoV-2/FLU/RSV testing.  Fact Sheet for Patients: EntrepreneurPulse.com.au  Fact Sheet for Healthcare Providers: IncredibleEmployment.be  This test is not yet approved or cleared by the [place] FDA and has been authorized for detection and/or diagnosis of SARS-CoV-2 by FDA under an Emergency Use Authorization (EUA). This EUA will remain in effect (meaning this test can be used) for the duration of the COVID-19 declaration under Section 564(b)(1) of  the Act, 21 U.S.C. section 360bbb-3(b)(1), unless the authorization is terminated or revoked.  Performed at [facility], [place] [place], [place] [address]   Respiratory (~20 pathogens) panel by PCR     Status: None   Collection Time: 06/30/2021 12:09 PM   Specimen: Nasopharyngeal Swab; Respiratory  Result Value Ref Range Status   Adenovirus NOT DETECTED NOT DETECTED Final   Coronavirus  229E NOT DETECTED NOT DETECTED Final    Comment: (NOTE) The Coronavirus on the Respiratory Panel, DOES NOT test for the novel  Coronavirus (2019 nCoV)    Coronavirus HKU1 NOT DETECTED NOT DETECTED Final   Coronavirus NL63 NOT DETECTED NOT DETECTED Final   Coronavirus OC43 NOT DETECTED NOT DETECTED Final   Metapneumovirus NOT DETECTED NOT DETECTED Final   Rhinovirus / Enterovirus NOT DETECTED NOT DETECTED Final   Influenza A NOT DETECTED NOT DETECTED Final   Influenza B NOT DETECTED NOT DETECTED Final   Parainfluenza Virus 1 NOT DETECTED NOT DETECTED Final   Parainfluenza Virus 2 NOT DETECTED NOT DETECTED Final   Parainfluenza Virus 3 NOT DETECTED NOT DETECTED Final   Parainfluenza Virus 4 NOT DETECTED NOT DETECTED Final   Respiratory Syncytial Virus NOT DETECTED NOT DETECTED Final   Bordetella pertussis NOT DETECTED NOT DETECTED Final   Bordetella Parapertussis NOT DETECTED NOT DETECTED Final   Chlamydophila pneumoniae NOT DETECTED NOT DETECTED Final   Mycoplasma pneumoniae NOT DETECTED NOT DETECTED Final    Comment: Performed at [facility], [place] [address], [place], [place] [address]  Urine Culture     Status: None   Collection Time: 06/27/2021  7:04 PM   Specimen: In/Out Cath Urine  Result Value Ref Range Status   Specimen Description   Final    IN/OUT CATH URINE Performed at [place] [address] [place], [place] [address]    Special Requests   Final    NONE Performed at [facility], [place] [address] [place],  [place] [address]    Culture   Final    NO GROWTH Performed at [facility], [place] [address] [place], [place] [address]    Report Status 07/07/2021 FINAL  Final          Radiology Studies:    No results found.      Medications:    Scheduled Meds:  albuterol  2.5 mg Nebulization BID   DULoxetine  60 mg Oral QPM   finasteride  5 mg Oral QHS   fluticasone furoate-vilanterol  1 puff Inhalation Daily   folic acid  1 mg Oral Daily   gabapentin  600 mg Oral TID   ipratropium  0.5 mg Nebulization BID   metoprolol tartrate  25 mg Oral BID   sertraline  100 mg Oral Daily   simvastatin  40 mg Oral QHS   tamsulosin  0.4 mg Oral QHS   traZODone  50 mg Oral QHS   Continuous Infusions:  sodium chloride 10 mL/hr (07/07/21 1900)   azithromycin Stopped (07/08/21 2130)   cefTRIAXone (ROCEPHIN)  IV Stopped (07/08/21 1900)   fluconazole (DIFLUCAN) IV Stopped (07/08/21 2313)       LOS: 4 days    Bonnielee Haff, MD [facility]  07/09/2021, 10:00 AM

## 2021-07-10 ENCOUNTER — Inpatient Hospital Stay (HOSPITAL_COMMUNITY): Payer: Medicare PPO

## 2021-07-10 DIAGNOSIS — D61818 Other pancytopenia: Secondary | ICD-10-CM | POA: Diagnosis not present

## 2021-07-10 DIAGNOSIS — L8990 Pressure ulcer of unspecified site, unspecified stage: Secondary | ICD-10-CM | POA: Insufficient documentation

## 2021-07-10 DIAGNOSIS — B370 Candidal stomatitis: Secondary | ICD-10-CM | POA: Diagnosis not present

## 2021-07-10 DIAGNOSIS — J42 Unspecified chronic bronchitis: Secondary | ICD-10-CM | POA: Diagnosis not present

## 2021-07-10 DIAGNOSIS — D709 Neutropenia, unspecified: Secondary | ICD-10-CM | POA: Diagnosis not present

## 2021-07-10 LAB — BLOOD GAS, ARTERIAL
Acid-Base Excess: 6.2 mmol/L — ABNORMAL HIGH (ref 0.0–2.0)
Acid-Base Excess: 7.3 mmol/L — ABNORMAL HIGH (ref 0.0–2.0)
Bicarbonate: 33.7 mmol/L — ABNORMAL HIGH (ref 20.0–28.0)
Bicarbonate: 34.1 mmol/L — ABNORMAL HIGH (ref 20.0–28.0)
Drawn by: 22052
Expiratory PAP: 8
FIO2: 45
Inspiratory PAP: 16
Mode: POSITIVE
O2 Saturation: 93.5 %
O2 Saturation: 98.5 %
Patient temperature: 98.6
Patient temperature: 98.7
RATE: 8 {breaths}/min
pCO2 arterial: 67.1 mmHg (ref 32.0–48.0)
pCO2 arterial: 65.9 mmHg (ref 32.0–48.0)
pH, Arterial: 7.321 — ABNORMAL LOW (ref 7.350–7.450)
pH, Arterial: 7.334 — ABNORMAL LOW (ref 7.350–7.450)
pO2, Arterial: 74.9 mmHg — ABNORMAL LOW (ref 83.0–108.0)
pO2, Arterial: 126 mmHg — ABNORMAL HIGH (ref 83.0–108.0)

## 2021-07-10 LAB — CULTURE, BLOOD (ROUTINE X 2)
Culture: NO GROWTH
Culture: NO GROWTH
Special Requests: ADEQUATE
Special Requests: ADEQUATE

## 2021-07-10 LAB — CBC WITH DIFFERENTIAL/PLATELET
Abs Immature Granulocytes: 0.05 10*3/uL (ref 0.00–0.07)
Basophils Absolute: 0 10*3/uL (ref 0.0–0.1)
Basophils Relative: 1 %
Eosinophils Absolute: 0.3 10*3/uL (ref 0.0–0.5)
Eosinophils Relative: 13 %
HCT: 29.8 % — ABNORMAL LOW (ref 39.0–52.0)
Hemoglobin: 9.7 g/dL — ABNORMAL LOW (ref 13.0–17.0)
Immature Granulocytes: 2 %
Lymphocytes Relative: 37 %
Lymphs Abs: 1 10*3/uL (ref 0.7–4.0)
MCH: 38.6 pg — ABNORMAL HIGH (ref 26.0–34.0)
MCHC: 32.6 g/dL (ref 30.0–36.0)
MCV: 118.7 fL — ABNORMAL HIGH (ref 80.0–100.0)
Monocytes Absolute: 0.5 10*3/uL (ref 0.1–1.0)
Monocytes Relative: 20 %
Neutro Abs: 0.7 10*3/uL — ABNORMAL LOW (ref 1.7–7.7)
Neutrophils Relative %: 27 %
Platelets: 241 10*3/uL (ref 150–400)
RBC: 2.51 MIL/uL — ABNORMAL LOW (ref 4.22–5.81)
RDW: 13.9 % (ref 11.5–15.5)
WBC: 2.6 10*3/uL — ABNORMAL LOW (ref 4.0–10.5)
nRBC: 1.9 % — ABNORMAL HIGH (ref 0.0–0.2)

## 2021-07-10 LAB — BASIC METABOLIC PANEL
Anion gap: 6 (ref 5–15)
BUN: 10 mg/dL (ref 8–23)
CO2: 33 mmol/L — ABNORMAL HIGH (ref 22–32)
Calcium: 8.5 mg/dL — ABNORMAL LOW (ref 8.9–10.3)
Chloride: 94 mmol/L — ABNORMAL LOW (ref 98–111)
Creatinine, Ser: 0.72 mg/dL (ref 0.61–1.24)
GFR, Estimated: >60 mL/min (ref >60)
Glucose, Bld: 105 mg/dL — ABNORMAL HIGH (ref 70–99)
Potassium: 3.8 mmol/L (ref 3.5–5.1)
Sodium: 133 mmol/L — ABNORMAL LOW (ref 135–145)

## 2021-07-10 MED ORDER — SERTRALINE HCL 100 MG PO TABS
100.0000 mg | ORAL_TABLET | Freq: Every day | ORAL | Status: DC
  Administered 2021-07-11 – 2021-07-14 (×4): 100 mg via ORAL
  Filled 2021-07-10 (×4): qty 1

## 2021-07-10 MED ORDER — NALOXONE HCL 0.4 MG/ML IJ SOLN
INTRAMUSCULAR | Status: AC
  Filled 2021-07-10: qty 1

## 2021-07-10 MED ORDER — CHLORHEXIDINE GLUCONATE CLOTH 2 % EX PADS
6.0000 | MEDICATED_PAD | Freq: Every day | CUTANEOUS | Status: DC
  Administered 2021-07-10 – 2021-07-14 (×5): 6 via TOPICAL

## 2021-07-10 MED ORDER — NALOXONE HCL 0.4 MG/ML IJ SOLN
0.4000 mg | INTRAMUSCULAR | Status: DC | PRN
  Administered 2021-07-10: 0.4 mg via INTRAVENOUS
  Filled 2021-07-10: qty 1

## 2021-07-10 MED ORDER — SIMVASTATIN 40 MG PO TABS
40.0000 mg | ORAL_TABLET | Freq: Every day | ORAL | Status: DC
  Administered 2021-07-11 – 2021-07-14 (×4): 40 mg via ORAL
  Filled 2021-07-10 (×4): qty 1

## 2021-07-10 MED ORDER — MIDAZOLAM HCL 2 MG/2ML IJ SOLN
INTRAMUSCULAR | Status: AC
  Filled 2021-07-10: qty 2

## 2021-07-10 MED ORDER — GABAPENTIN 300 MG PO CAPS
600.0000 mg | ORAL_CAPSULE | Freq: Three times a day (TID) | ORAL | Status: DC
  Administered 2021-07-11 – 2021-07-14 (×12): 600 mg via ORAL
  Filled 2021-07-10 (×13): qty 2

## 2021-07-10 MED ORDER — CHLORHEXIDINE GLUCONATE 0.12 % MT SOLN
15.0000 mL | Freq: Two times a day (BID) | OROMUCOSAL | Status: DC
  Administered 2021-07-10 – 2021-07-14 (×6): 15 mL via OROMUCOSAL
  Filled 2021-07-10 (×8): qty 15

## 2021-07-10 MED ORDER — METOPROLOL TARTRATE 5 MG/5ML IV SOLN
2.5000 mg | Freq: Four times a day (QID) | INTRAVENOUS | Status: DC | PRN
  Administered 2021-07-11 (×3): 2.5 mg via INTRAVENOUS
  Filled 2021-07-10 (×3): qty 5

## 2021-07-10 MED ORDER — FENTANYL CITRATE (PF) 100 MCG/2ML IJ SOLN
INTRAMUSCULAR | Status: AC
  Filled 2021-07-10: qty 2

## 2021-07-10 MED ORDER — SODIUM CHLORIDE 0.9 % IV SOLN
INTRAVENOUS | Status: AC
  Filled 2021-07-10: qty 250

## 2021-07-10 MED ORDER — FLUMAZENIL 0.5 MG/5ML IV SOLN
INTRAVENOUS | Status: AC
  Filled 2021-07-10: qty 5

## 2021-07-10 MED ORDER — DULOXETINE HCL 60 MG PO CPEP
60.0000 mg | ORAL_CAPSULE | Freq: Every evening | ORAL | Status: DC
  Administered 2021-07-11 – 2021-07-14 (×4): 60 mg via ORAL
  Filled 2021-07-10 (×2): qty 1
  Filled 2021-07-10: qty 2
  Filled 2021-07-10: qty 1

## 2021-07-10 MED ORDER — METOPROLOL TARTRATE 25 MG PO TABS
25.0000 mg | ORAL_TABLET | Freq: Two times a day (BID) | ORAL | Status: DC
  Administered 2021-07-11: 25 mg via ORAL
  Filled 2021-07-10: qty 1

## 2021-07-10 MED ORDER — ORAL CARE MOUTH RINSE
15.0000 mL | Freq: Two times a day (BID) | OROMUCOSAL | Status: DC
  Administered 2021-07-10 – 2021-07-14 (×6): 15 mL via OROMUCOSAL

## 2021-07-10 MED ORDER — ENOXAPARIN SODIUM 40 MG/0.4ML IJ SOSY
40.0000 mg | PREFILLED_SYRINGE | INTRAMUSCULAR | Status: DC
  Administered 2021-07-10 – 2021-07-14 (×5): 40 mg via SUBCUTANEOUS
  Filled 2021-07-10 (×4): qty 0.4

## 2021-07-10 MED ORDER — TRAZODONE HCL 50 MG PO TABS
50.0000 mg | ORAL_TABLET | Freq: Every day | ORAL | Status: DC
  Administered 2021-07-11 – 2021-07-14 (×4): 50 mg via ORAL
  Filled 2021-07-10 (×3): qty 1

## 2021-07-10 NOTE — Progress Notes (Signed)
Patient given 29m Versed and 573m Fentanyl by this RN per protocol to initiate conscious sedation for ordered Bone Marrow Aspiration/Biopsy by Dr YaKathlene CoteWithin 1 minute, patient noted to be hypoxic and unresponsive. Rolled patient from prone position on CT table to hospital bed into supine and then high fowlers position and NRB mask at 15L/min placed on patient. 0.96m46marcan given IV per protocol followed by 0.96mg11mmazicon per protocol, with subsequent O2 sats noted to climb into mid-90s Patient responded appropriately to command to open his eyes. RRT notified by CT staff during transfer from table to bed and arrived at bedside to assess patient and assist. Dr YamaKathlene Coteo notified by CT staff and arrived at bedside. Additional 0.2 mg Narcan administered by RRT RN per Dr YamaKathlene Cote patient's lethargy with improvement noted. Patient oral suctioned by RRT RN for congested cough/thick sputum. Patient transported back to Room 1431 by RRT.

## 2021-07-10 NOTE — Progress Notes (Signed)
Interventional Radiology Progress Note  Patient brought down to CT for bone marrow biopsy, placed in prone position and given initial dose of 1 mg Versed and 50 mcg Fentanyl IV. Became severely hypoxic to O2 sat of 57% on oxygen and unresponsive. Given flumazenil, narcan and turned back to supine position. Rapid Response called. O2 sat back to 100% on NRB mask. Patient more responsive but not fully responsive so given additional reversal medication.  Plan: Cancel bone marrow biopsy today. Patient is a very high risk to sedate due to poor airway and history of OSA requiring CPAP at home. Will have to readdress situation Monday if still in hospital and may need Anesthesia involvement if bone marrow biopsy needed during hospital admission.  [name]. Kathlene Cote, M.D Pager:  [phone]

## 2021-07-10 NOTE — Progress Notes (Signed)
Progress Note:    [name]    INO:[id number] DOB: 10-16-1940 DOA: 07/18/2021  PCP: Javier Glazier, MD    Brief Narrative:   80 year old male with extensive past medical history including rheumatoid arthritis, interstitial lung disease/pulmonary fibrosis, COPD, obstructive sleep apnea, chronic hypoxic respiratory failure on 4 L, hypertension, multifocal atrial tachycardia, chronic back pain, history of hardware associated vertebral osteomyelitis, history of splenectomy.  Presented to the emergency department with fever chills fatigue.  Was noted to be pancytopenic.  Found to have oral candidiasis.  Hospitalize for further management.      Subjective:   Patient was seen earlier this morning.  He was looking forward to his bone marrow biopsy.  Continue to have pain in his mouth.  Denies any chest pain.  Denies any shortness of breath.  Patient went down for his bone marrow biopsy. However after receiving 1 mg of Versed and 50 mcg of fentanyl patient became severely hypoxic with saturations in the 50s.  Rapid response was called.  Patient was given flumazenil and Narcan.  He was given additional dose of Narcan.  Patient was reevaluated at bedside.  Noted to be tachypneic.  Somnolent but arousable.  Pupils are round, not pinpoint.  Lungs shows diminished air entry at the bases.  No crackles appreciated.  No wheezing.      Assessment and Plan:   Acute respiratory distress with acute hypoxia Likely due to medication effect.  Patient has been given Narcan and flumazenil.  His end-tidal CO2 is in the late 46s.  Chest x-ray and ABG has been ordered.  He will be transferred to stepdown unit for closer monitoring and possible noninvasive mechanical ventilation if indicated.  Discussed with his wife.  Pancytopenia/neutropenic with fever: Patient was being seen by medical oncology/hematology.   Plan was for bone marrow biopsy which was attempted this morning but had to be canceled  due to respiratory distress.   Patient was noted to be febrile and was started on cefepime.  Cultures have all been negative.  Apart from the community-acquired pneumonia no other source of infection has been found.  Antibiotics were narrowed down to ceftriaxone.  Also on azithromycin for community-acquired pneumonia as discussed below. No further episodes of fever noted. WBC noted to be 2.6 today.  Hemoglobin 9.7.  Platelet counts have become normal.  Odynophagia/oral candidiasis Continue Diflucan.  HIV negative.  Hepatitis panel is unremarkable.  Case was discussed with gastroenterology who recommended continuing current management.  Symptoms are stable.  Slightly better compared to how it was a few days ago.  Community-acquired pneumonia Stable.  Continue ceftriaxone and azithromycin.  We will plan for a 7-day course.  Chronic hypoxic respiratory failure Continue oxygen.  Uses 4L at baseline.  See above  History of pulmonary fibrosis, ILD Used to be on nintedanib.  But has not taken it in the last month or so.  Has been discontinued.  Irregular rhythm on telemetry/history of multifocal atrial tachycardia EKG shows sinus rhythm.  Has had episodes of tachycardia.  Metoprolol was resumed.  May need to increase the dose.    Rheumatoid arthritis Last dose of methotrexate was 10/8.  Currently on hold.  Essential hypertension/hypokalemia/hyponatremia Soft blood pressures.  Home antihypertensives on hold. Potassium repleted.  Magnesium was 2.0.  Chronic pain Continue tramadol, Cymbalta and gabapentin.  Caution with use today considering episode as noted above.  Depression Continue home Zoloft, Cymbalta, trazodone.  Use caution with the use today considering episode as  noted above.  Dyslipidemia Continue Zocor  BPH Continue Flomax  COPD Stable.  Continue Breo, Spiriva, as needed albuterol, CPAP nightly  Obesity Estimated body mass index is 34.63 kg/m as calculated from the  following:   Height as of this encounter: _0  (1.702 m).   Weight as of this encounter: 100.3 kg.    Other information:    DVT prophylaxis: Lovenox Code Status: DNR Family Communication: Updated patient and wife at bedside Disposition: Hopefully return home when improved.  PT and OT evaluation.  Status is: Inpatient  Remains inpatient appropriate because:Ongoing diagnostic testing needed not appropriate for outpatient work up  Dispo: The patient is from: ILF              Anticipated d/c is to: ILF              Patient currently is not medically stable to d/c.   Difficult to place patient No      Consultants:   Oncology Interventional radiology    Objective:    Vitals:   07/10/21 1051 07/10/21 1052 07/10/21 1055 07/10/21 1100  BP:  (!) 150/100 121/75 107/75  Pulse:      Resp:      Temp:      TempSrc:      SpO2: (!) 88% 92%  97%  Weight:      Height:        Intake/Output Summary (Last 24 hours) at 07/10/2021 1133 Last data filed at 07/10/2021 0600 Gross per 24 hour  Intake 453.64 ml  Output 500 ml  Net -46.36 ml    Filed Weights   06/28/2021 1141 07/10/2021 2009  Weight: 100.2 kg 100.3 kg       Physical Exam:    General appearance: Somnolent but easily arousable Resp: Tachypneic.  No use of accessory muscles.  Diminished air entry at the bases Cardio: S1-S2 is normal regular.  No S3-S4.  No rubs murmurs or bruit GI: Abdomen is soft.  Nontender nondistended.  Bowel sounds are present normal.  No masses organomegaly Extremities: No edema.  Full range of motion of lower extremities. Neurologic:   No focal neurological deficits.     Data Reviewed:    I have personally reviewed following labs and imaging studies  CBC: Recent Labs  Lab 07/06/2021 1209 07/06/21 0408 07/07/21 0334 07/08/21 0344 07/09/21 2336  WBC 1.5* 1.7* 1.2* 1.7* 2.6*  NEUTROABS 0.6* CRITICAL RESULT CALLED TO, READ BACK BY AND VERIFIED WITH: 0.1* 0.2* 0.7*  HGB 10.3*  10.5* 9.6* 9.4* 9.7*  HCT 30.1* 31.8* 28.9* 28.6* 29.8*  MCV 114.0* 114.8* 114.2* 116.7* 118.7*  PLT 33* 47* 92* 170 241     Basic Metabolic Panel: Recent Labs  Lab 07/06/21 0408 07/07/21 0334 07/08/21 0344 07/09/21 0329 07/10/21 0042  NA 136 132* 134* 133* 133*  K 3.7 3.5 3.3* 4.0 3.8  CL 102 99 98 98 94*  CO2 _1 33*  GLUCOSE 100* 106* 96 126* 105*  BUN _2 CREATININE 0.70 0.61 0.62 0.61 0.72  CALCIUM 8.3* 8.2* 8.4* 8.6* 8.5*  MG 2.0  --   --  2.0  --      GFR: Estimated Creatinine Clearance: 84.5 mL/min (by C-G formula based on SCr of 0.72 mg/dL).  Liver Function Tests: Recent Labs  Lab 07/20/2021 1054 07/06/21 0408 07/07/21 0334 07/08/21 0344  AST 44* 33 23 26  ALT 77* 65* 53* 49*  ALKPHOS 44 38 34* 36*  BILITOT 1.0 0.9 1.2 1.1  PROT 6.3* 5.4* 4.9* 5.1*  ALBUMIN 3.3* 2.9* 2.5* 2.4*      Recent Results (from the past 240 hour(s))  Culture, blood (Routine x 2)     Status: None   Collection Time: 07/21/2021 10:54 AM   Specimen: Right Antecubital; Blood  Result Value Ref Range Status   Specimen Description   Final    RIGHT ANTECUBITAL BLOOD Performed at [facility], [address] [place], [place] [address]    Special Requests   Final    Blood Culture adequate volume BOTTLES DRAWN AEROBIC AND ANAEROBIC Performed at [facility], [address], [place], [place] [address]    Culture   Final    NO GROWTH 5 DAYS Performed at [facility], [place] [address], [place], [place] [address]    Report Status 07/10/2021 FINAL  Final  Culture, blood (Routine x 2)     Status: None   Collection Time: 07/25/2021 11:20 AM   Specimen: Left Antecubital; Blood  Result Value Ref Range Status   Specimen Description   Final    LEFT ANTECUBITAL BLOOD Performed at [facility], [address] [place], [place] [address]    Special Requests   Final    Blood Culture adequate volume BOTTLES DRAWN AEROBIC AND  ANAEROBIC Performed at [facility], [address] [place], [place] [address]    Culture   Final    NO GROWTH 5 DAYS Performed at [facility], [place] [address], [place], [place] [address]    Report Status 07/10/2021 FINAL  Final  Resp Panel by RT-PCR (Flu A&B, Covid) Nasopharyngeal Swab     Status: None   Collection Time: 06/29/2021 11:42 AM   Specimen: Nasopharyngeal Swab; Nasopharyngeal(NP) swabs in vial transport medium  Result Value Ref Range Status   SARS Coronavirus 2 by RT PCR NEGATIVE NEGATIVE Final    Comment: (NOTE) SARS-CoV-2 target nucleic acids are NOT DETECTED.  The SARS-CoV-2 RNA is generally detectable in upper respiratory specimens during the acute phase of infection. The lowest concentration of SARS-CoV-2 viral copies this assay can detect is 138 copies/mL. A negative result does not preclude SARS-Cov-2 infection and should not be used as the sole basis for treatment or other patient management decisions. A negative result may occur with  improper specimen collection/handling, submission of specimen other than nasopharyngeal swab, presence of viral mutation(s) within the areas targeted by this assay, and inadequate number of viral copies(<138 copies/mL). A negative result must be combined with clinical observations, patient history, and epidemiological information. The expected result is Negative.  Fact Sheet for Patients:  EntrepreneurPulse.com.au  Fact Sheet for Healthcare Providers:  IncredibleEmployment.be  This test is no t yet approved or cleared by the [place] FDA and  has been authorized for detection and/or diagnosis of SARS-CoV-2 by FDA under an Emergency Use Authorization (EUA). This EUA will remain  in effect (meaning this test can be used) for the duration of the COVID-19 declaration under Section 564(b)(1) of the Act, 21 U.S.C.section 360bbb-3(b)(1), unless the authorization is terminated   or revoked sooner.       Influenza A by PCR NEGATIVE NEGATIVE Final   Influenza B by PCR NEGATIVE NEGATIVE Final    Comment: (NOTE) The Xpert Xpress SARS-CoV-2/FLU/RSV plus assay is intended as an aid in the diagnosis of influenza from Nasopharyngeal swab specimens and should not be used as a  sole basis for treatment. Nasal washings and aspirates are unacceptable for Xpert Xpress SARS-CoV-2/FLU/RSV testing.  Fact Sheet for Patients: EntrepreneurPulse.com.au  Fact Sheet for Healthcare Providers: IncredibleEmployment.be  This test is not yet approved or cleared by the [place] FDA and has been authorized for detection and/or diagnosis of SARS-CoV-2 by FDA under an Emergency Use Authorization (EUA). This EUA will remain in effect (meaning this test can be used) for the duration of the COVID-19 declaration under Section 564(b)(1) of the Act, 21 U.S.C. section 360bbb-3(b)(1), unless the authorization is terminated or revoked.  Performed at [facility], [address] [place], [place] [address]   Respiratory (~20 pathogens) panel by PCR     Status: None   Collection Time: 07/06/2021 12:09 PM   Specimen: Nasopharyngeal Swab; Respiratory  Result Value Ref Range Status   Adenovirus NOT DETECTED NOT DETECTED Final   Coronavirus 229E NOT DETECTED NOT DETECTED Final    Comment: (NOTE) The Coronavirus on the Respiratory Panel, DOES NOT test for the novel  Coronavirus (2019 nCoV)    Coronavirus HKU1 NOT DETECTED NOT DETECTED Final   Coronavirus NL63 NOT DETECTED NOT DETECTED Final   Coronavirus OC43 NOT DETECTED NOT DETECTED Final   Metapneumovirus NOT DETECTED NOT DETECTED Final   Rhinovirus / Enterovirus NOT DETECTED NOT DETECTED Final   Influenza A NOT DETECTED NOT DETECTED Final   Influenza B NOT DETECTED NOT DETECTED Final   Parainfluenza Virus 1 NOT DETECTED NOT DETECTED Final   Parainfluenza Virus 2 NOT DETECTED NOT  DETECTED Final   Parainfluenza Virus 3 NOT DETECTED NOT DETECTED Final   Parainfluenza Virus 4 NOT DETECTED NOT DETECTED Final   Respiratory Syncytial Virus NOT DETECTED NOT DETECTED Final   Bordetella pertussis NOT DETECTED NOT DETECTED Final   Bordetella Parapertussis NOT DETECTED NOT DETECTED Final   Chlamydophila pneumoniae NOT DETECTED NOT DETECTED Final   Mycoplasma pneumoniae NOT DETECTED NOT DETECTED Final    Comment: Performed at [facility], [place]. [address] [place], [place] [address]  Urine Culture     Status: None   Collection Time: 07/07/2021  7:04 PM   Specimen: In/Out Cath Urine  Result Value Ref Range Status   Specimen Description   Final    IN/OUT CATH URINE Performed at [place] [address], [place], [place] [address]    Special Requests   Final    NONE Performed at [facility], [place] [address] [place], [place] [address]    Culture   Final    NO GROWTH Performed at [facility], [place] [address] [place], [place] [address]    Report Status 07/07/2021 FINAL  Final  MRSA Next Gen by PCR, Nasal     Status: None   Collection Time: 07/09/21 10:18 AM   Specimen: Nasal Mucosa; Nasal Swab  Result Value Ref Range Status   MRSA by PCR Next Gen NOT DETECTED NOT DETECTED Final    Comment: (NOTE) The GeneXpert MRSA Assay (FDA approved for NASAL specimens only), is one component of a comprehensive MRSA colonization surveillance program. It is not intended to diagnose MRSA infection nor to guide or monitor treatment for MRSA infections. Test performance is not FDA approved in patients less than 62 years old. Performed at [facility], [place] [address], [place], [place] [address]           Radiology Studies:    No results found.  Medications:    Scheduled Meds:  albuterol  2.5 mg Nebulization BID   DULoxetine  60 mg Oral QPM   [COMPLETED] fentaNYL       finasteride  5 mg Oral QHS    [COMPLETED] flumazenil       fluticasone furoate-vilanterol  1 puff Inhalation Daily   folic acid  1 mg Oral Daily   gabapentin  600 mg Oral TID   ipratropium  0.5 mg Nebulization BID   metoprolol tartrate  25 mg Oral BID   [COMPLETED] midazolam       [COMPLETED] naloxone       sertraline  100 mg Oral Daily   simvastatin  40 mg Oral QHS   tamsulosin  0.4 mg Oral QHS   traZODone  50 mg Oral QHS   Continuous Infusions:  sodium chloride 10 mL/hr (07/07/21 1900)   azithromycin 500 mg (07/09/21 2003)   cefTRIAXone (ROCEPHIN)  IV 1 g (07/09/21 1742)   fluconazole (DIFLUCAN) IV 200 mg (07/09/21 2118)   sodium chloride         LOS: 5 days    Bonnielee Haff, MD [facility]  07/10/2021, 11:33 AM

## 2021-07-10 NOTE — Progress Notes (Signed)
Patient transferred to ICU for low SAT of 50% post receiving Versed for Bone Marrow Biopsy, patient received Narcan but is slow to wake up, transferred with ICU RN/Rapid Response.

## 2021-07-10 NOTE — Progress Notes (Signed)
Date and time results received: 07/10/21 1450  Test: CO2   Critical Value: 65.9  Name of Provider Notified: Bonnielee Haff, Md  Actions Taken: Continue current plan

## 2021-07-10 NOTE — Significant Event (Signed)
Rapid Response Event Note   Reason for Call : Hypoxia and unresponsive   Initial Focused Assessment:  Rapid Response called to CT for SpO2 50s and patient unresponsive after receiving 1 mg Versed and 50 mcg Fentanyl. Prior to arrival Narcan and Romazicon administered by procedure RN. On arrival patient already off of CT table and back into floor bed sitting up with NRB. SpO2 in mid 90s and patient lethargic, but responding to questions. Additional  0.2 mg of Narcan administered per Kathlene Cote MD. Patient slightly more responsive and transferred back to 1431 by Rapid response.   Neuro-Lethargic, but following commands. Improved after Narcan administration Cardiac- ST 100s-110s.   Pulmonary- Diminished lower lungs and rhonchi in upper bilateral lungs. Noted to also be coughing up thick white sputum requiring suctioning. EtCO2 15-25. Improved after Narcan administration to 35-40.     Interventions:  0.2 mg of Narcan administered per Kathlene Cote MD ABG, Chest x-ray, BIPAP and transfer to SDU ordered by [name] MD    Plan of Care: Per [name] MD, patient to transfer to SDU for closer monitoring and BIPAP    Event Summary:   MD Notified: 1115 Call Time: [place] Time: 1245 End Time: [place], RN

## 2021-07-11 DIAGNOSIS — J9602 Acute respiratory failure with hypercapnia: Secondary | ICD-10-CM | POA: Diagnosis not present

## 2021-07-11 DIAGNOSIS — J9601 Acute respiratory failure with hypoxia: Secondary | ICD-10-CM

## 2021-07-11 DIAGNOSIS — D709 Neutropenia, unspecified: Secondary | ICD-10-CM | POA: Diagnosis not present

## 2021-07-11 DIAGNOSIS — F32A Depression, unspecified: Secondary | ICD-10-CM | POA: Diagnosis not present

## 2021-07-11 DIAGNOSIS — D61818 Other pancytopenia: Secondary | ICD-10-CM | POA: Diagnosis not present

## 2021-07-11 DIAGNOSIS — R5081 Fever presenting with conditions classified elsewhere: Secondary | ICD-10-CM | POA: Diagnosis not present

## 2021-07-11 LAB — CBC WITH DIFFERENTIAL/PLATELET
Abs Immature Granulocytes: 0.15 10*3/uL — ABNORMAL HIGH (ref 0.00–0.07)
Basophils Absolute: 0.1 10*3/uL (ref 0.0–0.1)
Basophils Relative: 1 %
Eosinophils Absolute: 0.3 10*3/uL (ref 0.0–0.5)
Eosinophils Relative: 6 %
HCT: 30.1 % — ABNORMAL LOW (ref 39.0–52.0)
Hemoglobin: 9.8 g/dL — ABNORMAL LOW (ref 13.0–17.0)
Immature Granulocytes: 3 %
Lymphocytes Relative: 29 %
Lymphs Abs: 1.4 10*3/uL (ref 0.7–4.0)
MCH: 38.1 pg — ABNORMAL HIGH (ref 26.0–34.0)
MCHC: 32.6 g/dL (ref 30.0–36.0)
MCV: 117.1 fL — ABNORMAL HIGH (ref 80.0–100.0)
Monocytes Absolute: 1.2 10*3/uL — ABNORMAL HIGH (ref 0.1–1.0)
Monocytes Relative: 24 %
Neutro Abs: 1.8 10*3/uL (ref 1.7–7.7)
Neutrophils Relative %: 37 %
Platelets: 337 10*3/uL (ref 150–400)
RBC: 2.57 MIL/uL — ABNORMAL LOW (ref 4.22–5.81)
RDW: 14 % (ref 11.5–15.5)
WBC: 4.9 10*3/uL (ref 4.0–10.5)
nRBC: 0.8 % — ABNORMAL HIGH (ref 0.0–0.2)

## 2021-07-11 LAB — BASIC METABOLIC PANEL
Anion gap: 7 (ref 5–15)
BUN: 14 mg/dL (ref 8–23)
CO2: 34 mmol/L — ABNORMAL HIGH (ref 22–32)
Calcium: 8.4 mg/dL — ABNORMAL LOW (ref 8.9–10.3)
Chloride: 95 mmol/L — ABNORMAL LOW (ref 98–111)
Creatinine, Ser: 0.6 mg/dL — ABNORMAL LOW (ref 0.61–1.24)
GFR, Estimated: >60 mL/min (ref >60)
Glucose, Bld: 81 mg/dL (ref 70–99)
Potassium: 3.9 mmol/L (ref 3.5–5.1)
Sodium: 136 mmol/L (ref 135–145)

## 2021-07-11 LAB — GLUCOSE, CAPILLARY
Glucose-Capillary: 70 mg/dL (ref 70–99)
Glucose-Capillary: 72 mg/dL (ref 70–99)
Glucose-Capillary: 72 mg/dL (ref 70–99)
Glucose-Capillary: 74 mg/dL (ref 70–99)
Glucose-Capillary: 77 mg/dL (ref 70–99)
Glucose-Capillary: 84 mg/dL (ref 70–99)

## 2021-07-11 MED ORDER — MAGIC MOUTHWASH W/LIDOCAINE
10.0000 mL | Freq: Four times a day (QID) | ORAL | Status: DC
  Administered 2021-07-11 – 2021-07-14 (×15): 10 mL via ORAL
  Filled 2021-07-11 (×19): qty 10

## 2021-07-11 MED ORDER — METOPROLOL TARTRATE 25 MG PO TABS
25.0000 mg | ORAL_TABLET | Freq: Once | ORAL | Status: AC
  Administered 2021-07-11: 25 mg via ORAL
  Filled 2021-07-11: qty 1

## 2021-07-11 MED ORDER — IPRATROPIUM-ALBUTEROL 0.5-2.5 (3) MG/3ML IN SOLN
3.0000 mL | Freq: Two times a day (BID) | RESPIRATORY_TRACT | Status: DC
  Administered 2021-07-11 – 2021-07-15 (×9): 3 mL via RESPIRATORY_TRACT
  Filled 2021-07-11 (×9): qty 3

## 2021-07-11 MED ORDER — METOPROLOL TARTRATE 50 MG PO TABS
50.0000 mg | ORAL_TABLET | Freq: Two times a day (BID) | ORAL | Status: DC
  Administered 2021-07-11 – 2021-07-14 (×7): 50 mg via ORAL
  Filled 2021-07-11 (×2): qty 2
  Filled 2021-07-11 (×5): qty 1

## 2021-07-11 NOTE — Progress Notes (Signed)
Progress Note:    [name]    HYW:[id number] DOB: 1941/05/08 DOA: 07/02/2021  PCP: Javier Glazier, MD    Brief Narrative:   80 year old male with extensive past medical history including rheumatoid arthritis, interstitial lung disease/pulmonary fibrosis, COPD, obstructive sleep apnea, chronic hypoxic respiratory failure on 4 L, hypertension, multifocal atrial tachycardia, chronic back pain, history of hardware associated vertebral osteomyelitis, history of splenectomy.  Presented to the emergency department with fever chills fatigue.  Was noted to be pancytopenic.  Found to have oral candidiasis.  Hospitalize for further management.    Plan was to do a bone marrow biopsy.  During the preparation for the bone marrow biopsy on 10/14 patient was given sedative agents with which he became hypoxic.  The procedure had to be canceled.  He was transferred to stepdown on BiPAP.    Subjective:   Patient noted to be on BiPAP this morning.  Fully awake.  Discussed with nursing staff.  BiPAP to be removed this morning.  Patient denies any chest pain shortness of breath.  No nausea vomiting.     Assessment and Plan:   Acute respiratory failure with hypoxia and hypercapnia Occurred when patient was given sedative medications prior to brown marrow biopsy on 10/14.  Was given Narcan and flumazenil.  Was also placed on BiPAP.  Seems to have done well overnight.  We will take him off of BiPAP this morning.   Chest x-ray did suggest opacity in the right base.  Likely aspirated.  However will not place him on new antibiotics.  He already remains on ceftriaxone and azithromycin.    Pancytopenia/neutropenic with fever: Patient was being seen by medical oncology/hematology.   Plan was for bone marrow biopsy which was attempted on 10/14 but had to be canceled due to respiratory distress.   Patient was noted to be febrile and was started on cefepime.  Cultures have all been negative.  Apart  from the community-acquired pneumonia no other source of infection has been found.  Antibiotics were narrowed down to ceftriaxone.  Also on azithromycin for community-acquired pneumonia as discussed below. Has not had any further episodes of fever.  No CBC has been done in the last 2 days.  Will order labs for today and daily.  When last checked his WBC was 2.6 and hemoglobin was 9.7.  Platelet counts had come normal.  Odynophagia/oral candidiasis Continue Diflucan.  HIV negative.  Hepatitis panel is unremarkable.  Case was discussed with gastroenterology who recommended continuing current management.  Symptoms are stable.  Continue to monitor.  Community-acquired pneumonia Stable.  Continue ceftriaxone and azithromycin.  We will plan for a 7-day course.  Chronic hypoxic respiratory failure Continue oxygen.  Uses 4L at baseline.  See above  History of pulmonary fibrosis, ILD Used to be on nintedanib.  But has not taken it in the last month or so.  Has been discontinued.  Irregular rhythm on telemetry/history of multifocal atrial tachycardia EKG shows sinus rhythm.  Has had episodes of tachycardia.  Metoprolol was resumed.  Heart rate seems to be reasonably well controlled.  Rheumatoid arthritis Last dose of methotrexate was 10/8.  Currently on hold.  Essential hypertension/hypokalemia/hyponatremia Soft blood pressures noted.  Antihypertensives on hold.  Potassium is normal today.  Sodium has been fluctuating.    Chronic pain Noted to be on tramadol, Cymbalta and gabapentin.    Depression Noted to be on Zoloft, Cymbalta, trazodone.    Dyslipidemia Continue Zocor  BPH Continue  Flomax  COPD Stable.  Continue Breo, Spiriva, as needed albuterol, CPAP nightly  Obesity Estimated body mass index is 35.12 kg/m as calculated from the following:   Height as of this encounter: _0  (1.702 m).   Weight as of this encounter: 101.7 kg.    Other information:    DVT prophylaxis:  Lovenox Code Status: DNR Family Communication: Updated patient and wife at bedside Disposition: Hopefully return home when improved.  PT and OT evaluation.  Home health was recommended when last seen.  Status is: Inpatient  Remains inpatient appropriate because:Ongoing diagnostic testing needed not appropriate for outpatient work up  Dispo: The patient is from: ILF              Anticipated d/c is to: ILF              Patient currently is not medically stable to d/c.   Difficult to place patient No      Consultants:   Oncology Interventional radiology    Objective:    Vitals:   07/11/21 0500 07/11/21 0511 07/11/21 0600 07/11/21 0803  BP: 126/75  127/64   Pulse: 100  93   Resp: (!) 26  (!) 30   Temp:  98.1 F (36.7 C)  98.7 F (37.1 C)  TempSrc:  Axillary  Axillary  SpO2: 100%  98%   Weight:      Height:        Intake/Output Summary (Last 24 hours) at 07/11/2021 0824 Last data filed at 07/11/2021 0100 Gross per 24 hour  Intake 348.51 ml  Output --  Net 348.51 ml    Filed Weights   07/19/2021 1141 07/07/2021 2009 07/10/21 1152  Weight: 100.2 kg 100.3 kg 101.7 kg       Physical Exam:   General appearance: Awake alert.  In no distress Resp: Normal effort at rest.  Diminished air entry at the bases.  Few crackles.  No wheezing or rhonchi. Cardio: S1-S2 is normal regular.  No S3-S4.  No rubs murmurs or bruit GI: Abdomen is soft.  Nontender nondistended.  Bowel sounds are present normal.  No masses organomegaly Extremities: No edema.  Full range of motion of lower extremities. Neurologic:   No focal neurological deficits.      Data Reviewed:    I have personally reviewed following labs and imaging studies  CBC: Recent Labs  Lab 07/18/2021 1209 07/06/21 0408 07/07/21 0334 07/08/21 0344 07/09/21 2336  WBC 1.5* 1.7* 1.2* 1.7* 2.6*  NEUTROABS 0.6* CRITICAL RESULT CALLED TO, READ BACK BY AND VERIFIED WITH: 0.1* 0.2* 0.7*  HGB 10.3* 10.5* 9.6* 9.4* 9.7*   HCT 30.1* 31.8* 28.9* 28.6* 29.8*  MCV 114.0* 114.8* 114.2* 116.7* 118.7*  PLT 33* 47* 92* 170 241     Basic Metabolic Panel: Recent Labs  Lab 07/06/21 0408 07/07/21 0334 07/08/21 0344 07/09/21 0329 07/10/21 0042 07/11/21 0253  NA 136 132* 134* 133* 133* 136  K 3.7 3.5 3.3* 4.0 3.8 3.9  CL 102 99 98 98 94* 95*  CO2 _1 33* 34*  GLUCOSE 100* 106* 96 126* 105* 81  BUN _2 CREATININE 0.70 0.61 0.62 0.61 0.72 0.60*  CALCIUM 8.3* 8.2* 8.4* 8.6* 8.5* 8.4*  MG 2.0  --   --  2.0  --   --      GFR: Estimated Creatinine Clearance: 85 mL/min (A) (by C-G formula based on SCr of 0.6 mg/dL (L)).  Liver Function Tests:  Recent Labs  Lab 07/04/2021 1054 07/06/21 0408 07/07/21 0334 07/08/21 0344  AST 44* 33 23 26  ALT 77* 65* 53* 49*  ALKPHOS 44 38 34* 36*  BILITOT 1.0 0.9 1.2 1.1  PROT 6.3* 5.4* 4.9* 5.1*  ALBUMIN 3.3* 2.9* 2.5* 2.4*      Recent Results (from the past 240 hour(s))  Culture, blood (Routine x 2)     Status: None   Collection Time: 07/21/2021 10:54 AM   Specimen: Right Antecubital; Blood  Result Value Ref Range Status   Specimen Description   Final    RIGHT ANTECUBITAL BLOOD Performed at [facility], [address] [place], [place] [address]    Special Requests   Final    Blood Culture adequate volume BOTTLES DRAWN AEROBIC AND ANAEROBIC Performed at [facility], [address] [place], [place] [address]    Culture   Final    NO GROWTH 5 DAYS Performed at [facility], [place] [address], [place], [place] [address]    Report Status 07/10/2021 FINAL  Final  Culture, blood (Routine x 2)     Status: None   Collection Time: 07/06/2021 11:20 AM   Specimen: Left Antecubital; Blood  Result Value Ref Range Status   Specimen Description   Final    LEFT ANTECUBITAL BLOOD Performed at [facility], [address] [place], [place] [address]    Special Requests   Final    Blood Culture adequate volume  BOTTLES DRAWN AEROBIC AND ANAEROBIC Performed at [facility], [address] [place], [place] [address]    Culture   Final    NO GROWTH 5 DAYS Performed at [facility], [place] [address], [place], [place] [address]    Report Status 07/10/2021 FINAL  Final  Resp Panel by RT-PCR (Flu A&B, Covid) Nasopharyngeal Swab     Status: None   Collection Time: 07/19/2021 11:42 AM   Specimen: Nasopharyngeal Swab; Nasopharyngeal(NP) swabs in vial transport medium  Result Value Ref Range Status   SARS Coronavirus 2 by RT PCR NEGATIVE NEGATIVE Final    Comment: (NOTE) SARS-CoV-2 target nucleic acids are NOT DETECTED.  The SARS-CoV-2 RNA is generally detectable in upper respiratory specimens during the acute phase of infection. The lowest concentration of SARS-CoV-2 viral copies this assay can detect is 138 copies/mL. A negative result does not preclude SARS-Cov-2 infection and should not be used as the sole basis for treatment or other patient management decisions. A negative result may occur with  improper specimen collection/handling, submission of specimen other than nasopharyngeal swab, presence of viral mutation(s) within the areas targeted by this assay, and inadequate number of viral copies(<138 copies/mL). A negative result must be combined with clinical observations, patient history, and epidemiological information. The expected result is Negative.  Fact Sheet for Patients:  EntrepreneurPulse.com.au  Fact Sheet for Healthcare Providers:  IncredibleEmployment.be  This test is no t yet approved or cleared by the [place] FDA and  has been authorized for detection and/or diagnosis of SARS-CoV-2 by FDA under an Emergency Use Authorization (EUA). This EUA will remain  in effect (meaning this test can be used) for the duration of the COVID-19 declaration under Section 564(b)(1) of the Act, 21 U.S.C.section 360bbb-3(b)(1), unless the  authorization is terminated  or revoked sooner.       Influenza A by PCR NEGATIVE NEGATIVE Final   Influenza B by PCR NEGATIVE NEGATIVE Final  Comment: (NOTE) The Xpert Xpress SARS-CoV-2/FLU/RSV plus assay is intended as an aid in the diagnosis of influenza from Nasopharyngeal swab specimens and should not be used as a sole basis for treatment. Nasal washings and aspirates are unacceptable for Xpert Xpress SARS-CoV-2/FLU/RSV testing.  Fact Sheet for Patients: EntrepreneurPulse.com.au  Fact Sheet for Healthcare Providers: IncredibleEmployment.be  This test is not yet approved or cleared by the [place] FDA and has been authorized for detection and/or diagnosis of SARS-CoV-2 by FDA under an Emergency Use Authorization (EUA). This EUA will remain in effect (meaning this test can be used) for the duration of the COVID-19 declaration under Section 564(b)(1) of the Act, 21 U.S.C. section 360bbb-3(b)(1), unless the authorization is terminated or revoked.  Performed at [facility], [address] [place], [place] [address]   Respiratory (~20 pathogens) panel by PCR     Status: None   Collection Time: 07/04/2021 12:09 PM   Specimen: Nasopharyngeal Swab; Respiratory  Result Value Ref Range Status   Adenovirus NOT DETECTED NOT DETECTED Final   Coronavirus 229E NOT DETECTED NOT DETECTED Final    Comment: (NOTE) The Coronavirus on the Respiratory Panel, DOES NOT test for the novel  Coronavirus (2019 nCoV)    Coronavirus HKU1 NOT DETECTED NOT DETECTED Final   Coronavirus NL63 NOT DETECTED NOT DETECTED Final   Coronavirus OC43 NOT DETECTED NOT DETECTED Final   Metapneumovirus NOT DETECTED NOT DETECTED Final   Rhinovirus / Enterovirus NOT DETECTED NOT DETECTED Final   Influenza A NOT DETECTED NOT DETECTED Final   Influenza B NOT DETECTED NOT DETECTED Final   Parainfluenza Virus 1 NOT DETECTED NOT DETECTED Final   Parainfluenza  Virus 2 NOT DETECTED NOT DETECTED Final   Parainfluenza Virus 3 NOT DETECTED NOT DETECTED Final   Parainfluenza Virus 4 NOT DETECTED NOT DETECTED Final   Respiratory Syncytial Virus NOT DETECTED NOT DETECTED Final   Bordetella pertussis NOT DETECTED NOT DETECTED Final   Bordetella Parapertussis NOT DETECTED NOT DETECTED Final   Chlamydophila pneumoniae NOT DETECTED NOT DETECTED Final   Mycoplasma pneumoniae NOT DETECTED NOT DETECTED Final    Comment: Performed at [facility], [place]. [address] [place], [place] [address]  Urine Culture     Status: None   Collection Time: 07/24/2021  7:04 PM   Specimen: In/Out Cath Urine  Result Value Ref Range Status   Specimen Description   Final    IN/OUT CATH URINE Performed at [facility] [address], [place], [place] [address]    Special Requests   Final    NONE Performed at [facility], [place] [address] [place], [place] [address]    Culture   Final    NO GROWTH Performed at [facility], [place] [address] [place], [place] [address]    Report Status 07/07/2021 FINAL  Final  MRSA Next Gen by PCR, Nasal     Status: None   Collection Time: 07/09/21 10:18 AM   Specimen: Nasal Mucosa; Nasal Swab  Result Value Ref Range Status   MRSA by PCR Next Gen NOT DETECTED NOT DETECTED Final    Comment: (NOTE) The GeneXpert MRSA Assay (FDA approved for NASAL specimens only), is one component of a comprehensive MRSA colonization surveillance program. It is not intended to diagnose MRSA infection nor to guide or monitor treatment for MRSA infections. Test performance is not FDA approved in patients less than 53 years old. Performed at [facility], [address] [place], [place] [address]           Radiology Studies:    DG CHEST PORT 1 VIEW  Result Date: 07/10/2021 CLINICAL DATA:  Low O2 sats EXAM: PORTABLE CHEST 1 VIEW COMPARISON:  Chest radiograph 07/06/2021 FINDINGS: The heart is  enlarged, unchanged. The mediastinal contours are stable. Patchy opacities are again seen in the bases. There are small bilateral pleural effusions. There is vascular congestion without definite overt pulmonary edema. There is no pneumothorax. The bones are stable. IMPRESSION: Changed patchy opacities in the lung bases and small bilateral pleural effusions. Electronically Signed   By: Valetta Mole M.D.   On: 07/10/2021 12:29        Medications:    Scheduled Meds:  chlorhexidine  15 mL Mouth Rinse BID   Chlorhexidine Gluconate Cloth  6 each Topical Daily   DULoxetine  60 mg Oral QPM   enoxaparin (LOVENOX) injection  40 mg Subcutaneous Q24H   finasteride  5 mg Oral QHS   fluticasone furoate-vilanterol  1 puff Inhalation Daily   folic acid  1 mg Oral Daily   gabapentin  600 mg Oral TID   magic mouthwash w/lidocaine  10 mL Oral QID   mouth rinse  15 mL Mouth Rinse q12n4p   metoprolol tartrate  25 mg Oral BID   sertraline  100 mg Oral Daily   simvastatin  40 mg Oral QHS   tamsulosin  0.4 mg Oral QHS   traZODone  50 mg Oral QHS   Continuous Infusions:  sodium chloride 10 mL/hr (07/07/21 1900)   azithromycin Stopped (07/10/21 2244)   cefTRIAXone (ROCEPHIN)  IV Stopped (07/10/21 1739)   fluconazole (DIFLUCAN) IV Stopped (07/11/21 0019)       LOS: 6 days    Bonnielee Haff, MD [facility]  07/11/2021, 8:24 AM

## 2021-07-11 NOTE — Progress Notes (Signed)
PT demonstrated hands on understanding of Flutter device. 

## 2021-07-11 NOTE — Progress Notes (Signed)
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  He had a respiratory event during the bone marrow biopsy.  The bone marrow biopsy was canceled.  He looks pretty good right now.  I think he is on BiPAP.  There is no CBC back.  His last CBC was back on 07/09/2021.  He did have some neutropenia.  The point of the bone marrow was to identify the source of the neutropenia.  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  Hopefully, the bone marrow biopsy will be done on Monday.  He was alert.  He communicated by writing.  I do not think that he has had any fevers..  He had a chest x-ray done yesterday.  This was appears unchanged.  He has some patchy opacities in the lung bases.  Again, I had believe that he has underlying myelodysplasia.  The degree of myelodysplasia and the prognostic category will be totally based upon the bone marrow biopsy results.  We will just have to await the bone marrow to be done.  If it is done Monday, I would not think any results would be available until probably Wednesday.  Pete , MD  Psalms 34:4   

## 2021-07-12 DIAGNOSIS — D61818 Other pancytopenia: Secondary | ICD-10-CM | POA: Diagnosis not present

## 2021-07-12 DIAGNOSIS — F32A Depression, unspecified: Secondary | ICD-10-CM | POA: Diagnosis not present

## 2021-07-12 DIAGNOSIS — J9602 Acute respiratory failure with hypercapnia: Secondary | ICD-10-CM | POA: Diagnosis not present

## 2021-07-12 DIAGNOSIS — J9601 Acute respiratory failure with hypoxia: Secondary | ICD-10-CM | POA: Diagnosis not present

## 2021-07-12 LAB — CBC WITH DIFFERENTIAL/PLATELET
Abs Immature Granulocytes: 0.2 10*3/uL — ABNORMAL HIGH (ref 0.00–0.07)
Basophils Absolute: 0.1 10*3/uL (ref 0.0–0.1)
Basophils Relative: 1 %
Eosinophils Absolute: 0.4 10*3/uL (ref 0.0–0.5)
Eosinophils Relative: 6 %
HCT: 30.8 % — ABNORMAL LOW (ref 39.0–52.0)
Hemoglobin: 9.7 g/dL — ABNORMAL LOW (ref 13.0–17.0)
Immature Granulocytes: 3 %
Lymphocytes Relative: 25 %
Lymphs Abs: 1.5 10*3/uL (ref 0.7–4.0)
MCH: 37.3 pg — ABNORMAL HIGH (ref 26.0–34.0)
MCHC: 31.5 g/dL (ref 30.0–36.0)
MCV: 118.5 fL — ABNORMAL HIGH (ref 80.0–100.0)
Monocytes Absolute: 1.4 10*3/uL — ABNORMAL HIGH (ref 0.1–1.0)
Monocytes Relative: 24 %
Neutro Abs: 2.4 10*3/uL (ref 1.7–7.7)
Neutrophils Relative %: 41 %
Platelets: 404 10*3/uL — ABNORMAL HIGH (ref 150–400)
RBC: 2.6 MIL/uL — ABNORMAL LOW (ref 4.22–5.81)
RDW: 14 % (ref 11.5–15.5)
WBC: 5.9 10*3/uL (ref 4.0–10.5)
nRBC: 0.5 % — ABNORMAL HIGH (ref 0.0–0.2)

## 2021-07-12 LAB — BASIC METABOLIC PANEL
Anion gap: 8 (ref 5–15)
BUN: 14 mg/dL (ref 8–23)
CO2: 33 mmol/L — ABNORMAL HIGH (ref 22–32)
Calcium: 8.2 mg/dL — ABNORMAL LOW (ref 8.9–10.3)
Chloride: 96 mmol/L — ABNORMAL LOW (ref 98–111)
Creatinine, Ser: 0.63 mg/dL (ref 0.61–1.24)
GFR, Estimated: >60 mL/min (ref >60)
Glucose, Bld: 67 mg/dL — ABNORMAL LOW (ref 70–99)
Potassium: 3.9 mmol/L (ref 3.5–5.1)
Sodium: 137 mmol/L (ref 135–145)

## 2021-07-12 LAB — GLUCOSE, CAPILLARY
Glucose-Capillary: 63 mg/dL — ABNORMAL LOW (ref 70–99)
Glucose-Capillary: 97 mg/dL (ref 70–99)
Glucose-Capillary: 81 mg/dL (ref 70–99)
Glucose-Capillary: 134 mg/dL — ABNORMAL HIGH (ref 70–99)
Glucose-Capillary: 121 mg/dL — ABNORMAL HIGH (ref 70–99)

## 2021-07-12 MED ORDER — DEXTROSE 50 % IV SOLN
12.5000 g | INTRAVENOUS | Status: AC
  Administered 2021-07-12: 12.5 g via INTRAVENOUS

## 2021-07-12 MED ORDER — AZITHROMYCIN 250 MG PO TABS
500.0000 mg | ORAL_TABLET | Freq: Every day | ORAL | Status: AC
  Administered 2021-07-12 – 2021-07-13 (×2): 500 mg via ORAL
  Filled 2021-07-12 (×2): qty 2

## 2021-07-12 MED ORDER — FLUCONAZOLE 100 MG PO TABS
200.0000 mg | ORAL_TABLET | Freq: Every day | ORAL | Status: DC
  Administered 2021-07-12 – 2021-07-14 (×3): 200 mg via ORAL
  Filled 2021-07-12 (×3): qty 2

## 2021-07-12 MED ORDER — DEXTROSE 50 % IV SOLN
INTRAVENOUS | Status: AC
  Administered 2021-07-12: 12.5 g via INTRAVENOUS
  Filled 2021-07-12: qty 50

## 2021-07-12 MED ORDER — MORPHINE SULFATE (PF) 2 MG/ML IV SOLN
2.0000 mg | Freq: Once | INTRAVENOUS | Status: AC
  Administered 2021-07-12: 2 mg via INTRAVENOUS
  Filled 2021-07-12: qty 1

## 2021-07-12 NOTE — Progress Notes (Signed)
Report called. Pt & wife notified of transfer. Pt brought up by bed. Pt stable at time of transfer.

## 2021-07-12 NOTE — Progress Notes (Signed)
Progress Note:    [name]    ONG:[id number] DOB: 12-21-1940 DOA: 07/01/2021  PCP: Javier Glazier, MD    Brief Narrative:   80 year old male with extensive past medical history including rheumatoid arthritis, interstitial lung disease/pulmonary fibrosis, COPD, obstructive sleep apnea, chronic hypoxic respiratory failure on 4 L, hypertension, multifocal atrial tachycardia, chronic back pain, history of hardware associated vertebral osteomyelitis, history of splenectomy.  Presented to the emergency department with fever chills fatigue.  Was noted to be pancytopenic.  Found to have oral candidiasis.  Hospitalize for further management.    Plan was to do a bone marrow biopsy.  During the preparation for the bone marrow biopsy on 10/14 patient was given sedative agents with which he became hypoxic.  The procedure had to be canceled.  He was transferred to stepdown on BiPAP.    Subjective:   Patient has not required BiPAP for the last 24 hours.  He mentions that his breathing status is good.  Occasional cough.  Has generalized body aches including back pain which is chronic for him.  Denies any nausea vomiting.  Mouth sores feel better.  Wife is at the bedside.  Patient noted to be on BiPAP this morning.  Fully awake.  Discussed with nursing staff.  BiPAP to be removed this morning.  Patient denies any chest pain shortness of breath.  No nausea vomiting.     Assessment and Plan:   Acute respiratory failure with hypoxia and hypercapnia This occurred when patient was given sedative medications prior to brown marrow biopsy on 10/14.  Was given Narcan and flumazenil.  Was also placed on BiPAP.  Chest x-ray did suggest opacity in the right base.  Possible he may have aspirated.  However he was already on ceftriaxone and azithromycin and so antibiotics were not changed. Has done well without BiPAP for the last 24 hours including overnight.  He did use a CPAP overnight.  He is on  oxygen chronically at home.  Okay for transfer back to floor today.    Pancytopenia/neutropenic with fever: Patient was being seen by medical oncology/hematology. Patient was noted to be febrile and was started on cefepime.  Cultures have all been negative.  Apart from the community-acquired pneumonia no other source of infection has been found.  Antibiotics were narrowed down to ceftriaxone.  Also on azithromycin for community-acquired pneumonia as discussed below. His WBC has improved.  Neutrophils are now normal.  His platelet counts are normal as well.   Plan was for bone marrow biopsy which was attempted on 10/14 but had to be canceled due to respiratory distress.  Will discuss with hematology tomorrow if bone marrow biopsy is still indicated considering improvement in his cell counts. Wonder if acute infection played a role in his pancytopenia.  Odynophagia/oral candidiasis Continue Diflucan.  HIV negative.  Hepatitis panel is unremarkable.  Case was discussed with gastroenterology who recommended continuing current management.  Symptoms are stable.  Continue to monitor.  Community-acquired pneumonia Stable.  Continue ceftriaxone and azithromycin.  We will plan for a 7-day course.  Discontinue after today.  Chronic hypoxic respiratory failure Continue oxygen.  Uses 4L at baseline.  See above  History of pulmonary fibrosis, ILD Used to be on nintedanib.  But has not taken it in the last month or so.  Has been discontinued.  Irregular rhythm on telemetry/history of multifocal atrial tachycardia EKG showed sinus rhythm.  Has had numerous episodes of intermittent tachycardia.  Metoprolol dose was  increased.  Heart rate seems to be better controlled now.  Continue to monitor on telemetry.    Urinary retention with history of BPH Has required in and out catheterization over the last 24 hours.  He is on Flomax and Proscar for his BPH.  Immobility is contributing to his retention.  Discussed  with nursing staff.  We will try to mobilize him today.  Hopefully can avoid placing indwelling Foley catheter.    Rheumatoid arthritis Last dose of methotrexate was 10/8.  Currently on hold.  Essential hypertension/hypokalemia/hyponatremia Antihypertensives currently on hold.  Occasional high blood pressure readings noted.  Potassium is better.  Sodium is normal today.    Hypoglycemia Hypoglycemia episode this morning likely because he is NPO.  He can be placed on a diet this morning.  Discussed with nursing.  Chronic pain Noted to be on tramadol, Cymbalta and gabapentin.    Depression Noted to be on Zoloft, Cymbalta, trazodone.    Dyslipidemia Continue Zocor  COPD Stable.  Continue Breo, Spiriva, as needed albuterol, CPAP nightly  Obesity Estimated body mass index is 35.12 kg/m as calculated from the following:   Height as of this encounter: _0  (1.702 m).   Weight as of this encounter: 101.7 kg.    Other information:    DVT prophylaxis: Lovenox Code Status: DNR Family Communication: Patient and wife updated at bedside. Disposition: Hopefully return home when improved.  PT and OT evaluation.  Home health was recommended when last seen.  Status is: Inpatient  Remains inpatient appropriate because:Ongoing diagnostic testing needed not appropriate for outpatient work up  Dispo: The patient is from: ILF              Anticipated d/c is to: ILF              Patient currently is not medically stable to d/c.   Difficult to place patient No      Consultants:   Oncology Interventional radiology    Objective:    Vitals:   07/12/21 0500 07/12/21 0600 07/12/21 0700 07/12/21 0744  BP: (!) 153/78 138/76 (!) 153/79   Pulse: 86 83 82   Resp: (!) 24 (!) 33 (!) 31   Temp:    98.2 F (36.8 C)  TempSrc:    Oral  SpO2: 93% 91% 92%   Weight:      Height:        Intake/Output Summary (Last 24 hours) at 07/12/2021 0830 Last data filed at 07/11/2021 2250 Gross  per 24 hour  Intake --  Output 1800 ml  Net -1800 ml    Filed Weights   07/08/2021 1141 07/18/2021 2009 07/10/21 1152  Weight: 100.2 kg 100.3 kg 101.7 kg       Physical Exam:   General appearance: Awake alert.  In no distress Oral mucosal ulcers show improvement. Resp: Clear bilaterally.  Normal effort at rest.  Few crackles at the bases.  No wheezing or rhonchi.   Cardio: S1-S2 is normal regular.  No S3-S4.  No rubs murmurs or bruit GI: Abdomen is soft.  Nontender nondistended.  Bowel sounds are present normal.  No masses organomegaly Extremities: No edema.  Full range of motion of lower extremities. Neurologic: Alert and oriented x3.  No focal neurological deficits.       Data Reviewed:    I have personally reviewed following labs and imaging studies  CBC: Recent Labs  Lab 07/07/21 0334 07/08/21 0344 07/09/21 2336 07/11/21 0755 07/12/21 0239  WBC 1.2*  1.7* 2.6* 4.9 5.9  NEUTROABS 0.1* 0.2* 0.7* 1.8 2.4  HGB 9.6* 9.4* 9.7* 9.8* 9.7*  HCT 28.9* 28.6* 29.8* 30.1* 30.8*  MCV 114.2* 116.7* 118.7* 117.1* 118.5*  PLT 92* 170 241 337 404*     Basic Metabolic Panel: Recent Labs  Lab 07/06/21 0408 07/07/21 0334 07/08/21 0344 07/09/21 0329 07/10/21 0042 07/11/21 0253 07/12/21 0239  NA 136   < > 134* 133* 133* 136 137  K 3.7   < > 3.3* 4.0 3.8 3.9 3.9  CL 102   < > 98 98 94* 95* 96*  CO2 29   < > 30 28 33* 34* 33*  GLUCOSE 100*   < > 96 126* 105* 81 67*  BUN 15   < > _0 CREATININE 0.70   < > 0.62 0.61 0.72 0.60* 0.63  CALCIUM 8.3*   < > 8.4* 8.6* 8.5* 8.4* 8.2*  MG 2.0  --   --  2.0  --   --   --    < > = values in this interval not displayed.     GFR: Estimated Creatinine Clearance: 85 mL/min (by C-G formula based on SCr of 0.63 mg/dL).  Liver Function Tests: Recent Labs  Lab 07/24/2021 1054 07/06/21 0408 07/07/21 0334 07/08/21 0344  AST 44* 33 23 26  ALT 77* 65* 53* 49*  ALKPHOS 44 38 34* 36*  BILITOT 1.0 0.9 1.2 1.1  PROT 6.3*  5.4* 4.9* 5.1*  ALBUMIN 3.3* 2.9* 2.5* 2.4*      Recent Results (from the past 240 hour(s))  Culture, blood (Routine x 2)     Status: None   Collection Time: 07/04/2021 10:54 AM   Specimen: Right Antecubital; Blood  Result Value Ref Range Status   Specimen Description   Final    RIGHT ANTECUBITAL BLOOD Performed at [facility], [place] [place], [place] [address]    Special Requests   Final    Blood Culture adequate volume BOTTLES DRAWN AEROBIC AND ANAEROBIC Performed at [facility], [address] [place], [place] [address]    Culture   Final    NO GROWTH 5 DAYS Performed at [facility], [place] [address] [place], [place] [address]    Report Status 07/10/2021 FINAL  Final  Culture, blood (Routine x 2)     Status: None   Collection Time: 07/13/2021 11:20 AM   Specimen: Left Antecubital; Blood  Result Value Ref Range Status   Specimen Description   Final    LEFT ANTECUBITAL BLOOD Performed at [facility], [address] [place], [place] [address]    Special Requests   Final    Blood Culture adequate volume BOTTLES DRAWN AEROBIC AND ANAEROBIC Performed at [facility], [address] [place], [place] [address]    Culture   Final    NO GROWTH 5 DAYS Performed at [facility], [place] [address] [place], [place] [address]    Report Status 07/10/2021 FINAL  Final  Resp Panel by RT-PCR (Flu A&B, Covid) Nasopharyngeal Swab     Status: None   Collection Time: 07/03/2021 11:42 AM   Specimen: Nasopharyngeal Swab; Nasopharyngeal(NP) swabs in vial transport medium  Result Value Ref Range Status   SARS Coronavirus 2 by RT PCR NEGATIVE NEGATIVE Final    Comment: (NOTE) SARS-CoV-2 target nucleic acids are NOT DETECTED.  The SARS-CoV-2 RNA is generally detectable  in upper respiratory specimens during the acute phase of infection. The lowest concentration of SARS-CoV-2 viral copies this assay can detect is 138 copies/mL. A  negative result does not preclude SARS-Cov-2 infection and should not be used as the sole basis for treatment or other patient management decisions. A negative result may occur with  improper specimen collection/handling, submission of specimen other than nasopharyngeal swab, presence of viral mutation(s) within the areas targeted by this assay, and inadequate number of viral copies(<138 copies/mL). A negative result must be combined with clinical observations, patient history, and epidemiological information. The expected result is Negative.  Fact Sheet for Patients:  EntrepreneurPulse.com.au  Fact Sheet for Healthcare Providers:  IncredibleEmployment.be  This test is no t yet approved or cleared by the [place] FDA and  has been authorized for detection and/or diagnosis of SARS-CoV-2 by FDA under an Emergency Use Authorization (EUA). This EUA will remain  in effect (meaning this test can be used) for the duration of the COVID-19 declaration under Section 564(b)(1) of the Act, 21 U.S.C.section 360bbb-3(b)(1), unless the authorization is terminated  or revoked sooner.       Influenza A by PCR NEGATIVE NEGATIVE Final   Influenza B by PCR NEGATIVE NEGATIVE Final    Comment: (NOTE) The Xpert Xpress SARS-CoV-2/FLU/RSV plus assay is intended as an aid in the diagnosis of influenza from Nasopharyngeal swab specimens and should not be used as a sole basis for treatment. Nasal washings and aspirates are unacceptable for Xpert Xpress SARS-CoV-2/FLU/RSV testing.  Fact Sheet for Patients: EntrepreneurPulse.com.au  Fact Sheet for Healthcare Providers: IncredibleEmployment.be  This test is not yet approved or cleared by the [place] FDA and has been authorized for detection and/or diagnosis of SARS-CoV-2 by FDA under an Emergency Use Authorization (EUA). This EUA will remain in effect (meaning this test can  be used) for the duration of the COVID-19 declaration under Section 564(b)(1) of the Act, 21 U.S.C. section 360bbb-3(b)(1), unless the authorization is terminated or revoked.  Performed at [facility], [address] [place], [place] [address]   Respiratory (~20 pathogens) panel by PCR     Status: None   Collection Time: 07/27/2021 12:09 PM   Specimen: Nasopharyngeal Swab; Respiratory  Result Value Ref Range Status   Adenovirus NOT DETECTED NOT DETECTED Final   Coronavirus 229E NOT DETECTED NOT DETECTED Final    Comment: (NOTE) The Coronavirus on the Respiratory Panel, DOES NOT test for the novel  Coronavirus (2019 nCoV)    Coronavirus HKU1 NOT DETECTED NOT DETECTED Final   Coronavirus NL63 NOT DETECTED NOT DETECTED Final   Coronavirus OC43 NOT DETECTED NOT DETECTED Final   Metapneumovirus NOT DETECTED NOT DETECTED Final   Rhinovirus / Enterovirus NOT DETECTED NOT DETECTED Final   Influenza A NOT DETECTED NOT DETECTED Final   Influenza B NOT DETECTED NOT DETECTED Final   Parainfluenza Virus 1 NOT DETECTED NOT DETECTED Final   Parainfluenza Virus 2 NOT DETECTED NOT DETECTED Final   Parainfluenza Virus 3 NOT DETECTED NOT DETECTED Final   Parainfluenza Virus 4 NOT DETECTED NOT DETECTED Final   Respiratory Syncytial Virus NOT DETECTED NOT DETECTED Final   Bordetella pertussis NOT DETECTED NOT DETECTED Final   Bordetella Parapertussis NOT DETECTED NOT DETECTED Final   Chlamydophila pneumoniae NOT DETECTED NOT DETECTED Final   Mycoplasma pneumoniae NOT DETECTED NOT DETECTED Final    Comment: Performed at [facility], [place]. [address] [place], [place] [address]  Urine Culture  Status: None   Collection Time: 06/28/2021  7:04 PM   Specimen: In/Out Cath Urine  Result Value Ref Range Status   Specimen Description   Final    IN/OUT CATH URINE Performed at [facility], [place] [address], [place], [place] [address]    Special Requests   Final     NONE Performed at [facility], [place] [address] [place], [place] [address]    Culture   Final    NO GROWTH Performed at [facility], [place] [address], [place], [place] [address]    Report Status 07/07/2021 FINAL  Final  MRSA Next Gen by PCR, Nasal     Status: None   Collection Time: 07/09/21 10:18 AM   Specimen: Nasal Mucosa; Nasal Swab  Result Value Ref Range Status   MRSA by PCR Next Gen NOT DETECTED NOT DETECTED Final    Comment: (NOTE) The GeneXpert MRSA Assay (FDA approved for NASAL specimens only), is one component of a comprehensive MRSA colonization surveillance program. It is not intended to diagnose MRSA infection nor to guide or monitor treatment for MRSA infections. Test performance is not FDA approved in patients less than 52 years old. Performed at [facility], [place] [address], [place], [place] [address]           Radiology Studies:    DG CHEST PORT 1 VIEW  Result Date: 07/10/2021 CLINICAL DATA:  Low O2 sats EXAM: PORTABLE CHEST 1 VIEW COMPARISON:  Chest radiograph 07/06/2021 FINDINGS: The heart is enlarged, unchanged. The mediastinal contours are stable. Patchy opacities are again seen in the bases. There are small bilateral pleural effusions. There is vascular congestion without definite overt pulmonary edema. There is no pneumothorax. The bones are stable. IMPRESSION: Changed patchy opacities in the lung bases and small bilateral pleural effusions. Electronically Signed   By: Valetta Mole M.D.   On: 07/10/2021 12:29        Medications:    Scheduled Meds:  chlorhexidine  15 mL Mouth Rinse BID   Chlorhexidine Gluconate Cloth  6 each Topical Daily   DULoxetine  60 mg Oral QPM   enoxaparin (LOVENOX) injection  40 mg Subcutaneous Q24H   finasteride  5 mg Oral QHS   fluticasone furoate-vilanterol  1 puff Inhalation Daily   folic acid  1 mg Oral Daily   gabapentin  600 mg Oral TID   ipratropium-albuterol  3 mL Nebulization  BID   magic mouthwash w/lidocaine  10 mL Oral QID   mouth rinse  15 mL Mouth Rinse q12n4p   metoprolol tartrate  50 mg Oral BID   sertraline  100 mg Oral Daily   simvastatin  40 mg Oral QHS   tamsulosin  0.4 mg Oral QHS   traZODone  50 mg Oral QHS   Continuous Infusions:  sodium chloride 10 mL/hr (07/07/21 1900)   azithromycin Stopped (07/11/21 2227)   cefTRIAXone (ROCEPHIN)  IV Stopped (07/11/21 1943)   fluconazole (DIFLUCAN) IV Stopped (07/11/21 2323)       LOS: 7 days    Bonnielee Haff, MD [facility]  07/12/2021, 8:30 AM

## 2021-07-12 NOTE — Progress Notes (Signed)
End of shift:  Patient in bed with wife (cherry) at bedside. Patient satting in mid 90's on 4 liters nasal cannula. Telemetry monitor on and Heart rate stable in the 90's throughout shift. Patient voided a total of three times over shift. Was up to chair with two assist prior to getting back to bed.

## 2021-07-12 NOTE — Progress Notes (Signed)
PHARMACIST - PHYSICIAN COMMUNICATION  CONCERNING: Antibiotic IV to Oral Route Change Policy  RECOMMENDATION: This patient is receiving fluconazole & azithromycin by the intravenous route.  Based on criteria approved by the Pharmacy and Therapeutics Committee, the antibiotic(s) is/are being converted to the equivalent oral dose form(s).   DESCRIPTION: These criteria include: Patient being treated for a respiratory tract infection, urinary tract infection, cellulitis or clostridium difficile associated diarrhea if on metronidazole The patient is not neutropenic and does not exhibit a GI malabsorption state The patient is eating (either orally or via tube) and/or has been taking other orally administered medications for a least 24 hours The patient is improving clinically and has a Tmax < 100.5  If you have questions about this conversion, please contact the Pharmacy Department  _0   [phone] )  [facility] _1   [phone] )  [facility] _2   [phone] )  [facility] _3   [phone] )  [facility] _4   [phone] )  [facility]

## 2021-07-12 NOTE — Progress Notes (Signed)
Received patient into 1403. Placed on telemetry monitor. Skin check done. Patient washed and vital signs taken. Vital signs within normal parameters. Marcelline Mates (wife) at bedside. Patient up to chair via assist with walker and Nurse Tech. Pt was able to void 350 ml's after Bladder scan revealed 539 mL's. Will continue to monitor.

## 2021-07-13 ENCOUNTER — Inpatient Hospital Stay (HOSPITAL_COMMUNITY): Payer: Medicare PPO

## 2021-07-13 DIAGNOSIS — F32A Depression, unspecified: Secondary | ICD-10-CM | POA: Diagnosis not present

## 2021-07-13 DIAGNOSIS — J9601 Acute respiratory failure with hypoxia: Secondary | ICD-10-CM | POA: Diagnosis not present

## 2021-07-13 DIAGNOSIS — D61818 Other pancytopenia: Secondary | ICD-10-CM | POA: Diagnosis not present

## 2021-07-13 DIAGNOSIS — J9602 Acute respiratory failure with hypercapnia: Secondary | ICD-10-CM | POA: Diagnosis not present

## 2021-07-13 LAB — CBC WITH DIFFERENTIAL/PLATELET
Abs Immature Granulocytes: 0.26 10*3/uL — ABNORMAL HIGH (ref 0.00–0.07)
Basophils Absolute: 0.1 10*3/uL (ref 0.0–0.1)
Basophils Relative: 1 %
Eosinophils Absolute: 0.5 10*3/uL (ref 0.0–0.5)
Eosinophils Relative: 7 %
HCT: 31.8 % — ABNORMAL LOW (ref 39.0–52.0)
Hemoglobin: 10.3 g/dL — ABNORMAL LOW (ref 13.0–17.0)
Immature Granulocytes: 4 %
Lymphocytes Relative: 26 %
Lymphs Abs: 1.9 10*3/uL (ref 0.7–4.0)
MCH: 38.6 pg — ABNORMAL HIGH (ref 26.0–34.0)
MCHC: 32.4 g/dL (ref 30.0–36.0)
MCV: 119.1 fL — ABNORMAL HIGH (ref 80.0–100.0)
Monocytes Absolute: 1.9 10*3/uL — ABNORMAL HIGH (ref 0.1–1.0)
Monocytes Relative: 26 %
Neutro Abs: 2.7 10*3/uL (ref 1.7–7.7)
Neutrophils Relative %: 36 %
Platelets: 457 10*3/uL — ABNORMAL HIGH (ref 150–400)
RBC: 2.67 MIL/uL — ABNORMAL LOW (ref 4.22–5.81)
RDW: 14.2 % (ref 11.5–15.5)
WBC: 7.4 10*3/uL (ref 4.0–10.5)
nRBC: 0.4 % — ABNORMAL HIGH (ref 0.0–0.2)

## 2021-07-13 LAB — GLUCOSE, CAPILLARY
Glucose-Capillary: 121 mg/dL — ABNORMAL HIGH (ref 70–99)
Glucose-Capillary: 102 mg/dL — ABNORMAL HIGH (ref 70–99)
Glucose-Capillary: 111 mg/dL — ABNORMAL HIGH (ref 70–99)
Glucose-Capillary: 164 mg/dL — ABNORMAL HIGH (ref 70–99)

## 2021-07-13 LAB — BASIC METABOLIC PANEL
Anion gap: 7 (ref 5–15)
BUN: 11 mg/dL (ref 8–23)
CO2: 33 mmol/L — ABNORMAL HIGH (ref 22–32)
Calcium: 8.3 mg/dL — ABNORMAL LOW (ref 8.9–10.3)
Chloride: 96 mmol/L — ABNORMAL LOW (ref 98–111)
Creatinine, Ser: 0.58 mg/dL — ABNORMAL LOW (ref 0.61–1.24)
GFR, Estimated: >60 mL/min (ref >60)
Glucose, Bld: 100 mg/dL — ABNORMAL HIGH (ref 70–99)
Potassium: 3.4 mmol/L — ABNORMAL LOW (ref 3.5–5.1)
Sodium: 136 mmol/L (ref 135–145)

## 2021-07-13 LAB — MAGNESIUM: Magnesium: 2 mg/dL (ref 1.7–2.4)

## 2021-07-13 MED ORDER — POTASSIUM CHLORIDE CRYS ER 20 MEQ PO TBCR
40.0000 meq | EXTENDED_RELEASE_TABLET | Freq: Once | ORAL | Status: AC
  Administered 2021-07-13: 40 meq via ORAL
  Filled 2021-07-13: qty 2

## 2021-07-13 NOTE — Progress Notes (Signed)
Progress Note:    [name]    GUY:[id number] DOB: [birth year] DOA: 07/14/2021  PCP: Javier Glazier, MD    Brief Narrative:   80 year old male with extensive past medical history including rheumatoid arthritis, interstitial lung disease/pulmonary fibrosis, COPD, obstructive sleep apnea, chronic hypoxic respiratory failure on 4 L, hypertension, multifocal atrial tachycardia, chronic back pain, history of hardware associated vertebral osteomyelitis, history of splenectomy.  Presented to the emergency department with fever chills fatigue.  Was noted to be pancytopenic.  Found to have oral candidiasis.  Hospitalize for further management.    Plan was to do a bone marrow biopsy.  During the preparation for the bone marrow biopsy on 10/14 patient was given sedative agents with which he became hypoxic.  The procedure had to be canceled.  He was transferred to stepdown on BiPAP.  Subsequently stabilized and then transferred back to the floor.    Subjective:   Patient mentions slightly worsening cough this morning.  Denies any chest pain.  Has as usual body aches and headaches.  Wife is at the bedside.       Assessment and Plan:   Acute respiratory failure with hypoxia and hypercapnia This occurred when patient was given sedative medications prior to brown marrow biopsy on 10/14.  Was given Narcan and flumazenil.  Was also placed on BiPAP.  Chest x-ray did suggest opacity in the right base.  Possible he may have aspirated.  However he was already on ceftriaxone and azithromycin and so antibiotics were not changed. Patient has been doing well from a respiratory standpoint.  Has noticed worsening cough this morning.  Also had an episode of hypoxia overnight and his oxygen has to be increased to 6 L/min.  We will check a chest x-ray this morning.  He has completed course of antibiotics. He is on oxygen chronically at home at 4 L/min.  Pancytopenia/neutropenic with fever: Patient was  being seen by medical oncology/hematology. Patient was noted to be febrile and was started on cefepime.  Cultures have all been negative.  Apart from the community-acquired pneumonia no other source of infection has been found.  7-day course of ceftriaxone and azithromycin.  His WBC has improved in the interim.  Neutrophils are now normal.  Platelet counts are normal.  Bone marrow biopsy was attempted on 10/14 but had to be canceled since patient became hypoxic after getting sedative medications. Discussed with Dr. Marin Olp with hematology today.  In view of his counts now becoming completely normal he can hold off on bone marrow biopsy.  Wonder if acute infection played a role in his pancytopenia.    Odynophagia/oral candidiasis Continue Diflucan.  HIV negative.  Hepatitis panel is unremarkable.  Case was discussed with gastroenterology who recommended continuing current management.  Seems to be doing better.  Community-acquired pneumonia He has completed course of ceftriaxone and azithromycin.  Experiencing more cough this morning.  Will check chest x-ray.    Chronic hypoxic respiratory failure Continue oxygen.  Uses 4L at baseline.  See above  History of pulmonary fibrosis, ILD Used to be on nintedanib.  But has not taken it in the last month or so.  Has been discontinued.  Irregular rhythm on telemetry/history of multifocal atrial tachycardia EKG showed sinus rhythm.  Has had numerous episodes of intermittent tachycardia.  Metoprolol dose was increased.  Heart rate seems to be better controlled now.  Continue to monitor on telemetry.    Urinary retention with history of BPH Required in  and out catheterization when he was in the stepdown unit.  He remains on Flomax and Proscar.  Was able to void yesterday after being mobilized.  Hopefully we can avoid placing Foley catheter.    Rheumatoid arthritis Last dose of methotrexate was 10/8.  Currently on hold.  Essential  hypertension/hypokalemia/hyponatremia Antihypertensives were placed on hold due to borderline low blood pressures.  Blood pressures have been creeping up for the past few days.  He is back on metoprolol.  We will look at his other medications as well.   Replace potassium.  Sodium is normal.    Hypoglycemia Had hypoglycemia on 10/16.  This was likely due to poor oral intake.  Diet was reinitiated.  CBGs are now normal.    Chronic pain Noted to be on tramadol, Cymbalta and gabapentin.    Depression Noted to be on Zoloft, Cymbalta, trazodone.    Dyslipidemia Continue Zocor  COPD Stable.  Continue Breo, Spiriva, as needed albuterol, CPAP nightly  Obesity Estimated body mass index is 35.12 kg/m as calculated from the following:   Height as of this encounter: _0  (1.702 m).   Weight as of this encounter: 101.7 kg.    Other information:    DVT prophylaxis: Lovenox Code Status: DNR Family Communication: Patient and wife updated at bedside. Disposition: Hopefully return home when improved.  PT and OT evaluation.  Home health was recommended when last seen.  Status is: Inpatient  Remains inpatient appropriate because:Ongoing diagnostic testing needed not appropriate for outpatient work up  Dispo: The patient is from: ILF              Anticipated d/c is to: ILF              Patient currently is not medically stable to d/c.   Difficult to place patient No      Consultants:   Oncology Interventional radiology    Objective:    Vitals:   07/13/21 0009 07/13/21 0417 07/13/21 0756 07/13/21 0821  BP: (!) 141/69 (!) 151/81  135/82  Pulse: 78 87  97  Resp: 20 (!) 23    Temp: 98.1 F (36.7 C) 99 F (37.2 C)    TempSrc:      SpO2: 94% 97% 98%   Weight:      Height:        Intake/Output Summary (Last 24 hours) at 07/13/2021 0911 Last data filed at 07/13/2021 0900 Gross per 24 hour  Intake 440 ml  Output 1125 ml  Net -685 ml    Filed Weights   07/13/2021 1141  07/24/2021 2009 07/10/21 1152  Weight: 100.2 kg 100.3 kg 101.7 kg       Physical Exam:   General appearance: Awake alert.  In no distress Resp: Occasional wheezing heard in the left lung but mostly clear to auscultation anteriorly.  Mildly increased effort noted.  No use of accessory muscles. Cardio: S1-S2 is normal regular.  No S3-S4.  No rubs murmurs or bruit GI: Abdomen is soft.  Nontender nondistended.  Bowel sounds are present normal.  No masses organomegaly Extremities: No edema.  Moving all of his extremities Neurologic: Alert and oriented x3.  No focal neurological deficits.       Data Reviewed:    I have personally reviewed following labs and imaging studies  CBC: Recent Labs  Lab 07/08/21 0344 07/09/21 2336 07/11/21 0755 07/12/21 0239 07/13/21 0455  WBC 1.7* 2.6* 4.9 5.9 7.4  NEUTROABS 0.2* 0.7* 1.8 2.4 2.7  HGB  9.4* 9.7* 9.8* 9.7* 10.3*  HCT 28.6* 29.8* 30.1* 30.8* 31.8*  MCV 116.7* 118.7* 117.1* 118.5* 119.1*  PLT 170 241 337 404* 457*     Basic Metabolic Panel: Recent Labs  Lab 07/09/21 0329 07/10/21 0042 07/11/21 0253 07/12/21 0239 07/13/21 0455  NA 133* 133* 136 137 136  K 4.0 3.8 3.9 3.9 3.4*  CL 98 94* 95* 96* 96*  CO2 28 33* 34* 33* 33*  GLUCOSE 126* 105* 81 67* 100*  BUN _0 CREATININE 0.61 0.72 0.60* 0.63 0.58*  CALCIUM 8.6* 8.5* 8.4* 8.2* 8.3*  MG 2.0  --   --   --  2.0     GFR: Estimated Creatinine Clearance: 85 mL/min (A) (by C-G formula based on SCr of 0.58 mg/dL (L)).  Liver Function Tests: Recent Labs  Lab 07/07/21 0334 07/08/21 0344  AST 23 26  ALT 53* 49*  ALKPHOS 34* 36*  BILITOT 1.2 1.1  PROT 4.9* 5.1*  ALBUMIN 2.5* 2.4*      Recent Results (from the past 240 hour(s))  Culture, blood (Routine x 2)     Status: None   Collection Time: 07/03/2021 10:54 AM   Specimen: Right Antecubital; Blood  Result Value Ref Range Status   Specimen Description   Final    RIGHT ANTECUBITAL BLOOD Performed at [facility], [address] [place], [place] [address]    Special Requests   Final    Blood Culture adequate volume BOTTLES DRAWN AEROBIC AND ANAEROBIC Performed at [facility], [address], [place], [place] [address]    Culture   Final    NO GROWTH 5 DAYS Performed at [facility], [place] [address] [place], [place] [address]    Report Status 07/10/2021 FINAL  Final  Culture, blood (Routine x 2)     Status: None   Collection Time: 06/28/2021 11:20 AM   Specimen: Left Antecubital; Blood  Result Value Ref Range Status   Specimen Description   Final    LEFT ANTECUBITAL BLOOD Performed at [facility], [address] [place], [place] [address]    Special Requests   Final    Blood Culture adequate volume BOTTLES DRAWN AEROBIC AND ANAEROBIC Performed at [facility], [address] [place], [place] [address]    Culture   Final    NO GROWTH 5 DAYS Performed at [facility], [place] [address], [place], [place] [address]    Report Status 07/10/2021 FINAL  Final  Resp Panel by RT-PCR (Flu A&B, Covid) Nasopharyngeal Swab     Status: None   Collection Time: 07/08/2021 11:42 AM   Specimen: Nasopharyngeal Swab; Nasopharyngeal(NP) swabs in vial transport medium  Result Value Ref Range Status   SARS Coronavirus 2 by RT PCR NEGATIVE NEGATIVE Final    Comment: (NOTE) SARS-CoV-2 target nucleic acids are NOT DETECTED.  The SARS-CoV-2 RNA is generally detectable in upper respiratory specimens during the acute phase of infection. The lowest concentration of SARS-CoV-2 viral copies this assay can detect is 138 copies/mL. A negative result does not preclude SARS-Cov-2 infection and should not be used as the sole basis for treatment or other patient management decisions. A negative result may occur with  improper specimen collection/handling, submission of specimen other than nasopharyngeal swab, presence of viral mutation(s) within the areas  targeted by this assay, and inadequate number of viral copies(<138 copies/mL). A negative result must  be combined with clinical observations, patient history, and epidemiological information. The expected result is Negative.  Fact Sheet for Patients:  EntrepreneurPulse.com.au  Fact Sheet for Healthcare Providers:  IncredibleEmployment.be  This test is no t yet approved or cleared by the [place] FDA and  has been authorized for detection and/or diagnosis of SARS-CoV-2 by FDA under an Emergency Use Authorization (EUA). This EUA will remain  in effect (meaning this test can be used) for the duration of the COVID-19 declaration under Section 564(b)(1) of the Act, 21 U.S.C.section 360bbb-3(b)(1), unless the authorization is terminated  or revoked sooner.       Influenza A by PCR NEGATIVE NEGATIVE Final   Influenza B by PCR NEGATIVE NEGATIVE Final    Comment: (NOTE) The Xpert Xpress SARS-CoV-2/FLU/RSV plus assay is intended as an aid in the diagnosis of influenza from Nasopharyngeal swab specimens and should not be used as a sole basis for treatment. Nasal washings and aspirates are unacceptable for Xpert Xpress SARS-CoV-2/FLU/RSV testing.  Fact Sheet for Patients: EntrepreneurPulse.com.au  Fact Sheet for Healthcare Providers: IncredibleEmployment.be  This test is not yet approved or cleared by the [place] FDA and has been authorized for detection and/or diagnosis of SARS-CoV-2 by FDA under an Emergency Use Authorization (EUA). This EUA will remain in effect (meaning this test can be used) for the duration of the COVID-19 declaration under Section 564(b)(1) of the Act, 21 U.S.C. section 360bbb-3(b)(1), unless the authorization is terminated or revoked.  Performed at [facility], [address] [place], [place] [address]   Respiratory (~20 pathogens) panel by PCR     Status: None    Collection Time: 07/02/2021 12:09 PM   Specimen: Nasopharyngeal Swab; Respiratory  Result Value Ref Range Status   Adenovirus NOT DETECTED NOT DETECTED Final   Coronavirus 229E NOT DETECTED NOT DETECTED Final    Comment: (NOTE) The Coronavirus on the Respiratory Panel, DOES NOT test for the novel  Coronavirus (2019 nCoV)    Coronavirus HKU1 NOT DETECTED NOT DETECTED Final   Coronavirus NL63 NOT DETECTED NOT DETECTED Final   Coronavirus OC43 NOT DETECTED NOT DETECTED Final   Metapneumovirus NOT DETECTED NOT DETECTED Final   Rhinovirus / Enterovirus NOT DETECTED NOT DETECTED Final   Influenza A NOT DETECTED NOT DETECTED Final   Influenza B NOT DETECTED NOT DETECTED Final   Parainfluenza Virus 1 NOT DETECTED NOT DETECTED Final   Parainfluenza Virus 2 NOT DETECTED NOT DETECTED Final   Parainfluenza Virus 3 NOT DETECTED NOT DETECTED Final   Parainfluenza Virus 4 NOT DETECTED NOT DETECTED Final   Respiratory Syncytial Virus NOT DETECTED NOT DETECTED Final   Bordetella pertussis NOT DETECTED NOT DETECTED Final   Bordetella Parapertussis NOT DETECTED NOT DETECTED Final   Chlamydophila pneumoniae NOT DETECTED NOT DETECTED Final   Mycoplasma pneumoniae NOT DETECTED NOT DETECTED Final    Comment: Performed at [facility], [place]. [address], [place], [place] [address]  Urine Culture     Status: None   Collection Time: 06/29/2021  7:04 PM   Specimen: In/Out Cath Urine  Result Value Ref Range Status   Specimen Description   Final    IN/OUT CATH URINE Performed at [facility] [address] [place], [place] [address]    Special Requests   Final    NONE Performed at [facility], [place] [address] [place], [place] [address]    Culture   Final    NO GROWTH Performed at  [facility], [place] [address] [place], [place] [address]    Report Status 07/07/2021 FINAL  Final  MRSA Next Gen by PCR, Nasal     Status: None   Collection Time: 07/09/21 10:18  AM   Specimen: Nasal Mucosa; Nasal Swab  Result Value Ref Range Status   MRSA by PCR Next Gen NOT DETECTED NOT DETECTED Final    Comment: (NOTE) The GeneXpert MRSA Assay (FDA approved for NASAL specimens only), is one component of a comprehensive MRSA colonization surveillance program. It is not intended to diagnose MRSA infection nor to guide or monitor treatment for MRSA infections. Test performance is not FDA approved in patients less than 48 years old. Performed at [facility], [place] [address], [place], [place] [address]           Radiology Studies:    No results found.      Medications:    Scheduled Meds:  azithromycin  500 mg Oral QHS   chlorhexidine  15 mL Mouth Rinse BID   Chlorhexidine Gluconate Cloth  6 each Topical Daily   DULoxetine  60 mg Oral QPM   enoxaparin (LOVENOX) injection  40 mg Subcutaneous Q24H   finasteride  5 mg Oral QHS   fluconazole  200 mg Oral QHS   fluticasone furoate-vilanterol  1 puff Inhalation Daily   folic acid  1 mg Oral Daily   gabapentin  600 mg Oral TID   ipratropium-albuterol  3 mL Nebulization BID   magic mouthwash w/lidocaine  10 mL Oral QID   mouth rinse  15 mL Mouth Rinse q12n4p   metoprolol tartrate  50 mg Oral BID   sertraline  100 mg Oral Daily   simvastatin  40 mg Oral QHS   tamsulosin  0.4 mg Oral QHS   traZODone  50 mg Oral QHS   Continuous Infusions:  sodium chloride 10 mL/hr (07/07/21 1900)       LOS: 8 days    Bonnielee Haff, MD [facility]  07/13/2021, 9:11 AM

## 2021-07-13 NOTE — Progress Notes (Signed)
Notified respiratory due to patient's O2 sats dropped to 86% with CPAP on 4L/min. Respiratory told nurse to bump oxygen to 6L/min. Oxygen sats went back up to 93%.

## 2021-07-13 NOTE — Care Management Important Message (Signed)
Important Message  Patient Details IM Letter placed in Patients room. Name: [name] MRN: [id number] Date of Birth: [birth year]   Medicare Important Message Given:  Yes     Kerin Salen 07/13/2021, 1:49 PM

## 2021-07-13 NOTE — Progress Notes (Signed)
Physical Therapy Treatment Patient Details Name: [name] MRN: [id number] DOB: January 04, 1941 Today's Date: 07/13/2021   History of Present Illness Pt is a 80 year old man admitted on 10/09 with fever, chills, fatigue and mouth/throat pain. +thrush, CAP, pancytopenia, neutropenia. Pt with extensive medical hx including RA, COPD, interstital lung disease/ pulmonary fibrosis on 4-5L 02 at home, chronic back pain s/p 5 back surgeries, OSA, HTN, atrial tachycardia and depression.    PT Comments    Near fall on today while ambulating. Min A to walk ~ 5 feet before pt requested to turn around and walk back (2* R hip chronic deficits) to recliner. As we began turning to return to recliner, pt began to have more trouble ambulating and began to slowly go into crouched position. Therapist placed knee under pt to support him and called for help. NT arrived to bring recliner closer however had difficulty getting chair placed well for pt to sit. Pt continued to grow weaker and weaker and kept gradually descending towards floor.  NT and PT were eventually able to heave pt over and onto edge of recliner. PT recommendation has been updated to SNF. Pt and wife live at [facility] and prefer to d/c to SNF section there.    Recommendations for follow up therapy are one component of a multi-disciplinary discharge planning process, led by the attending physician.  Recommendations may be updated based on patient status, additional functional criteria and insurance authorization.  Follow Up Recommendations  SNF     Equipment Recommendations  None recommended by PT    Recommendations for Other Services       Precautions / Restrictions Precautions Precautions: Fall Precaution Comments: 4L at rest , 5L with activity at baseline Restrictions Weight Bearing Restrictions: No     Mobility  Bed Mobility Overal bed mobility: Needs Assistance Bed Mobility: Supine to Sit     Supine to sit: Min assist;HOB  elevated     General bed mobility comments: Assist for trunk. Increased time.    Transfers Overall transfer level: Needs assistance Equipment used: Rolling walker (2 wheeled) Transfers: Sit to/from Stand Sit to Stand: Min assist         General transfer comment: Assist to rise, steady, control descent. Cues for safety, technique, hand placement  Ambulation/Gait Ambulation/Gait assistance: Min assist Gait Distance (Feet): 5 Feet Assistive device: Rolling walker (2 wheeled)       General Gait Details: Min A to walk ~ 5 feet before pt requested to turn around and walk back (2* R hip chronic deficits). As we began turning to return to recliner, pt began to have more trouble ambulating and began to slowly go into crouched position. Therapist placed knee under pt to support him and called for help. NT arrived to bring recliner closer however had difficulty getting chair in good placement. Pt continued to grow weaker and weaker and kept gradually descending towards floor, so NT and PT heaved pt over and onto edge of recliner.   Stairs             Wheelchair Mobility    Modified Rankin (Stroke Patients Only)       Balance Overall balance assessment: Needs assistance         Standing balance support: Bilateral upper extremity supported Standing balance-Leahy Scale: Poor                              Cognition Arousal/Alertness:  Awake/alert Behavior During Therapy: WFL for tasks assessed/performed Overall Cognitive Status: Within Functional Limits for tasks assessed                                 General Comments: a bit drowsy      Exercises      General Comments        Pertinent Vitals/Pain Pain Assessment: Faces Faces Pain Scale: Hurts a little bit Pain Location: mouth, throat Pain Descriptors / Indicators: Discomfort;Sore Pain Intervention(s): Limited activity within patient's tolerance;Monitored during session    Home  Living                      Prior Function            PT Goals (current goals can now be found in the care plan section) Progress towards PT goals: Not progressing toward goals - comment (weaker with near fall on today)    Frequency    Min 3X/week      PT Plan Discharge plan needs to be updated    Co-evaluation              AM-PAC PT "6 Clicks" Mobility   Outcome Measure  Help needed turning from your back to your side while in a flat bed without using bedrails?: A Little Help needed moving from lying on your back to sitting on the side of a flat bed without using bedrails?: A Little Help needed moving to and from a bed to a chair (including a wheelchair)?: A Little Help needed standing up from a chair using your arms (e.g., wheelchair or bedside chair)?: A Little Help needed to walk in hospital room?: A Lot Help needed climbing 3-5 steps with a railing? : Total 6 Click Score: 15    End of Session Equipment Utilized During Treatment: Gait belt;Oxygen Activity Tolerance: Patient limited by fatigue Patient left: in chair;with call bell/phone within reach;with family/visitor present   PT Visit Diagnosis: Muscle weakness (generalized) (M62.81);Difficulty in walking, not elsewhere classified (R26.2);Pain     Time: 1520-1601 PT Time Calculation (min) (ACUTE ONLY): 41 min  Charges:  $Gait Training: 23-37 mins $Therapeutic Activity: 8-22 mins                        Doreatha Massed, PT Acute Rehabilitation  Office: [phone] Pager: [phone]

## 2021-07-13 NOTE — Plan of Care (Signed)

## 2021-07-13 NOTE — Consult Note (Addendum)
[facility] Nurse Consult Note: Reason for Consult: Consult requested for right buttocks Wound type: Inner gluteal fold/right buttocks with dark red purple intact skin; deep tissue pressure injury, 2X2cm Pt also has scattered areas of red moist partial thickness skin loss to bilat inner buttocks; appearance is consistent with moisture associated skin damage. Pressure Injury POA: Yes Dressing procedure/placement/frequency: Topical treatment orders provided for bedside nurses to perform as follows to protect and promote healing: Apply barrier cream to inner buttocks PRN with each turning and cleaning session. Please re-consult if further assistance is needed.  Thank-you,  Julien Girt MSN, [place], [place], [place], [place]

## 2021-07-14 DIAGNOSIS — R5081 Fever presenting with conditions classified elsewhere: Secondary | ICD-10-CM | POA: Diagnosis not present

## 2021-07-14 DIAGNOSIS — R338 Other retention of urine: Secondary | ICD-10-CM | POA: Diagnosis not present

## 2021-07-14 DIAGNOSIS — I471 Supraventricular tachycardia: Secondary | ICD-10-CM | POA: Diagnosis not present

## 2021-07-14 DIAGNOSIS — D709 Neutropenia, unspecified: Secondary | ICD-10-CM | POA: Diagnosis not present

## 2021-07-14 LAB — CBC WITH DIFFERENTIAL/PLATELET
Abs Immature Granulocytes: 0.38 10*3/uL — ABNORMAL HIGH (ref 0.00–0.07)
Basophils Absolute: 0.1 10*3/uL (ref 0.0–0.1)
Basophils Relative: 1 %
Eosinophils Absolute: 0.4 10*3/uL (ref 0.0–0.5)
Eosinophils Relative: 5 %
HCT: 32 % — ABNORMAL LOW (ref 39.0–52.0)
Hemoglobin: 9.9 g/dL — ABNORMAL LOW (ref 13.0–17.0)
Immature Granulocytes: 5 %
Lymphocytes Relative: 22 %
Lymphs Abs: 1.7 10*3/uL (ref 0.7–4.0)
MCH: 37.5 pg — ABNORMAL HIGH (ref 26.0–34.0)
MCHC: 30.9 g/dL (ref 30.0–36.0)
MCV: 121.2 fL — ABNORMAL HIGH (ref 80.0–100.0)
Monocytes Absolute: 2.3 10*3/uL — ABNORMAL HIGH (ref 0.1–1.0)
Monocytes Relative: 29 %
Neutro Abs: 3 10*3/uL (ref 1.7–7.7)
Neutrophils Relative %: 38 %
Platelets: 490 10*3/uL — ABNORMAL HIGH (ref 150–400)
RBC: 2.64 MIL/uL — ABNORMAL LOW (ref 4.22–5.81)
RDW: 14.1 % (ref 11.5–15.5)
WBC: 7.8 10*3/uL (ref 4.0–10.5)
nRBC: 0.4 % — ABNORMAL HIGH (ref 0.0–0.2)

## 2021-07-14 LAB — GLUCOSE, CAPILLARY
Glucose-Capillary: 84 mg/dL (ref 70–99)
Glucose-Capillary: 144 mg/dL — ABNORMAL HIGH (ref 70–99)

## 2021-07-14 LAB — BASIC METABOLIC PANEL
Anion gap: 8 (ref 5–15)
BUN: 11 mg/dL (ref 8–23)
CO2: 34 mmol/L — ABNORMAL HIGH (ref 22–32)
Calcium: 8.6 mg/dL — ABNORMAL LOW (ref 8.9–10.3)
Chloride: 95 mmol/L — ABNORMAL LOW (ref 98–111)
Creatinine, Ser: 0.55 mg/dL — ABNORMAL LOW (ref 0.61–1.24)
GFR, Estimated: >60 mL/min (ref >60)
Glucose, Bld: 96 mg/dL (ref 70–99)
Potassium: 4.1 mmol/L (ref 3.5–5.1)
Sodium: 137 mmol/L (ref 135–145)

## 2021-07-14 MED ORDER — BISACODYL 10 MG RE SUPP: 10.0000 mg | Freq: Every day | RECTAL | Status: DC | PRN

## 2021-07-14 MED ORDER — DRONABINOL 2.5 MG PO CAPS
2.5000 mg | ORAL_CAPSULE | Freq: Two times a day (BID) | ORAL | Status: DC
  Administered 2021-07-14: 2.5 mg via ORAL
  Filled 2021-07-14: qty 1

## 2021-07-14 MED ORDER — POLYETHYLENE GLYCOL 3350 17 G PO PACK
17.0000 g | PACK | Freq: Every day | ORAL | Status: DC
  Administered 2021-07-14: 17 g via ORAL
  Filled 2021-07-14: qty 1

## 2021-07-14 MED ORDER — SENNOSIDES-DOCUSATE SODIUM 8.6-50 MG PO TABS
2.0000 | ORAL_TABLET | Freq: Two times a day (BID) | ORAL | Status: DC
  Administered 2021-07-14 (×2): 2 via ORAL
  Filled 2021-07-14 (×2): qty 2

## 2021-07-14 NOTE — Progress Notes (Signed)
Pt hasn't voided for 12 hours. Bladder scan showed 745 mL of urine in bladder. NP Olena Heckle made aware and verified standing order to insert foley catheter.Foley inserted with assistance from CN Pam and has drained 800 mL of urine. Will continue to monitor pt.

## 2021-07-14 NOTE — Progress Notes (Signed)
I have to believe that the methotrexate that he was taking for the rheumatoid arthritis probably was a source for his pancytopenia.  His blood counts certainly are improving, although the hemoglobin is a little bit slow in recovery.  His white cell count is 7.8.  Hemoglobin 9.9.  Platelet count 490,000.  MCV is incredibly high at 121.  Again I have to suspect this is going to be the methotrexate.  He is quite weak.,  She will be able to go home.  He may have to go to a rehab facility.  He is eating a little bit better.  He does not have much of an appetite.  We will try him on a little marijuana to see if this might help.  He has had no problems with diarrhea.  There is been no issues with rashes.  He says his mouth is feeling a little bit better.  We do have him on some mouth rinse.  We are going to hold off on doing a bone marrow biopsy on him right now.  I just think that the likelihood of [place] finding anything significant is going to be quite low.  His vital signs show temperature of 98.1.  Pulse 79.  Blood pressure 128/75.  His lungs sound pretty clear bilaterally.  Oral exam shows no mucositis.  Cardiac exam is regular rate and rhythm.  He has no murmurs.  Abdomen is soft.  He has good bowel sounds.  There is no fluid wave.  He has no obvious liver or spleen tip.  Extremities shows no clubbing, cyanosis or edema.  Skin exam shows some scattered ecchymoses.  Neurological exam is nonfocal.  Again, I suspect that the transient pancytopenia was from the methotrexate that he was taking.  I am not sure he needs to be on methotrexate any longer.  I know that there are several options for rheumatoid arthritis these days.  I guess the question is whether he will go home or whether some rehab facility before he can go home.  Lattie Haw, MD  Oswaldo Milian 35:4

## 2021-07-14 NOTE — NC FL2 (Signed)
[place] LEVEL OF CARE SCREENING TOOL     IDENTIFICATION  Patient Name: [name] Birthdate: April 12, 1941 Sex: male Admission Date (Current Location): 06/28/2021  [facility] and [place] Number:  Herbalist and Address:  [facility],  [address], [place]      Provider Number: [id number]  Attending Physician Name and Address:  Bonnielee Haff, MD  Relative Name and Phone Number:  Rawley Harju (spouse) [phone]    Current Level of Care: Hospital Recommended Level of Care: [place] Prior Approval Number:    Date Approved/Denied:   PASRR Number: [id number]  Discharge Plan: SNF    Current Diagnoses: Patient Active Problem List   Diagnosis Date Noted   Pressure injury of skin 07/10/2021   Neutropenic fever ([place]) 06/29/2021   Pancytopenia ([place]) 07/17/2021   Chronic lower back pain    Elevated transaminase level    Depression    Medication management 03/21/2020   Abdominal tenderness, RUQ (right upper quadrant) 02/22/2019   Lumbar adjacent segment disease with spondylolisthesis 07/10/2018   ILD (interstitial lung disease) ([place]) 07/04/2018   Dyspnea 06/13/2018   Cough due to ACE inhibitor 06/13/2018   Former tobacco use 06/13/2018   Acute sinusitis 05/30/2018   Thrush 12/07/2017   Tendinopathy of right gluteus medius 11/02/2017   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[place]) 46/65/9935   Hardware complicating wound infection ([place])    Vertebral osteomyelitis ([place])    Pseudomonas aeruginosa infection    Rheumatoid arthritis involving multiple sites with positive rheumatoid factor ([place])    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/2017   Hypoxia    Lumbar degenerative disc disease 02/17/2017   Cigarette smoker 11/17/2016   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   Allergic rhinitis 03/23/2008   HYPERLIPIDEMIA 03/15/2008   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   PNEUMONIA 03/14/2008    GOLD COPD II B 03/14/2008   Rheumatoid arthritis ([place]) 03/14/2008   OSA on CPAP 03/14/2008    Orientation RESPIRATION BLADDER Height & Weight     Self, Time, Situation, Place  O2 Continent Weight: 101.7 kg Height:  _0  (170.2 cm)  BEHAVIORAL SYMPTOMS/MOOD NEUROLOGICAL BOWEL NUTRITION STATUS      Continent Diet (Heart Healthy)  AMBULATORY STATUS COMMUNICATION OF NEEDS Skin   Limited Assist Verbally Normal                       Personal Care Assistance Level of Assistance  Bathing, Feeding, Dressing Bathing Assistance: Limited assistance Feeding assistance: Limited assistance Dressing Assistance: Limited assistance     Functional Limitations Info  Sight, Hearing, Speech Sight Info: Impaired (eyeglasses) Hearing Info: Adequate Speech Info: Adequate    SPECIAL CARE FACTORS FREQUENCY  PT (By licensed PT), OT (By licensed OT)     PT Frequency:  (5x week) OT Frequency:  (5x week)            Contractures Contractures Info: Not present    Additional Factors Info  Code Status, Allergies, Psychotropic Code Status Info:  (DNR) Allergies Info:  (Sulfa Antibiotics, Sulfamethoxazole-trimethoprim, Sulfasalazine, Other, Varenicline, Lisinopril) Psychotropic Info:  (Cymbalta,Desyrel)         Current Medications (07/14/2021):  This is the current hospital active medication list Current Facility-Administered Medications  Medication Dose Route Frequency Provider Last Rate Last Admin   0.9 %  sodium chloride infusion   Intravenous PRN Opyd, Ilene Qua, MD 10 mL/hr at 07/07/21 1900 10 mL/hr at 07/07/21 1900  acetaminophen (TYLENOL) tablet 650 mg  650 mg Oral Q6H PRN Opyd, Ilene Qua, MD   650 mg at 07/13/21 2041   albuterol (PROVENTIL) (2.5 MG/3ML) 0.083% nebulizer solution 2.5 mg  2.5 mg Nebulization Q6H PRN Bonnielee Haff, MD   2.5 mg at 07/09/21 1746   bisacodyl (DULCOLAX) suppository 10 mg  10 mg Rectal Daily PRN Bonnielee Haff, MD       chlorhexidine (PERIDEX) 0.12  % solution 15 mL  15 mL Mouth Rinse BID Bonnielee Haff, MD   15 mL at 07/14/21 0855   Chlorhexidine Gluconate Cloth 2 % PADS 6 each  6 each Topical Daily Bonnielee Haff, MD   6 each at 07/14/21 1125   DULoxetine (CYMBALTA) DR capsule 60 mg  60 mg Oral QPM Bonnielee Haff, MD   60 mg at 07/13/21 1804   enoxaparin (LOVENOX) injection 40 mg  40 mg Subcutaneous Q24H Bonnielee Haff, MD   40 mg at 07/13/21 1805   finasteride (PROSCAR) tablet 5 mg  5 mg Oral QHS Opyd, Ilene Qua, MD   5 mg at 07/13/21 2041   fluconazole (DIFLUCAN) tablet 200 mg  200 mg Oral QHS Leodis Sias T, RPH   200 mg at 07/13/21 2041   fluticasone furoate-vilanterol (BREO ELLIPTA) 100-25 MCG/INH 1 puff  1 puff Inhalation Daily Opyd, Ilene Qua, MD   1 puff at 67/61/95 0932   folic acid (FOLVITE) tablet 1 mg  1 mg Oral Daily Opyd, Ilene Qua, MD   1 mg at 07/14/21 0854   gabapentin (NEURONTIN) capsule 600 mg  600 mg Oral TID Bonnielee Haff, MD   600 mg at 07/14/21 0853   guaiFENesin-dextromethorphan (ROBITUSSIN DM) 100-10 MG/5ML syrup 5 mL  5 mL Oral Q4H PRN Opyd, Ilene Qua, MD       ipratropium-albuterol (DUONEB) 0.5-2.5 (3) MG/3ML nebulizer solution 3 mL  3 mL Nebulization BID Bonnielee Haff, MD   3 mL at 07/14/21 6712   magic mouthwash w/lidocaine  10 mL Oral QID Volanda Napoleon, MD   10 mL at 07/14/21 4580   magic mouthwash  5 mL Oral TID PRN Vianne Bulls, MD   5 mL at 07/09/21 1814   MEDLINE mouth rinse  15 mL Mouth Rinse q12n4p Bonnielee Haff, MD   15 mL at 07/13/21 1617   metoprolol tartrate (LOPRESSOR) injection 2.5 mg  2.5 mg Intravenous Q6H PRN Bonnielee Haff, MD   2.5 mg at 07/11/21 2042   metoprolol tartrate (LOPRESSOR) tablet 50 mg  50 mg Oral BID Bonnielee Haff, MD   50 mg at 07/14/21 0854   naloxone [facility]) injection 0.4 mg  0.4 mg Intravenous PRN Bonnielee Haff, MD   0.4 mg at 07/10/21 1229   ondansetron (ZOFRAN) tablet 4 mg  4 mg Oral Q6H PRN Opyd, Ilene Qua, MD       Or   ondansetron (ZOFRAN) injection  4 mg  4 mg Intravenous Q6H PRN Opyd, Ilene Qua, MD       phenol (CHLORASEPTIC) mouth spray 1 spray  1 spray Mouth/Throat PRN Opyd, Ilene Qua, MD       polyethylene glycol (MIRALAX / GLYCOLAX) packet 17 g  17 g Oral Daily Bonnielee Haff, MD       senna-docusate (Senokot-S) tablet 2 tablet  2 tablet Oral BID Bonnielee Haff, MD       sertraline (ZOLOFT) tablet 100 mg  100 mg Oral Daily Bonnielee Haff, MD   100 mg at 07/13/21 1806   simvastatin (ZOCOR) tablet  40 mg  40 mg Oral QHS Bonnielee Haff, MD   40 mg at 07/13/21 2046   tamsulosin (FLOMAX) capsule 0.4 mg  0.4 mg Oral QHS Opyd, Ilene Qua, MD   0.4 mg at 07/13/21 2041   traMADol (ULTRAM) tablet 50-100 mg  50-100 mg Oral Q6H PRN Vianne Bulls, MD   100 mg at 07/14/21 0854   traZODone (DESYREL) tablet 50 mg  50 mg Oral QHS Bonnielee Haff, MD   50 mg at 07/13/21 2041     Discharge Medications: Please see discharge summary for a list of discharge medications.  Relevant Imaging Results:  Relevant Lab Results:   Additional Information SS#[id number] [place]  Sabien Umland, Juliann Pulse, RN

## 2021-07-14 NOTE — Progress Notes (Addendum)
Progress Note:    [name]    YIF:[id number] DOB: 04-13-1941 DOA: 07/13/2021  PCP: Javier Glazier, MD    Brief Narrative:   80 year old male with extensive past medical history including rheumatoid arthritis, interstitial lung disease/pulmonary fibrosis, COPD, obstructive sleep apnea, chronic hypoxic respiratory failure on 4 L, hypertension, multifocal atrial tachycardia, chronic back pain, history of hardware associated vertebral osteomyelitis, history of splenectomy.  Presented to the emergency department with fever chills fatigue.  Was noted to be pancytopenic.  Found to have oral candidiasis.  Hospitalize for further management.    Plan was to do a bone marrow biopsy.  During the preparation for the bone marrow biopsy on 10/14 patient was given sedative agents with which he became hypoxic.  The procedure had to be canceled.  He was transferred to stepdown on BiPAP.  Subsequently stabilized and then transferred back to the floor.  Foley catheter had to be placed for retention.  Patient has become significantly deconditioned.  PT is now recommending SNF    Subjective:   Patient overall feels very fatigued.  Uneventful night for the most part.  Wife mentioned that his oxygen had to be turned up again last night but I do not see any notes reflecting this.  Patient does not mention any worsening respiratory symptoms at this time.   Assessment and Plan:   Acute respiratory failure with hypoxia and hypercapnia This occurred when patient was given sedative medications prior to brown marrow biopsy on 10/14.  Was given Narcan and flumazenil.  Was also placed on BiPAP.  Chest x-ray did suggest opacity in the right base.  Possible he may have aspirated.  However he was already on ceftriaxone and azithromycin and so antibiotics were not changed. From a respiratory standpoint patient is stable.  Chest x-ray was repeated yesterday and suggest atelectasis.  Incentive spirometer has  been ordered.  This was mentioned to the patient and his wife. He is noted to be on 4 L of oxygen this morning.  Unclear why he is requiring higher amounts of oxygen at night.  Could be a problem with the CPAP mask.  Does not appear to be particularly volume overloaded.  Patient and wife were reassured.  Pancytopenia/neutropenic with fever: Patient was being seen by medical oncology/hematology. Patient was noted to be febrile and was started on cefepime.  Cultures have all been negative.  Apart from the community-acquired pneumonia no other source of infection has been found.  Patient has completed 7-day course of ceftriaxone and azithromycin.   His WBC has improved in the interim.  Neutrophils are now normal.  Platelet counts are normal.  Bone marrow biopsy was attempted on 10/14 but had to be canceled since patient became hypoxic after getting sedative medications. After discussions with Dr. Marin Olp with hematology patient no longer needs a bone marrow biopsy.  His WBC will need to be monitored closely in the outpatient setting. Wonder if acute infection vs methotrexate played a role in his pancytopenia.  Odynophagia/oral candidiasis Continue Diflucan.  HIV negative.  Hepatitis panel is unremarkable.  Case was discussed with gastroenterology who recommended continuing current management.  Seems to be doing better.  Community-acquired pneumonia He has completed course of ceftriaxone and azithromycin.  Noted to be afebrile.  WBC is normal.  Chronic hypoxic respiratory failure Continue oxygen.  Uses 4L at baseline.  See above  History of pulmonary fibrosis, ILD Used to be on nintedanib.  But has not taken it in the  last month or so.  Has been discontinued.  Multifocal atrial tachycardia Has had numerous episodes of intermittent tachycardia.  Metoprolol dose was increased.  Heart rate seems to be better controlled now.  Continue to monitor on telemetry.   Surprisingly the EKG done on 10/14 was  interpreted by cardiologist as atrial fibrillation.  Patient previously with history of multifocal atrial tachycardia.  Discussed with Dr. Acie Fredrickson who has reviewed this EKG and feels that this is indeed MAT rather than atrial fibrillation.  Do not anticipate any further work-up at this time as long as the heart rate remained stable.  Urinary retention with history of BPH Likely due to immobilization for the last several days.  He required in and out catheterization.  Finally a Foley catheter was placed last night.  Voiding trial once he is more mobile. He remains on Flomax and Proscar.    Rheumatoid arthritis Last dose of methotrexate was 10/8.  Currently on hold.  Essential hypertension/hypokalemia/hyponatremia Antihypertensives were placed on hold due to borderline low blood pressures.  Patient back on metoprolol.  Blood pressure seems to be stable for the most part.  Electrolytes are stable this morning.    Hypoglycemia Had hypoglycemia on 10/16.  This was likely due to poor oral intake.  Diet was reinitiated.  CBGs are now normal.    Chronic pain Noted to be on tramadol, Cymbalta and gabapentin.    Depression Noted to be on Zoloft, Cymbalta, trazodone.    Dyslipidemia Continue Zocor  COPD Stable.  Continue Breo, Spiriva, as needed albuterol, CPAP nightly  Obesity Estimated body mass index is 35.12 kg/m as calculated from the following:   Height as of this encounter: _0  (1.702 m).   Weight as of this encounter: 101.7 kg.    Other information:    DVT prophylaxis: Lovenox Code Status: DNR Family Communication: Patient and his wife updated at bedside. Disposition: Patient has become very deconditioned in the last 3 to 4 days.  SNF is recommended by physical therapy.  Status is: Inpatient  Remains inpatient appropriate because:Ongoing diagnostic testing needed not appropriate for outpatient work up  Dispo: The patient is from: ILF              Anticipated d/c is to:  SNF              Patient currently is not medically stable to d/c.   Difficult to place patient No      Consultants:   Oncology Interventional radiology    Objective:    Vitals:   07/13/21 1326 07/13/21 2044 07/14/21 0553 07/14/21 0738  BP: 130/72 (!) 138/98 124/76   Pulse: 84 95 78   Resp: _1 Temp: 99.3 F (37.4 C) 98.8 F (37.1 C) 98.3 F (36.8 C)   TempSrc:      SpO2: 99% 93% 94% 92%  Weight:      Height:        Intake/Output Summary (Last 24 hours) at 07/14/2021 1118 Last data filed at 07/14/2021 0640 Gross per 24 hour  Intake 120 ml  Output 800 ml  Net -680 ml    Filed Weights   06/30/2021 1141 07/26/2021 2009 07/10/21 1152  Weight: 100.2 kg 100.3 kg 101.7 kg       Physical Exam:    General appearance: Awake alert.  In no distress Resp: Few crackles at the bases bilaterally.  No wheezing or rhonchi appreciated today. Cardio: S1-S2 is normal regular.  No S3-S4.  No rubs murmurs or bruit GI: Abdomen is soft.  Nontender nondistended.  Bowel sounds are present normal.  No masses organomegaly Extremities: No edema.  Able to move all of his extremities though physical deconditioning is noted. Neurologic: Alert and oriented x3.  No focal neurological deficits.        Data Reviewed:    I have personally reviewed following labs and imaging studies  CBC: Recent Labs  Lab 07/09/21 2336 07/11/21 0755 07/12/21 0239 07/13/21 0455 07/14/21 0411  WBC 2.6* 4.9 5.9 7.4 7.8  NEUTROABS 0.7* 1.8 2.4 2.7 3.0  HGB 9.7* 9.8* 9.7* 10.3* 9.9*  HCT 29.8* 30.1* 30.8* 31.8* 32.0*  MCV 118.7* 117.1* 118.5* 119.1* 121.2*  PLT 241 337 404* 457* 490*     Basic Metabolic Panel: Recent Labs  Lab 07/09/21 0329 07/10/21 0042 07/11/21 0253 07/12/21 0239 07/13/21 0455 07/14/21 0411  NA 133* 133* 136 137 136 137  K 4.0 3.8 3.9 3.9 3.4* 4.1  CL 98 94* 95* 96* 96* 95*  CO2 28 33* 34* 33* 33* 34*  GLUCOSE 126* 105* 81 67* 100* 96  BUN _0 CREATININE 0.61 0.72 0.60* 0.63 0.58* 0.55*  CALCIUM 8.6* 8.5* 8.4* 8.2* 8.3* 8.6*  MG 2.0  --   --   --  2.0  --      GFR: Estimated Creatinine Clearance: 85 mL/min (A) (by C-G formula based on SCr of 0.55 mg/dL (L)).  Liver Function Tests: Recent Labs  Lab 07/08/21 0344  AST 26  ALT 49*  ALKPHOS 36*  BILITOT 1.1  PROT 5.1*  ALBUMIN 2.4*      Recent Results (from the past 240 hour(s))  Culture, blood (Routine x 2)     Status: None   Collection Time: 07/12/2021 10:54 AM   Specimen: Right Antecubital; Blood  Result Value Ref Range Status   Specimen Description   Final    RIGHT ANTECUBITAL BLOOD Performed at [facility] [address] [place], [place] [address]    Special Requests   Final    Blood Culture adequate volume BOTTLES DRAWN AEROBIC AND ANAEROBIC Performed at [facility], [address], [place], [place] [address]    Culture   Final    NO GROWTH 5 DAYS Performed at [facility], [place] [address], [place], [place] [address]    Report Status 07/10/2021 FINAL  Final  Culture, blood (Routine x 2)     Status: None   Collection Time: 07/25/2021 11:20 AM   Specimen: Left Antecubital; Blood  Result Value Ref Range Status   Specimen Description   Final    LEFT ANTECUBITAL BLOOD Performed at [facility], [address] [place], [place] [address]    Special Requests   Final    Blood Culture adequate volume BOTTLES DRAWN AEROBIC AND ANAEROBIC Performed at [facility], [address] [place], [place] [address]    Culture   Final    NO GROWTH 5 DAYS Performed at [facility], [place] [address] [place], [place] [address]    Report Status 07/10/2021 FINAL  Final  Resp Panel by RT-PCR (Flu A&B, Covid) Nasopharyngeal Swab     Status: None   Collection Time: 07/18/2021 11:42 AM   Specimen: Nasopharyngeal Swab; Nasopharyngeal(NP) swabs in vial transport medium  Result Value Ref Range Status   SARS Coronavirus 2  by RT PCR NEGATIVE NEGATIVE Final  Comment: (NOTE) SARS-CoV-2 target nucleic acids are NOT DETECTED.  The SARS-CoV-2 RNA is generally detectable in upper respiratory specimens during the acute phase of infection. The lowest concentration of SARS-CoV-2 viral copies this assay can detect is 138 copies/mL. A negative result does not preclude SARS-Cov-2 infection and should not be used as the sole basis for treatment or other patient management decisions. A negative result may occur with  improper specimen collection/handling, submission of specimen other than nasopharyngeal swab, presence of viral mutation(s) within the areas targeted by this assay, and inadequate number of viral copies(<138 copies/mL). A negative result must be combined with clinical observations, patient history, and epidemiological information. The expected result is Negative.  Fact Sheet for Patients:  EntrepreneurPulse.com.au  Fact Sheet for Healthcare Providers:  IncredibleEmployment.be  This test is no t yet approved or cleared by the [place] FDA and  has been authorized for detection and/or diagnosis of SARS-CoV-2 by FDA under an Emergency Use Authorization (EUA). This EUA will remain  in effect (meaning this test can be used) for the duration of the COVID-19 declaration under Section 564(b)(1) of the Act, 21 U.S.C.section 360bbb-3(b)(1), unless the authorization is terminated  or revoked sooner.       Influenza A by PCR NEGATIVE NEGATIVE Final   Influenza B by PCR NEGATIVE NEGATIVE Final    Comment: (NOTE) The Xpert Xpress SARS-CoV-2/FLU/RSV plus assay is intended as an aid in the diagnosis of influenza from Nasopharyngeal swab specimens and should not be used as a sole basis for treatment. Nasal washings and aspirates are unacceptable for Xpert Xpress SARS-CoV-2/FLU/RSV testing.  Fact Sheet for Patients: EntrepreneurPulse.com.au  Fact  Sheet for Healthcare Providers: IncredibleEmployment.be  This test is not yet approved or cleared by the [place] FDA and has been authorized for detection and/or diagnosis of SARS-CoV-2 by FDA under an Emergency Use Authorization (EUA). This EUA will remain in effect (meaning this test can be used) for the duration of the COVID-19 declaration under Section 564(b)(1) of the Act, 21 U.S.C. section 360bbb-3(b)(1), unless the authorization is terminated or revoked.  Performed at [facility], [address] [place], [place] [address]   Respiratory (~20 pathogens) panel by PCR     Status: None   Collection Time: 07/02/2021 12:09 PM   Specimen: Nasopharyngeal Swab; Respiratory  Result Value Ref Range Status   Adenovirus NOT DETECTED NOT DETECTED Final   Coronavirus 229E NOT DETECTED NOT DETECTED Final    Comment: (NOTE) The Coronavirus on the Respiratory Panel, DOES NOT test for the novel  Coronavirus (2019 nCoV)    Coronavirus HKU1 NOT DETECTED NOT DETECTED Final   Coronavirus NL63 NOT DETECTED NOT DETECTED Final   Coronavirus OC43 NOT DETECTED NOT DETECTED Final   Metapneumovirus NOT DETECTED NOT DETECTED Final   Rhinovirus / Enterovirus NOT DETECTED NOT DETECTED Final   Influenza A NOT DETECTED NOT DETECTED Final   Influenza B NOT DETECTED NOT DETECTED Final   Parainfluenza Virus 1 NOT DETECTED NOT DETECTED Final   Parainfluenza Virus 2 NOT DETECTED NOT DETECTED Final   Parainfluenza Virus 3 NOT DETECTED NOT DETECTED Final   Parainfluenza Virus 4 NOT DETECTED NOT DETECTED Final   Respiratory Syncytial Virus NOT DETECTED NOT DETECTED Final   Bordetella pertussis NOT DETECTED NOT DETECTED Final   Bordetella Parapertussis NOT DETECTED NOT DETECTED Final   Chlamydophila pneumoniae NOT DETECTED NOT DETECTED Final   Mycoplasma pneumoniae NOT DETECTED NOT DETECTED Final    Comment: Performed at [facility], [place] [address], [place], [place]  [address]  Urine Culture     Status: None   Collection Time: 06/30/2021  7:04 PM   Specimen: In/Out Cath Urine  Result Value Ref Range Status   Specimen Description   Final    IN/OUT CATH URINE Performed at [place] [address], [place], [place] [address]    Special Requests   Final    NONE Performed at [facility], [place] [address], [place], [place] [address]    Culture   Final    NO GROWTH Performed at [facility], [place] [address] [place],  [address]    Report Status 07/07/2021 FINAL  Final  MRSA Next Gen by PCR, Nasal     Status: None   Collection Time: 07/09/21 10:18 AM   Specimen: Nasal Mucosa; Nasal Swab  Result Value Ref Range Status   MRSA by PCR Next Gen NOT DETECTED NOT DETECTED Final    Comment: (NOTE) The GeneXpert MRSA Assay (FDA approved for NASAL specimens only), is one component of a comprehensive MRSA colonization surveillance program. It is not intended to diagnose MRSA infection nor to guide or monitor treatment for MRSA infections. Test performance is not FDA approved in patients less than 41 years old. Performed at [facility], [place] [address], [place],  [address]           Radiology Studies:    DG CHEST PORT 1 VIEW  Result Date: 07/13/2021 CLINICAL DATA:  Worsening cough thus morning EXAM: PORTABLE CHEST 1 VIEW COMPARISON:  Chest radiograph 07/10/2021 FINDINGS: The heart is enlarged, unchanged. The mediastinum is prominent, also not significantly changed and likely exaggerated by AP technique. There are hazy opacities in the left base with increased silhouetting of the left hemidiaphragm. Small bilateral pleural effusions are again suspected. There is no new or worsening focal airspace disease. There is no overt pulmonary edema. There is no pneumothorax. There is no acute osseous abnormality. IMPRESSION: 1. Increased opacity in the left base with silhouetting of the left hemidiaphragm  may reflect worsening atelectasis or developing infection. 2. Unchanged small bilateral pleural effusions. Electronically Signed   By: Valetta Mole M.D.   On: 07/13/2021 12:44        Medications:    Scheduled Meds:  chlorhexidine  15 mL Mouth Rinse BID   Chlorhexidine Gluconate Cloth  6 each Topical Daily   DULoxetine  60 mg Oral QPM   enoxaparin (LOVENOX) injection  40 mg Subcutaneous Q24H   finasteride  5 mg Oral QHS   fluconazole  200 mg Oral QHS   fluticasone furoate-vilanterol  1 puff Inhalation Daily   folic acid  1 mg Oral Daily   gabapentin  600 mg Oral TID   ipratropium-albuterol  3 mL Nebulization BID   magic mouthwash w/lidocaine  10 mL Oral QID   mouth rinse  15 mL Mouth Rinse q12n4p   metoprolol tartrate  50 mg Oral BID   polyethylene glycol  17 g Oral Daily   senna-docusate  2 tablet Oral BID   sertraline  100 mg Oral Daily   simvastatin  40 mg Oral QHS   tamsulosin  0.4 mg Oral QHS   traZODone  50 mg Oral QHS   Continuous Infusions:  sodium chloride 10 mL/hr (07/07/21 1900)       LOS: 9 days    Bonnielee Haff, MD [facility]  07/14/2021, 11:18 AM

## 2021-07-14 NOTE — Plan of Care (Signed)
  Problem: Clinical Measurements: Goal: Will remain free from infection Outcome: Progressing Goal: Respiratory complications will improve Outcome: Progressing   Problem: Activity: Goal: Risk for activity intolerance will decrease Outcome: Progressing   Problem: Nutrition: Goal: Adequate nutrition will be maintained Outcome: Progressing   Problem: Pain Managment: Goal: General experience of comfort will improve Outcome: Progressing   Problem: Safety: Goal: Ability to remain free from injury will improve Outcome: Progressing   Problem: Skin Integrity: Goal: Risk for impaired skin integrity will decrease Outcome: Progressing

## 2021-07-15 DIAGNOSIS — D61818 Other pancytopenia: Secondary | ICD-10-CM | POA: Diagnosis not present

## 2021-07-15 DIAGNOSIS — F32A Depression, unspecified: Secondary | ICD-10-CM | POA: Diagnosis not present

## 2021-07-15 DIAGNOSIS — M0579 Rheumatoid arthritis with rheumatoid factor of multiple sites without organ or systems involvement: Secondary | ICD-10-CM | POA: Diagnosis not present

## 2021-07-15 DIAGNOSIS — J849 Interstitial pulmonary disease, unspecified: Secondary | ICD-10-CM | POA: Diagnosis not present

## 2021-07-15 DIAGNOSIS — J42 Unspecified chronic bronchitis: Secondary | ICD-10-CM | POA: Diagnosis not present

## 2021-07-15 DIAGNOSIS — R5081 Fever presenting with conditions classified elsewhere: Secondary | ICD-10-CM

## 2021-07-15 DIAGNOSIS — D72819 Decreased white blood cell count, unspecified: Secondary | ICD-10-CM | POA: Diagnosis not present

## 2021-07-15 DIAGNOSIS — R7401 Elevation of levels of liver transaminase levels: Secondary | ICD-10-CM | POA: Diagnosis not present

## 2021-07-15 DIAGNOSIS — Z515 Encounter for palliative care: Secondary | ICD-10-CM | POA: Diagnosis not present

## 2021-07-15 DIAGNOSIS — Z7189 Other specified counseling: Secondary | ICD-10-CM

## 2021-07-15 LAB — CBC WITH DIFFERENTIAL/PLATELET
Abs Immature Granulocytes: 0.64 10*3/uL — ABNORMAL HIGH (ref 0.00–0.07)
Basophils Absolute: 0.2 10*3/uL — ABNORMAL HIGH (ref 0.0–0.1)
Basophils Relative: 2 %
Eosinophils Absolute: 0.5 10*3/uL (ref 0.0–0.5)
Eosinophils Relative: 6 %
HCT: 32.9 % — ABNORMAL LOW (ref 39.0–52.0)
Hemoglobin: 10.3 g/dL — ABNORMAL LOW (ref 13.0–17.0)
Immature Granulocytes: 7 %
Lymphocytes Relative: 23 %
Lymphs Abs: 2 10*3/uL (ref 0.7–4.0)
MCH: 37.7 pg — ABNORMAL HIGH (ref 26.0–34.0)
MCHC: 31.3 g/dL (ref 30.0–36.0)
MCV: 120.5 fL — ABNORMAL HIGH (ref 80.0–100.0)
Monocytes Absolute: 2.9 10*3/uL — ABNORMAL HIGH (ref 0.1–1.0)
Monocytes Relative: 33 %
Neutro Abs: 2.4 10*3/uL (ref 1.7–7.7)
Neutrophils Relative %: 29 %
Platelets: 561 10*3/uL — ABNORMAL HIGH (ref 150–400)
RBC: 2.73 MIL/uL — ABNORMAL LOW (ref 4.22–5.81)
RDW: 14.1 % (ref 11.5–15.5)
WBC: 8.6 10*3/uL (ref 4.0–10.5)
nRBC: 0.2 % (ref 0.0–0.2)

## 2021-07-15 LAB — BASIC METABOLIC PANEL
Anion gap: 8 (ref 5–15)
BUN: 9 mg/dL (ref 8–23)
CO2: 34 mmol/L — ABNORMAL HIGH (ref 22–32)
Calcium: 8.7 mg/dL — ABNORMAL LOW (ref 8.9–10.3)
Chloride: 94 mmol/L — ABNORMAL LOW (ref 98–111)
Creatinine, Ser: 0.66 mg/dL (ref 0.61–1.24)
GFR, Estimated: >60 mL/min (ref >60)
Glucose, Bld: 109 mg/dL — ABNORMAL HIGH (ref 70–99)
Potassium: 3.8 mmol/L (ref 3.5–5.1)
Sodium: 136 mmol/L (ref 135–145)

## 2021-07-15 LAB — GLUCOSE, CAPILLARY
Glucose-Capillary: 76 mg/dL (ref 70–99)
Glucose-Capillary: 110 mg/dL — ABNORMAL HIGH (ref 70–99)

## 2021-07-15 LAB — SARS CORONAVIRUS 2 (TAT 6-24 HRS): SARS Coronavirus 2: NEGATIVE

## 2021-07-15 MED ORDER — MORPHINE SULFATE (PF) 2 MG/ML IV SOLN
1.0000 mg | INTRAVENOUS | Status: DC | PRN
  Administered 2021-07-15 (×2): 1 mg via INTRAVENOUS
  Filled 2021-07-15 (×2): qty 1

## 2021-07-15 MED ORDER — GLYCOPYRROLATE 1 MG PO TABS
1.0000 mg | ORAL_TABLET | ORAL | Status: DC | PRN
  Filled 2021-07-15: qty 1

## 2021-07-15 MED ORDER — HALOPERIDOL LACTATE 5 MG/ML IJ SOLN: 0.5000 mg | INTRAMUSCULAR | Status: DC | PRN

## 2021-07-15 MED ORDER — MORPHINE SULFATE (PF) 2 MG/ML IV SOLN
2.0000 mg | INTRAVENOUS | Status: DC
  Administered 2021-07-15 (×2): 2 mg via INTRAVENOUS
  Filled 2021-07-15 (×2): qty 1

## 2021-07-15 MED ORDER — LORAZEPAM 2 MG/ML PO CONC: 1.0000 mg | ORAL | Status: DC | PRN

## 2021-07-15 MED ORDER — MIDAZOLAM HCL 2 MG/2ML IJ SOLN
INTRAMUSCULAR | Status: DC | PRN
  Administered 2021-07-10: 1 mg via INTRAVENOUS

## 2021-07-15 MED ORDER — FLUMAZENIL 0.5 MG/5ML IV SOLN
INTRAVENOUS | Status: DC | PRN
  Administered 2021-07-10: .2 mg via INTRAVENOUS

## 2021-07-15 MED ORDER — MORPHINE SULFATE (PF) 2 MG/ML IV SOLN
2.0000 mg | INTRAVENOUS | Status: DC | PRN
Start: 2021-07-15 — End: 2021-07-16
  Administered 2021-07-15: 2 mg via INTRAVENOUS
  Filled 2021-07-15 (×2): qty 1

## 2021-07-15 MED ORDER — LORAZEPAM 2 MG/ML IJ SOLN: 1.0000 mg | INTRAMUSCULAR | Status: DC | PRN

## 2021-07-15 MED ORDER — NALOXONE HCL 0.4 MG/ML IJ SOLN
INTRAMUSCULAR | Status: DC | PRN
  Administered 2021-07-10: .2 mg via INTRAVENOUS

## 2021-07-15 MED ORDER — ACETAMINOPHEN 650 MG RE SUPP: 650.0000 mg | Freq: Four times a day (QID) | RECTAL | Status: DC | PRN

## 2021-07-15 MED ORDER — GLYCOPYRROLATE 0.2 MG/ML IJ SOLN
0.2000 mg | INTRAMUSCULAR | Status: DC | PRN
  Administered 2021-07-15: 0.2 mg via INTRAVENOUS
  Filled 2021-07-15 (×3): qty 1

## 2021-07-15 MED ORDER — BIOTENE DRY MOUTH MT LIQD: 15.0000 mL | OROMUCOSAL | Status: DC | PRN

## 2021-07-15 MED ORDER — HALOPERIDOL 0.5 MG PO TABS
0.5000 mg | ORAL_TABLET | ORAL | Status: DC | PRN
  Filled 2021-07-15: qty 1

## 2021-07-15 MED ORDER — ACETAMINOPHEN 325 MG PO TABS: 650.0000 mg | ORAL_TABLET | Freq: Four times a day (QID) | ORAL | Status: DC | PRN

## 2021-07-15 MED ORDER — GLYCOPYRROLATE 0.2 MG/ML IJ SOLN
0.2000 mg | INTRAMUSCULAR | Status: DC | PRN
  Filled 2021-07-15: qty 1

## 2021-07-15 MED ORDER — LORAZEPAM 1 MG PO TABS: 1.0000 mg | ORAL_TABLET | ORAL | Status: DC | PRN

## 2021-07-15 MED ORDER — FENTANYL CITRATE (PF) 100 MCG/2ML IJ SOLN
INTRAMUSCULAR | Status: DC | PRN
  Administered 2021-07-10: 50 ug via INTRAVENOUS

## 2021-07-15 MED ORDER — HALOPERIDOL LACTATE 2 MG/ML PO CONC
0.5000 mg | ORAL | Status: DC | PRN
  Filled 2021-07-15: qty 0.3

## 2021-07-15 MED ORDER — POLYVINYL ALCOHOL 1.4 % OP SOLN
1.0000 [drp] | Freq: Four times a day (QID) | OPHTHALMIC | Status: DC | PRN
  Filled 2021-07-15: qty 15

## 2021-07-20 ENCOUNTER — Telehealth: Payer: Self-pay | Admitting: Internal Medicine

## 2021-07-20 NOTE — Telephone Encounter (Signed)
Patient family left message today to let Dr. Chase Caller know Patient passed 08-12-21.  Message routed to Dr. Chase Caller

## 2021-07-21 ENCOUNTER — Ambulatory Visit: Payer: Medicare PPO | Admitting: Internal Medicine

## 2021-07-22 ENCOUNTER — Ambulatory Visit: Payer: Medicare PPO | Admitting: Internal Medicine

## 2021-07-28 NOTE — TOC Progression Note (Signed)
Transition of Care [facility]) - Progression Note    Patient Details  Name: [name] MRN: [id number] Date of Birth: [birth year]  Transition of Care [facility]) CM/SW Contact  Corlene Sabia, Juliann Pulse, RN Phone Number: 06-Aug-2021, 11:47 AM  Clinical Narrative: Noted for comfort care-will monitor for referral if home hospice vs residential hospice vs hospital is recc.      Expected Discharge Plan:  (TBD) Barriers to Discharge: Continued Medical Work up  Expected Discharge Plan and Services Expected Discharge Plan:  (TBD)   Discharge Planning Services: CM Consult Post Acute Care Choice: [place] Living arrangements for the past 2 months: [place]                                       Social Determinants of Health (SDOH) Interventions    Readmission Risk Interventions No flowsheet data found.

## 2021-07-28 NOTE — Progress Notes (Signed)
Patient resting comfortably in bed at this time. Not alert, does not appear to be in distress. Morphine given x3 this shift and appeared to be effective. Pt only with home 4L/[place] at this time- NRB removed per family request at 1430. FC remains intact with no complications. Family at bedside, bereavement snack cart refreshed. Will continue to monitor closely.

## 2021-07-28 NOTE — Progress Notes (Addendum)
PT Cancellation Note  Patient Details Name: [name] MRN: [id number] DOB: Sep 24, 1941   Cancelled Treatment:    Reason Eval/Treat Not Completed:Palliative consult pending 2* medical decline. Will hold PT for now and await GOC.  Addendum: Pt is now comfort care. Will sign off.     [facility]  Office: [phone] Pager: [phone]

## 2021-07-28 NOTE — Death Summary Note (Signed)
Death Summary  [name] WUX:[id number] DOB: 02/23/1941 DOA: 07-31-21  PCP: Javier Glazier, MD  Admit date: July 31, 2021 Date of Death: 08/11/21 Time of Death: 20:49 Notification: Javier Glazier, MD notified of death of Aug 11, 2021   History of present illness:  [name] is a 80 y.o. 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 was admitted to the hospital with the working diagnosis of acute hypoxemic and hypercapnic respiratory failure, in the setting of community-acquired pneumonia and pancytopenia.    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  He reported 10 days of throat pain, white plaques involving his oral mucosa with ulcers, positive cough and dyspnea.  His symptoms were refractive to outpatient therapy with clotrimazole, azithromycin and prednisone.  On his initial physical examination he was febrile 38.9 C, his oxygen saturation was in the 90s on 4 L supplemental oxygen per nasal cannula, heart rate 75, respiratory rate 20, blood pressure 125/78.  He had decreased breath sounds bilaterally, heart S1-S2, present, rhythmic, soft abdomen, no lower extremity edema.  Patient was alert and oriented.   Sodium 134, potassium 3.0, chloride 96, bicarb 29, glucose 95, BUN 20, creatinine 0.87, white count 1.5,  hemoglobin 10.3, hematocrit 30.1, platelets 33. SARS COVID-19 negative.   Urinalysis specific gravity 1.005, negative nitrates.   His chest radiograph showed bilateral lobe atelectasis, small right pleural effusion.   EKG 94 bpm, left axis deviation, left anterior fascicular block, normal intervals, sinus rhythm with PACs, no significant ST segment or T wave changes.   Patient was placed on antibiotic therapy with azithromycin and ceftriaxone along with supplemental oxygen per nasal cannula.   10/14 in preparation for bone marrow biopsy he received sedatives and became hypoxic, requiring noninvasive mechanical ventilation.   He developed urinary retention and Foley catheter was placed.   Patient progressive rapid decline in overall health. 10/19 signs of recurrent and worsening respiratory failure.  He and his family decided to transition to comfort measures.  Final Diagnoses:  Acute on chronic hypoxemic respiratory failure, acute hypercapnic respiratory failure, community acquired pneumonia (present on admission).  2. Pancytopenia, neutropenic fever, oral thrush.   3. Pulmonary fibrosis/ COPD.  4. Urinary retention, BPH.   5. Obesity class 2 . Dyslipidemia.   6. Depression, chronic back pain.   7. MAT.   8. Deep tissue injury right buttocks present on admission.      The results of significant diagnostics from this hospitalization (including imaging, microbiology, ancillary and laboratory) are listed below for reference.    Significant Diagnostic Studies: DG CHEST PORT 1 VIEW  Result Date: 07/13/2021 CLINICAL DATA:  Worsening cough thus morning EXAM: PORTABLE CHEST 1 VIEW COMPARISON:  Chest radiograph 07/10/2021 FINDINGS: The heart is enlarged, unchanged. The mediastinum is prominent, also not significantly changed and likely exaggerated by AP technique. There are hazy opacities in the left base with increased silhouetting of the left hemidiaphragm. Small bilateral pleural  effusions are again suspected. There is no new or worsening focal airspace disease.  There is no overt pulmonary edema. There is no pneumothorax. There is no acute osseous abnormality. IMPRESSION: 1. Increased opacity in the left base with silhouetting of the left hemidiaphragm may reflect worsening atelectasis or developing infection. 2. Unchanged small bilateral pleural effusions. Electronically Signed   By: Valetta Mole M.D.   On: 07/13/2021 12:44   DG CHEST PORT 1 VIEW  Result Date: 07/10/2021 CLINICAL DATA:  Low O2 sats EXAM: PORTABLE CHEST 1 VIEW COMPARISON:  Chest radiograph 07/06/2021 FINDINGS: The heart is enlarged, unchanged. The mediastinal contours are stable. Patchy opacities are again seen in the bases. There are small bilateral pleural effusions. There is vascular congestion without definite overt pulmonary edema. There is no pneumothorax. The bones are stable. IMPRESSION: Changed patchy opacities in the lung bases and small bilateral pleural effusions. Electronically Signed   By: Valetta Mole M.D.   On: 07/10/2021 12:29   DG Chest Port 1 View  Result Date: 07/06/2021 CLINICAL DATA:  Tachycardia, fever, dyspnea, history of prostate cancer EXAM: PORTABLE CHEST 1 VIEW COMPARISON:  07/14/2021 FINDINGS: Single frontal view of the chest demonstrates stable enlargement the cardiac silhouette. Continued ectasia of the thoracic aorta. Bibasilar airspace disease, left greater than right, concerning for pneumonia. Small effusions are suspected. No pneumothorax. No acute bony abnormalities. IMPRESSION: 1. Patchy bibasilar airspace disease and likely trace effusions, concerning for pneumonia. Electronically Signed   By: Randa Ngo M.D.   On: 07/06/2021 19:11   DG Chest Port 1 View  Result Date: 07/26/2021 CLINICAL DATA:  Questionable sepsis. EXAM: PORTABLE CHEST 1 VIEW COMPARISON:  July 13, 2018 FINDINGS: The heart size and mediastinal contours are stable. There is no focal infiltrate or  pulmonary edema. Probable minimal right pleural effusion is identified. Mild linear scar is identified in the left lung base. The visualized skeletal structures are unremarkable. IMPRESSION: No focal pneumonia. Probable minimal right pleural effusion. Electronically Signed   By: Abelardo Diesel M.D.   On: 07/01/2021 11:19    Microbiology: Recent Results (from the past 240 hour(s))  MRSA Next Gen by PCR, Nasal     Status: None   Collection Time: 07/09/21 10:18 AM   Specimen: Nasal Mucosa; Nasal Swab  Result Value Ref Range Status   MRSA by PCR Next Gen NOT DETECTED NOT DETECTED Final    Comment: (NOTE) The GeneXpert MRSA Assay (FDA approved for NASAL specimens only), is one component of a comprehensive MRSA colonization surveillance program. It is not intended to diagnose MRSA infection nor to guide or monitor treatment for MRSA infections. Test performance is not FDA approved in patients less than 67 years old. Performed at [facility], [place] [address] [place], [place] [address]   SARS CORONAVIRUS 2 (TAT 6-24 HRS) Nasopharyngeal Nasopharyngeal Swab     Status: None   Collection Time: July 24, 2021  5:53 AM   Specimen: Nasopharyngeal Swab  Result Value Ref Range Status   SARS Coronavirus 2 NEGATIVE NEGATIVE Final    Comment: (NOTE) SARS-CoV-2 target nucleic acids are NOT DETECTED.  The SARS-CoV-2 RNA is generally detectable in upper and lower respiratory specimens during the acute phase of infection. Negative results do not preclude SARS-CoV-2 infection, do not rule out co-infections with other pathogens, and should not be used as the sole basis for treatment or other patient management decisions. Negative results must be combined with clinical observations, patient history, and epidemiological information. The expected result is Negative.  Fact Sheet for Patients: SugarRoll.be  Fact Sheet for Healthcare  Providers: https://www.woods-mathews.com/  This test is not yet approved or cleared by the [place] and  has been authorized for detection and/or diagnosis of SARS-CoV-2 by FDA under an Emergency Use Authorization (EUA). This EUA will remain  in effect (meaning this test can be used) for the duration of the COVID-19 declaration under Se ction 564(b)(1) of the Act, 21 U.S.C. section 360bbb-3(b)(1), unless the authorization is terminated or revoked sooner.  Performed at [facility], [place] [address], [place], [place] [address]      Labs: Basic Metabolic Panel: Recent Labs  Lab 07/11/21 0253 07/12/21 0239 07/13/21 0455 07/14/21 0411 July 17, 2021 0434  NA 136 137 136 137 136  K 3.9 3.9 3.4* 4.1 3.8  CL 95* 96* 96* 95* 94*  CO2 34* 33* 33* 34* 34*  GLUCOSE 81 67* 100* 96 109*  BUN _0 CREATININE 0.60* 0.63 0.58* 0.55* 0.66  CALCIUM 8.4* 8.2* 8.3* 8.6* 8.7*  MG  --   --  2.0  --   --    Liver Function Tests: No results for input(s): AST, ALT, ALKPHOS, BILITOT, PROT, ALBUMIN in the last 168 hours. No results for input(s): LIPASE, AMYLASE in the last 168 hours. No results for input(s): AMMONIA in the last 168 hours. CBC: Recent Labs  Lab 07/11/21 0755 07/12/21 0239 07/13/21 0455 07/14/21 0411 07-17-2021 0434  WBC 4.9 5.9 7.4 7.8 8.6  NEUTROABS 1.8 2.4 2.7 3.0 2.4  HGB 9.8* 9.7* 10.3* 9.9* 10.3*  HCT 30.1* 30.8* 31.8* 32.0* 32.9*  MCV 117.1* 118.5* 119.1* 121.2* 120.5*  PLT 337 404* 457* 490* 561*   Cardiac Enzymes: No results for input(s): CKTOTAL, CKMB, CKMBINDEX, TROPONINI in the last 168 hours. D-Dimer No results for input(s): DDIMER in the last 72 hours. BNP: Invalid input(s): POCBNP CBG: Recent Labs  Lab 07/13/21 1622 07/13/21 2042 07/14/21 0747 07/14/21 1833 07-17-21 0736  GLUCAP 111* 164* 84 144* 110*   Anemia work up No results for input(s): VITAMINB12, FOLATE, FERRITIN, TIBC, IRON, RETICCTPCT in the last 72  hours. Urinalysis    Component Value Date/Time   COLORURINE YELLOW 07/25/2021 1905   APPEARANCEUR CLEAR 07/08/2021 1905   LABSPEC 1.005 07/10/2021 1905   PHURINE 6.0 07/25/2021 1905   GLUCOSEU NEGATIVE 07/26/2021 [place] NEGATIVE 07/22/2021 1905   BILIRUBINUR NEGATIVE 06/30/2021 [place] 07/09/2021 1905   PROTEINUR NEGATIVE 07/20/2021 1905   NITRITE NEGATIVE 07/02/2021 1905   LEUKOCYTESUR NEGATIVE 07/07/2021 1905   Sepsis Labs Invalid input(s): PROCALCITONIN,  WBC,  LACTICIDVEN     SIGNED:  Tawni Millers, MD  [facility] 07/16/2021, 5:24 PM Pager   If 7PM-7AM, please contact night-coverage [email or web address] Password [email or web address]

## 2021-07-28 NOTE — Progress Notes (Signed)
    OVERNIGHT PROGRESS REPORT  Notified by RN that patient has expired at 2049  Patient was comfort care and followed by Palliative.  2 RN verified.  Family was available to RN.     Gershon Cull MSNA ACNPC-AG Acute Care Nurse Practitioner [place]

## 2021-07-28 NOTE — Progress Notes (Signed)
OT Cancellation Note  Patient Details Name: [name] MRN: [id number] DOB: [birth year]   Cancelled Treatment:    Reason Eval/Treat Not Completed: Other (comment). RN reports patient now comfort care. OT will sign off. Please reorder OT services if goals of care change.   Rukiya Hodgkins L Kayli Beal 08/01/21, 1:09 PM

## 2021-07-28 NOTE — Progress Notes (Signed)
Procedure cancelled prior to documentation on IR Narrator, therefore V/S documented directly on Flowsheet.

## 2021-07-28 NOTE — Progress Notes (Signed)
Family called RN to bedside to assess patient's O2 level. PRN Morphine given at 1024 for SOB and pain to right hip/back. At this time, patient restless and not following commands. Family at bedside. O2 Sats 78% on 4L/[place]. Family requested to increased oxygen level so that patient is able to stay alert to say goodbye to other family coming to visit. NRB placed on pt at 15L at this time. Pt appears less restless, resting comfortably, still shallow breathing and tachypneic. Will continue to monitor closely for needs.

## 2021-07-28 NOTE — Plan of Care (Signed)
  Problem: Clinical Measurements: Goal: Will remain free from infection Outcome: Progressing Goal: Respiratory complications will improve Outcome: Progressing   Problem: Nutrition: Goal: Adequate nutrition will be maintained Outcome: Progressing   Problem: Pain Managment: Goal: General experience of comfort will improve Outcome: Progressing

## 2021-07-28 NOTE — Progress Notes (Signed)
Pt has deceased. This RN and Macario Carls, RN verified pt has no pulse  and is not breathing. Pt time of death is November 09, 2047. Notified family members at bedside. NP Olena Heckle made aware.

## 2021-07-28 NOTE — Progress Notes (Signed)
OT Cancellation Note  Patient Details Name: [name] MRN: [id number] DOB: 02-28-1941   Cancelled Treatment:    Reason Eval/Treat Not Completed: Other (comment). RN requests therapist hold for now. Family requesting hospice and GOC need to be established. Will follow up as able to see if patient wanting and agreeable to therapy.  [name] [name] 07-17-21, 9:00 AM

## 2021-07-28 NOTE — Progress Notes (Signed)
PROGRESS NOTE    [name]  DXA:[id number] DOB: 19-Apr-1941 DOA: 07/07/2021 PCP: Javier Glazier, MD    Brief Narrative:  [name] was admitted to the hospital with the working diagnosis of acute hypoxemic and hypercapnic respiratory failure, in the setting of community-acquired pneumonia and pancytopenia.   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  He reported 10 days of throat pain, white plaques involving her oral mucosa with ulcers, positive cough and dyspnea.  His symptoms were refractive to outpatient therapy with clotrimazole atrocious, azithromycin and prednisone.  On his initial physical examination he was febrile 38.9 C, his oxygen saturation was in the 90s on 4 L supplemental oxygen per nasal cannula, heart rate 75, respiratory rate 20, blood pressure 125/78.  He had decreased breath sounds bilaterally, heart S1-S2, present, rhythmic, soft abdomen, no lower extremity edema.  Patient was alert and oriented.  Sodium 134, potassium 3.0, chloride 96, bicarb 29, glucose 95, BUN 20, creatinine 0.87, white count 1.5, hemoglobin 10.3, hematocrit 30.1, platelets 33. SARS COVID-19 negative.  Urinalysis specific gravity 1.005, negative nitrates.  His chest radiograph showed bilateral lobe atelectasis, small right pleural effusion.  EKG 94 bpm, left axis deviation, left anterior fascicular block, normal intervals, sinus rhythm with PACs, no significant ST segment or T wave changes.  Patient was placed on antibiotic therapy with azithromycin and ceftriaxone along with supplemental oxygen per nasal cannula.  10/14 in preparation for bone marrow biopsy  he received sedatives and became hypoxic, requiring noninvasive mechanical ventilation.  He developed urinary retention and Foley catheter was placed.  Patient progressive rapid decline in overall health. 10/19 signs of recurrent and worsening respiratory failure.  He and his family decided to transition to comfort measures.  Assessment & Plan:   Principal Problem:   Febrile leukopenia Active Problems:   GOLD COPD II B   OSA on CPAP   Hypoxia   Rheumatoid arthritis involving multiple sites with positive rheumatoid factor (HCC)   Thrush   ILD (interstitial lung disease) (HCC)   Neutropenic fever (HCC)   Pancytopenia (HCC)   Chronic lower back pain   Elevated transaminase level   Depression   Pressure injury of skin   Acute on chronic hypoxemic respiratory failure, acute hypercapnic respiratory failure, community acquired pneumonia (present on admission).  Patient with increase work of breathing and decreased in mentation, worse than yesterday, his wife is at the bedside. Considering patient's advanced medical problems and poor prognosis he and his wife have decided to continue care under comfort measures and will like be referred to hospice.   Plan to continue supplemental 02 per [place] for comfort. Add as needed morphine, lorazepam and haloperidol for comfort measures per protocol.   2. Pancytopenia, neutropenic fever, oral thrush. Patient with immunosuppression, on admission poor prognosis.   His cell counts have improved. No further work up.   3. Pulmonary fibrosis/ COPD. Continue supplemental 02 per comfort. Continue with as needed bronchodilator therapy.   4. Urinary retention, BPH. Patient has a foley catheter that will be kept in place for comfort.   5. Obesity class 2 . Dyslipidemia. Calculated BMI is 35.12 Discontinue statin therapy, patient now on comfort measures.   6. Depression, chronic back pain. Continue with sertraline, cymbalta and trazodone.  7. MAT. Will  continue metoprolol for now to prevent tachycardia    8. Deep tissue injury right buttocks present on admission.   Status is: Inpatient  Remains inpatient appropriate because: imminent risk of worsening respiratory failre     DVT prophylaxis:  Scd   Code Status:    DNR   Family Communication:  I spoke with patient's wife at the bedside, we talked in detail about patient's condition, plan of care and prognosis and all questions were addressed.    Skin Documentation: Pressure Injury 07/10/21 Buttocks Right;Medial Deep Tissue Pressure Injury - Purple or maroon localized area of discolored intact skin or blood-filled blister due to damage of underlying soft tissue from pressure and/or shear. reb, but blanchable (Active)  07/10/21 1200  Location: Buttocks  Location Orientation: Right;Medial  Staging: Deep Tissue Pressure Injury - Purple or maroon localized area of discolored intact skin or blood-filled blister due to damage of underlying soft tissue from pressure and/or shear.  Wound Description (Comments): reb, but blanchable  Present on Admission: Yes     Consultants:  Hematology Palliative of care     Subjective: Patient is somnolent this am, positive moderate dyspnea and back pain/ headache, no nausea or vomiting. Limited history due to somnolence.   Objective: Vitals:   07/14/21 1242 07/14/21 2046 2021/07/29 0538 July 29, 2021 0719  BP: 128/75 137/74 127/66   Pulse: 79 (!) 103 89   Resp: _0 Temp: 98.1 F (36.7 C) 99.5 F (37.5 C) 98.4 F (36.9 C)   TempSrc: Oral     SpO2: 96% 93% 99% 97%  Weight:      Height:        Intake/Output Summary (Last 24 hours) at July 29, 2021 0852 Last data filed at 07/29/2021 0500 Gross per 24 hour  Intake 120 ml  Output 1300 ml  Net -1180 ml   Filed Weights   06/30/2021 1141 07/27/2021 2009 07/10/21 1152  Weight: 100.2 kg 100.3 kg 101.7 kg    Examination:   General: deconditioned and ill looking appearing  Neurology: somnolent,  responds to touch and answers to simple question  E ENT: no pallor, no icterus, oral mucosa dry. Increase work of breathing with increase use of accessory muscles.  Cardiovascular: No JVD. S1-S2 present, rhythmic, no gallops, rubs, or murmurs. No lower extremity edema. Pulmonary: positive breath sounds bilaterally,with no wheezing, but bilateral rhonchi or rales. Gastrointestinal. Abdomen soft and non tender Skin. No rashes Musculoskeletal: no joint deformities     Data Reviewed: I have personally reviewed following labs and imaging studies  CBC: Recent Labs  Lab 07/11/21 0755 07/12/21 0239 07/13/21 0455 07/14/21 0411 2021-07-29 0434  WBC 4.9 5.9 7.4 7.8 8.6  NEUTROABS 1.8 2.4 2.7 3.0 2.4  HGB 9.8* 9.7* 10.3* 9.9* 10.3*  HCT 30.1* 30.8* 31.8* 32.0* 32.9*  MCV 117.1* 118.5* 119.1* 121.2* 120.5*  PLT 337 404* 457* 490* 858*   Basic Metabolic Panel: Recent Labs  Lab 07/09/21 0329 07/10/21 0042 07/11/21 0253 07/12/21 0239 07/13/21 0455 07/14/21 0411 2021-07-29 0434  NA 133*   < > 136 137 136 137 136  K 4.0   < > 3.9 3.9 3.4* 4.1 3.8  CL 98   < > 95* 96* 96* 95* 94*  CO2 28   < > 34* 33* 33* 34* 34*  GLUCOSE 126*   < > 81 67* 100* 96 109*  BUN 12   < > _1 CREATININE 0.61   < >  0.60* 0.63 0.58* 0.55* 0.66  CALCIUM 8.6*   < > 8.4* 8.2* 8.3* 8.6* 8.7*  MG 2.0  --   --   --  2.0  --   --    < > = values in this interval not displayed.   GFR: Estimated Creatinine Clearance: 85 mL/min (by C-G formula based on SCr of 0.66 mg/dL). Liver Function Tests: No results for input(s): AST, ALT, ALKPHOS, BILITOT, PROT, ALBUMIN in the last 168 hours. No results for input(s): LIPASE, AMYLASE in the last 168 hours. No results for input(s): AMMONIA in the last 168 hours. Coagulation Profile: No results for input(s): INR, PROTIME in the last 168 hours. Cardiac Enzymes: No results for input(s): CKTOTAL, CKMB, CKMBINDEX, TROPONINI in the last 168 hours. BNP (last 3 results) No  results for input(s): PROBNP in the last 8760 hours. HbA1C: No results for input(s): HGBA1C in the last 72 hours. CBG: Recent Labs  Lab 07/13/21 1622 07/13/21 2042 07/14/21 0747 07/14/21 1833 07-Aug-2021 0736  GLUCAP 111* 164* 84 144* 110*   Lipid Profile: No results for input(s): CHOL, HDL, LDLCALC, TRIG, CHOLHDL, LDLDIRECT in the last 72 hours. Thyroid Function Tests: No results for input(s): TSH, T4TOTAL, FREET4, T3FREE, THYROIDAB in the last 72 hours. Anemia Panel: No results for input(s): VITAMINB12, FOLATE, FERRITIN, TIBC, IRON, RETICCTPCT in the last 72 hours.    Radiology Studies: I have reviewed all of the imaging during this hospital visit personally     Scheduled Meds:  chlorhexidine  15 mL Mouth Rinse BID   Chlorhexidine Gluconate Cloth  6 each Topical Daily   dronabinol  2.5 mg Oral BID AC   DULoxetine  60 mg Oral QPM   finasteride  5 mg Oral QHS   fluticasone furoate-vilanterol  1 puff Inhalation Daily   ipratropium-albuterol  3 mL Nebulization BID   metoprolol tartrate  50 mg Oral BID   polyethylene glycol  17 g Oral Daily   senna-docusate  2 tablet Oral BID   sertraline  100 mg Oral Daily   tamsulosin  0.4 mg Oral QHS   traZODone  50 mg Oral QHS   Continuous Infusions:  sodium chloride 10 mL/hr (07/07/21 1900)     LOS: 10 days        Christionna Poland Gerome Apley, MD

## 2021-07-28 NOTE — Consult Note (Signed)
Consultation Note Date: 08/06/21   Patient Name: [name]  DOB: [birth year]  MRN: [id number]  Age / Sex: 80 y.o., male  PCP: Javier Glazier, MD Referring Physician: Tawni Millers  Reason for Consultation:   HPI/Patient Profile: 80 y.o. male  with past medical history of splenectomy, rheumatoid arthritis, pulmonary fibrosis, interstitial lung disease, COPD, obstructive sleep apnea, hypertension, atrial tachycardia cardia, chronic pain syndrome, spinal surgery for vertebral osteomyelitis, admitted on 07/04/2021 with acute on chronic hypoxemic respiratory failure related to pneumonia, pancytopenia.  There were plans for a bone marrow but this was canceled due to hypoxemia and his counts improved on their own.  His recovery was complicated by significant deconditioning.  Yesterday he was preparing for discharge to rehab however he had significant change in his respiratory status.  Per discussion with primary team family made decision to transition to comfort measures only.  Primary Decision Maker-spouse-Cherry    Discussion: I met at the bedside with patient's spouse, his children, and grandchildren. 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  He is known to be silly and enjoy laughing.  He has had a difficult time ever since he was 80 years old with medical complications that was whenever he had his splenectomy. Cherry notes that [name] has had ongoing decline in his health over the last few years.  She shares that recently within the past few months he has been telling her that he has lived a good life, he has wonderful children and grandchildren, and that he could not ask for more.  She is peaceful with her decision to honor Jere's wishes of not prolonging a quality of life that he would not want.  Her hope is that he does not suffer and that  he is able to die peacefully surrounded by his family. We discussed interventions that can be used to maintain patient's comfort.  Discussed with family signs to look for of discomfort and that they may request medication from nursing if he does appear to be uncomfortable.  Currently he has a nonrebreather as well as a nasal cannula on.  His son shares that he asked that this be placed in order to allow time for there for his grandson to arrive, however they do not wish to prolong this high amount of oxygen as they understand this could unnecessarily prolong the dying process.  Disposition was discussed.  Based on my exam I am concerned that he is not stable enough for discharge.  Family is pleased to have him remain here for care for the duration.  SUMMARY OF RECOMMENDATIONS -Full comfort measures only -Morphine 55m q4hrs IV scheduled -Morphine [id number] for any signs of discomfort or agitation -Other comfort interventions as ordered    Code Status/Advance Care Planning: DNR   Prognosis:   Hours - Days  Discharge Planning: Anticipated Hospital Death  Primary Diagnoses: Present on Admission:  Pancytopenia (HCCExmoreRheumatoid arthritis involving multiple sites with positive  rheumatoid factor (HCC)  GOLD COPD II B  ILD (interstitial lung disease) (HCC)  Chronic lower back pain  Hypoxia  Thrush  Elevated transaminase level  Depression  Neutropenic fever ([place])   Review of Systems  Unable to perform ROS: Acuity of condition   Physical Exam Vitals and nursing note reviewed.  Constitutional:      General: He is in acute distress.     Appearance: He is ill-appearing and diaphoretic.  Cardiovascular:     Rate and Rhythm: Tachycardia present.     Comments: Radial pulses normal, pedal pulses weak, diffuse anasarca Pulmonary:     Comments: Shallow breaths Neurological:     Comments: Unresponsive to voice or touch    Vital Signs: BP 127/66 (BP Location: Left Arm)   Pulse 89    Temp 98.4 F (36.9 C)   Resp 18   Ht _0  (1.702 m)   Wt 101.7 kg   SpO2 97%   BMI 35.12 kg/m  Pain Scale: Faces POSS *See Group Information*: 1-Acceptable,Awake and alert Pain Score: Asleep   SpO2: SpO2: 97 % O2 Device:SpO2: 97 % O2 Flow Rate: .O2 Flow Rate (L/min): 4 L/min  IO: Intake/output summary:  Intake/Output Summary (Last 24 hours) at Jul 31, 2021 1438 Last data filed at Jul 31, 2021 0500 Gross per 24 hour  Intake 120 ml  Output 1300 ml  Net -1180 ml    LBM: Last BM Date: 07/09/21 (per patient) Baseline Weight: Weight: 100.2 kg Most recent weight: Weight: 101.7 kg     Palliative Assessment/Data: PPS: 10%       Thank you for this consult. Palliative medicine will continue to follow and assist as needed.   Time In: 1332 Time Out: 1456 Time Total: 84 mins Greater than 50%  of this time was spent counseling and coordinating care related to the above assessment and plan.  Signed by: Mariana Kaufman, AGNP-C Palliative Medicine    Please contact Palliative Medicine Team phone at [phone] for questions and concerns.  For individual provider: See Shea Evans

## 2021-07-28 DEATH — deceased

## 2022-02-18 IMAGING — DX DG CHEST 1V PORT
1 series · 1 of 1 positions shown · non-contrast
Comparison: July 13, 2018

CLINICAL DATA: Questionable sepsis.

EXAM:
PORTABLE CHEST 1 VIEW

[chest ap]
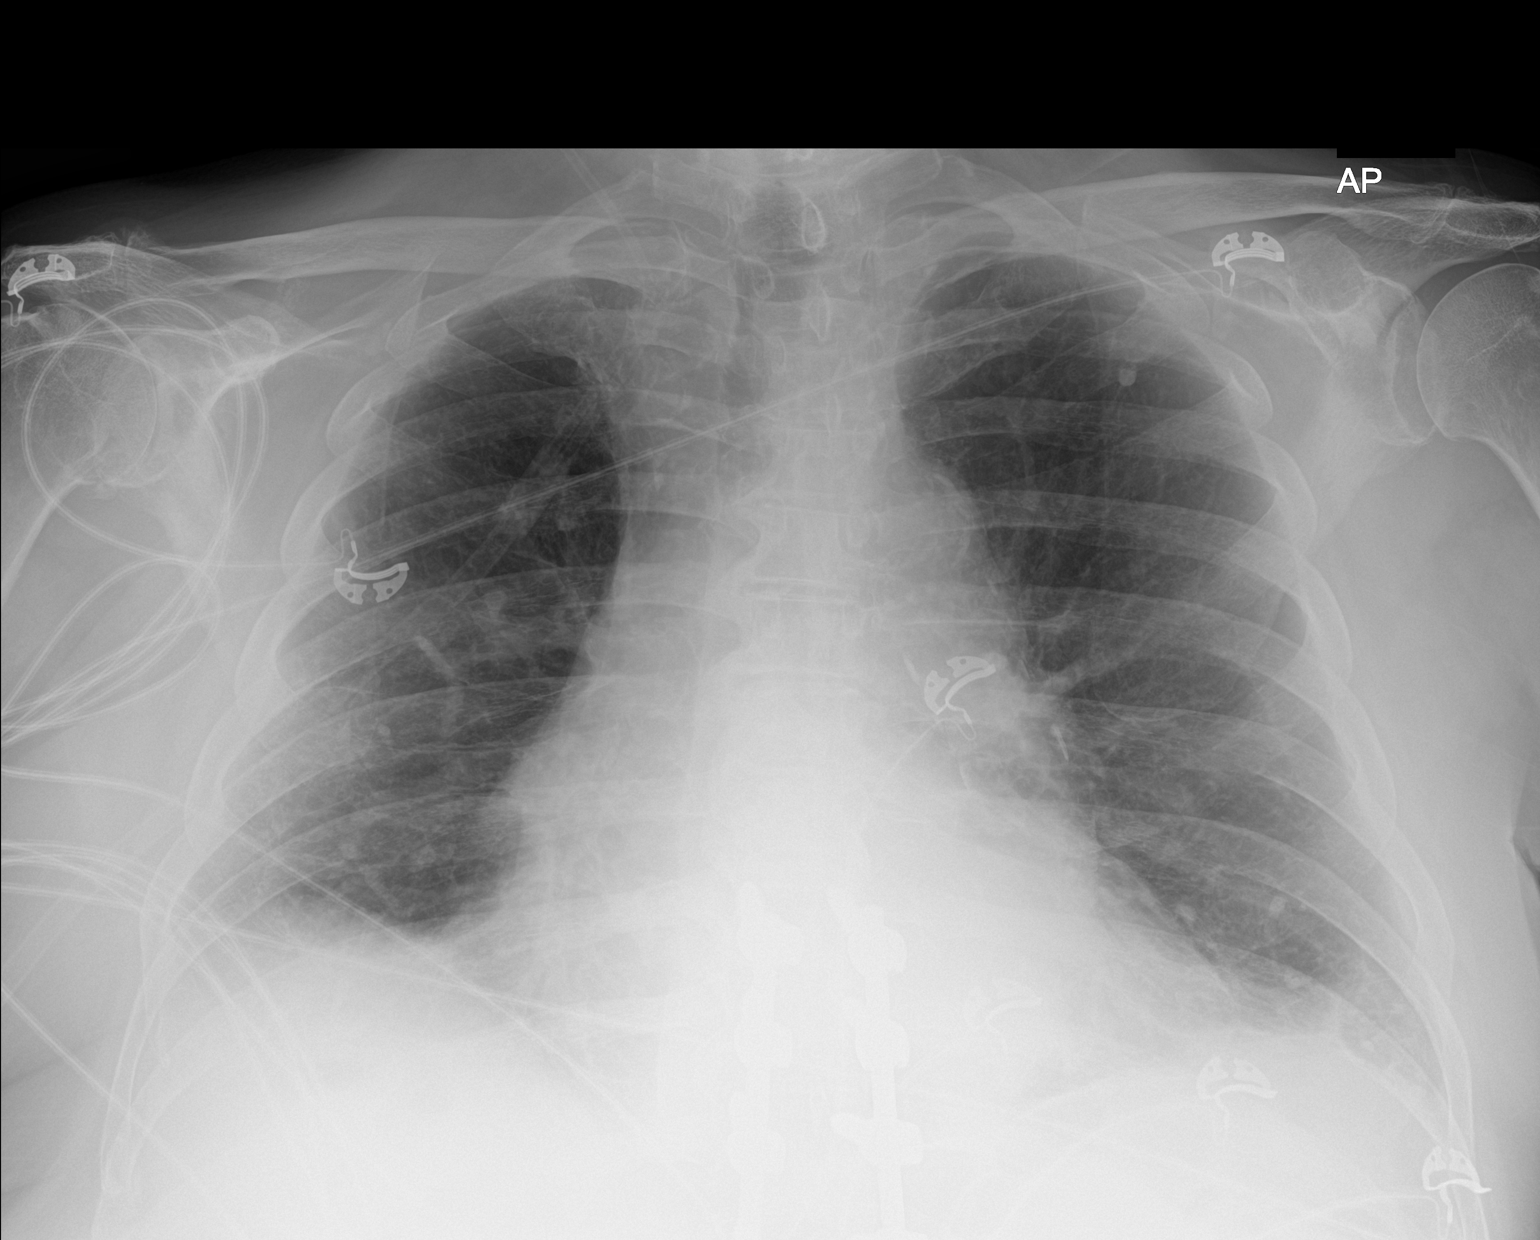

[1 of 1 positions shown; findings below may reference images not displayed]

FINDINGS: The heart size and mediastinal contours are stable. There is no
focal infiltrate or pulmonary edema. Probable minimal right pleural
effusion is identified. Mild linear scar is identified in the left
lung base. The visualized skeletal structures are unremarkable.
IMPRESSION: No focal pneumonia. Probable minimal right pleural effusion.

## 2022-02-19 IMAGING — DX DG CHEST 1V PORT
1 series · 1 of 1 positions shown · non-contrast
Comparison: 07/05/2021

CLINICAL DATA: Tachycardia, fever, dyspnea, history of prostate
cancer

EXAM:
PORTABLE CHEST 1 VIEW

[chest ap]
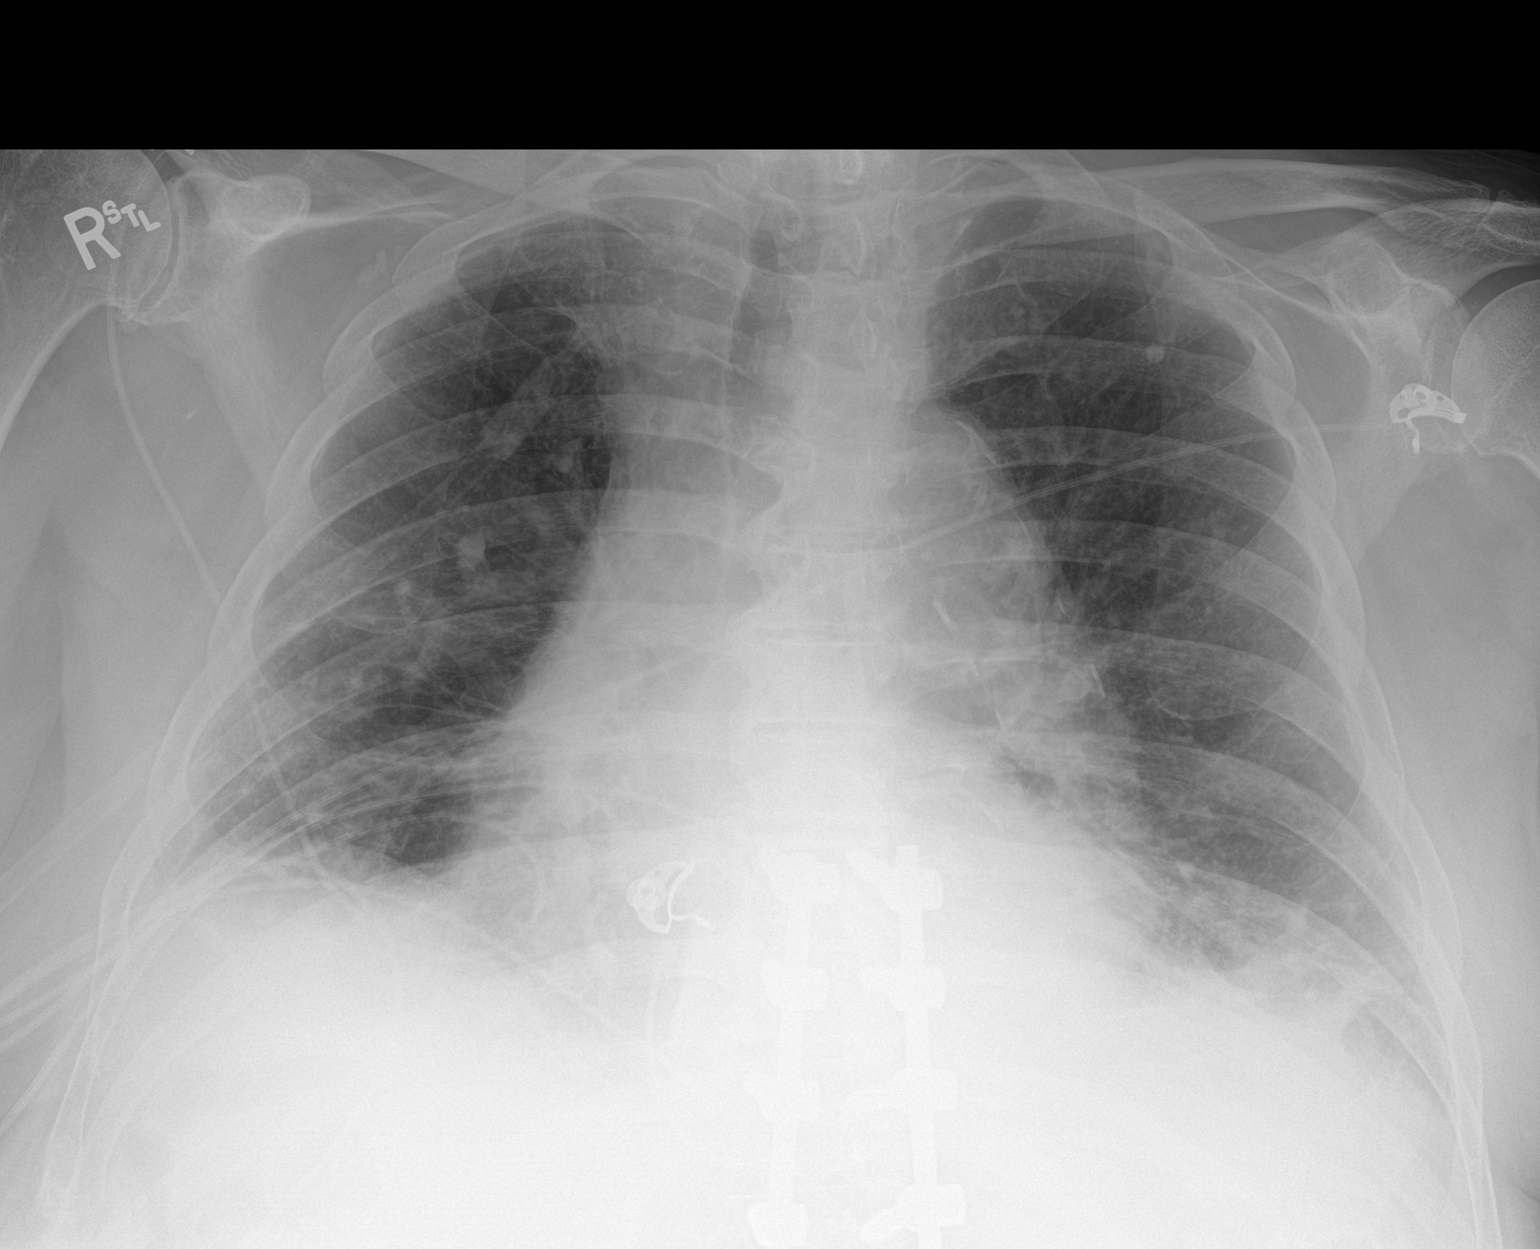

[1 of 1 positions shown; findings below may reference images not displayed]

FINDINGS: Single frontal view of the chest demonstrates stable enlargement the
cardiac silhouette. Continued ectasia of the thoracic aorta.
Bibasilar airspace disease, left greater than right, concerning for
pneumonia. Small effusions are suspected. No pneumothorax. No acute
bony abnormalities.
IMPRESSION: 1. Patchy bibasilar airspace disease and likely trace effusions,
concerning for pneumonia.
